# Patient Record
Sex: Male | Born: 1945
Health system: Southern US, Community
[De-identification: ages and names within clinical notes are randomized; demographics above are authoritative.]

## PROBLEM LIST (undated history)

## (undated) DIAGNOSIS — I10 Essential (primary) hypertension: Secondary | ICD-10-CM

## (undated) DIAGNOSIS — E785 Hyperlipidemia, unspecified: Secondary | ICD-10-CM

## (undated) DIAGNOSIS — C801 Malignant (primary) neoplasm, unspecified: Secondary | ICD-10-CM

## (undated) DIAGNOSIS — E119 Type 2 diabetes mellitus without complications: Secondary | ICD-10-CM

## (undated) HISTORY — PX: EYE SURGERY: SHX253

---

## 2015-09-05 DIAGNOSIS — E785 Hyperlipidemia, unspecified: Secondary | ICD-10-CM | POA: Diagnosis not present

## 2015-09-05 DIAGNOSIS — E1142 Type 2 diabetes mellitus with diabetic polyneuropathy: Secondary | ICD-10-CM | POA: Diagnosis not present

## 2015-09-05 DIAGNOSIS — N529 Male erectile dysfunction, unspecified: Secondary | ICD-10-CM | POA: Diagnosis not present

## 2015-09-05 DIAGNOSIS — I10 Essential (primary) hypertension: Secondary | ICD-10-CM | POA: Diagnosis not present

## 2016-03-15 DIAGNOSIS — K219 Gastro-esophageal reflux disease without esophagitis: Secondary | ICD-10-CM | POA: Diagnosis not present

## 2016-03-15 DIAGNOSIS — E119 Type 2 diabetes mellitus without complications: Secondary | ICD-10-CM | POA: Diagnosis not present

## 2016-03-15 DIAGNOSIS — E785 Hyperlipidemia, unspecified: Secondary | ICD-10-CM | POA: Diagnosis not present

## 2016-03-15 DIAGNOSIS — I10 Essential (primary) hypertension: Secondary | ICD-10-CM | POA: Diagnosis not present

## 2016-05-10 ENCOUNTER — Ambulatory Visit (HOSPITAL_COMMUNITY)
Admission: EM | Admit: 2016-05-10 | Discharge: 2016-05-10 | Disposition: A | Discharge status: Home / Self Care | Payer: Managed Care, Other (non HMO) | Attending: Internal Medicine | Admitting: Internal Medicine

## 2016-05-10 ENCOUNTER — Encounter (HOSPITAL_COMMUNITY): Payer: Self-pay | Admitting: *Deleted

## 2016-05-10 DIAGNOSIS — Z794 Long term (current) use of insulin: Secondary | ICD-10-CM

## 2016-05-10 DIAGNOSIS — E119 Type 2 diabetes mellitus without complications: Secondary | ICD-10-CM

## 2016-05-10 DIAGNOSIS — Z760 Encounter for issue of repeat prescription: Secondary | ICD-10-CM | POA: Diagnosis not present

## 2016-05-10 HISTORY — DX: Type 2 diabetes mellitus without complications: E11.9

## 2016-05-10 HISTORY — DX: Essential (primary) hypertension: I10

## 2016-05-10 MED ORDER — INSULIN ASPART 100 UNIT/ML ~~LOC~~ SOLN: 20.0000 [IU] | Freq: Three times a day (TID) | SUBCUTANEOUS | 3 refills | Status: DC

## 2016-05-10 MED ORDER — ENALAPRIL MALEATE 10 MG PO TABS: 10.0000 mg | ORAL_TABLET | Freq: Every day | ORAL | 3 refills | Status: DC

## 2016-05-10 MED ORDER — INSULIN DETEMIR 100 UNIT/ML ~~LOC~~ SOLN: 25.0000 [IU] | Freq: Every day | SUBCUTANEOUS | 3 refills | Status: DC

## 2016-05-10 NOTE — ED Triage Notes (Signed)
Pt   Reports      Symptoms  Of  Needing a  Medication  Refill      He recently  Relocated from  [place]    He  Needs   A     Refill  On  His  Insulin

## 2016-05-10 NOTE — ED Provider Notes (Signed)
[facility]    CSN: [id number] Arrival date & time: 05/10/16  1002     History   Chief Complaint Chief Complaint  Patient presents with  . Medication Refill    HPI Stephen Lynch is a 71 y.o. male.   Pt presents w/o complaints.  He is new to the area and has not established care with PMD yet.  Needs refills on BP meds and insulin.  Blood sugars always under 120 in the mornings (usually around 100).  Admits that he needs to cut out eating dessert late in the evening.  Reports recent A1c good.      Past Medical History:  Diagnosis Date  . Diabetes mellitus without complication ([place])   . Hypertension     There are no active problems to display for this patient.   History reviewed. No pertinent surgical history.     Home Medications    Prior to Admission medications   Medication Sig Start Date End Date Taking? Authorizing Provider  rosuvastatin (CRESTOR) 10 MG tablet Take 10 mg by mouth daily.   Yes Historical Provider, MD  enalapril (VASOTEC) 10 MG tablet Take 1 tablet (10 mg total) by mouth daily. 05/10/16   Harrie Foreman, MD  insulin aspart (NOVOLOG) 100 UNIT/ML injection Inject 20 Units into the skin 3 (three) times daily with meals. 05/10/16   Harrie Foreman, MD  insulin detemir (LEVEMIR) 100 UNIT/ML injection Inject 0.25 mLs (25 Units total) into the skin at bedtime. 05/10/16   Harrie Foreman, MD    Family History History reviewed. No pertinent family history.  Social History Social History  Substance Use Topics  . Smoking status: Never Smoker  . Smokeless tobacco: Not on file  . Alcohol use No     Allergies   Patient has no known allergies.   Review of Systems Review of Systems  Constitutional: Negative for chills and fever.  HENT: Negative for sore throat and tinnitus.   Eyes: Negative for redness.  Respiratory: Negative for cough and shortness of breath.   Cardiovascular: Negative for chest pain and palpitations.    Gastrointestinal: Negative for abdominal pain, diarrhea, nausea and vomiting.  Genitourinary: Negative for dysuria, frequency and urgency.  Musculoskeletal: Negative for myalgias.  Skin: Negative for rash.       No lesions  Neurological: Negative for weakness.  Hematological: Does not bruise/bleed easily.  Psychiatric/Behavioral: Negative for suicidal ideas.     Physical Exam Triage Vital Signs ED Triage Vitals  Enc Vitals Group     BP 05/10/16 1027 140/76     Pulse Rate 05/10/16 1027 78     Resp 05/10/16 1027 18     Temp 05/10/16 1027 98.6 F (37 C)     Temp Source 05/10/16 1027 Oral     SpO2 05/10/16 1027 100 %     Weight --      Height --      Head Circumference --      Peak Flow --      Pain Score 05/10/16 1137 0     Pain Loc --      Pain Edu? --      Excl. in [place]? --    No data found.   Updated Vital Signs BP 140/76 (BP Location: Right Arm)   Pulse 78   Temp 98.6 F (37 C) (Oral)   Resp 18   SpO2 100%   Visual Acuity Right Eye Distance:   Left Eye Distance:  Bilateral Distance:    Right Eye Near:   Left Eye Near:    Bilateral Near:     Physical Exam  Constitutional: He is oriented to person, place, and time. He appears well-developed and well-nourished. No distress.  HENT:  Head: Normocephalic and atraumatic.  Mouth/Throat: Oropharynx is clear and moist.  Eyes: Conjunctivae and EOM are normal. Pupils are equal, round, and reactive to light. No scleral icterus.  Neck: Normal range of motion. Neck supple. No JVD present. No tracheal deviation present. No thyromegaly present.  Cardiovascular: Normal rate, regular rhythm and normal heart sounds.  Exam reveals no gallop and no friction rub.   No murmur heard. Pulmonary/Chest: Effort normal and breath sounds normal. No respiratory distress.  Abdominal: Soft. Bowel sounds are normal. He exhibits no distension. There is no tenderness.  Musculoskeletal: Normal range of motion. He exhibits no edema.   Lymphadenopathy:    He has no cervical adenopathy.  Neurological: He is alert and oriented to person, place, and time. No cranial nerve deficit.  Skin: Skin is warm and dry. No rash noted. No erythema.  Psychiatric: He has a normal mood and affect. His behavior is normal. Judgment and thought content normal.     UC Treatments / Results  Labs (all labs ordered are listed, but only abnormal results are displayed) Labs Reviewed - No data to display  EKG  EKG Interpretation None       Radiology No results found.  Procedures Procedures (including critical care time)  Medications Ordered in UC Medications - No data to display   Initial Impression / Assessment and Plan / UC Course  I have reviewed the triage vital signs and the nursing notes.  Pertinent labs & imaging results that were available during my care of the patient were reviewed by me and considered in my medical decision making (see chart for details).     Refilled medications.  Urged pt to establish care soon.  Final Clinical Impressions(s) / UC Diagnoses   Final diagnoses:  Medication refill  Controlled type 2 diabetes mellitus without complication, with long-term current use of insulin ([place])    New Prescriptions Discharge Medication List as of 05/10/2016 11:36 AM    START taking these medications   Details  insulin detemir (LEVEMIR) 100 UNIT/ML injection Inject 0.25 mLs (25 Units total) into the skin at bedtime., Starting Fri 05/10/2016, Normal         Harrie Foreman, MD 05/24/16 1715

## 2016-06-05 ENCOUNTER — Encounter (HOSPITAL_COMMUNITY): Payer: Self-pay | Admitting: *Deleted

## 2016-06-05 ENCOUNTER — Ambulatory Visit (HOSPITAL_COMMUNITY)
Admission: EM | Admit: 2016-06-05 | Discharge: 2016-06-05 | Disposition: A | Discharge status: Home / Self Care | Payer: Managed Care, Other (non HMO) | Attending: Family Medicine | Admitting: Family Medicine

## 2016-06-05 DIAGNOSIS — Z760 Encounter for issue of repeat prescription: Secondary | ICD-10-CM

## 2016-06-05 MED ORDER — INSULIN ASPART 100 UNIT/ML ~~LOC~~ SOLN: 20.0000 [IU] | Freq: Three times a day (TID) | SUBCUTANEOUS | 3 refills | Status: DC

## 2016-06-05 MED ORDER — ROSUVASTATIN CALCIUM 10 MG PO TABS: 10.0000 mg | ORAL_TABLET | Freq: Every day | ORAL | 3 refills | Status: DC

## 2016-06-05 MED ORDER — INSULIN DETEMIR 100 UNIT/ML ~~LOC~~ SOLN: 25.0000 [IU] | Freq: Every day | SUBCUTANEOUS | 3 refills | Status: DC

## 2016-06-05 MED ORDER — ENALAPRIL MALEATE 10 MG PO TABS: 10.0000 mg | ORAL_TABLET | Freq: Every day | ORAL | 3 refills | Status: DC

## 2016-06-05 NOTE — ED Provider Notes (Signed)
CSN: [id number]     Arrival date & time 06/05/16  1151 History   First MD Initiated Contact with Patient 06/05/16 1250     Chief Complaint  Patient presents with  . Medication Refill   (Consider location/radiation/quality/duration/timing/severity/associated sxs/prior Treatment) 71 year old male presents to clinic for a chief complaint of needing medicine refilled. He hasn't no acute complaints or any physical signs or symptoms of illness. He was last seen on March 30, and at that visit his insulin and blood pressure medicines were refilled. Today's presenting for request of having his cholesterol medicine refilled. He is relatively new to the area moving unit from [place], and has had some difficulty finding a primary care provider. He is long established on these medicines, with no known allergies, reactions, or other complaints. States his A1c was normal, and he has had no issues with hypoglycemia, low blood pressure, or myalgias.   The history is provided by the patient.  Medication Refill  Medications/supplies requested:  Crestor Reason for request:  Clinic/provider not available Medications taken before: yes - see home medications   Patient has complete original prescription information: no   Source of information:  Patient reported - no other verification available (medicine bottles)   Past Medical History:  Diagnosis Date  . Diabetes mellitus without complication ([place])   . Hypertension    History reviewed. No pertinent surgical history. History reviewed. No pertinent family history. Social History  Substance Use Topics  . Smoking status: Never Smoker  . Smokeless tobacco: Never Used  . Alcohol use No    Review of Systems  Constitutional: Negative for chills and fever.  HENT: Negative for congestion, sinus pain, sinus pressure and sore throat.   Eyes: Negative for discharge, itching and visual disturbance.  Respiratory: Negative for shortness of breath and wheezing.    Cardiovascular: Negative for chest pain and palpitations.  Gastrointestinal: Negative for diarrhea, nausea and vomiting.  Genitourinary: Negative for dysuria and frequency.  Musculoskeletal: Negative for myalgias, neck pain and neck stiffness.  Skin: Negative for rash and wound.  Neurological: Negative for dizziness, light-headedness and headaches.    Allergies  Patient has no known allergies.  Home Medications   Prior to Admission medications   Medication Sig Start Date End Date Taking? Authorizing Provider  enalapril (VASOTEC) 10 MG tablet Take 1 tablet (10 mg total) by mouth daily. 06/05/16   Barnet Glasgow, NP  insulin aspart (NOVOLOG) 100 UNIT/ML injection Inject 20 Units into the skin 3 (three) times daily with meals. 06/05/16   Barnet Glasgow, NP  insulin detemir (LEVEMIR) 100 UNIT/ML injection Inject 0.25 mLs (25 Units total) into the skin at bedtime. 06/05/16   Barnet Glasgow, NP  rosuvastatin (CRESTOR) 10 MG tablet Take 1 tablet (10 mg total) by mouth daily. 06/05/16   Barnet Glasgow, NP   Meds Ordered and Administered this Visit  Medications - No data to display  BP (!) 143/74 (BP Location: Left Arm)   Pulse 60   Temp 97.7 F (36.5 C) (Oral)   Resp 17   SpO2 100%  No data found.   Physical Exam  Constitutional: He is oriented to person, place, and time. He appears well-developed and well-nourished. No distress.  HENT:  Head: Normocephalic and atraumatic.  Right Ear: External ear normal.  Left Ear: External ear normal.  Eyes: Conjunctivae are normal. Right eye exhibits no discharge. Left eye exhibits no discharge.  Neck: Normal range of motion.  Cardiovascular: Normal rate and regular rhythm.   Pulmonary/Chest:  Effort normal and breath sounds normal.  Neurological: He is alert and oriented to person, place, and time.  Skin: Skin is warm and dry. Capillary refill takes less than 2 seconds. He is not diaphoretic.  Psychiatric: He has a normal mood and affect.  His behavior is normal.  Nursing note and vitals reviewed.   Urgent Care Course     Procedures (including critical care time)  Labs Review Labs Reviewed - No data to display  Imaging Review No results found.     MDM   1. Medication refill     Refills provided on his medicines, contact information was given to a clinic currently accepting new patients, and he was shown the number to call to establish with primary care. Strongly encouraged following up with primary care as soon as possible for the management of his condition.    Barnet Glasgow, NP 06/05/16 1410

## 2016-06-05 NOTE — ED Triage Notes (Addendum)
Patient just relocated to [facility] from [place], needs refill on his prescriptions for insulin and cholesterol medication. Would also like help on how to get another PCP. Patient has not complaints and states he is feeling good. Is almost out of insulin medication but has not been without. Patient has prescription refill for him ready for his BP medication.

## 2016-08-14 ENCOUNTER — Emergency Department (HOSPITAL_COMMUNITY)
Admission: EM | Admit: 2016-08-14 | Discharge: 2016-08-14 | Disposition: A | Discharge status: Home / Self Care | Payer: Medicare Other | Attending: Emergency Medicine | Admitting: Emergency Medicine

## 2016-08-14 ENCOUNTER — Encounter (HOSPITAL_COMMUNITY): Payer: Self-pay

## 2016-08-14 DIAGNOSIS — E119 Type 2 diabetes mellitus without complications: Secondary | ICD-10-CM | POA: Insufficient documentation

## 2016-08-14 DIAGNOSIS — I10 Essential (primary) hypertension: Secondary | ICD-10-CM | POA: Insufficient documentation

## 2016-08-14 DIAGNOSIS — Z79899 Other long term (current) drug therapy: Secondary | ICD-10-CM | POA: Insufficient documentation

## 2016-08-14 DIAGNOSIS — Z794 Long term (current) use of insulin: Secondary | ICD-10-CM | POA: Diagnosis not present

## 2016-08-14 DIAGNOSIS — R600 Localized edema: Secondary | ICD-10-CM | POA: Diagnosis present

## 2016-08-14 DIAGNOSIS — K047 Periapical abscess without sinus: Secondary | ICD-10-CM

## 2016-08-14 MED ORDER — HYDROCODONE-ACETAMINOPHEN 5-325 MG PO TABS: 1.0000 | ORAL_TABLET | ORAL | 0 refills | Status: DC | PRN

## 2016-08-14 MED ORDER — PENICILLIN V POTASSIUM 500 MG PO TABS: 500.0000 mg | ORAL_TABLET | Freq: Two times a day (BID) | ORAL | 0 refills | Status: AC

## 2016-08-14 NOTE — ED Triage Notes (Signed)
Patient complains of upper left dental pain with facial swelling x 3 days. Has ben taking ibuprofen with minimal relief

## 2016-08-14 NOTE — ED Provider Notes (Signed)
[facility] DEPT Provider Note   CSN: [id number] Arrival date & time: 08/14/16  1410     History   Chief Complaint Chief Complaint  Patient presents with  . dental abscess    HPI Stephen Lynch is a 71 y.o. male.  HPI   Patient is a 71 year old male with history of diabetes who presents the ED with complaint of dental abscess. Patient reports having gradually worsening pain and swelling to his left upper face. Denies fever, drainage, dysphagia, trismus, drooling, SOB, vomiting. Denies taking any medications at home for pain.   Past Medical History:  Diagnosis Date  . Diabetes mellitus without complication ([place])   . Hypertension     There are no active problems to display for this patient.   History reviewed. No pertinent surgical history.     Home Medications    Prior to Admission medications   Medication Sig Start Date End Date Taking? Authorizing Provider  enalapril (VASOTEC) 10 MG tablet Take 1 tablet (10 mg total) by mouth daily. 06/05/16   Barnet Glasgow, NP  HYDROcodone-acetaminophen (NORCO/VICODIN) 5-325 MG tablet Take 1 tablet by mouth every 4 (four) hours as needed. 08/14/16   Nona Dell, PA-C  insulin aspart (NOVOLOG) 100 UNIT/ML injection Inject 20 Units into the skin 3 (three) times daily with meals. 06/05/16   Barnet Glasgow, NP  insulin detemir (LEVEMIR) 100 UNIT/ML injection Inject 0.25 mLs (25 Units total) into the skin at bedtime. 06/05/16   Barnet Glasgow, NP  penicillin v potassium (VEETID) 500 MG tablet Take 1 tablet (500 mg total) by mouth 2 (two) times daily. 08/14/16 08/21/16  Nona Dell, PA-C  rosuvastatin (CRESTOR) 10 MG tablet Take 1 tablet (10 mg total) by mouth daily. 06/05/16   Barnet Glasgow, NP    Family History No family history on file.  Social History Social History  Substance Use Topics  . Smoking status: Never Smoker  . Smokeless tobacco: Never Used  . Alcohol use No     Allergies   Patient  has no known allergies.   Review of Systems Review of Systems  Constitutional: Negative for fever.  HENT: Positive for dental problem and facial swelling. Negative for trouble swallowing.   Respiratory: Negative for shortness of breath.   Gastrointestinal: Negative for vomiting.  Neurological: Negative for headaches.     Physical Exam Updated Vital Signs BP (!) 146/80   Pulse 71   Temp 98.2 F (36.8 C) (Oral)   Resp 16   Ht 6\' 1"  (1.854 m)   Wt 104.3 kg (230 lb)   SpO2 95%   BMI 30.34 kg/m   Physical Exam  Constitutional: He is oriented to person, place, and time. He appears well-developed and well-nourished.  HENT:  Head: Normocephalic and atraumatic.  Mouth/Throat: Uvula is midline, oropharynx is clear and moist and mucous membranes are normal. No trismus in the jaw. Abnormal dentition. Dental abscesses and dental caries present. No uvula swelling. No oropharyngeal exudate, posterior oropharyngeal edema, posterior oropharyngeal erythema or tonsillar abscesses. No tonsillar exudate.    Poor dentition throughout with mild swelling to left maxillary facial region. No trismus or drooling.   Eyes: Conjunctivae and EOM are normal. Right eye exhibits no discharge. Left eye exhibits no discharge. No scleral icterus.  Neck: Normal range of motion. Neck supple.  Cardiovascular: Normal rate and intact distal pulses.   Pulmonary/Chest: Effort normal.  Musculoskeletal: Normal range of motion.  Neurological: He is alert and oriented to person, place, and time.  Skin:  Skin is warm and dry.  Nursing note and vitals reviewed.    ED Treatments / Results  Labs (all labs ordered are listed, but only abnormal results are displayed) Labs Reviewed - No data to display  EKG  EKG Interpretation None       Radiology No results found.  Procedures Procedures (including critical care time)  Medications Ordered in ED Medications - No data to display   Initial Impression /  Assessment and Plan / ED Course  I have reviewed the triage vital signs and the nursing notes.  Pertinent labs & imaging results that were available during my care of the patient were reviewed by me and considered in my medical decision making (see chart for details).     Patient presented with dental abscess open and actively draining on exam. Denies fever, trismus, drooling, vomiting. VSS. No concern for Ludwig's angina or spread of infection. Plan to d/c pt home with penicillin, pain meds and NSAIDs along with outpatient dental referral. Discussed strict return precautions.   Final Clinical Impressions(s) / ED Diagnoses   Final diagnoses:  Dental abscess    New Prescriptions New Prescriptions   HYDROCODONE-ACETAMINOPHEN (NORCO/VICODIN) 5-325 MG TABLET    Take 1 tablet by mouth every 4 (four) hours as needed.   PENICILLIN V POTASSIUM (VEETID) 500 MG TABLET    Take 1 tablet (500 mg total) by mouth 2 (two) times daily.     Nona Dell, PA-C 08/14/16 [place], DO 08/14/16 1556

## 2016-08-14 NOTE — Discharge Instructions (Signed)
Take medications as prescribed. You may also take 600 mg ibuprofen 4 times daily as needed for pain relief. I recommend eating prior to taking ibuprofen to prevent gastrointestinal side effects. You may apply ice and/or heat to affected area for 15-20 minutes 3-4 times daily to help with pain. Follow-up with one of the dental clinics listed below for further management of your dental pain. Return to the emergency department if symptoms worsen or new onset of fever, headache, neck stiffness, facial/neck swelling, unable to open jaw, unable to swallow resulting in drooling, difficulty breathing, drainage, unable to tolerate fluids.   [facility] [address] [place], [place] [address] Phone [phone]  [facility] [place], [place], exemplifies the [employer]?s vision to improve the health and quality of life of all [place] by Regulatory affairs officer with a passion to care for the underserved and by leading the nation in community-based, service learning oral health education.  We are committed to offering comprehensive general dental services for adults, children and special needs patients in a safe, caring and professional setting.   Appointments: Our clinic is open Monday through Friday 8:00 a.m. until 5:00 p.m. The amount of time scheduled for an appointment depends on the patient?s specific needs. We ask that you keep your appointed time for care or provide 24-hour notice of all appointment changes. Parents or legal guardians must accompany minor children.   Payment for Services: Medicaid and other insurance plans are welcome. Payment for services is due when services are rendered and may be made by cash or credit card. If you have dental insurance, we will assist you with your claim submission.      Emergencies:  Emergency services will be provided Monday through Friday on a walk-in basis.  Please arrive early for emergency services. After hours emergency services will be provided for patients of record as required.   Services:  Comprehensive General Dentistry Children?s Dentistry Oral Surgery - Extractions Root Canals Sealants and Tooth Colored Fillings Crowns and Camera operator 3-D/[facility]

## 2016-08-26 ENCOUNTER — Encounter (HOSPITAL_COMMUNITY): Payer: Self-pay | Admitting: *Deleted

## 2016-08-26 ENCOUNTER — Ambulatory Visit (HOSPITAL_COMMUNITY)
Admission: EM | Admit: 2016-08-26 | Discharge: 2016-08-26 | Disposition: A | Discharge status: Home / Self Care | Payer: Managed Care, Other (non HMO) | Attending: Internal Medicine | Admitting: Internal Medicine

## 2016-08-26 DIAGNOSIS — Z760 Encounter for issue of repeat prescription: Secondary | ICD-10-CM | POA: Diagnosis not present

## 2016-08-26 DIAGNOSIS — E119 Type 2 diabetes mellitus without complications: Secondary | ICD-10-CM | POA: Diagnosis not present

## 2016-08-26 MED ORDER — INSULIN ASPART 100 UNIT/ML ~~LOC~~ SOLN: 20.0000 [IU] | Freq: Three times a day (TID) | SUBCUTANEOUS | 0 refills | Status: DC

## 2016-08-26 MED ORDER — INSULIN DETEMIR 100 UNIT/ML ~~LOC~~ SOLN: 25.0000 [IU] | Freq: Every day | SUBCUTANEOUS | 0 refills | Status: DC

## 2016-08-26 NOTE — ED Triage Notes (Signed)
Pt  Is  Requesting   A   Prescription  Refill  On  His  humulog  20   Units   3   X    A  Day  He  Has  A  Small  Amt  Left

## 2016-08-26 NOTE — Discharge Instructions (Signed)
You will need to establish PCP for further medication refills.

## 2016-08-26 NOTE — ED Provider Notes (Signed)
CSN: [id number]     Arrival date & time 08/26/16  1529 History   None    Chief Complaint  Patient presents with  . Medication Refill   (Consider location/radiation/quality/duration/timing/severity/associated sxs/prior Treatment) 71 year old male with PMH of diabetes, hypertension, hyperlipidemia comes in for medication refill. Patient states he moved down from [place], and has been having trouble establishing care at a PCP and endocrinologist. He states his endocrinologist back in [place] had switched locations, causing the difficulty of transferring files. He denies any symptoms such as chest pain, palpitations, shortness of breath, abdominal pain, N/V/D. States his blood sugars have been running 120-150s.       Past Medical History:  Diagnosis Date  . Diabetes mellitus without complication ([place])   . Hypertension    History reviewed. No pertinent surgical history. History reviewed. No pertinent family history. Social History  Substance Use Topics  . Smoking status: Never Smoker  . Smokeless tobacco: Never Used  . Alcohol use No    Review of Systems  Constitutional: Negative for chills, diaphoresis and fever.  Respiratory: Negative for cough, shortness of breath and wheezing.   Cardiovascular: Negative for chest pain, palpitations and leg swelling.  Gastrointestinal: Negative for abdominal pain, constipation, diarrhea, nausea and vomiting.  Endocrine: Negative for polydipsia, polyphagia and polyuria.    Allergies  Patient has no known allergies.  Home Medications   Prior to Admission medications   Medication Sig Start Date End Date Taking? Authorizing Provider  enalapril (VASOTEC) 10 MG tablet Take 1 tablet (10 mg total) by mouth daily. 06/05/16   Barnet Glasgow, NP  HYDROcodone-acetaminophen (NORCO/VICODIN) 5-325 MG tablet Take 1 tablet by mouth every 4 (four) hours as needed. 08/14/16   Nona Dell, PA-C  insulin aspart (NOVOLOG) 100 UNIT/ML injection Inject 20 Units  into the skin 3 (three) times daily with meals. 08/26/16 09/02/16  Tasia Catchings, Jeferson Boozer V, PA-C  insulin detemir (LEVEMIR) 100 UNIT/ML injection Inject 0.25 mLs (25 Units total) into the skin at bedtime. 08/26/16 09/02/16  Tasia Catchings, Mirta Mally V, PA-C  rosuvastatin (CRESTOR) 10 MG tablet Take 1 tablet (10 mg total) by mouth daily. 06/05/16   Barnet Glasgow, NP   Meds Ordered and Administered this Visit  Medications - No data to display  BP 136/66 (BP Location: Right Arm)   Pulse 63   Temp 98.6 F (37 C) (Oral)   Resp 18  No data found.   Physical Exam  Constitutional: He is oriented to person, place, and time. He appears well-developed and well-nourished. No distress.  HENT:  Head: Normocephalic and atraumatic.  Eyes: Pupils are equal, round, and reactive to light. Conjunctivae are normal.  Cardiovascular: Normal rate, regular rhythm and normal heart sounds.  Exam reveals no gallop and no friction rub.   No murmur heard. Pulmonary/Chest: Effort normal and breath sounds normal. He has no wheezes. He has no rales.  Neurological: He is alert and oriented to person, place, and time.  Skin: Skin is warm and dry.  Psychiatric: He has a normal mood and affect. His behavior is normal. Judgment normal.    Urgent Care Course     Procedures (including critical care time)  Labs Review Labs Reviewed - No data to display  Imaging Review No results found.        MDM   1. Medication refill    Upon review of records, patient has been seen twice for medication refill starting back in March. Discussed with patient that though I understand the difficulty  of finding an endocrinologist/PCP, medications he is taking need to be monitored, as well as lab work that needs to be done. As he has not had full DM workup for at least 4 months, will refill medication for 7 days. Additional resources for PCP given to patient. Patient expresses understanding and agrees to plan.    Ok Edwards, PA-C 08/26/16 1921

## 2016-09-25 ENCOUNTER — Encounter (HOSPITAL_COMMUNITY): Payer: Self-pay | Admitting: *Deleted

## 2016-09-25 ENCOUNTER — Encounter (HOSPITAL_COMMUNITY): Payer: Self-pay | Admitting: Family Medicine

## 2016-09-25 ENCOUNTER — Ambulatory Visit (HOSPITAL_COMMUNITY)
Admission: EM | Admit: 2016-09-25 | Discharge: 2016-09-25 | Disposition: A | Discharge status: Home / Self Care | Payer: Medicare Other | Attending: Family Medicine | Admitting: Family Medicine

## 2016-09-25 DIAGNOSIS — Z794 Long term (current) use of insulin: Secondary | ICD-10-CM

## 2016-09-25 DIAGNOSIS — E784 Other hyperlipidemia: Secondary | ICD-10-CM

## 2016-09-25 DIAGNOSIS — E119 Type 2 diabetes mellitus without complications: Secondary | ICD-10-CM

## 2016-09-25 DIAGNOSIS — I10 Essential (primary) hypertension: Secondary | ICD-10-CM

## 2016-09-25 DIAGNOSIS — E7849 Other hyperlipidemia: Secondary | ICD-10-CM

## 2016-09-25 DIAGNOSIS — Z760 Encounter for issue of repeat prescription: Secondary | ICD-10-CM | POA: Diagnosis not present

## 2016-09-25 MED ORDER — ENALAPRIL MALEATE 10 MG PO TABS: 10.0000 mg | ORAL_TABLET | Freq: Every day | ORAL | 3 refills | Status: DC

## 2016-09-25 MED ORDER — INSULIN DETEMIR 100 UNIT/ML FLEXPEN: 25.0000 [IU] | PEN_INJECTOR | Freq: Every day | SUBCUTANEOUS | 11 refills | Status: DC

## 2016-09-25 MED ORDER — ROSUVASTATIN CALCIUM 10 MG PO TABS: 10.0000 mg | ORAL_TABLET | Freq: Every day | ORAL | 3 refills | Status: AC

## 2016-09-25 MED ORDER — HUMALOG JUNIOR KWIKPEN 100 UNIT/ML ~~LOC~~ SOPN: 20.0000 [IU] | PEN_INJECTOR | Freq: Three times a day (TID) | SUBCUTANEOUS | 6 refills | Status: DC

## 2016-09-25 NOTE — ED Provider Notes (Signed)
[place]    CSN: [id number] Arrival date & time: 09/25/16  1059     History   Chief Complaint No chief complaint on file.   HPI Stephen Lynch is a 71 y.o. male.   This is a 71 year old gentleman who comes in because his right out of his medicine for diabetes, hypertension, and hyperlipidemia. He was seen last month and given one week of medication. He was instructed to find a personal care provider at the time of the last visit.  He does have an appointment on August 24 for new doctor. His blood sugars been running under 150. He prefers the pens for his Letemir and Humalog.      Past Medical History:  Diagnosis Date  . Diabetes mellitus without complication ([place])   . Hypertension     There are no active problems to display for this patient.   History reviewed. No pertinent surgical history.     Home Medications    Prior to Admission medications   Medication Sig Start Date End Date Taking? Authorizing Provider  enalapril (VASOTEC) 10 MG tablet Take 1 tablet (10 mg total) by mouth daily. 09/25/16   [name], MD  Insulin Detemir (LEVEMIR) 100 UNIT/ML Pen Inject 25 Units into the skin daily at 10 pm. 09/25/16   [name], MD  insulin lispro (HUMALOG) 100 UNIT/ML KwikPen Junior Inject 0.2 mLs (20 Units total) into the skin 3 (three) times daily after meals. 09/25/16   [name], MD  rosuvastatin (CRESTOR) 10 MG tablet Take 1 tablet (10 mg total) by mouth daily. 09/25/16   [name], MD    Family History No family history on file.  Social History Social History  Substance Use Topics  . Smoking status: Never Smoker  . Smokeless tobacco: Never Used  . Alcohol use No     Allergies   Patient has no known allergies.   Review of Systems Review of Systems  All other systems reviewed and are negative.    Physical Exam Triage Vital Signs ED Triage Vitals  Enc Vitals Group     BP      Pulse      Resp      Temp    Temp src      SpO2      Weight      Height      Head Circumference      Peak Flow      Pain Score      Pain Loc      Pain Edu?      Excl. in [place]?    No data found.   Updated Vital Signs There were no vitals taken for this visit.   Physical Exam  Constitutional: He is oriented to person, place, and time. He appears well-developed and well-nourished.  HENT:  Right Ear: External ear normal.  Left Ear: External ear normal.  Eyes: Pupils are equal, round, and reactive to light. Conjunctivae are normal.  Neck: Normal range of motion. Neck supple.  Pulmonary/Chest: Effort normal.  Musculoskeletal: Normal range of motion.  Neurological: He is alert and oriented to person, place, and time.  Skin: Skin is warm and dry.  Nursing note and vitals reviewed.    UC Treatments / Results  Labs (all labs ordered are listed, but only abnormal results are displayed) Labs Reviewed - No data to display  EKG  EKG Interpretation None       Radiology No results found.  Procedures Procedures (  including critical care time)  Medications Ordered in UC Medications - No data to display   Initial Impression / Assessment and Plan / UC Course  I have reviewed the triage vital signs and the nursing notes.  Pertinent labs & imaging results that were available during my care of the patient were reviewed by me and considered in my medical decision making (see chart for details).     Final Clinical Impressions(s) / UC Diagnoses   Final diagnoses:  Essential hypertension  Insulin dependent type 2 diabetes mellitus, controlled (HCC)  Other hyperlipidemia    New Prescriptions New Prescriptions   INSULIN DETEMIR (LEVEMIR) 100 UNIT/ML PEN    Inject 25 Units into the skin daily at 10 pm.   INSULIN LISPRO (HUMALOG) 100 UNIT/ML KWIKPEN JUNIOR    Inject 0.2 mLs (20 Units total) into the skin 3 (three) times daily after meals.     Controlled Substance Prescriptions [place] Controlled Substance  Registry consulted? Not Applicable   [name], MD 09/25/16 1136

## 2016-09-25 NOTE — ED Triage Notes (Signed)
Pt needed   A  Refill  Of  His  Prescription      meds    He  Is  Out

## 2016-10-04 DIAGNOSIS — Z6835 Body mass index (BMI) 35.0-35.9, adult: Secondary | ICD-10-CM | POA: Diagnosis not present

## 2016-10-04 DIAGNOSIS — I10 Essential (primary) hypertension: Secondary | ICD-10-CM | POA: Diagnosis not present

## 2016-10-11 DIAGNOSIS — M130 Polyarthritis, unspecified: Secondary | ICD-10-CM | POA: Diagnosis not present

## 2016-10-11 DIAGNOSIS — E119 Type 2 diabetes mellitus without complications: Secondary | ICD-10-CM | POA: Diagnosis not present

## 2016-10-11 DIAGNOSIS — I10 Essential (primary) hypertension: Secondary | ICD-10-CM | POA: Diagnosis not present

## 2016-10-11 DIAGNOSIS — E785 Hyperlipidemia, unspecified: Secondary | ICD-10-CM | POA: Diagnosis not present

## 2016-12-12 DIAGNOSIS — Z961 Presence of intraocular lens: Secondary | ICD-10-CM | POA: Diagnosis not present

## 2016-12-12 DIAGNOSIS — E119 Type 2 diabetes mellitus without complications: Secondary | ICD-10-CM | POA: Diagnosis not present

## 2016-12-17 DIAGNOSIS — E119 Type 2 diabetes mellitus without complications: Secondary | ICD-10-CM | POA: Diagnosis not present

## 2016-12-17 DIAGNOSIS — M130 Polyarthritis, unspecified: Secondary | ICD-10-CM | POA: Diagnosis not present

## 2016-12-17 DIAGNOSIS — E785 Hyperlipidemia, unspecified: Secondary | ICD-10-CM | POA: Diagnosis not present

## 2016-12-17 DIAGNOSIS — I10 Essential (primary) hypertension: Secondary | ICD-10-CM | POA: Diagnosis not present

## 2016-12-31 DIAGNOSIS — L601 Onycholysis: Secondary | ICD-10-CM | POA: Diagnosis not present

## 2016-12-31 DIAGNOSIS — E119 Type 2 diabetes mellitus without complications: Secondary | ICD-10-CM | POA: Diagnosis not present

## 2016-12-31 DIAGNOSIS — M130 Polyarthritis, unspecified: Secondary | ICD-10-CM | POA: Diagnosis not present

## 2016-12-31 DIAGNOSIS — I10 Essential (primary) hypertension: Secondary | ICD-10-CM | POA: Diagnosis not present

## 2017-03-31 DIAGNOSIS — E785 Hyperlipidemia, unspecified: Secondary | ICD-10-CM | POA: Diagnosis not present

## 2017-03-31 DIAGNOSIS — E119 Type 2 diabetes mellitus without complications: Secondary | ICD-10-CM | POA: Diagnosis not present

## 2017-03-31 DIAGNOSIS — I10 Essential (primary) hypertension: Secondary | ICD-10-CM | POA: Diagnosis not present

## 2017-03-31 DIAGNOSIS — L601 Onycholysis: Secondary | ICD-10-CM | POA: Diagnosis not present

## 2017-04-17 DIAGNOSIS — E118 Type 2 diabetes mellitus with unspecified complications: Secondary | ICD-10-CM | POA: Diagnosis not present

## 2017-04-17 DIAGNOSIS — I10 Essential (primary) hypertension: Secondary | ICD-10-CM | POA: Diagnosis not present

## 2017-04-24 DIAGNOSIS — E1165 Type 2 diabetes mellitus with hyperglycemia: Secondary | ICD-10-CM | POA: Diagnosis not present

## 2017-05-20 DIAGNOSIS — E1165 Type 2 diabetes mellitus with hyperglycemia: Secondary | ICD-10-CM | POA: Diagnosis not present

## 2017-05-20 DIAGNOSIS — I10 Essential (primary) hypertension: Secondary | ICD-10-CM | POA: Diagnosis not present

## 2017-06-10 DIAGNOSIS — E1165 Type 2 diabetes mellitus with hyperglycemia: Secondary | ICD-10-CM | POA: Diagnosis not present

## 2017-06-10 DIAGNOSIS — I10 Essential (primary) hypertension: Secondary | ICD-10-CM | POA: Diagnosis not present

## 2017-06-10 DIAGNOSIS — E785 Hyperlipidemia, unspecified: Secondary | ICD-10-CM | POA: Diagnosis not present

## 2017-07-10 DIAGNOSIS — M130 Polyarthritis, unspecified: Secondary | ICD-10-CM | POA: Diagnosis not present

## 2017-07-10 DIAGNOSIS — G250 Essential tremor: Secondary | ICD-10-CM | POA: Diagnosis not present

## 2017-07-10 DIAGNOSIS — E785 Hyperlipidemia, unspecified: Secondary | ICD-10-CM | POA: Diagnosis not present

## 2017-07-10 DIAGNOSIS — L601 Onycholysis: Secondary | ICD-10-CM | POA: Diagnosis not present

## 2017-07-10 DIAGNOSIS — R37 Sexual dysfunction, unspecified: Secondary | ICD-10-CM | POA: Diagnosis not present

## 2017-07-10 DIAGNOSIS — E118 Type 2 diabetes mellitus with unspecified complications: Secondary | ICD-10-CM | POA: Diagnosis not present

## 2017-08-01 DIAGNOSIS — E1165 Type 2 diabetes mellitus with hyperglycemia: Secondary | ICD-10-CM | POA: Diagnosis not present

## 2017-09-29 ENCOUNTER — Ambulatory Visit: Payer: Medicare Other | Admitting: Neurology

## 2017-12-24 DIAGNOSIS — I10 Essential (primary) hypertension: Secondary | ICD-10-CM | POA: Diagnosis not present

## 2017-12-24 DIAGNOSIS — Z6834 Body mass index (BMI) 34.0-34.9, adult: Secondary | ICD-10-CM | POA: Diagnosis not present

## 2017-12-24 DIAGNOSIS — E1165 Type 2 diabetes mellitus with hyperglycemia: Secondary | ICD-10-CM | POA: Diagnosis not present

## 2017-12-24 DIAGNOSIS — M130 Polyarthritis, unspecified: Secondary | ICD-10-CM | POA: Diagnosis not present

## 2018-01-27 DIAGNOSIS — I10 Essential (primary) hypertension: Secondary | ICD-10-CM | POA: Diagnosis not present

## 2018-01-27 DIAGNOSIS — M130 Polyarthritis, unspecified: Secondary | ICD-10-CM | POA: Diagnosis not present

## 2018-01-27 DIAGNOSIS — Z6835 Body mass index (BMI) 35.0-35.9, adult: Secondary | ICD-10-CM | POA: Diagnosis not present

## 2018-01-27 DIAGNOSIS — E785 Hyperlipidemia, unspecified: Secondary | ICD-10-CM | POA: Diagnosis not present

## 2018-01-27 DIAGNOSIS — L601 Onycholysis: Secondary | ICD-10-CM | POA: Diagnosis not present

## 2018-01-27 DIAGNOSIS — R37 Sexual dysfunction, unspecified: Secondary | ICD-10-CM | POA: Diagnosis not present

## 2018-01-27 DIAGNOSIS — E118 Type 2 diabetes mellitus with unspecified complications: Secondary | ICD-10-CM | POA: Diagnosis not present

## 2018-02-26 DIAGNOSIS — E119 Type 2 diabetes mellitus without complications: Secondary | ICD-10-CM | POA: Diagnosis not present

## 2018-02-26 DIAGNOSIS — Z961 Presence of intraocular lens: Secondary | ICD-10-CM | POA: Diagnosis not present

## 2018-03-30 DIAGNOSIS — E1100 Type 2 diabetes mellitus with hyperosmolarity without nonketotic hyperglycemic-hyperosmolar coma (NKHHC): Secondary | ICD-10-CM | POA: Diagnosis not present

## 2018-03-30 DIAGNOSIS — I10 Essential (primary) hypertension: Secondary | ICD-10-CM | POA: Diagnosis not present

## 2018-04-28 DIAGNOSIS — E785 Hyperlipidemia, unspecified: Secondary | ICD-10-CM | POA: Diagnosis not present

## 2018-04-28 DIAGNOSIS — E1165 Type 2 diabetes mellitus with hyperglycemia: Secondary | ICD-10-CM | POA: Diagnosis not present

## 2018-04-28 DIAGNOSIS — I10 Essential (primary) hypertension: Secondary | ICD-10-CM | POA: Diagnosis not present

## 2018-06-29 DIAGNOSIS — I10 Essential (primary) hypertension: Secondary | ICD-10-CM | POA: Diagnosis not present

## 2018-06-29 DIAGNOSIS — L601 Onycholysis: Secondary | ICD-10-CM | POA: Diagnosis not present

## 2018-06-29 DIAGNOSIS — E1169 Type 2 diabetes mellitus with other specified complication: Secondary | ICD-10-CM | POA: Diagnosis not present

## 2018-06-29 DIAGNOSIS — E785 Hyperlipidemia, unspecified: Secondary | ICD-10-CM | POA: Diagnosis not present

## 2018-06-29 DIAGNOSIS — Z6830 Body mass index (BMI) 30.0-30.9, adult: Secondary | ICD-10-CM | POA: Diagnosis not present

## 2018-07-07 DIAGNOSIS — Z6829 Body mass index (BMI) 29.0-29.9, adult: Secondary | ICD-10-CM | POA: Diagnosis not present

## 2018-07-07 DIAGNOSIS — E1169 Type 2 diabetes mellitus with other specified complication: Secondary | ICD-10-CM | POA: Diagnosis not present

## 2018-07-07 DIAGNOSIS — I10 Essential (primary) hypertension: Secondary | ICD-10-CM | POA: Diagnosis not present

## 2018-07-07 DIAGNOSIS — D6489 Other specified anemias: Secondary | ICD-10-CM | POA: Diagnosis not present

## 2018-07-07 DIAGNOSIS — D649 Anemia, unspecified: Secondary | ICD-10-CM | POA: Diagnosis not present

## 2018-07-07 DIAGNOSIS — E785 Hyperlipidemia, unspecified: Secondary | ICD-10-CM | POA: Diagnosis not present

## 2018-07-07 DIAGNOSIS — D529 Folate deficiency anemia, unspecified: Secondary | ICD-10-CM | POA: Diagnosis not present

## 2018-07-07 DIAGNOSIS — R9431 Abnormal electrocardiogram [ECG] [EKG]: Secondary | ICD-10-CM | POA: Diagnosis not present

## 2018-07-08 DIAGNOSIS — Z1211 Encounter for screening for malignant neoplasm of colon: Secondary | ICD-10-CM | POA: Diagnosis not present

## 2018-07-08 DIAGNOSIS — Z1212 Encounter for screening for malignant neoplasm of rectum: Secondary | ICD-10-CM | POA: Diagnosis not present

## 2018-07-13 ENCOUNTER — Other Ambulatory Visit: Payer: Self-pay | Admitting: Family Medicine

## 2018-07-13 DIAGNOSIS — C189 Malignant neoplasm of colon, unspecified: Secondary | ICD-10-CM

## 2018-07-22 DIAGNOSIS — I10 Essential (primary) hypertension: Secondary | ICD-10-CM | POA: Diagnosis not present

## 2018-07-22 DIAGNOSIS — E785 Hyperlipidemia, unspecified: Secondary | ICD-10-CM | POA: Diagnosis not present

## 2018-07-22 DIAGNOSIS — D6489 Other specified anemias: Secondary | ICD-10-CM | POA: Diagnosis not present

## 2018-07-27 DIAGNOSIS — E1165 Type 2 diabetes mellitus with hyperglycemia: Secondary | ICD-10-CM | POA: Diagnosis not present

## 2018-07-27 DIAGNOSIS — E782 Mixed hyperlipidemia: Secondary | ICD-10-CM | POA: Diagnosis not present

## 2018-07-27 DIAGNOSIS — R195 Other fecal abnormalities: Secondary | ICD-10-CM | POA: Diagnosis not present

## 2018-07-27 DIAGNOSIS — D509 Iron deficiency anemia, unspecified: Secondary | ICD-10-CM | POA: Diagnosis not present

## 2018-08-06 DIAGNOSIS — E782 Mixed hyperlipidemia: Secondary | ICD-10-CM | POA: Diagnosis not present

## 2018-08-06 DIAGNOSIS — J301 Allergic rhinitis due to pollen: Secondary | ICD-10-CM | POA: Diagnosis not present

## 2018-08-06 DIAGNOSIS — K219 Gastro-esophageal reflux disease without esophagitis: Secondary | ICD-10-CM | POA: Diagnosis not present

## 2018-08-12 DIAGNOSIS — Z1211 Encounter for screening for malignant neoplasm of colon: Secondary | ICD-10-CM | POA: Diagnosis not present

## 2018-08-12 DIAGNOSIS — R634 Abnormal weight loss: Secondary | ICD-10-CM | POA: Diagnosis not present

## 2018-08-12 DIAGNOSIS — R195 Other fecal abnormalities: Secondary | ICD-10-CM | POA: Diagnosis not present

## 2018-08-12 DIAGNOSIS — D122 Benign neoplasm of ascending colon: Secondary | ICD-10-CM | POA: Diagnosis not present

## 2018-08-12 DIAGNOSIS — K635 Polyp of colon: Secondary | ICD-10-CM | POA: Diagnosis not present

## 2018-08-12 DIAGNOSIS — D123 Benign neoplasm of transverse colon: Secondary | ICD-10-CM | POA: Diagnosis not present

## 2018-08-12 DIAGNOSIS — D124 Benign neoplasm of descending colon: Secondary | ICD-10-CM | POA: Diagnosis not present

## 2018-08-12 DIAGNOSIS — K921 Melena: Secondary | ICD-10-CM | POA: Diagnosis not present

## 2018-08-12 DIAGNOSIS — K319 Disease of stomach and duodenum, unspecified: Secondary | ICD-10-CM | POA: Diagnosis not present

## 2018-08-12 DIAGNOSIS — D125 Benign neoplasm of sigmoid colon: Secondary | ICD-10-CM | POA: Diagnosis not present

## 2018-08-12 DIAGNOSIS — K259 Gastric ulcer, unspecified as acute or chronic, without hemorrhage or perforation: Secondary | ICD-10-CM | POA: Diagnosis not present

## 2018-08-12 DIAGNOSIS — D509 Iron deficiency anemia, unspecified: Secondary | ICD-10-CM | POA: Diagnosis not present

## 2018-08-20 DIAGNOSIS — E785 Hyperlipidemia, unspecified: Secondary | ICD-10-CM | POA: Diagnosis not present

## 2018-08-20 DIAGNOSIS — D529 Folate deficiency anemia, unspecified: Secondary | ICD-10-CM | POA: Diagnosis not present

## 2018-08-20 DIAGNOSIS — I10 Essential (primary) hypertension: Secondary | ICD-10-CM | POA: Diagnosis not present

## 2018-08-20 DIAGNOSIS — Z6828 Body mass index (BMI) 28.0-28.9, adult: Secondary | ICD-10-CM | POA: Diagnosis not present

## 2018-08-20 DIAGNOSIS — R634 Abnormal weight loss: Secondary | ICD-10-CM | POA: Diagnosis not present

## 2018-08-20 DIAGNOSIS — E1169 Type 2 diabetes mellitus with other specified complication: Secondary | ICD-10-CM | POA: Diagnosis not present

## 2018-08-20 DIAGNOSIS — R636 Underweight: Secondary | ICD-10-CM | POA: Diagnosis not present

## 2018-08-20 DIAGNOSIS — D649 Anemia, unspecified: Secondary | ICD-10-CM | POA: Diagnosis not present

## 2018-08-26 DIAGNOSIS — D509 Iron deficiency anemia, unspecified: Secondary | ICD-10-CM | POA: Diagnosis not present

## 2018-08-26 DIAGNOSIS — R634 Abnormal weight loss: Secondary | ICD-10-CM | POA: Diagnosis not present

## 2018-09-03 DIAGNOSIS — Z794 Long term (current) use of insulin: Secondary | ICD-10-CM | POA: Diagnosis not present

## 2018-09-03 DIAGNOSIS — E1169 Type 2 diabetes mellitus with other specified complication: Secondary | ICD-10-CM | POA: Diagnosis not present

## 2018-09-03 DIAGNOSIS — R634 Abnormal weight loss: Secondary | ICD-10-CM | POA: Diagnosis not present

## 2018-09-17 DIAGNOSIS — M130 Polyarthritis, unspecified: Secondary | ICD-10-CM | POA: Diagnosis not present

## 2018-09-17 DIAGNOSIS — E785 Hyperlipidemia, unspecified: Secondary | ICD-10-CM | POA: Diagnosis not present

## 2018-09-17 DIAGNOSIS — E1165 Type 2 diabetes mellitus with hyperglycemia: Secondary | ICD-10-CM | POA: Diagnosis not present

## 2018-09-17 DIAGNOSIS — I10 Essential (primary) hypertension: Secondary | ICD-10-CM | POA: Diagnosis not present

## 2018-10-16 DIAGNOSIS — R1032 Left lower quadrant pain: Secondary | ICD-10-CM | POA: Diagnosis not present

## 2018-11-18 DIAGNOSIS — Z6825 Body mass index (BMI) 25.0-25.9, adult: Secondary | ICD-10-CM | POA: Diagnosis not present

## 2018-11-18 DIAGNOSIS — E1169 Type 2 diabetes mellitus with other specified complication: Secondary | ICD-10-CM | POA: Diagnosis not present

## 2018-11-18 DIAGNOSIS — I10 Essential (primary) hypertension: Secondary | ICD-10-CM | POA: Diagnosis not present

## 2018-11-27 DIAGNOSIS — Z23 Encounter for immunization: Secondary | ICD-10-CM | POA: Diagnosis not present

## 2018-11-27 DIAGNOSIS — E1165 Type 2 diabetes mellitus with hyperglycemia: Secondary | ICD-10-CM | POA: Diagnosis not present

## 2018-11-30 DIAGNOSIS — R739 Hyperglycemia, unspecified: Secondary | ICD-10-CM | POA: Diagnosis not present

## 2018-11-30 DIAGNOSIS — E1169 Type 2 diabetes mellitus with other specified complication: Secondary | ICD-10-CM | POA: Diagnosis not present

## 2018-12-03 DIAGNOSIS — Z0000 Encounter for general adult medical examination without abnormal findings: Secondary | ICD-10-CM | POA: Diagnosis not present

## 2018-12-08 DIAGNOSIS — E1169 Type 2 diabetes mellitus with other specified complication: Secondary | ICD-10-CM | POA: Diagnosis not present

## 2018-12-11 DIAGNOSIS — E1165 Type 2 diabetes mellitus with hyperglycemia: Secondary | ICD-10-CM | POA: Diagnosis not present

## 2019-01-14 DIAGNOSIS — R634 Abnormal weight loss: Secondary | ICD-10-CM | POA: Diagnosis not present

## 2019-01-14 DIAGNOSIS — I10 Essential (primary) hypertension: Secondary | ICD-10-CM | POA: Diagnosis not present

## 2019-01-14 DIAGNOSIS — E785 Hyperlipidemia, unspecified: Secondary | ICD-10-CM | POA: Diagnosis not present

## 2019-01-14 DIAGNOSIS — M130 Polyarthritis, unspecified: Secondary | ICD-10-CM | POA: Diagnosis not present

## 2019-02-26 DIAGNOSIS — M5136 Other intervertebral disc degeneration, lumbar region: Secondary | ICD-10-CM | POA: Diagnosis not present

## 2019-02-26 DIAGNOSIS — M5030 Other cervical disc degeneration, unspecified cervical region: Secondary | ICD-10-CM | POA: Diagnosis not present

## 2019-03-11 DIAGNOSIS — I10 Essential (primary) hypertension: Secondary | ICD-10-CM | POA: Diagnosis not present

## 2019-03-11 DIAGNOSIS — E1169 Type 2 diabetes mellitus with other specified complication: Secondary | ICD-10-CM | POA: Diagnosis not present

## 2019-03-11 DIAGNOSIS — E785 Hyperlipidemia, unspecified: Secondary | ICD-10-CM | POA: Diagnosis not present

## 2019-03-23 ENCOUNTER — Other Ambulatory Visit: Payer: Self-pay

## 2019-03-23 ENCOUNTER — Encounter (HOSPITAL_COMMUNITY): Payer: Self-pay | Admitting: Emergency Medicine

## 2019-03-23 ENCOUNTER — Inpatient Hospital Stay (HOSPITAL_COMMUNITY)
Admission: EM | Admit: 2019-03-23 | Discharge: 2019-03-25 | DRG: 542 | Disposition: A | Discharge status: Home Health | Payer: Medicare Other | Attending: Internal Medicine | Admitting: Internal Medicine

## 2019-03-23 ENCOUNTER — Emergency Department (HOSPITAL_COMMUNITY): Payer: Medicare Other

## 2019-03-23 ENCOUNTER — Inpatient Hospital Stay (HOSPITAL_COMMUNITY): Payer: Medicare Other

## 2019-03-23 DIAGNOSIS — D630 Anemia in neoplastic disease: Secondary | ICD-10-CM | POA: Diagnosis present

## 2019-03-23 DIAGNOSIS — M899 Disorder of bone, unspecified: Secondary | ICD-10-CM

## 2019-03-23 DIAGNOSIS — M898X8 Other specified disorders of bone, other site: Secondary | ICD-10-CM | POA: Diagnosis not present

## 2019-03-23 DIAGNOSIS — D649 Anemia, unspecified: Secondary | ICD-10-CM | POA: Diagnosis not present

## 2019-03-23 DIAGNOSIS — C61 Malignant neoplasm of prostate: Secondary | ICD-10-CM | POA: Diagnosis present

## 2019-03-23 DIAGNOSIS — M8458XA Pathological fracture in neoplastic disease, other specified site, initial encounter for fracture: Secondary | ICD-10-CM | POA: Diagnosis present

## 2019-03-23 DIAGNOSIS — I10 Essential (primary) hypertension: Secondary | ICD-10-CM | POA: Diagnosis not present

## 2019-03-23 DIAGNOSIS — M549 Dorsalgia, unspecified: Secondary | ICD-10-CM | POA: Diagnosis present

## 2019-03-23 DIAGNOSIS — M542 Cervicalgia: Secondary | ICD-10-CM | POA: Diagnosis not present

## 2019-03-23 DIAGNOSIS — Z87891 Personal history of nicotine dependence: Secondary | ICD-10-CM | POA: Diagnosis not present

## 2019-03-23 DIAGNOSIS — K839 Disease of biliary tract, unspecified: Secondary | ICD-10-CM | POA: Diagnosis present

## 2019-03-23 DIAGNOSIS — C7972 Secondary malignant neoplasm of left adrenal gland: Secondary | ICD-10-CM | POA: Diagnosis present

## 2019-03-23 DIAGNOSIS — M545 Low back pain: Secondary | ICD-10-CM

## 2019-03-23 DIAGNOSIS — G8929 Other chronic pain: Secondary | ICD-10-CM | POA: Diagnosis not present

## 2019-03-23 DIAGNOSIS — R52 Pain, unspecified: Secondary | ICD-10-CM | POA: Diagnosis not present

## 2019-03-23 DIAGNOSIS — R531 Weakness: Secondary | ICD-10-CM | POA: Diagnosis not present

## 2019-03-23 DIAGNOSIS — K838 Other specified diseases of biliary tract: Secondary | ICD-10-CM | POA: Diagnosis not present

## 2019-03-23 DIAGNOSIS — R64 Cachexia: Secondary | ICD-10-CM | POA: Diagnosis present

## 2019-03-23 DIAGNOSIS — E785 Hyperlipidemia, unspecified: Secondary | ICD-10-CM | POA: Diagnosis present

## 2019-03-23 DIAGNOSIS — J449 Chronic obstructive pulmonary disease, unspecified: Secondary | ICD-10-CM | POA: Diagnosis present

## 2019-03-23 DIAGNOSIS — U071 COVID-19: Secondary | ICD-10-CM

## 2019-03-23 DIAGNOSIS — Z7982 Long term (current) use of aspirin: Secondary | ICD-10-CM | POA: Diagnosis not present

## 2019-03-23 DIAGNOSIS — R410 Disorientation, unspecified: Secondary | ICD-10-CM | POA: Diagnosis not present

## 2019-03-23 DIAGNOSIS — M8448XA Pathological fracture, other site, initial encounter for fracture: Secondary | ICD-10-CM | POA: Diagnosis not present

## 2019-03-23 DIAGNOSIS — M546 Pain in thoracic spine: Secondary | ICD-10-CM | POA: Diagnosis not present

## 2019-03-23 DIAGNOSIS — Z6823 Body mass index (BMI) 23.0-23.9, adult: Secondary | ICD-10-CM | POA: Diagnosis not present

## 2019-03-23 DIAGNOSIS — E119 Type 2 diabetes mellitus without complications: Secondary | ICD-10-CM

## 2019-03-23 DIAGNOSIS — C7951 Secondary malignant neoplasm of bone: Principal | ICD-10-CM | POA: Diagnosis present

## 2019-03-23 DIAGNOSIS — Z79899 Other long term (current) drug therapy: Secondary | ICD-10-CM | POA: Diagnosis not present

## 2019-03-23 DIAGNOSIS — Z794 Long term (current) use of insulin: Secondary | ICD-10-CM | POA: Diagnosis not present

## 2019-03-23 HISTORY — DX: Hyperlipidemia, unspecified: E78.5

## 2019-03-23 LAB — POC OCCULT BLOOD, ED: Fecal Occult Bld: NEGATIVE

## 2019-03-23 LAB — COMPREHENSIVE METABOLIC PANEL
ALT: 22 U/L (ref 0–44)
AST: 13 U/L — ABNORMAL LOW (ref 15–41)
Albumin: 2.2 g/dL — ABNORMAL LOW (ref 3.5–5.0)
Alkaline Phosphatase: 375 U/L — ABNORMAL HIGH (ref 38–126)
Anion gap: 14 (ref 5–15)
BUN: 16 mg/dL (ref 8–23)
CO2: 25 mmol/L (ref 22–32)
Calcium: 8.5 mg/dL — ABNORMAL LOW (ref 8.9–10.3)
Chloride: 93 mmol/L — ABNORMAL LOW (ref 98–111)
Creatinine, Ser: 0.88 mg/dL (ref 0.61–1.24)
GFR calc Af Amer: >60 mL/min (ref >60)
GFR calc non Af Amer: >60 mL/min (ref >60)
Glucose, Bld: 358 mg/dL — ABNORMAL HIGH (ref 70–99)
Potassium: 4.1 mmol/L (ref 3.5–5.1)
Sodium: 132 mmol/L — ABNORMAL LOW (ref 135–145)
Total Bilirubin: 0.7 mg/dL (ref 0.3–1.2)
Total Protein: 6.8 g/dL (ref 6.5–8.1)

## 2019-03-23 LAB — URINALYSIS, ROUTINE W REFLEX MICROSCOPIC
Bilirubin Urine: NEGATIVE
Glucose, UA: >=500 mg/dL — AB
Hgb urine dipstick: NEGATIVE
Ketones, ur: 5 mg/dL — AB
Leukocytes,Ua: NEGATIVE
Nitrite: NEGATIVE
Protein, ur: NEGATIVE mg/dL
Specific Gravity, Urine: 1.02 (ref 1.005–1.030)
pH: 5 (ref 5.0–8.0)

## 2019-03-23 LAB — CBC WITH DIFFERENTIAL/PLATELET
Abs Immature Granulocytes: 0.16 10*3/uL — ABNORMAL HIGH (ref 0.00–0.07)
Basophils Absolute: 0 10*3/uL (ref 0.0–0.1)
Basophils Relative: 0 %
Eosinophils Absolute: 0 10*3/uL (ref 0.0–0.5)
Eosinophils Relative: 0 %
HCT: 20.8 % — ABNORMAL LOW (ref 39.0–52.0)
Hemoglobin: 6.1 g/dL — CL (ref 13.0–17.0)
Immature Granulocytes: 2 %
Lymphocytes Relative: 13 %
Lymphs Abs: 1.1 10*3/uL (ref 0.7–4.0)
MCH: 25.7 pg — ABNORMAL LOW (ref 26.0–34.0)
MCHC: 29.3 g/dL — ABNORMAL LOW (ref 30.0–36.0)
MCV: 87.8 fL (ref 80.0–100.0)
Monocytes Absolute: 0.9 10*3/uL (ref 0.1–1.0)
Monocytes Relative: 11 %
Neutro Abs: 6.3 10*3/uL (ref 1.7–7.7)
Neutrophils Relative %: 74 %
Platelets: 353 10*3/uL (ref 150–400)
RBC: 2.37 MIL/uL — ABNORMAL LOW (ref 4.22–5.81)
RDW: 22.6 % — ABNORMAL HIGH (ref 11.5–15.5)
WBC: 8.5 10*3/uL (ref 4.0–10.5)
nRBC: 0.8 % — ABNORMAL HIGH (ref 0.0–0.2)

## 2019-03-23 LAB — CBG MONITORING, ED: Glucose-Capillary: 219 mg/dL — ABNORMAL HIGH (ref 70–99)

## 2019-03-23 LAB — SEDIMENTATION RATE: Sed Rate: >140 mm/hr — ABNORMAL HIGH (ref 0–16)

## 2019-03-23 LAB — SARS CORONAVIRUS 2 (TAT 6-24 HRS): SARS Coronavirus 2: POSITIVE — AB

## 2019-03-23 LAB — PREPARE RBC (CROSSMATCH)

## 2019-03-23 MED ORDER — INSULIN ASPART 100 UNIT/ML ~~LOC~~ SOLN
10.0000 [IU] | Freq: Once | SUBCUTANEOUS | Status: AC
  Administered 2019-03-23: 12:00:00 10 [IU] via SUBCUTANEOUS

## 2019-03-23 MED ORDER — DOCUSATE SODIUM 100 MG PO CAPS
100.0000 mg | ORAL_CAPSULE | Freq: Two times a day (BID) | ORAL | Status: DC
  Administered 2019-03-24 – 2019-03-25 (×3): 100 mg via ORAL
  Filled 2019-03-23 (×3): qty 1

## 2019-03-23 MED ORDER — IOHEXOL 300 MG/ML  SOLN
100.0000 mL | Freq: Once | INTRAMUSCULAR | Status: AC | PRN
  Administered 2019-03-23: 100 mL via INTRAVENOUS

## 2019-03-23 MED ORDER — ONDANSETRON HCL 4 MG PO TABS: 4.0000 mg | ORAL_TABLET | Freq: Four times a day (QID) | ORAL | Status: DC | PRN

## 2019-03-23 MED ORDER — ACETAMINOPHEN 325 MG PO TABS
650.0000 mg | ORAL_TABLET | Freq: Four times a day (QID) | ORAL | Status: DC | PRN
  Administered 2019-03-25: 17:00:00 650 mg via ORAL
  Filled 2019-03-23: qty 2

## 2019-03-23 MED ORDER — MORPHINE SULFATE (PF) 2 MG/ML IV SOLN
2.0000 mg | INTRAVENOUS | Status: DC | PRN
  Administered 2019-03-23 – 2019-03-24 (×2): 2 mg via INTRAVENOUS
  Filled 2019-03-23 (×2): qty 1

## 2019-03-23 MED ORDER — LIDOCAINE 5 % EX PTCH
3.0000 | MEDICATED_PATCH | CUTANEOUS | Status: DC
  Administered 2019-03-23 – 2019-03-24 (×2): 3 via TRANSDERMAL
  Filled 2019-03-23 (×4): qty 3

## 2019-03-23 MED ORDER — ROSUVASTATIN CALCIUM 10 MG PO TABS
10.0000 mg | ORAL_TABLET | Freq: Every day | ORAL | Status: DC
  Administered 2019-03-24 – 2019-03-25 (×2): 10 mg via ORAL
  Filled 2019-03-23 (×2): qty 1

## 2019-03-23 MED ORDER — ONDANSETRON HCL 4 MG/2ML IJ SOLN: 4.0000 mg | Freq: Four times a day (QID) | INTRAMUSCULAR | Status: DC | PRN

## 2019-03-23 MED ORDER — SODIUM CHLORIDE 0.9% IV SOLUTION: Freq: Once | INTRAVENOUS | Status: AC

## 2019-03-23 MED ORDER — TRAMADOL HCL 50 MG PO TABS
50.0000 mg | ORAL_TABLET | Freq: Four times a day (QID) | ORAL | Status: DC | PRN
  Administered 2019-03-24 – 2019-03-25 (×2): 50 mg via ORAL
  Filled 2019-03-23 (×2): qty 1

## 2019-03-23 MED ORDER — INSULIN GLARGINE 100 UNIT/ML ~~LOC~~ SOLN
10.0000 [IU] | Freq: Every day | SUBCUTANEOUS | Status: DC
  Administered 2019-03-23 – 2019-03-24 (×2): 10 [IU] via SUBCUTANEOUS
  Filled 2019-03-23 (×3): qty 0.1

## 2019-03-23 MED ORDER — HYDROCODONE-ACETAMINOPHEN 5-325 MG PO TABS
1.0000 | ORAL_TABLET | Freq: Once | ORAL | Status: AC
  Administered 2019-03-23: 12:00:00 1 via ORAL
  Filled 2019-03-23: qty 1

## 2019-03-23 MED ORDER — ENOXAPARIN SODIUM 40 MG/0.4ML ~~LOC~~ SOLN
40.0000 mg | SUBCUTANEOUS | Status: DC
  Administered 2019-03-24: 40 mg via SUBCUTANEOUS
  Filled 2019-03-23: qty 0.4

## 2019-03-23 MED ORDER — INSULIN ASPART 100 UNIT/ML ~~LOC~~ SOLN
0.0000 [IU] | Freq: Three times a day (TID) | SUBCUTANEOUS | Status: DC
  Administered 2019-03-23: 18:00:00 3 [IU] via SUBCUTANEOUS
  Administered 2019-03-24: 5 [IU] via SUBCUTANEOUS
  Administered 2019-03-24: 3 [IU] via SUBCUTANEOUS
  Administered 2019-03-24 – 2019-03-25 (×2): 5 [IU] via SUBCUTANEOUS
  Administered 2019-03-25: 10:00:00 3 [IU] via SUBCUTANEOUS

## 2019-03-23 MED ORDER — ACETAMINOPHEN 650 MG RE SUPP: 650.0000 mg | Freq: Four times a day (QID) | RECTAL | Status: DC | PRN

## 2019-03-23 NOTE — ED Provider Notes (Signed)
[facility] EMERGENCY DEPARTMENT Provider Note   CSN: [id number] Arrival date & time: 03/23/19  0941     History Chief Complaint  Patient presents with  . Back Pain    Stephen Lynch is a 74 y.o. male with history of insulin dependent DM, HTN, HLD who presents with back pain. Pt states that over the past 10 months he's had gradually worsening pain in his neck, shoulders, upper and lower back. Seems to be more right sided. He has been taking Tylenol and Ibuprofen for pain but the pain has been worsening and so he decided to come to the ED today for pain medication. He also notes he has lost a significant amount of weight - he states ~100 pounds over the past year. Pt was last evaluated here in 2018 at [facility] and weight was 104.3 kg. He reports poor oral intake. He is unsure of why but thinks it might be due to the medicine he's taking. He states his PCP has evaluated him for this problem and he's seen orthopedics and had xrays done last week which showed arthritis. He has had a colonoscopy as well in July by Dr. Collene Mares which showed a polyp. His fiancee [name] states he's been having difficulty getting out of bed without assistance and his balance is off and he is having a new tremor. He has also been more confused than normal. Been having urinary frequency as well. Filled Tramadol on 1/15 for 5 day supply - otherwise no new recent scripts. He reports about 20 year history of 1 pack a day smoking - quit like 10 years ago.  PCP: Dr. Criss Rosales  HPI     Past Medical History:  Diagnosis Date  . Diabetes mellitus without complication ([place])   . Hypertension     There are no problems to display for this patient.   History reviewed. No pertinent surgical history.     No family history on file.  Social History   Tobacco Use  . Smoking status: Never Smoker  . Smokeless tobacco: Never Used  Substance Use Topics  . Alcohol use: No  . Drug use: Not on file    Home  Medications Prior to Admission medications   Medication Sig Start Date End Date Taking? Authorizing Provider  ASPIRIN 81 PO Take by mouth.    [provider]  enalapril (VASOTEC) 10 MG tablet Take 1 tablet (10 mg total) by mouth daily. 09/25/16   [name], MD  Enalapril Maleate (VASOTEC PO) Take by mouth.    [provider]  insulin detemir (LEVEMIR) 100 UNIT/ML injection Inject into the skin at bedtime.    [provider]  Insulin Detemir (LEVEMIR) 100 UNIT/ML Pen Inject 25 Units into the skin daily at 10 pm. 09/25/16   [name], MD  insulin lispro (HUMALOG) 100 UNIT/ML KiwkPen Inject into the skin.    [provider]  insulin lispro (HUMALOG) 100 UNIT/ML KwikPen Junior Inject 0.2 mLs (20 Units total) into the skin 3 (three) times daily after meals. 09/25/16   [name], MD  rosuvastatin (CRESTOR) 10 MG tablet Take 1 tablet (10 mg total) by mouth daily. 09/25/16   [name], MD  Rosuvastatin Calcium (CRESTOR PO) Take by mouth.    [provider]    Allergies    Patient has no known allergies.  Review of Systems   Review of Systems  Constitutional: Positive for appetite change and unexpected weight change.  Respiratory: Negative for shortness of breath.  Cardiovascular: Negative for chest pain.  Gastrointestinal: Negative for abdominal pain.  Genitourinary: Negative for dysuria.  Musculoskeletal: Positive for arthralgias, back pain, myalgias and neck pain.  Skin: Negative for wound.  Neurological: Positive for tremors. Negative for weakness.  All other systems reviewed and are negative.   Physical Exam Updated Vital Signs BP (!) 124/58 (BP Location: Right Arm)   Pulse 90   Temp 99 F (37.2 C) (Oral)   Resp 17   SpO2 97%   Physical Exam Vitals and nursing note reviewed.  Constitutional:      General: He is not in acute distress.    Appearance: Normal appearance. He is well-developed. He is not  ill-appearing.     Comments: Elderly male in NAD  HENT:     Head: Normocephalic and atraumatic.  Eyes:     General: No scleral icterus.       Right eye: No discharge.        Left eye: No discharge.     Conjunctiva/sclera: Conjunctivae normal.     Pupils: Pupils are equal, round, and reactive to light.  Cardiovascular:     Rate and Rhythm: Normal rate and regular rhythm.  Pulmonary:     Effort: Pulmonary effort is normal. No respiratory distress.     Breath sounds: Normal breath sounds.  Abdominal:     General: There is no distension.     Palpations: Abdomen is soft.     Tenderness: There is no abdominal tenderness.  Genitourinary:    Comments: Rectal: No gross blood, hemorrhoids, fissures, redness, area of fluctuance, lesions, or tenderness. Chaperone present during exam.  Musculoskeletal:     Cervical back: Normal range of motion.     Comments: Diffuse tenderness over the neck, mid, low back. Pain is mostly over the muscles and not midline spinal tenderness  Skin:    General: Skin is warm and dry.  Neurological:     Mental Status: He is alert and oriented to person, place, and time.  Psychiatric:        Behavior: Behavior normal.     ED Results / Procedures / Treatments   Labs (all labs ordered are listed, but only abnormal results are displayed) Labs Reviewed  COMPREHENSIVE METABOLIC PANEL - Abnormal; Notable for the following components:      Result Value   Sodium 132 (*)    Chloride 93 (*)    Glucose, Bld 358 (*)    Calcium 8.5 (*)    Albumin 2.2 (*)    AST 13 (*)    Alkaline Phosphatase 375 (*)    All other components within normal limits  CBC WITH DIFFERENTIAL/PLATELET - Abnormal; Notable for the following components:   RBC 2.37 (*)    Hemoglobin 6.1 (*)    HCT 20.8 (*)    MCH 25.7 (*)    MCHC 29.3 (*)    RDW 22.6 (*)    nRBC 0.8 (*)    Abs Immature Granulocytes 0.16 (*)    All other components within normal limits  URINALYSIS, ROUTINE W REFLEX  MICROSCOPIC - Abnormal; Notable for the following components:   APPearance HAZY (*)    Glucose, UA >=500 (*)    Ketones, ur 5 (*)    Bacteria, UA FEW (*)    All other components within normal limits  SARS CORONAVIRUS 2 (TAT 6-24 HRS)  SEDIMENTATION RATE  C-REACTIVE PROTEIN  SEDIMENTATION RATE  POC OCCULT BLOOD, ED  TYPE AND SCREEN  ABO/RH  PREPARE RBC (CROSSMATCH)  EKG None  Radiology DG Cervical Spine Complete  Result Date: 03/23/2019 CLINICAL DATA:  Neck pain, pain in upper back and bilateral flank pain for 10 months EXAM: CERVICAL SPINE - COMPLETE 4+ VIEW COMPARISON:  Thoracic spine evaluation of the same date. FINDINGS: Prevertebral soft tissues are normal. Spinal degenerative changes are present, worse in the lower cervical spine, greatest at C7-T1. Alignment is preserved showing mild lordotic curvature, normal appearance. Neural foraminal narrowing due to facet hypertrophy and uncovertebral degenerative changes greatest at C5-6 and C6-7 on the right, also present on the left. IMPRESSION: Spinal degenerative changes without signs of acute abnormality. Electronically Signed   By: Zetta Bills M.D.   On: 03/23/2019 11:13   DG Thoracic Spine 2 View  Result Date: 03/23/2019 CLINICAL DATA:  Back pain for 10 months. EXAM: THORACIC SPINE 2 VIEWS COMPARISON:  None. FINDINGS: There is patchy sclerosis throughout the thoracic spine with slight sclerosis of the lateral aspect of the right second rib. The findings are concerning for metastatic prostate cancer. No pathologic fracture or disc space narrowing. Aortic atherosclerosis. IMPRESSION: 1. Findings concerning for metastatic disease to the thoracic spine. The possibility of prostate cancer should be considered. 2.  Aortic Atherosclerosis (ICD10-I70.0). 3. No fractures. Electronically Signed   By: Lorriane Shire M.D.   On: 03/23/2019 11:15   DG Lumbar Spine Complete  Result Date: 03/23/2019 CLINICAL DATA:  Back pain for 10 months. EXAM:  LUMBAR SPINE - COMPLETE 4+ VIEW COMPARISON:  None. FINDINGS: There is patchy sclerosis throughout the visualized portion of the spine with patchy areas of sclerosis in the pelvic bones. There is a slight anterior wedge deformity of the superior endplate of L1 which could represent a pathologic fracture. There is disc space narrowing at L5-S1. The other disc spaces are preserved. Lateral alignment is normal. Slight degenerative facet arthritis in the lower lumbar spine. IMPRESSION: 1. Possible pathologic fracture of the superior endplate of L1. 2. Diffuse sclerotic metastatic disease in the visualized portion of the spine and pelvis. 3. Degenerative disc  disease at L5-S1. Electronically Signed   By: Lorriane Shire M.D.   On: 03/23/2019 11:17    Procedures Procedures (including critical care time)  CRITICAL CARE Performed by: Recardo Evangelist   Total critical care time: 40 minutes  Critical care time was exclusive of separately billable procedures and treating other patients.  Critical care was necessary to treat or prevent imminent or life-threatening deterioration.  Critical care was time spent personally by me on the following activities: development of treatment plan with patient and/or surrogate as well as nursing, discussions with consultants, evaluation of patient's response to treatment, examination of patient, obtaining history from patient or surrogate, ordering and performing treatments and interventions, ordering and review of laboratory studies, ordering and review of radiographic studies, pulse oximetry and re-evaluation of patient's condition.\  Medications Ordered in ED Medications  0.9 %  sodium chloride infusion (Manually program via Guardrails IV Fluids) (has no administration in time range)  HYDROcodone-acetaminophen (NORCO/VICODIN) 5-325 MG per tablet 1 tablet (1 tablet Oral Given 03/23/19 1145)  insulin aspart (novoLOG) injection 10 Units (10 Units Subcutaneous Given 03/23/19  1148)    ED Course  I have reviewed the triage vital signs and the nursing notes.  Pertinent labs & imaging results that were available during my care of the patient were reviewed by me and considered in my medical decision making (see chart for details).  74 year old male presents with worsening diffuse back pain with  associated unintentional weight loss over the past 10 months. Vitals are reassuring here. He does not have any focal areas of tenderness of this back. He is neurologically intact and ambulatory. Will obtain labs, UA, Xray of the C-spine, T and L spine.   Xray of C-spine is negative. Xray of T and L spine shows patchy sclerotic lesions throughout the T and L spine and pelvis with possible pathologic fracture of L1.   Labs show hgb of 6.1 with no recent value to compare. On chart review it looks like he is being treated for iron deficiency anemia. He denies any recent bleeding problems. Hemoccult was obtained and is negative. 1 unit of RBC ordered. CMP is remarkable for hyperglycemia (358) and significantly elevated AP (375) which is likely related to the metastatic bony lesions.   Shared visit with Dr. Regenia Skeeter. Consult placed to oncology - I spoke with Dr. Pollyann Savoy and she recommends CT chest/abd/pelvis and medicine admission. She is requesting transfer to [facility].  Discussed with Dr. Lorin Mercy with [facility] who will admit. COVID ordered.  MDM Rules/Calculators/A&P                      Lung prostate kidney Final Clinical Impression(s) / ED Diagnoses Final diagnoses:  Symptomatic anemia  Malignant neoplasm metastatic to bone (HCC)  Lesion of bone of thoracic spine  Pathological fracture of vertebra due to neoplastic disease, initial encounter    Rx / DC Orders ED Discharge Orders    None       Recardo Evangelist, PA-C 03/23/19 1700    Sherwood Gambler, MD 03/24/19 [phone]

## 2019-03-23 NOTE — ED Notes (Signed)
Spouses number [id number]

## 2019-03-23 NOTE — ED Triage Notes (Signed)
Pt reports back pain that has been ongoing for 10 months. Pt also states he has been losing weight for 10 months. Pt has decreased appetite, worsened balance over the last 10 months as well.

## 2019-03-23 NOTE — ED Notes (Signed)
[facility] called @ 1757-per Deidre Ala, RN called by Levada Dy

## 2019-03-23 NOTE — H&P (Signed)
History and Physical    Stephen Lynch WYO:[id number] DOB: 13-Dec-1945 DOA: 03/23/2019  PCP:   [facility] Consultants:  B[facility] [place] - GI Patient coming from:  Home - lives with [place]; NOK: Nelta Numbers, [phone]  Chief Complaint: back pain  HPI: Stephen Lynch is a 74 y.o. male with medical history significant of DM and HTN presenting with back pain, weight loss, decreased appetite, and poor balance x 10 months.  He has excruciating pain all along his back and the sides of his back.  It is painful with walking and has been getting worse.  Hard to get out of bed independently.  Having trouble with balance.  Has been confused intermittently, attributes this to medication.  Not eating well.  He has lost about 100 pounds over the last 10 months.  No night sweats.  Slight headaches and no visual changes.   ED Course:  Gradually worsening neck and back pain x 10 months, 100 pound weight loss.  He has been seeing his PCP, "normal" x-rays last week.  X-rays with diffuse spinal lesions and in pelvis, ?prostate.  Dr. Alvy Bimler recommends CT C/A/P.  Transfer to [facility].  Hgb is 6.  Review of Systems: As per HPI; otherwise review of systems reviewed and negative.   Ambulatory Status:  Ambulates without assistance, started using a cane last night  Past Medical History:  Diagnosis Date  . Diabetes mellitus without complication ([place])   . Hypertension     History reviewed. No pertinent surgical history.  Social History   Socioeconomic History  . Marital status: Legally Separated    Spouse name: Not on file  . Number of children: Not on file  . Years of education: Not on file  . Highest education level: Not on file  Occupational History  . Not on file  Tobacco Use  . Smoking status: Never Smoker  . Smokeless tobacco: Never Used  Substance and Sexual Activity  . Alcohol use: No  . Drug use: Not on file  . Sexual activity: Not on file  Other Topics Concern  . Not on file  Social History Narrative    . Not on file   Social Determinants of Health   Financial Resource Strain:   . Difficulty of Paying Living Expenses: Not on file  Food Insecurity:   . Worried About Charity fundraiser in the Last Year: Not on file  . Ran Out of Food in the Last Year: Not on file  Transportation Needs:   . Lack of Transportation (Medical): Not on file  . Lack of Transportation (Non-Medical): Not on file  Physical Activity:   . Days of Exercise per Week: Not on file  . Minutes of Exercise per Session: Not on file  Stress:   . Feeling of Stress : Not on file  Social Connections:   . Frequency of Communication with Friends and Family: Not on file  . Frequency of Social Gatherings with Friends and Family: Not on file  . Attends Religious Services: Not on file  . Active Member of Clubs or Organizations: Not on file  . Attends Archivist Meetings: Not on file  . Marital Status: Not on file  Intimate Partner Violence:   . Fear of Current or Ex-Partner: Not on file  . Emotionally Abused: Not on file  . Physically Abused: Not on file  . Sexually Abused: Not on file    No Known Allergies  No family history on file.  Prior to Admission medications  Medication Sig Start Date End Date Taking? Authorizing Provider  acetaminophen (TYLENOL) 500 MG tablet Take 500 mg by mouth every 6 (six) hours as needed for mild pain.   Yes [provider]  ASPIRIN 81 PO Take 81 mg by mouth daily.    Yes [provider]  Calcium-Magnesium-Zinc [phone] MG TABS Take 1 tablet by mouth daily.   Yes [provider]  ibuprofen (ADVIL) 200 MG tablet Take 200 mg by mouth every 6 (six) hours as needed for moderate pain.   Yes [provider]  insulin lispro (HUMALOG) 100 UNIT/ML KwikPen Junior Inject 0.2 mLs (20 Units total) into the skin 3 (three) times daily after meals. Patient taking differently: Inject 7-25 Units into the skin 3 (three) times daily after meals. Per sliding  scale 09/25/16  Yes [name], MD  Menthol-Camphor (TIGER BALM ARTHRITIS RUB EX) Apply 1 application topically 2 (two) times daily as needed (back pain).   Yes [provider]  rosuvastatin (CRESTOR) 10 MG tablet Take 1 tablet (10 mg total) by mouth daily. 09/25/16  Yes [name], MD  TRESIBA FLEXTOUCH 100 UNIT/ML SOPN FlexTouch Pen Inject 10 Units into the skin at bedtime.  02/18/19  Yes [provider]  ULTRAM 50 MG tablet Take 50 mg by mouth every 6 (six) hours as needed for pain. 02/26/19  Yes [provider]  enalapril (VASOTEC) 10 MG tablet Take 1 tablet (10 mg total) by mouth daily. Patient not taking: Reported on 03/23/2019 09/25/16   [name], MD  Insulin Detemir (LEVEMIR) 100 UNIT/ML Pen Inject 25 Units into the skin daily at 10 pm. Patient not taking: Reported on 03/23/2019 09/25/16   [name], MD  insulin lispro (HUMALOG) 100 UNIT/ML KiwkPen Inject into the skin.    [provider]    Physical Exam: Vitals:   03/23/19 0947 03/23/19 1139 03/23/19 1207  BP: (!) 124/58  (!) 130/59  Pulse: 90    Resp: 17  20  Temp: 99 F (37.2 C)    TempSrc: Oral    SpO2: 97%    Weight:  81.5 kg      . General:  Appears frail, cachectic, chronically ill . Eyes:  PERRL, EOMI, normal lids, iris . ENT:  grossly normal hearing, lips & tongue, mildly dry mm . Neck:  no LAD, masses or thyromegaly . Cardiovascular:  RRR, no m/r/g. No LE edema.  Marland Kitchen Respiratory:   CTA bilaterally with no wheezes/rales/rhonchi.  Normal respiratory effort. . Abdomen:  soft, NT, ND, NABS . Back:   normal alignment, diffuse spinal TTP with slight bulge along lower L-spine and paraspinous muscle TTP . Skin:  no rash or induration seen on limited exam . Musculoskeletal:  grossly normal tone BUE/BLE, good ROM, no bony abnormality . Psychiatric:  grossly normal mood and affect, speech fluent and appropriate, AOx3 . Neurologic:  CN 2-12 grossly intact, moves all  extremities in coordinated fashion    Radiological Exams on Admission: DG Cervical Spine Complete  Result Date: 03/23/2019 CLINICAL DATA:  Neck pain, pain in upper back and bilateral flank pain for 10 months EXAM: CERVICAL SPINE - COMPLETE 4+ VIEW COMPARISON:  Thoracic spine evaluation of the same date. FINDINGS: Prevertebral soft tissues are normal. Spinal degenerative changes are present, worse in the lower cervical spine, greatest at C7-T1. Alignment is preserved showing mild lordotic curvature, normal appearance. Neural foraminal narrowing due to facet hypertrophy and uncovertebral degenerative changes greatest at C5-6 and C6-7 on the right, also present  on the left. IMPRESSION: Spinal degenerative changes without signs of acute abnormality. Electronically Signed   By: Zetta Bills M.D.   On: 03/23/2019 11:13   DG Thoracic Spine 2 View  Result Date: 03/23/2019 CLINICAL DATA:  Back pain for 10 months. EXAM: THORACIC SPINE 2 VIEWS COMPARISON:  None. FINDINGS: There is patchy sclerosis throughout the thoracic spine with slight sclerosis of the lateral aspect of the right second rib. The findings are concerning for metastatic prostate cancer. No pathologic fracture or disc space narrowing. Aortic atherosclerosis. IMPRESSION: 1. Findings concerning for metastatic disease to the thoracic spine. The possibility of prostate cancer should be considered. 2.  Aortic Atherosclerosis (ICD10-I70.0). 3. No fractures. Electronically Signed   By: Lorriane Shire M.D.   On: 03/23/2019 11:15   DG Lumbar Spine Complete  Result Date: 03/23/2019 CLINICAL DATA:  Back pain for 10 months. EXAM: LUMBAR SPINE - COMPLETE 4+ VIEW COMPARISON:  None. FINDINGS: There is patchy sclerosis throughout the visualized portion of the spine with patchy areas of sclerosis in the pelvic bones. There is a slight anterior wedge deformity of the superior endplate of L1 which could represent a pathologic fracture. There is disc space narrowing  at L5-S1. The other disc spaces are preserved. Lateral alignment is normal. Slight degenerative facet arthritis in the lower lumbar spine. IMPRESSION: 1. Possible pathologic fracture of the superior endplate of L1. 2. Diffuse sclerotic metastatic disease in the visualized portion of the spine and pelvis. 3. Degenerative disc  disease at L5-S1. Electronically Signed   By: Lorriane Shire M.D.   On: 03/23/2019 11:17    EKG: Independently reviewed.  NSR with rate 86; LVH with no evidence of acute ischemia   Labs on Admission: I have personally reviewed the available labs and imaging studies at the time of the admission.  Pertinent labs:   Na++ 132 Glucose 358 AP 375 Albumin 2.2 WBC 8.5 Hgb 6.1 ESR >140   Assessment/Plan Active Problems:   * No active hospital problems. *    Back pain from spinal metastatic disease -Patient presenting with intractable back pain, progressive over the last 10 months with weight loss (100 pounds!), balance issues, fatigue, immobility -Xrays were concerning for spinal metastatic disease -CT C/A/P was performed and showed diffuse mixed lytic and sclerotic metastatic bone disease suspicious for metastatic prostate cancer; possible also with left adrenal met -Oncology has been consulted and will see the patient at [facility]; Dr. Alvy Bimler has ordered additional labs for evaluation  Symptomatic anemia -Patient with fatigue, likely related to cancer -Hgb 6.1, normocytic -Heme testing was negative in the ER -Will admit [facility] -Transfuse 1 unit PRBC to start and recheck Hgb afterwards.   -Patient counseled about short- and long-term risks associated with transfusion and consents to receive blood products.  COVID-19 infection -Incidental finding -No apparent hypoxia -His primary issue appears to be associated with metastatic cancer - although COVID is sometimes a mimic for other issues -Will order COVID labs for tomorrow AM - but his inflammatory  markers will be elevated due to his metastatic disease so this may not be particularly helpful  Biliary dilatation -Seen on CT -Consider ERCP/MRCP -Also with possible pancreatic duct stricture -Would defer this evaluation pending primary metastatic evaluation unless this issue becomes more pressing sooner  COPD -Appreciated on CT -No apparent metastatic disease -Needs repeat CT in 3 months  DM -Continue Tresiba -Will cover with sensitive-scale SSI  HTN -He does not appear to be taking medication  for this issue at this time  HLD -Continue Crestor for now    DVT prophylaxis:  Lovenox  Code Status:  Full - confirmed with patient/family Family Communication: None present; I spoke with the patient's fiancee by telephone at the time of admission. Disposition Plan: To be determined Consults called: Oncology Admission status: Admit - It is my clinical opinion that admission to INPATIENT is reasonable and necessary because of the expectation that this patient will require hospital care that crosses at least 2 midnights to treat this condition based on the medical complexity of the problems presented.  Given the aforementioned information, the predictability of an adverse outcome is felt to be significant.    Karmen Bongo MD [facility]   How to contact the [facility] Attending or Consulting provider [place] or covering provider during after hours [place], for this patient?  1. Check the care team in [facility] and look for a) attending/consulting [facility] provider listed and b) the [facility] team listed 2. Log into [email or web address] and use [place]'s universal password to access. If you do not have the password, please contact the hospital operator. 3. Locate the [facility] provider you are looking for under [facility] and page to a number that you can be directly reached. 4. If you still have difficulty reaching the provider, please page the [facility] (Director on Call) for the Hospitalists listed on  amion for assistance.   03/23/2019, 12:23 PM

## 2019-03-24 DIAGNOSIS — J449 Chronic obstructive pulmonary disease, unspecified: Secondary | ICD-10-CM

## 2019-03-24 LAB — PSA: Prostatic Specific Antigen: 193.02 ng/mL — ABNORMAL HIGH (ref 0.00–4.00)

## 2019-03-24 LAB — CBC
HCT: 24.4 % — ABNORMAL LOW (ref 39.0–52.0)
Hemoglobin: 7.1 g/dL — ABNORMAL LOW (ref 13.0–17.0)
MCH: 26.1 pg (ref 26.0–34.0)
MCHC: 29.1 g/dL — ABNORMAL LOW (ref 30.0–36.0)
MCV: 89.7 fL (ref 80.0–100.0)
Platelets: 335 10*3/uL (ref 150–400)
RBC: 2.72 MIL/uL — ABNORMAL LOW (ref 4.22–5.81)
RDW: 20.7 % — ABNORMAL HIGH (ref 11.5–15.5)
WBC: 6.1 10*3/uL (ref 4.0–10.5)
nRBC: 1 % — ABNORMAL HIGH (ref 0.0–0.2)

## 2019-03-24 LAB — TYPE AND SCREEN
ABO/RH(D): AB POS
Antibody Screen: NEGATIVE
Unit division: 0

## 2019-03-24 LAB — BPAM RBC
Blood Product Expiration Date: 202103112359
ISSUE DATE / TIME: 202102091426
Unit Type and Rh: 8400

## 2019-03-24 LAB — GLUCOSE, CAPILLARY
Glucose-Capillary: 283 mg/dL — ABNORMAL HIGH (ref 70–99)
Glucose-Capillary: 210 mg/dL — ABNORMAL HIGH (ref 70–99)
Glucose-Capillary: 280 mg/dL — ABNORMAL HIGH (ref 70–99)

## 2019-03-24 LAB — ABO/RH: ABO/RH(D): AB POS

## 2019-03-24 LAB — PREPARE RBC (CROSSMATCH)

## 2019-03-24 MED ORDER — OXYCODONE-ACETAMINOPHEN 5-325 MG PO TABS
1.0000 | ORAL_TABLET | Freq: Four times a day (QID) | ORAL | Status: DC | PRN
  Administered 2019-03-24 – 2019-03-25 (×3): 2 via ORAL
  Filled 2019-03-24 (×3): qty 2

## 2019-03-24 MED ORDER — ENSURE ENLIVE PO LIQD
237.0000 mL | Freq: Two times a day (BID) | ORAL | Status: DC
  Administered 2019-03-24: 237 mL via ORAL

## 2019-03-24 MED ORDER — SODIUM CHLORIDE 0.9% IV SOLUTION: Freq: Once | INTRAVENOUS | Status: AC

## 2019-03-24 MED ORDER — PRO-STAT SUGAR FREE PO LIQD
30.0000 mL | Freq: Three times a day (TID) | ORAL | Status: DC
  Administered 2019-03-24 – 2019-03-25 (×4): 30 mL via ORAL
  Filled 2019-03-24 (×4): qty 30

## 2019-03-24 MED ORDER — ADULT MULTIVITAMIN W/MINERALS CH
1.0000 | ORAL_TABLET | Freq: Every day | ORAL | Status: DC
  Administered 2019-03-24 – 2019-03-25 (×2): 1 via ORAL
  Filled 2019-03-24 (×2): qty 1

## 2019-03-24 NOTE — Progress Notes (Addendum)
PROGRESS NOTE  Stephen Lynch BOF:[id number] DOB: 1945-10-06 DOA: 03/23/2019 PCP: Patient, No Pcp Per   LOS: 1 day   Brief narrative: As per HPI,  Stephen Lynch is a 74 y.o. male with medical history significant of DM and HTN presented to the hospital with back pain, weight loss, decreased appetite, and poor balance x 10 months.  He complained of excruciating pain all along his back and the sides of his back.  It was painful with walking and has been getting worse.  Hard to get out of bed independently.  Having trouble with balance.  Has been confused intermittently, attributes this to medication.  Not eating well.  He has lost about 100 pounds over the last 10 months.  ED Course:  Gradually worsening neck and back pain x 10 months, 100 pound weight loss.  He has been seeing his PCP, "normal" x-rays last week.  X-rays with diffuse spinal lesions and in pelvis.  Dr. Alvy Bimler recommended admission with CT C/A/P.  Hemoglobin was 6.0 on presentation.  Assessment/Plan:  Principal Problem:   Back pain Active Problems:   Hypertension   Dyslipidemia   Diabetes mellitus without complication (HCC)   Metastasis to spinal column (HCC)   Symptomatic anemia   COVID-19 virus infection   Dilation of biliary tract   COPD (chronic obstructive pulmonary disease) (HCC)  Back pain likely from spinal metastatic disease Patient with intractable back pain and significant weight loss, fatigue and difficulty ambulation.  CT showed  diffuse mixed lytic and sclerotic metastatic bone disease suspicious for metastatic prostate cancer; possible also with left adrenal met.  Oncology has been consulted and communicated with Dr. Alvy Bimler.  Pending SPEP, CA 19-9.  Pending kappa/lambda light chains.  Will aim for hemoglobin of 8 and better pain control.  PSA elevated at 193.  Will need to follow-up with oncology as outpatient on discharge.  We will add percocet today with tramadol.  Will discontinue morphine.  Will ambulate the  patient.  Get PT evaluation.  Symptomatic anemia Likely secondary to metastatic cancer.  Hemoglobin of 6.1 on presentation which was normocytic. Stool Hemoccult was negative.  Received packed RBC.  CBC pending today.  Check CBC in a.m.  COVID-19 infection Incidental finding.  No hypoxia but a CT scan showed patchy ill-defined nodular densities in both lungs.  Awaiting for inflammatory markers.  COVID-19 Labs  No results for input(s): DDIMER, FERRITIN, LDH, CRP in the last 72 hours.  Lab Results  Component Value Date   SARSCOV2NAA POSITIVE (A) 03/23/2019    Biliary dilatation Likely secondary to  metastatic disease seen on the CT scan.   COPD.  On Respiratory imaging.  No acute issues at this time  DM Type II On Tresiba at home.  Continue with sliding scale insulin and long-acting insulin.  Accu-Cheks diabetic diet  Essential HTN Not on any medications.  Hyperlipidemia Continue statins.  VTE Prophylaxis: Lovenox subq  Code Status: Full code  Family Communication: I spoke with the patient's finance and updated her about the clinical condition of the patient.   Disposition Plan:  . Patient is from home.  Lives with his fiance. . Likely disposition to home with home health in 1 to 2 days . Barriers to discharge: Blood transfusion to keep hemoglobin more than 8., adequate pain control, check CBC today.  Consultants:  Oncology  Procedures:  PRBC transfusion  Antibiotics: . None  Anti-infectives (From admission, onward)   None     Subjective: Today, patient was seen and  examined at bedside.  Today, denies shortness of breath, cough, nausea, vomiting.  Complains of abdominal pain and backpain.  Had bowel movement.  Objective: Vitals:   03/24/19 0300 03/24/19 0602  BP: 135/62 126/65  Pulse: 85 84  Resp: 18   Temp: 98.4 F (36.9 C) 98.5 F (36.9 C)  SpO2: 100% 99%    Intake/Output Summary (Last 24 hours) at 03/24/2019 0752 Last data filed at  03/23/2019 1725 Gross per 24 hour  Intake 945 ml  Output --  Net 945 ml   Filed Weights   03/23/19 1139 03/24/19 0300  Weight: 81.5 kg 81.5 kg   Body mass index is 23.71 kg/m.   Physical Exam:  GENERAL: Patient is alert awake and oriented. Not in obvious distress.  Thinly built, appears weak HENT: No scleral pallor or icterus. Pupils equally reactive to light. Oral mucosa is mildly dry NECK: is supple, no gross swelling noted. CHEST: Clear to auscultation. No crackles or wheezes.  Diminished breath sounds bilaterally.  Diffuse tenderness over the spine on palpation. CVS: S1 and S2 heard, no murmur. Regular rate and rhythm.  ABDOMEN: Soft, mild right upper quadrant tenderness on palpation., bowel sounds are present. EXTREMITIES: No edema. CNS: Cranial nerves are intact. No focal motor deficits. SKIN: warm and dry without rashes.  Data Review: I have personally reviewed the following laboratory data and studies,  CBC: Recent Labs  Lab 03/23/19 1021  WBC 8.5  NEUTROABS 6.3  HGB 6.1*  HCT 20.8*  MCV 87.8  PLT 161   Basic Metabolic Panel: Recent Labs  Lab 03/23/19 1021  NA 132*  K 4.1  CL 93*  CO2 25  GLUCOSE 358*  BUN 16  CREATININE 0.88  CALCIUM 8.5*   Liver Function Tests: Recent Labs  Lab 03/23/19 1021  AST 13*  ALT 22  ALKPHOS 375*  BILITOT 0.7  PROT 6.8  ALBUMIN 2.2*   No results for input(s): LIPASE, AMYLASE in the last 168 hours. No results for input(s): AMMONIA in the last 168 hours. Cardiac Enzymes: No results for input(s): CKTOTAL, CKMB, CKMBINDEX, TROPONINI in the last 168 hours. BNP (last 3 results) No results for input(s): BNP in the last 8760 hours.  ProBNP (last 3 results) No results for input(s): PROBNP in the last 8760 hours.  CBG: Recent Labs  Lab 03/23/19 1727  GLUCAP 219*   Recent Results (from the past 240 hour(s))  SARS CORONAVIRUS 2 (TAT 6-24 HRS) Nasopharyngeal Nasopharyngeal Swab     Status: Abnormal   Collection Time:  03/23/19 12:24 PM   Specimen: Nasopharyngeal Swab  Result Value Ref Range Status   SARS Coronavirus 2 POSITIVE (A) NEGATIVE Final    Comment: RESULT CALLED TO, READ BACK BY AND VERIFIED WITH: E ADKINS,RN 1710 03/23/2019 D BRADLEY (NOTE) SARS-CoV-2 target nucleic acids are DETECTED. The SARS-CoV-2 RNA is generally detectable in upper and lower respiratory specimens during the acute phase of infection. Positive results are indicative of the presence of SARS-CoV-2 RNA. Clinical correlation with patient history and other diagnostic information is  necessary to determine patient infection status. Positive results do not rule out bacterial infection or co-infection with other viruses.  The expected result is Negative. Fact Sheet for Patients: SugarRoll.be Fact Sheet for Healthcare Providers: https://www.woods-mathews.com/ This test is not yet approved or cleared by the [place] FDA and  has been authorized for detection and/or diagnosis of SARS-CoV-2 by FDA under an Emergency Use Authorization (EUA). This EUA will remain  in effect (meaning this test  can be used) for the  duration of the COVID-19 declaration under Section 564(b)(1) of the Act, 21 U.S.C. section 360bbb-3(b)(1), unless the authorization is terminated or revoked sooner. Performed at [facility], [place] [address], [place], [place] [address]      Studies: DG Cervical Spine Complete  Result Date: 03/23/2019 CLINICAL DATA:  Neck pain, pain in upper back and bilateral flank pain for 10 months EXAM: CERVICAL SPINE - COMPLETE 4+ VIEW COMPARISON:  Thoracic spine evaluation of the same date. FINDINGS: Prevertebral soft tissues are normal. Spinal degenerative changes are present, worse in the lower cervical spine, greatest at C7-T1. Alignment is preserved showing mild lordotic curvature, normal appearance. Neural foraminal narrowing due to facet hypertrophy and uncovertebral degenerative  changes greatest at C5-6 and C6-7 on the right, also present on the left. IMPRESSION: Spinal degenerative changes without signs of acute abnormality. Electronically Signed   By: Zetta Bills M.D.   On: 03/23/2019 11:13   DG Thoracic Spine 2 View  Result Date: 03/23/2019 CLINICAL DATA:  Back pain for 10 months. EXAM: THORACIC SPINE 2 VIEWS COMPARISON:  None. FINDINGS: There is patchy sclerosis throughout the thoracic spine with slight sclerosis of the lateral aspect of the right second rib. The findings are concerning for metastatic prostate cancer. No pathologic fracture or disc space narrowing. Aortic atherosclerosis. IMPRESSION: 1. Findings concerning for metastatic disease to the thoracic spine. The possibility of prostate cancer should be considered. 2.  Aortic Atherosclerosis (ICD10-I70.0). 3. No fractures. Electronically Signed   By: Lorriane Shire M.D.   On: 03/23/2019 11:15   DG Lumbar Spine Complete  Result Date: 03/23/2019 CLINICAL DATA:  Back pain for 10 months. EXAM: LUMBAR SPINE - COMPLETE 4+ VIEW COMPARISON:  None. FINDINGS: There is patchy sclerosis throughout the visualized portion of the spine with patchy areas of sclerosis in the pelvic bones. There is a slight anterior wedge deformity of the superior endplate of L1 which could represent a pathologic fracture. There is disc space narrowing at L5-S1. The other disc spaces are preserved. Lateral alignment is normal. Slight degenerative facet arthritis in the lower lumbar spine. IMPRESSION: 1. Possible pathologic fracture of the superior endplate of L1. 2. Diffuse sclerotic metastatic disease in the visualized portion of the spine and pelvis. 3. Degenerative disc  disease at L5-S1. Electronically Signed   By: Lorriane Shire M.D.   On: 03/23/2019 11:17   CT Chest W Contrast  Result Date: 03/23/2019 CLINICAL DATA:  Sclerotic bone disease and pathologic fractures seen on radiographs. EXAM: CT CHEST, ABDOMEN, AND PELVIS WITH CONTRAST TECHNIQUE:  Multidetector CT imaging of the chest, abdomen and pelvis was performed following the standard protocol during bolus administration of intravenous contrast. CONTRAST:  167m OMNIPAQUE IOHEXOL 300 MG/ML  SOLN COMPARISON:  Radiographs, same date. FINDINGS: CT CHEST FINDINGS Cardiovascular: The heart is normal in size. No pericardial effusion. The aorta is normal in caliber. No dissection. The branch vessels are patent. Scattered atherosclerotic calcifications. Scattered coronary artery calcifications. Mediastinum/Nodes: No mediastinal or hilar mass or adenopathy. The esophagus is grossly normal. The thyroid gland is enlarged and there are multiple thyroid nodules. The largest nodule measures 13.5 mm. Substernal thyroid tissue noted on the right side adjacent to the esophagus. Lungs/Pleura: Emphysematous changes are noted. Patchy ill-defined nodular densities and both lungs possibly areas of inflammation or infection. Metastatic disease seems unlikely. Short-term follow-up chest CT in 3 months is suggested. No pleural effusion or pleural nodules. Musculoskeletal: Musculoskeletal diffuse mixed lytic and sclerotic metastatic bone  disease involving all of the visualized osseous structures. I do not see any obvious pathologic fractures or spinal canal compromise. CT ABDOMEN PELVIS FINDINGS Hepatobiliary: No focal hepatic lesions are identified. There is mild intra hepatic biliary dilatation and moderate extrahepatic biliary dilatation. The common bile duct in the porta hepatis measures up to 18 mm and also measures a maximum of 12.5 mm in the head of the pancreas. It does appear to taper normally to the ampulla. No obvious obstructing common bile duct stone. The gallbladder is unremarkable. Pancreas: The pancreatic head appears normal. Normal caliber main pancreatic duct in the pancreatic head. However, the pancreatic body and tail are significantly atrophied and there is fairly marked distention of the main pancreatic  duct. Do not see an obvious obstructing mass at the head body junction region. Moderate-sized duodenal diverticulum noted near the pancreatic head. Spleen: Normal size. No focal lesions. Adrenals/Urinary Tract: A left adrenal gland lesion measures 2.5 x 1.5 cm and could be a metastatic focus. No worrisome renal lesions or hydronephrosis. The bladder appears normal. Stomach/Bowel: The stomach, duodenum, small bowel and colon are grossly normal. No acute inflammatory changes, mass lesions or obstructive findings. The terminal ileum and appendix are normal. Vascular/Lymphatic: The aorta and branch vessels are patent. The major venous structures are patent. Small scattered paraduodenal lymph nodes measuring a maximum of 9.5 mm. Small scattered left-sided retroperitoneal lymph nodes. The largest measures 10 mm on image 70/2. No pelvic adenopathy. Small scattered pelvic lymph nodes are noted. No inguinal adenopathy. Reproductive: The prostate gland and seminal vesicles are grossly normal. Other: No pelvic mass or adenopathy. No free pelvic fluid collections. No inguinal mass or adenopathy. No abdominal wall hernia or subcutaneous lesions. Musculoskeletal: Extensive mixed lytic and sclerotic metastatic bone disease without evidence of pathologic fracture. IMPRESSION: 1. Diffuse mixed lytic and sclerotic metastatic bone disease without evidence of pathologic fracture or spinal canal compromise. Findings suspicious for metastatic prostate cancer. Recommend correlation with PSA level. 2. 2.5 x 1.5 cm left adrenal gland lesion suspicious for metastatic disease. 3. Intra and extrahepatic biliary dilatation without obvious cause. Recommend correlation with liver function studies. ERCP or MRCP may be helpful for further evaluation. 4. Atrophy of the pancreatic body and tail and distended main pancreatic duct. No obvious mass at the head neck junction region. Possible stricture. MRI may be helpful for further evaluation. 5.  Emphysematous changes and patchy ill-defined nodular densities in both lungs, unlikely metastatic disease. Recommend short-term follow-up noncontrast chest CT in 3 months. Aortic Atherosclerosis (ICD10-I70.0) and Emphysema (ICD10-J43.9). Electronically Signed   By: Marijo Sanes M.D.   On: 03/23/2019 14:43   CT ABDOMEN PELVIS W CONTRAST  Result Date: 03/23/2019 CLINICAL DATA:  Sclerotic bone disease and pathologic fractures seen on radiographs. EXAM: CT CHEST, ABDOMEN, AND PELVIS WITH CONTRAST TECHNIQUE: Multidetector CT imaging of the chest, abdomen and pelvis was performed following the standard protocol during bolus administration of intravenous contrast. CONTRAST:  154m OMNIPAQUE IOHEXOL 300 MG/ML  SOLN COMPARISON:  Radiographs, same date. FINDINGS: CT CHEST FINDINGS Cardiovascular: The heart is normal in size. No pericardial effusion. The aorta is normal in caliber. No dissection. The branch vessels are patent. Scattered atherosclerotic calcifications. Scattered coronary artery calcifications. Mediastinum/Nodes: No mediastinal or hilar mass or adenopathy. The esophagus is grossly normal. The thyroid gland is enlarged and there are multiple thyroid nodules. The largest nodule measures 13.5 mm. Substernal thyroid tissue noted on the right side adjacent to the esophagus. Lungs/Pleura: Emphysematous changes are noted. Patchy ill-defined nodular  densities and both lungs possibly areas of inflammation or infection. Metastatic disease seems unlikely. Short-term follow-up chest CT in 3 months is suggested. No pleural effusion or pleural nodules. Musculoskeletal: Musculoskeletal diffuse mixed lytic and sclerotic metastatic bone disease involving all of the visualized osseous structures. I do not see any obvious pathologic fractures or spinal canal compromise. CT ABDOMEN PELVIS FINDINGS Hepatobiliary: No focal hepatic lesions are identified. There is mild intra hepatic biliary dilatation and moderate extrahepatic  biliary dilatation. The common bile duct in the porta hepatis measures up to 18 mm and also measures a maximum of 12.5 mm in the head of the pancreas. It does appear to taper normally to the ampulla. No obvious obstructing common bile duct stone. The gallbladder is unremarkable. Pancreas: The pancreatic head appears normal. Normal caliber main pancreatic duct in the pancreatic head. However, the pancreatic body and tail are significantly atrophied and there is fairly marked distention of the main pancreatic duct. Do not see an obvious obstructing mass at the head body junction region. Moderate-sized duodenal diverticulum noted near the pancreatic head. Spleen: Normal size. No focal lesions. Adrenals/Urinary Tract: A left adrenal gland lesion measures 2.5 x 1.5 cm and could be a metastatic focus. No worrisome renal lesions or hydronephrosis. The bladder appears normal. Stomach/Bowel: The stomach, duodenum, small bowel and colon are grossly normal. No acute inflammatory changes, mass lesions or obstructive findings. The terminal ileum and appendix are normal. Vascular/Lymphatic: The aorta and branch vessels are patent. The major venous structures are patent. Small scattered paraduodenal lymph nodes measuring a maximum of 9.5 mm. Small scattered left-sided retroperitoneal lymph nodes. The largest measures 10 mm on image 70/2. No pelvic adenopathy. Small scattered pelvic lymph nodes are noted. No inguinal adenopathy. Reproductive: The prostate gland and seminal vesicles are grossly normal. Other: No pelvic mass or adenopathy. No free pelvic fluid collections. No inguinal mass or adenopathy. No abdominal wall hernia or subcutaneous lesions. Musculoskeletal: Extensive mixed lytic and sclerotic metastatic bone disease without evidence of pathologic fracture. IMPRESSION: 1. Diffuse mixed lytic and sclerotic metastatic bone disease without evidence of pathologic fracture or spinal canal compromise. Findings suspicious for  metastatic prostate cancer. Recommend correlation with PSA level. 2. 2.5 x 1.5 cm left adrenal gland lesion suspicious for metastatic disease. 3. Intra and extrahepatic biliary dilatation without obvious cause. Recommend correlation with liver function studies. ERCP or MRCP may be helpful for further evaluation. 4. Atrophy of the pancreatic body and tail and distended main pancreatic duct. No obvious mass at the head neck junction region. Possible stricture. MRI may be helpful for further evaluation. 5. Emphysematous changes and patchy ill-defined nodular densities in both lungs, unlikely metastatic disease. Recommend short-term follow-up noncontrast chest CT in 3 months. Aortic Atherosclerosis (ICD10-I70.0) and Emphysema (ICD10-J43.9). Electronically Signed   By: Marijo Sanes M.D.   On: 03/23/2019 14:43    Flora Lipps, MD  [facility] 03/24/2019

## 2019-03-24 NOTE — TOC Initial Note (Signed)
Transition of Care [facility]) - Initial/Assessment Note    Patient Details  Name: Stephen Lynch MRN: [id number] Date of Birth: [birth year]  [facility]) CM/SW Contact:    Purcell Mouton, RN Phone Number: 03/24/2019, 11:42 AM  Clinical Narrative:                 Pt is confused at present time home with wife. COVID positive on 03/23/2019.  Expected Discharge Plan: [place] Barriers to Discharge: No Barriers Identified   Patient Goals and CMS Choice Patient states their goals for this hospitalization and ongoing recovery are:: Pt is confused      Expected Discharge Plan and Services Expected Discharge Plan: [place]   Discharge Planning Services: CM Consult   Living arrangements for the past 2 months: Single Family Home                                      Prior Living Arrangements/Services Living arrangements for the past 2 months: Single Family Home Lives with:: Spouse Patient language and need for interpreter reviewed:: Yes Do you feel safe going back to the place where you live?: Yes               Activities of Daily Living Home Assistive Devices/Equipment: None ADL Screening (condition at time of admission) Patient's cognitive ability adequate to safely complete daily activities?: Yes Is the patient deaf or have difficulty hearing?: No Does the patient have difficulty seeing, even when wearing glasses/contacts?: No Does the patient have difficulty concentrating, remembering, or making decisions?: No Patient able to express need for assistance with ADLs?: Yes Does the patient have difficulty dressing or bathing?: No Independently performs ADLs?: Yes (appropriate for developmental age) Does the patient have difficulty walking or climbing stairs?: Yes Weakness of Legs: Both Weakness of Arms/Hands: Both  Permission Sought/Granted Permission sought to share information with : Case Manager                 Emotional Assessment Appearance:: Appears stated age     Orientation: : Oriented to Self      Admission diagnosis:  Back pain [M54.9] Malignant neoplasm metastatic to bone ([place]) [C79.51] Symptomatic anemia [D64.9] Pathological fracture of vertebra due to neoplastic disease, initial encounter [M84.58XA] Lesion of bone of thoracic spine [M89.9] Patient Active Problem List   Diagnosis Date Noted  . Back pain 03/23/2019  . Metastasis to spinal column ([place]) 03/23/2019  . Symptomatic anemia 03/23/2019  . COVID-19 virus infection 03/23/2019  . Dilation of biliary tract 03/23/2019  . COPD (chronic obstructive pulmonary disease) ([place]) 03/23/2019  . Hypertension   . Dyslipidemia   . Diabetes mellitus without complication [facility])    PCP:  Patient, No Pcp Per Pharmacy:   [id number] - [place], [place] [address] [place] [address] Phone: [phone] Fax: [phone]     Social Determinants of Health (SDOH) Interventions    Readmission Risk Interventions No flowsheet data found.

## 2019-03-24 NOTE — Progress Notes (Signed)
Initial Nutrition Assessment  RD working remotely.   DOCUMENTATION CODES:   Not applicable  INTERVENTION:  - continue Ensure Enlive BID, each supplement provides 350 kcal and 20 grams of protein. - will order 30 mL Prostat TID, each supplement provides 100 kcal and 15 grams of protein. - will order daily multivitamin with minerals.    NUTRITION DIAGNOSIS:   Increased nutrient needs related to catabolic illness, acute ZOXWRUE(AVWUJ-81 infection) as evidenced by estimated needs.  GOAL:   Patient will meet greater than or equal to 90% of their needs  MONITOR:   PO intake, Supplement acceptance, Labs, Weight trends  REASON FOR ASSESSMENT:   Malnutrition Screening Tool, Consult Assessment of nutrition requirement/status  ASSESSMENT:   74 y.o. male with medical history significant of DM and HTN. He presented to the ED due to back pain, weight loss, decreased appetite, and poor balance x10 months. He reported severe pain along both flanks and his back; walking is painful. He has difficulty standing independently. He reported 100 lb weight loss over the past 10 months.  No intakes documented since admission. Patient had reported 100 lb weight loss in the past 10 months. Per chart review, weight today is 180 lb and PTA the most recently documented weight was on 08/14/16 when he weighed 229 lb. This indicates 49 lb weight loss (21% body weight); not significant for time frame.  Per notes: - back pain 2/2 spinal metastatic disease - Oncology consulted and now following--concern for malabsorption, concern for multiple myeloma vs prostate cancer; earliest able to be seen at the [facility]: 2/22 - symptomatic anemia - COVID+ - biliary dilatation--possible ERCP/MRCP; possible pancreatic duct stricture   Labs reviewed; CBG: 283 mg/dl, Na: 132 mmol/l, Cl: 93 mmol/l, Ca: 8.5 mg/dl, Alk Phos elevated. Medications reviewed; 100 mg colace BID, sliding scale novolog, 10 units lantus/day.     NUTRITION - FOCUSED PHYSICAL EXAM:  unable to complete for COVID+ patient.   Diet Order:   Diet Order            Diet Carb Modified Fluid consistency: Thin; Room service appropriate? Yes  Diet effective now              EDUCATION NEEDS:   Not appropriate for education at this time  Skin:  Skin Assessment: Reviewed RN Assessment  Last BM:  PTA/unknown  Height:   Ht Readings from Last 1 Encounters:  03/24/19 6' 0.99" (1.854 m)    Weight:   Wt Readings from Last 1 Encounters:  03/24/19 81.5 kg    Ideal Body Weight:  83.6 kg  BMI:  Body mass index is 23.71 kg/m.  Estimated Nutritional Needs:   Kcal:  2400-2600 kcal  Protein:  120-135 grams  Fluid:  >/= 2.5 L/day     Jarome Matin, MS, RD, LDN, CNSC Inpatient Clinical Dietitian RD pager # available in [facility]  After hours/weekend pager # available in [facility]

## 2019-03-24 NOTE — Progress Notes (Signed)
I have reviewed the case briefly with the hospitalist, the patient is not seen His imaging studies are highly suggestive of metastatic cancer in the bones, primary unknown I recommend myeloma panel along with PSA to determine whether this is multiple myeloma versus prostate cancer The abnormal pancreatic appearance on CT imaging is consistent with atrophy which is not uncommon amongst patient with diabetes although I wonder if his weight loss could be contributed by possibility of malabsorption I recommend transfusion support to keep hemoglobin greater than 8 and pain control If we can achieve these goals within the next 24 hours, he can be discharged with outpatient follow-up The patient tested Covid 19+ although CT imaging did not show significant lung infiltrates Based on the latest guidelines, if the patient does not need treatment for COVID-19 or oxygen therapy, he can be seen 10 days away from the date he tested positive, meaning, he can be seen the earliest at the cancer center on February 22 I will watch out for test results to determine outpatient follow-up and whether further testing needs to be done

## 2019-03-25 LAB — COMPREHENSIVE METABOLIC PANEL
ALT: 25 U/L (ref 0–44)
AST: 25 U/L (ref 15–41)
Albumin: 2.3 g/dL — ABNORMAL LOW (ref 3.5–5.0)
Alkaline Phosphatase: 273 U/L — ABNORMAL HIGH (ref 38–126)
Anion gap: 9 (ref 5–15)
BUN: 24 mg/dL — ABNORMAL HIGH (ref 8–23)
CO2: 25 mmol/L (ref 22–32)
Calcium: 8.2 mg/dL — ABNORMAL LOW (ref 8.9–10.3)
Chloride: 100 mmol/L (ref 98–111)
Creatinine, Ser: 0.73 mg/dL (ref 0.61–1.24)
GFR calc Af Amer: >60 mL/min (ref >60)
GFR calc non Af Amer: >60 mL/min (ref >60)
Glucose, Bld: 256 mg/dL — ABNORMAL HIGH (ref 70–99)
Potassium: 4.2 mmol/L (ref 3.5–5.1)
Sodium: 134 mmol/L — ABNORMAL LOW (ref 135–145)
Total Bilirubin: 0.7 mg/dL (ref 0.3–1.2)
Total Protein: 6.7 g/dL (ref 6.5–8.1)

## 2019-03-25 LAB — CBC WITH DIFFERENTIAL/PLATELET
Abs Immature Granulocytes: 0.05 10*3/uL (ref 0.00–0.07)
Abs Immature Granulocytes: 0.06 10*3/uL (ref 0.00–0.07)
Basophils Absolute: 0 10*3/uL (ref 0.0–0.1)
Basophils Absolute: 0 10*3/uL (ref 0.0–0.1)
Basophils Relative: 0 %
Basophils Relative: 0 %
Eosinophils Absolute: 0 10*3/uL (ref 0.0–0.5)
Eosinophils Absolute: 0 10*3/uL (ref 0.0–0.5)
Eosinophils Relative: 0 %
Eosinophils Relative: 1 %
HCT: 25.9 % — ABNORMAL LOW (ref 39.0–52.0)
HCT: 30.3 % — ABNORMAL LOW (ref 39.0–52.0)
Hemoglobin: 7.5 g/dL — ABNORMAL LOW (ref 13.0–17.0)
Hemoglobin: 9.1 g/dL — ABNORMAL LOW (ref 13.0–17.0)
Immature Granulocytes: 1 %
Immature Granulocytes: 1 %
Lymphocytes Relative: 23 %
Lymphocytes Relative: 23 %
Lymphs Abs: 1.1 10*3/uL (ref 0.7–4.0)
Lymphs Abs: 1.1 10*3/uL (ref 0.7–4.0)
MCH: 26.9 pg (ref 26.0–34.0)
MCH: 27.3 pg (ref 26.0–34.0)
MCHC: 29 g/dL — ABNORMAL LOW (ref 30.0–36.0)
MCHC: 30 g/dL (ref 30.0–36.0)
MCV: 92.8 fL (ref 80.0–100.0)
MCV: 91 fL (ref 80.0–100.0)
Monocytes Absolute: 0.4 10*3/uL (ref 0.1–1.0)
Monocytes Absolute: 0.3 10*3/uL (ref 0.1–1.0)
Monocytes Relative: 7 %
Monocytes Relative: 7 %
Neutro Abs: 3.3 10*3/uL (ref 1.7–7.7)
Neutro Abs: 3.2 10*3/uL (ref 1.7–7.7)
Neutrophils Relative %: 69 %
Neutrophils Relative %: 68 %
Platelets: 325 10*3/uL (ref 150–400)
Platelets: 333 10*3/uL (ref 150–400)
RBC: 2.79 MIL/uL — ABNORMAL LOW (ref 4.22–5.81)
RBC: 3.33 MIL/uL — ABNORMAL LOW (ref 4.22–5.81)
RDW: 19.3 % — ABNORMAL HIGH (ref 11.5–15.5)
RDW: 18.5 % — ABNORMAL HIGH (ref 11.5–15.5)
WBC: 4.8 10*3/uL (ref 4.0–10.5)
WBC: 4.7 10*3/uL (ref 4.0–10.5)
nRBC: 1.2 % — ABNORMAL HIGH (ref 0.0–0.2)
nRBC: 1.3 % — ABNORMAL HIGH (ref 0.0–0.2)

## 2019-03-25 LAB — D-DIMER, QUANTITATIVE: D-Dimer, Quant: 5.51 ug/mL-FEU — ABNORMAL HIGH (ref 0.00–0.50)

## 2019-03-25 LAB — FERRITIN: Ferritin: 1564 ng/mL — ABNORMAL HIGH (ref 24–336)

## 2019-03-25 LAB — KAPPA/LAMBDA LIGHT CHAINS
Kappa free light chain: 62.9 mg/L — ABNORMAL HIGH (ref 3.3–19.4)
Kappa, lambda light chain ratio: 1.05 (ref 0.26–1.65)
Lambda free light chains: 59.9 mg/L — ABNORMAL HIGH (ref 5.7–26.3)

## 2019-03-25 LAB — GLUCOSE, CAPILLARY
Glucose-Capillary: 254 mg/dL — ABNORMAL HIGH (ref 70–99)
Glucose-Capillary: 234 mg/dL — ABNORMAL HIGH (ref 70–99)
Glucose-Capillary: 283 mg/dL — ABNORMAL HIGH (ref 70–99)
Glucose-Capillary: 193 mg/dL — ABNORMAL HIGH (ref 70–99)

## 2019-03-25 LAB — PREPARE RBC (CROSSMATCH)

## 2019-03-25 LAB — CANCER ANTIGEN 19-9: CA 19-9: <2 U/mL (ref 0–35)

## 2019-03-25 LAB — ABO/RH: ABO/RH(D): AB POS

## 2019-03-25 LAB — C-REACTIVE PROTEIN: CRP: 25 mg/dL — ABNORMAL HIGH (ref <1.0)

## 2019-03-25 MED ORDER — ONDANSETRON HCL 4 MG PO TABS: 4.0000 mg | ORAL_TABLET | Freq: Four times a day (QID) | ORAL | 0 refills | Status: DC | PRN

## 2019-03-25 MED ORDER — OXYCODONE-ACETAMINOPHEN 7.5-325 MG PO TABS: 1.0000 | ORAL_TABLET | Freq: Four times a day (QID) | ORAL | 0 refills | Status: AC | PRN

## 2019-03-25 MED ORDER — SODIUM CHLORIDE 0.9% IV SOLUTION: Freq: Once | INTRAVENOUS | Status: AC

## 2019-03-25 MED ORDER — LIDOCAINE 5 % EX PTCH: 2.0000 | MEDICATED_PATCH | CUTANEOUS | 0 refills | Status: AC

## 2019-03-25 NOTE — Discharge Summary (Signed)
Physician Discharge Summary  Stephen Lynch BUL:[id number] DOB: February 15, 1945 DOA: 03/23/2019  PCP: Patient, No Pcp Per  Admit date: 03/23/2019 Discharge date: 03/25/2019  Admitted From: Home  Discharge disposition: Home   Recommendations for Outpatient Follow-Up:   . Follow up with your primary care provider in one week.  . Check CBC, BMP in one week. . Follow-up with Dr Alen Blew oncology in 2 to 3 weeks to discuss about further treatment of possible prostate cancer   Discharge Diagnosis:   Principal Problem:   Back pain Active Problems:   Hypertension   Dyslipidemia   Diabetes mellitus without complication (HCC)   Metastasis to spinal column (HCC)   Symptomatic anemia   COVID-19 virus infection   Dilation of biliary tract   COPD (chronic obstructive pulmonary disease) ([place])   Discharge Condition: Improved.  Diet recommendation: Carbohydrate-modified.    Wound care: None.  Code status: Full.   History of Present Illness:   Stephen Lynch a 74 y.o.malewith medical history significant ofDM and HTN presented to the hospital with back pain, weight loss, decreased appetite, and poor balance x 10 months.He complained of excruciating pain all along his back and the sides of his back. It was painful with walking and has been getting worse. Hard to get out of bed independently. Having trouble with balance. Has been confused intermittently, attributes this to medication. Not eating well. He has lost about 100 pounds over the last 10 months. ED Course:Gradually worsening neck and back pain x 10 months, 100 pound weight loss. He has been seeing his PCP, "normal" x-rays last week. X-rays with diffuse spinal lesions and in pelvis. Dr. Alvy Bimler recommended admission with CT C/A/P.  Hemoglobin was 6.0 on presentation.   Hospital Course:   Following conditions were addressed during hospitalization as listed below,  Back pain likely from spinal metastatic disease.   Improved Patient presented with intractable back pain and significant weight loss, fatigue and difficulty ambulation.  CT showed  diffuse mixed lytic and sclerotic metastatic bone disease suspicious for metastatic prostate cancer; possible also with left adrenal metastasis.  Oncology was consulted and  Dr. Alvy Bimler evaluated the patient.    Multiple myeloma panel pending time of discharge.  Patient had negative CA 19-9.  Pending kappa/lambda light chains.   PSA elevated at 193.  Patient's pain improved with Percocet and tramadol.  Will give prescription for Percocet on discharge.   Patient was able to ambulate today.  Symptomatic anemia Likely secondary to metastatic cancer.  Hemoglobin of 6.1 on presentation which was normocytic. Stool Hemoccult was negative.  Received packed RBC 3 units during hospitalization.  Hemoglobin was 9.1 prior to discharge today.  COVID-19 infection: Incidental finding.  No hypoxia but a CT scan showed patchy ill-defined nodular densities in both lungs.    No antivirals were given.  Biliary dilatation. Likely secondary to  metastatic disease seen on the CT scan.   COPD. On Respiratory imaging.  No acute issues at this time  DM Type II. On Tresiba at home will resume on discharge.  Diabetic diet.  Essential HTN. Not on any medications.  Blood pressure prior to discharge was 140/ 73.  To discuss with primary care physician after discharge  Hyperlipidemia: Continue statins.  Disposition.  At this time, patient is stable for disposition home.  Patient will need to follow-up with primary care physician and oncology as outpatient.  I could not reach the patient's significant other today but have spoken with her yesterday regarding disposition.  Medical  Consultants:    Oncology  Procedures:    Transfusion of packed RBC  Subjective:   Today, patient feels much better with his back pain.  Denies shortness of breath, cough, fever or chills.  Discharge Exam:    Vitals:   03/25/19 1222 03/25/19 1340  BP: 129/66 140/73  Pulse: 76 78  Resp: 16 16  Temp: 98.3 F (36.8 C) (!) 97.4 F (36.3 C)  SpO2: 99% 100%   Vitals:   03/25/19 1000 03/25/19 1140 03/25/19 1222 03/25/19 1340  BP: 120/60 111/60 129/66 140/73  Pulse: 73 72 76 78  Resp: 16  16 16   Temp: 98 F (36.7 C) 98.4 F (36.9 C) 98.3 F (36.8 C) (!) 97.4 F (36.3 C)  TempSrc: Oral Oral Oral Oral  SpO2: 99% 99% 99% 100%  Weight:      Height:       General: Alert awake, not in obvious distress HENT: pupils equally reacting to light,  mild pallor noted.  Oral mucosa is moist.  Chest:  Clear breath sounds.  Diminished breath sounds bilaterally. No crackles or wheezes.  CVS: S1 &S2 heard. No murmur.  Regular rate and rhythm. Abdomen: Soft, mild right upper quadrant tenderness nondistended.  Bowel sounds are heard.  Mild back tenderness on palpation. Extremities: No cyanosis, clubbing or edema.  Peripheral pulses are palpable. Psych: Alert, awake and oriented, normal mood CNS:  No cranial nerve deficits.  Power equal in all extremities.   Skin: Warm and dry.  No rashes noted.  The results of significant diagnostics from this hospitalization (including imaging, microbiology, ancillary and laboratory) are listed below for reference.     Diagnostic Studies:   DG Cervical Spine Complete  Result Date: 03/23/2019 CLINICAL DATA:  Neck pain, pain in upper back and bilateral flank pain for 10 months EXAM: CERVICAL SPINE - COMPLETE 4+ VIEW COMPARISON:  Thoracic spine evaluation of the same date. FINDINGS: Prevertebral soft tissues are normal. Spinal degenerative changes are present, worse in the lower cervical spine, greatest at C7-T1. Alignment is preserved showing mild lordotic curvature, normal appearance. Neural foraminal narrowing due to facet hypertrophy and uncovertebral degenerative changes greatest at C5-6 and C6-7 on the right, also present on the left. IMPRESSION: Spinal degenerative  changes without signs of acute abnormality. Electronically Signed   By: Zetta Bills M.D.   On: 03/23/2019 11:13   DG Thoracic Spine 2 View  Result Date: 03/23/2019 CLINICAL DATA:  Back pain for 10 months. EXAM: THORACIC SPINE 2 VIEWS COMPARISON:  None. FINDINGS: There is patchy sclerosis throughout the thoracic spine with slight sclerosis of the lateral aspect of the right second rib. The findings are concerning for metastatic prostate cancer. No pathologic fracture or disc space narrowing. Aortic atherosclerosis. IMPRESSION: 1. Findings concerning for metastatic disease to the thoracic spine. The possibility of prostate cancer should be considered. 2.  Aortic Atherosclerosis (ICD10-I70.0). 3. No fractures. Electronically Signed   By: Lorriane Shire M.D.   On: 03/23/2019 11:15   DG Lumbar Spine Complete  Result Date: 03/23/2019 CLINICAL DATA:  Back pain for 10 months. EXAM: LUMBAR SPINE - COMPLETE 4+ VIEW COMPARISON:  None. FINDINGS: There is patchy sclerosis throughout the visualized portion of the spine with patchy areas of sclerosis in the pelvic bones. There is a slight anterior wedge deformity of the superior endplate of L1 which could represent a pathologic fracture. There is disc space narrowing at L5-S1. The other disc spaces are preserved. Lateral alignment is normal. Slight degenerative facet arthritis  in the lower lumbar spine. IMPRESSION: 1. Possible pathologic fracture of the superior endplate of L1. 2. Diffuse sclerotic metastatic disease in the visualized portion of the spine and pelvis. 3. Degenerative disc  disease at L5-S1. Electronically Signed   By: Lorriane Shire M.D.   On: 03/23/2019 11:17   CT Chest W Contrast  Result Date: 03/23/2019 CLINICAL DATA:  Sclerotic bone disease and pathologic fractures seen on radiographs. EXAM: CT CHEST, ABDOMEN, AND PELVIS WITH CONTRAST TECHNIQUE: Multidetector CT imaging of the chest, abdomen and pelvis was performed following the standard protocol  during bolus administration of intravenous contrast. CONTRAST:  112m OMNIPAQUE IOHEXOL 300 MG/ML  SOLN COMPARISON:  Radiographs, same date. FINDINGS: CT CHEST FINDINGS Cardiovascular: The heart is normal in size. No pericardial effusion. The aorta is normal in caliber. No dissection. The branch vessels are patent. Scattered atherosclerotic calcifications. Scattered coronary artery calcifications. Mediastinum/Nodes: No mediastinal or hilar mass or adenopathy. The esophagus is grossly normal. The thyroid gland is enlarged and there are multiple thyroid nodules. The largest nodule measures 13.5 mm. Substernal thyroid tissue noted on the right side adjacent to the esophagus. Lungs/Pleura: Emphysematous changes are noted. Patchy ill-defined nodular densities and both lungs possibly areas of inflammation or infection. Metastatic disease seems unlikely. Short-term follow-up chest CT in 3 months is suggested. No pleural effusion or pleural nodules. Musculoskeletal: Musculoskeletal diffuse mixed lytic and sclerotic metastatic bone disease involving all of the visualized osseous structures. I do not see any obvious pathologic fractures or spinal canal compromise. CT ABDOMEN PELVIS FINDINGS Hepatobiliary: No focal hepatic lesions are identified. There is mild intra hepatic biliary dilatation and moderate extrahepatic biliary dilatation. The common bile duct in the porta hepatis measures up to 18 mm and also measures a maximum of 12.5 mm in the head of the pancreas. It does appear to taper normally to the ampulla. No obvious obstructing common bile duct stone. The gallbladder is unremarkable. Pancreas: The pancreatic head appears normal. Normal caliber main pancreatic duct in the pancreatic head. However, the pancreatic body and tail are significantly atrophied and there is fairly marked distention of the main pancreatic duct. Do not see an obvious obstructing mass at the head body junction region. Moderate-sized duodenal  diverticulum noted near the pancreatic head. Spleen: Normal size. No focal lesions. Adrenals/Urinary Tract: A left adrenal gland lesion measures 2.5 x 1.5 cm and could be a metastatic focus. No worrisome renal lesions or hydronephrosis. The bladder appears normal. Stomach/Bowel: The stomach, duodenum, small bowel and colon are grossly normal. No acute inflammatory changes, mass lesions or obstructive findings. The terminal ileum and appendix are normal. Vascular/Lymphatic: The aorta and branch vessels are patent. The major venous structures are patent. Small scattered paraduodenal lymph nodes measuring a maximum of 9.5 mm. Small scattered left-sided retroperitoneal lymph nodes. The largest measures 10 mm on image 70/2. No pelvic adenopathy. Small scattered pelvic lymph nodes are noted. No inguinal adenopathy. Reproductive: The prostate gland and seminal vesicles are grossly normal. Other: No pelvic mass or adenopathy. No free pelvic fluid collections. No inguinal mass or adenopathy. No abdominal wall hernia or subcutaneous lesions. Musculoskeletal: Extensive mixed lytic and sclerotic metastatic bone disease without evidence of pathologic fracture. IMPRESSION: 1. Diffuse mixed lytic and sclerotic metastatic bone disease without evidence of pathologic fracture or spinal canal compromise. Findings suspicious for metastatic prostate cancer. Recommend correlation with PSA level. 2. 2.5 x 1.5 cm left adrenal gland lesion suspicious for metastatic disease. 3. Intra and extrahepatic biliary dilatation without obvious cause. Recommend  correlation with liver function studies. ERCP or MRCP may be helpful for further evaluation. 4. Atrophy of the pancreatic body and tail and distended main pancreatic duct. No obvious mass at the head neck junction region. Possible stricture. MRI may be helpful for further evaluation. 5. Emphysematous changes and patchy ill-defined nodular densities in both lungs, unlikely metastatic disease.  Recommend short-term follow-up noncontrast chest CT in 3 months. Aortic Atherosclerosis (ICD10-I70.0) and Emphysema (ICD10-J43.9). Electronically Signed   By: Marijo Sanes M.D.   On: 03/23/2019 14:43   CT ABDOMEN PELVIS W CONTRAST  Result Date: 03/23/2019 CLINICAL DATA:  Sclerotic bone disease and pathologic fractures seen on radiographs. EXAM: CT CHEST, ABDOMEN, AND PELVIS WITH CONTRAST TECHNIQUE: Multidetector CT imaging of the chest, abdomen and pelvis was performed following the standard protocol during bolus administration of intravenous contrast. CONTRAST:  123m OMNIPAQUE IOHEXOL 300 MG/ML  SOLN COMPARISON:  Radiographs, same date. FINDINGS: CT CHEST FINDINGS Cardiovascular: The heart is normal in size. No pericardial effusion. The aorta is normal in caliber. No dissection. The branch vessels are patent. Scattered atherosclerotic calcifications. Scattered coronary artery calcifications. Mediastinum/Nodes: No mediastinal or hilar mass or adenopathy. The esophagus is grossly normal. The thyroid gland is enlarged and there are multiple thyroid nodules. The largest nodule measures 13.5 mm. Substernal thyroid tissue noted on the right side adjacent to the esophagus. Lungs/Pleura: Emphysematous changes are noted. Patchy ill-defined nodular densities and both lungs possibly areas of inflammation or infection. Metastatic disease seems unlikely. Short-term follow-up chest CT in 3 months is suggested. No pleural effusion or pleural nodules. Musculoskeletal: Musculoskeletal diffuse mixed lytic and sclerotic metastatic bone disease involving all of the visualized osseous structures. I do not see any obvious pathologic fractures or spinal canal compromise. CT ABDOMEN PELVIS FINDINGS Hepatobiliary: No focal hepatic lesions are identified. There is mild intra hepatic biliary dilatation and moderate extrahepatic biliary dilatation. The common bile duct in the porta hepatis measures up to 18 mm and also measures a maximum  of 12.5 mm in the head of the pancreas. It does appear to taper normally to the ampulla. No obvious obstructing common bile duct stone. The gallbladder is unremarkable. Pancreas: The pancreatic head appears normal. Normal caliber main pancreatic duct in the pancreatic head. However, the pancreatic body and tail are significantly atrophied and there is fairly marked distention of the main pancreatic duct. Do not see an obvious obstructing mass at the head body junction region. Moderate-sized duodenal diverticulum noted near the pancreatic head. Spleen: Normal size. No focal lesions. Adrenals/Urinary Tract: A left adrenal gland lesion measures 2.5 x 1.5 cm and could be a metastatic focus. No worrisome renal lesions or hydronephrosis. The bladder appears normal. Stomach/Bowel: The stomach, duodenum, small bowel and colon are grossly normal. No acute inflammatory changes, mass lesions or obstructive findings. The terminal ileum and appendix are normal. Vascular/Lymphatic: The aorta and branch vessels are patent. The major venous structures are patent. Small scattered paraduodenal lymph nodes measuring a maximum of 9.5 mm. Small scattered left-sided retroperitoneal lymph nodes. The largest measures 10 mm on image 70/2. No pelvic adenopathy. Small scattered pelvic lymph nodes are noted. No inguinal adenopathy. Reproductive: The prostate gland and seminal vesicles are grossly normal. Other: No pelvic mass or adenopathy. No free pelvic fluid collections. No inguinal mass or adenopathy. No abdominal wall hernia or subcutaneous lesions. Musculoskeletal: Extensive mixed lytic and sclerotic metastatic bone disease without evidence of pathologic fracture. IMPRESSION: 1. Diffuse mixed lytic and sclerotic metastatic bone disease without evidence of pathologic fracture  or spinal canal compromise. Findings suspicious for metastatic prostate cancer. Recommend correlation with PSA level. 2. 2.5 x 1.5 cm left adrenal gland lesion  suspicious for metastatic disease. 3. Intra and extrahepatic biliary dilatation without obvious cause. Recommend correlation with liver function studies. ERCP or MRCP may be helpful for further evaluation. 4. Atrophy of the pancreatic body and tail and distended main pancreatic duct. No obvious mass at the head neck junction region. Possible stricture. MRI may be helpful for further evaluation. 5. Emphysematous changes and patchy ill-defined nodular densities in both lungs, unlikely metastatic disease. Recommend short-term follow-up noncontrast chest CT in 3 months. Aortic Atherosclerosis (ICD10-I70.0) and Emphysema (ICD10-J43.9). Electronically Signed   By: Marijo Sanes M.D.   On: 03/23/2019 14:43     Labs:   Basic Metabolic Panel: Recent Labs  Lab 03/23/19 1021 03/25/19 0233  NA 132* 134*  K 4.1 4.2  CL 93* 100  CO2 25 25  GLUCOSE 358* 256*  BUN 16 24*  CREATININE 0.88 0.73  CALCIUM 8.5* 8.2*   GFR Estimated Creatinine Clearance: 94.3 mL/min (by C-G formula based on SCr of 0.73 mg/dL). Liver Function Tests: Recent Labs  Lab 03/23/19 1021 03/25/19 0233  AST 13* 25  ALT 22 25  ALKPHOS 375* 273*  BILITOT 0.7 0.7  PROT 6.8 6.7  ALBUMIN 2.2* 2.3*   No results for input(s): LIPASE, AMYLASE in the last 168 hours. No results for input(s): AMMONIA in the last 168 hours. Coagulation profile No results for input(s): INR, PROTIME in the last 168 hours.  CBC: Recent Labs  Lab 03/23/19 1021 03/24/19 1753 03/25/19 0233 03/25/19 1422  WBC 8.5 6.1 4.8 4.7  NEUTROABS 6.3  --  3.3 3.2  HGB 6.1* 7.1* 7.5* 9.1*  HCT 20.8* 24.4* 25.9* 30.3*  MCV 87.8 89.7 92.8 91.0  PLT 353 335 325 333   Cardiac Enzymes: No results for input(s): CKTOTAL, CKMB, CKMBINDEX, TROPONINI in the last 168 hours. BNP: Invalid input(s): POCBNP CBG: Recent Labs  Lab 03/24/19 1241 03/24/19 1732 03/24/19 2131 03/25/19 0808 03/25/19 1146  GLUCAP 254* 210* 280* 234* 283*   D-Dimer Recent Labs     03/25/19 0233  DDIMER 5.51*   Hgb A1c No results for input(s): HGBA1C in the last 72 hours. Lipid Profile No results for input(s): CHOL, HDL, LDLCALC, TRIG, CHOLHDL, LDLDIRECT in the last 72 hours. Thyroid function studies No results for input(s): TSH, T4TOTAL, T3FREE, THYROIDAB in the last 72 hours.  Invalid input(s): FREET3 Anemia work up [employer]    03/25/19 [place] 1,564*   Microbiology Recent Results (from the past 240 hour(s))  SARS CORONAVIRUS 2 (TAT 6-24 HRS) Nasopharyngeal Nasopharyngeal Swab     Status: Abnormal   Collection Time: 03/23/19 12:24 PM   Specimen: Nasopharyngeal Swab  Result Value Ref Range Status   SARS Coronavirus 2 POSITIVE (A) NEGATIVE Final    Comment: RESULT CALLED TO, READ BACK BY AND VERIFIED WITH: E ADKINS,RN 1710 03/23/2019 D BRADLEY (NOTE) SARS-CoV-2 target nucleic acids are DETECTED. The SARS-CoV-2 RNA is generally detectable in upper and lower respiratory specimens during the acute phase of infection. Positive results are indicative of the presence of SARS-CoV-2 RNA. Clinical correlation with patient history and other diagnostic information is  necessary to determine patient infection status. Positive results do not rule out bacterial infection or co-infection with other viruses.  The expected result is Negative. Fact Sheet for Patients: SugarRoll.be Fact Sheet for Healthcare Providers: https://www.woods-mathews.com/ This test is not yet approved or cleared  by the [place] and  has been authorized for detection and/or diagnosis of SARS-CoV-2 by FDA under an Emergency Use Authorization (EUA). This EUA will remain  in effect (meaning this test can be used) for the  duration of the COVID-19 declaration under Section 564(b)(1) of the Act, 21 U.S.C. section 360bbb-3(b)(1), unless the authorization is terminated or revoked sooner. Performed at [facility], [place] [address]  [place], [place] [address]      Discharge Instructions:   Discharge Instructions    Diet general   Complete by: As directed    Discharge instructions   Complete by: As directed    Follow up with Dr Alen Blew, oncology after discharge. Office to contact you.  If you do not hear back in few days, please call the office.  Follow-up with your primary care physician in 1 week.  Continue medications as prescribed.   Increase activity slowly   Complete by: As directed    MyChart COVID-19 home monitoring program   Complete by: Mar 25, 2019    Is the patient willing to use the [place] for home monitoring?: No     Allergies as of 03/25/2019   No Known Allergies     Medication List    TAKE these medications   acetaminophen 500 MG tablet Commonly known as: TYLENOL Take 500 mg by mouth every 6 (six) hours as needed for mild pain.   ASPIRIN 81 PO Take 81 mg by mouth daily.   Calcium-Magnesium-Zinc 333-133-5 MG Tabs Take 1 tablet by mouth daily.   ibuprofen 200 MG tablet Commonly known as: ADVIL Take 200 mg by mouth every 6 (six) hours as needed for moderate pain.   insulin lispro 100 UNIT/ML KwikPen Junior Generic drug: insulin lispro Inject 0.2 mLs (20 Units total) into the skin 3 (three) times daily after meals. What changed:   how much to take  additional instructions   lidocaine 5 % Commonly known as: LIDODERM Place 2 patches onto the skin daily. Remove & Discard patch within 12 hours or as directed by MD Start taking on: March 26, 2019   ondansetron 4 MG tablet Commonly known as: ZOFRAN Take 1 tablet (4 mg total) by mouth every 6 (six) hours as needed for nausea.   oxyCODONE-acetaminophen 7.5-325 MG tablet Commonly known as: Percocet Take 1 tablet by mouth every 6 (six) hours as needed for up to 5 days for severe pain.   rosuvastatin 10 MG tablet Commonly known as: CRESTOR Take 1 tablet (10 mg total) by mouth daily.   TIGER BALM ARTHRITIS RUB EX Apply 1  application topically 2 (two) times daily as needed (back pain).   [name] FlexTouch 100 UNIT/ML Sopn FlexTouch Pen Generic drug: insulin degludec Inject 10 Units into the skin at bedtime.   Ultram 50 MG tablet Generic drug: traMADol Take 50 mg by mouth every 6 (six) hours as needed for pain.      Follow-up Information    [name], MD. Go in 2 week(s).   Specialty: Oncology Why: office to contact for followup Contact information: [place] [address] [phone]        Your primary care provider. Schedule an appointment as soon as possible for a visit in 1 week(s).   Why: for regular followup         Time coordinating discharge: 39 minutes  Signed:  Deedra Pro  [facility] 03/25/2019, 3:25 PM

## 2019-03-25 NOTE — Evaluation (Signed)
Physical Therapy Evaluation Patient Details Name: Stephen Lynch MRN: [id number] DOB: 02/13/1945 Today's Date: 03/25/2019   History of Present Illness  74 y.o. male with medical history significant of DM and HTN presenting with back pain, weight loss, decreased appetite, and poor balance x 10 months.  He has excruciating pain all along his back and the sides of his back. 100 pound weight loss in 10 months. Dx of spinal mets, likely prostate cancer.    Clinical Impression  Stephen Lynch is 74 y.o. male admitted with above HPI and diagnosis. Patient is currently limited by functional impairments below (see PT problem list). Patient lives with his fiance and is independent at baseline. Patient does not require skilled acute PT services and is mobilizing at mod I and supervision level. He is safe to mobilize with Rn staff at this time. Patient will benefit from follow PT interventions in OPPT setting to address impairments including back pain and mild balance impairments. Acute PT will sign off, please re-consult if there is a change in functional status.    Follow Up Recommendations Outpatient PT(for balance and strengthening, and for back pain)    Equipment Recommendations  None recommended by PT    Recommendations for Other Services       Precautions / Restrictions Precautions Precautions: Fall Restrictions Weight Bearing Restrictions: No      Mobility  Bed Mobility Overal bed mobility: Modified Independent Bed Mobility: Supine to Sit     Supine to sit: Modified independent (Device/Increase time)        Transfers Overall transfer level: Modified independent Equipment used: None             General transfer comment: Mod independent for sit<>stand  Ambulation/Gait Ambulation/Gait assistance: Supervision Gait Distance (Feet): 40 Feet Assistive device: None Gait Pattern/deviations: Step-through pattern;WFL(Within Functional Limits)   Gait velocity interpretation: >2.62  ft/sec, indicative of community ambulatory General Gait Details: no overt LOB noted, pt stide/gait pattern WNL's  Stairs            Wheelchair Mobility    Modified Rankin (Stroke Patients Only)       Balance Overall balance assessment: Mild deficits observed, not formally tested   Sitting balance-Leahy Scale: Normal     Standing balance support: During functional activity Standing balance-Leahy Scale: Good   Single Leg Stance - Right Leg: 4 Single Leg Stance - Left Leg: 6                Pertinent Vitals/Pain Pain Assessment: 0-10 Pain Score: 4  Pain Location: mid to low back bilaterally Pain Descriptors / Indicators: Aching Pain Intervention(s): Monitored during session    Home Living Family/patient expects to be discharged to:: Private residence Living Arrangements: Spouse/significant other Available Help at Discharge: Available 24 hours/day Type of Home: House Home Access: Stairs to enter Entrance Stairs-Rails: None Entrance Stairs-Number of Steps: 2 Home Layout: Two level;Bed/bath upstairs Home Equipment: Cane - single point      Prior Function Level of Independence: Independent         Comments: pt reports he jsut retired from the [place], he servied in [employer].     Hand Dominance   Dominant Hand: Right    Extremity/Trunk Assessment   Upper Extremity Assessment Upper Extremity Assessment: Overall WFL for tasks assessed    Lower Extremity Assessment Lower Extremity Assessment: Overall WFL for tasks assessed    Cervical / Trunk Assessment Cervical / Trunk Assessment: Normal  Communication   Communication: No difficulties  Cognition  Arousal/Alertness: Awake/alert Behavior During Therapy: WFL for tasks assessed/performed Overall Cognitive Status: No family/caregiver present to determine baseline cognitive functioning      General Comments: pt oriented to self, and place but not date/time. pt seems aware of situation.      General  Comments General comments (skin integrity, edema, etc.): 5x Sit<>Stand testing: 31.57 seconds with use of bil UE's on bed for power up.    Exercises     Assessment/Plan    PT Assessment Patent does not need any further PT services  PT Problem List Decreased mobility;Decreased balance;Decreased activity tolerance       PT Treatment Interventions Patient/family education;Balance training;Therapeutic activities;Gait training;Functional mobility training    PT Goals (Current goals can be found in the Care Plan section)  Acute Rehab PT Goals PT Goal Formulation: All assessment and education complete, DC therapy    Frequency (1x eval/treat)    AM-PAC PT "6 Clicks" Mobility  Outcome Measure Help needed turning from your back to your side while in a flat bed without using bedrails?: None Help needed moving from lying on your back to sitting on the side of a flat bed without using bedrails?: None Help needed moving to and from a bed to a chair (including a wheelchair)?: None Help needed standing up from a chair using your arms (e.g., wheelchair or bedside chair)?: None Help needed to walk in hospital room?: A Little Help needed climbing 3-5 steps with a railing? : A Little 6 Click Score: 22    End of Session   Activity Tolerance: Patient tolerated treatment well Patient left: in bed;with call bell/phone within reach;with bed alarm set Nurse Communication: Mobility status PT Visit Diagnosis: Pain;Difficulty in walking, not elsewhere classified (R26.2) Pain - part of body: (back)    Time: 1710-1729 PT Time Calculation (min) (ACUTE ONLY): 19 min   Charges:   PT Evaluation $PT Eval Low Complexity: 1 Low          Verner Mould, DPT Physical Therapist with [facility] [phone]  03/25/2019 5:51 PM

## 2019-03-25 NOTE — Progress Notes (Signed)
PT Cancellation Note  Patient Details Name: Jakaiden Jabs MRN: [id number] DOB: Mar 03, 1945   Cancelled Treatment:    Reason Eval/Treat Not Completed: Medical issues which prohibited therapy(will see after blood transfusion.)  Philomena Doheny PT 03/25/2019  Acute Rehabilitation Services Pager [phone] Office [phone]

## 2019-03-25 NOTE — Progress Notes (Signed)
Inpatient Diabetes Program Recommendations  AACE/ADA: New Consensus Statement on Inpatient Glycemic Control (2015)  Target Ranges:  Prepandial:   less than 140 mg/dL      Peak postprandial:   less than 180 mg/dL (1-2 hours)      Critically ill patients:  140 - 180 mg/dL   Lab Results  Component Value Date   GLUCAP 283 (H) 03/25/2019    Review of Glycemic Control  Diabetes history: DM2 Outpatient Diabetes medications: Tresiba 10 units QHS, Humalog 7-25 units tidwc Current orders for Inpatient glycemic control: Lantus 10 units QHS, Novolog 0-9 units tidwc  Blood sugars above goal of 140-180 mg/dL.  Inpatient Diabetes Program Recommendations:     Increase Lantus to 12 units QHS Add Novolog 3-4 units tidwc for meal coverage insulin.  Will follow glucose trends.  Thank you. Lorenda Peck, RD, LDN, CDE Inpatient Diabetes Coordinator [phone]

## 2019-03-25 NOTE — Progress Notes (Signed)
I reviewed with Dr. Alen Blew, looks like probable metastatic prostate cancer Dr. Alen Blew will schedule outpatient follow-up at the cancer center I advise another unit of blood transfusion to keep hemoglobin >8 before discharge

## 2019-03-26 LAB — TYPE AND SCREEN
ABO/RH(D): AB POS
Antibody Screen: NEGATIVE
Unit division: 0
Unit division: 0

## 2019-03-26 LAB — BPAM RBC
Blood Product Expiration Date: 202103092359
Blood Product Expiration Date: 202103092359
ISSUE DATE / TIME: 202102102159
ISSUE DATE / TIME: 202102111152
Unit Type and Rh: 6200
Unit Type and Rh: 6200

## 2019-03-26 LAB — MULTIPLE MYELOMA PANEL, SERUM
Albumin SerPl Elph-Mcnc: 2.2 g/dL — ABNORMAL LOW (ref 2.9–4.4)
Albumin/Glob SerPl: 0.6 — ABNORMAL LOW (ref 0.7–1.7)
Alpha 1: 0.6 g/dL — ABNORMAL HIGH (ref 0.0–0.4)
Alpha2 Glob SerPl Elph-Mcnc: 1.1 g/dL — ABNORMAL HIGH (ref 0.4–1.0)
B-Globulin SerPl Elph-Mcnc: 1 g/dL (ref 0.7–1.3)
Gamma Glob SerPl Elph-Mcnc: 1.3 g/dL (ref 0.4–1.8)
Globulin, Total: 4 g/dL — ABNORMAL HIGH (ref 2.2–3.9)
IgA: 249 mg/dL (ref 61–437)
IgG (Immunoglobin G), Serum: 1386 mg/dL (ref 603–1613)
IgM (Immunoglobulin M), Srm: 71 mg/dL (ref 15–143)
Total Protein ELP: 6.2 g/dL (ref 6.0–8.5)

## 2019-03-29 ENCOUNTER — Other Ambulatory Visit: Payer: Self-pay | Admitting: *Deleted

## 2019-03-29 NOTE — Patient Outreach (Signed)
[facility]) Care Management  03/29/2019  Stephen Lynch [birth year] [id number]   RED ON EMMI ALERT - General Discharge Day # 1 Date: 2/14 Red Alert Reason: Not read discharge papers, Unsure who to call with changes/questions, no follow up scheduled   Outreach attempt #1, unsuccessful.  Unable to leave a voice message as mailbox is full.   Plan: RN CM will send unsuccessful outreach letter and follow up within the next 3-4 business days.  [name], [place], MSN [facility] [phone]

## 2019-03-30 ENCOUNTER — Telehealth: Payer: Self-pay | Admitting: Oncology

## 2019-03-30 NOTE — Telephone Encounter (Signed)
Received a new pt referral from the hospital for metastatic prostate cancer. [name] was originally scheduled for 2/18 but pt states that he is in quarantine due to covid dx. Pt appt has been moved to 3/2 at 2pm. I cld and lft his fiance the new appt date and time on her voicemail.

## 2019-04-01 ENCOUNTER — Other Ambulatory Visit: Payer: Self-pay | Admitting: *Deleted

## 2019-04-01 ENCOUNTER — Ambulatory Visit: Payer: Medicare Other | Admitting: Oncology

## 2019-04-01 NOTE — Patient Outreach (Signed)
[facility]) Care Management  04/01/2019  Kincaid Pebley 30-Mar-1945 [id number]    RED ON EMMI ALERT - General Discharge Day # 1 Date: 2/14 Red Alert Reason: Not read discharge papers, Unsure who to call with changes/questions, no follow up scheduled  Second EMMI alert - General Dischage Day #4 Date: 217 Red Alert Reason - No follow up appointment scheduled  Outreach attempt #2, initially unsuccessful however member immediately called back and identity verified.  This care manager introduced self and stated purpose of call, [facility] care management services explained.  He was hospitalized for symptomatic anemia, recently diagnosed with metastatic prostate cancer.  During hospitalization, he was also noted to be positive for Covid 19 infection however presented no symptoms.  Per chart, he also has history of hypertension, COPD, diabetes, and dyslipidemia.    Member report he is doing "alright," state he is still in quarantine.  There is confusion as to if he should be in isolation for 14 days versus 10 days.  Significant other report she was told that he only had to stay in for 10 days because he was asymptomatic.  He continue to deny any symptoms.  He does seem to be having a difficult time expressing his words when describing his condition and management of diabetes.  Significant other report this may be a symptom of the cancer. He was advised to schedule appointments with oncology and PCP but this has not been done due to him being on isolation. Advised that he could go ahead and schedule appointment for after his quarantine was over.  Noted that he already have appointment for oncology scheduled for 3/2, significant other informed.  She will call PCP office (report he is seeing Dr. Clayburn Pert) to schedule visit.   Attempted to review medications with member however he seemed confused as to what he was taking, stated it would be better to ask [name].  She report she has not had much to  do with his medications because he would manage them on his own but she will start helping him with management.  She will have PCP review list during visit, unable to do so at this time because he is still in isolation and medications are in the room with him.  His quarantine is over with this weekend and she will be able to help him more.    Member aware that his A1C is elevated, state he had lost his meter (left it in [place]).  State he now has a new one and is checking several times a day, report this morning's reading at 128.  Significant other concerned that his A1C may be elevated due to the stress of his condition as he has not been having a big appetite and has lost weight.  She is hoping to have more questions answered once they are able to see the oncologist.    They both deny any urgent concerns, agree to continued follow up. Will follow up within the next 2 weeks.  Fall Risk  04/01/2019  Falls in the past year? 0  Number falls in past yr: 0  Injury with Fall? 0  Risk for fall due to : No Fall Risks  Follow up Falls prevention discussed   Depression screen [facility] 2/9 04/01/2019  Decreased Interest 0  Down, Depressed, Hopeless 0  PHQ - 2 Score 0     THN CM Care Plan Problem One     Most Recent Value  Care Plan Problem One  Risk  for readmisison related to cancer diagnosis as evidenced by recent hospitalization  Role Documenting the Problem One  Care Management [place] for Problem One  Active  [facility] Long Term Goal   Member will not be readmitted to hospital within the next 31 days.  THN Long Term Goal Start Date  04/01/19  Interventions for Problem One Long Term Goal  Reviewed discharge instructions with member and significant other.  Educated on importance of following plan of care (diet, appointments, medications) in effort to decrease risk of readmission  THN CM Short Term Goal #1   Member will keep and attend follow up appointment with oncology within the next 2 weeks   THN CM Short Term Goal #1 Start Date  04/01/19  Interventions for Short Term Goal #1  Significant other informed of oncology appointment details, assessed the need for transportation assistance, none needed  THN CM Short Term Goal #2   Member will report taking medications as instructed over the next 4 weeks  THN CM Short Term Goal #2 Start Date  04/01/19  Interventions for Short Term Goal #2  Attempted to review medications with both member and significant other, advised to contact PCP office for appointment and review of medications     [name], RN, MSN [place] [phone]   Plan: RN CM will

## 2019-04-07 ENCOUNTER — Other Ambulatory Visit: Payer: Self-pay | Admitting: Internal Medicine

## 2019-04-09 DIAGNOSIS — C61 Malignant neoplasm of prostate: Secondary | ICD-10-CM | POA: Diagnosis not present

## 2019-04-13 ENCOUNTER — Other Ambulatory Visit: Payer: Self-pay

## 2019-04-13 ENCOUNTER — Encounter: Payer: Self-pay | Admitting: Medical Oncology

## 2019-04-13 ENCOUNTER — Inpatient Hospital Stay: Payer: Medicare Other | Attending: Oncology | Admitting: Oncology

## 2019-04-13 VITALS — BP 150/56 | HR 95 | Temp 98.7°F | Resp 18 | Ht 72.0 in | Wt 180.7 lb

## 2019-04-13 DIAGNOSIS — C7951 Secondary malignant neoplasm of bone: Secondary | ICD-10-CM | POA: Diagnosis not present

## 2019-04-13 DIAGNOSIS — Z5111 Encounter for antineoplastic chemotherapy: Secondary | ICD-10-CM | POA: Insufficient documentation

## 2019-04-13 DIAGNOSIS — C61 Malignant neoplasm of prostate: Secondary | ICD-10-CM | POA: Diagnosis not present

## 2019-04-13 NOTE — Progress Notes (Signed)
Reason for the request:    Metastatic cancer to the bone.  HPI: I was asked by Dr. Louanne Belton  to evaluate Stephen Lynch for the evaluation of advanced malignancy.  He is a 74 year old man hospitalized on February 9 after presenting with back pain.  His evaluation at that time included a CT scan chest abdomen and pelvis which showed diffuse lytic and sclerotic bone lesions without any evidence of fractures.  He also had an adrenal gland involvement that is suspicious for metastasis.  His hemoglobin was 6.1 and received packed red cell transfusion with his hemoglobin improving to 9.1 on February 11.  His PSA was 193.  He was discharged to follow-up today for further evaluation regarding these findings.  He was given Ultram which she has been taking for his pain.  Since his discharge, he does report overall being quite debilitated with very limited mobility at this time.  He denies any neurological deficits and moving extremities without any issues.  He does report close to 80 pound weight loss in the last year with overall decline in his performance status around that time.  His pain is manageable at this time.   He does not report any headaches, blurry vision, syncope or seizures. Does not report any fevers, chills or sweats.  Does not report any cough, wheezing or hemoptysis.  Does not report any chest pain, palpitation, orthopnea or leg edema.  Does not report any nausea, vomiting or abdominal pain.  Does not report any constipation or diarrhea.  Does not report any skeletal complaints.    Does not report frequency, urgency or hematuria.  Does not report any skin rashes or lesions. Does not report any heat or cold intolerance.  Does not report any lymphadenopathy or petechiae.  Does not report any anxiety or depression.  Remaining review of systems is negative.    Past Medical History:  Diagnosis Date  . Diabetes mellitus without complication ([place])   . Dyslipidemia   . Hypertension   :  Past Surgical  History:  Procedure Laterality Date  . EYE SURGERY    :   Current Outpatient Medications:  .  acetaminophen (TYLENOL) 500 MG tablet, Take 500 mg by mouth every 6 (six) hours as needed for mild pain., Disp: , Rfl:  .  ASPIRIN 81 PO, Take 81 mg by mouth daily. , Disp: , Rfl:  .  Calcium-Magnesium-Zinc 333-133-5 MG TABS, Take 1 tablet by mouth daily., Disp: , Rfl:  .  ibuprofen (ADVIL) 200 MG tablet, Take 200 mg by mouth every 6 (six) hours as needed for moderate pain., Disp: , Rfl:  .  insulin lispro (HUMALOG) 100 UNIT/ML KwikPen Junior, Inject 0.2 mLs (20 Units total) into the skin 3 (three) times daily after meals. (Patient taking differently: Inject 7-25 Units into the skin 3 (three) times daily after meals. Per sliding scale), Disp: 3 mL, Rfl: 6 .  lidocaine (LIDODERM) 5 %, Place 2 patches onto the skin daily. Remove & Discard patch within 12 hours or as directed by MD, Disp: 30 patch, Rfl: 0 .  Menthol-Camphor (TIGER BALM ARTHRITIS RUB EX), Apply 1 application topically 2 (two) times daily as needed (back pain)., Disp: , Rfl:  .  ondansetron (ZOFRAN) 4 MG tablet, Take 1 tablet (4 mg total) by mouth every 6 (six) hours as needed for nausea., Disp: 20 tablet, Rfl: 0 .  rosuvastatin (CRESTOR) 10 MG tablet, Take 1 tablet (10 mg total) by mouth daily., Disp: 90 tablet, Rfl: 3 .  TRESIBA FLEXTOUCH 100 UNIT/ML SOPN FlexTouch Pen, Inject 10 Units into the skin at bedtime. , Disp: , Rfl:  .  ULTRAM 50 MG tablet, Take 50 mg by mouth every 6 (six) hours as needed for pain., Disp: , Rfl: :  No Known Allergies:  Family History  Problem Relation Age of Onset  . CAD Mother 45  . Other Father 26       age  . Other Brother 15       unknown cause  :  Social History   Socioeconomic History  . Marital status: Legally Separated    Spouse name: Not on file  . Number of children: Not on file  . Years of education: Not on file  . Highest education level: Not on file  Occupational History  .  Occupation: retired Air cabin crew at the [employer]  . Smoking status: Former Smoker    Packs/day: 0.50    Years: 15.00    Pack years: 7.50  . Smokeless tobacco: Never Used  Substance and Sexual Activity  . Alcohol use: Not Currently    Comment: h/o heavy use, quit about 2010  . Drug use: Not Currently    Comment: h/o use with cocaine, marijuana, quit about 2010  . Sexual activity: Not on file  Other Topics Concern  . Not on file  Social History Narrative  . Not on file   Social Determinants of Health   Financial Resource Strain:   . Difficulty of Paying Living Expenses: Not on file  Food Insecurity: No Food Insecurity  . Worried About Charity fundraiser in the Last Year: Never true  . Ran Out of Food in the Last Year: Never true  Transportation Needs: No Transportation Needs  . Lack of Transportation (Medical): No  . Lack of Transportation (Non-Medical): No  Physical Activity:   . Days of Exercise per Week: Not on file  . Minutes of Exercise per Session: Not on file  Stress:   . Feeling of Stress : Not on file  Social Connections:   . Frequency of Communication with Friends and Family: Not on file  . Frequency of Social Gatherings with Friends and Family: Not on file  . Attends Religious Services: Not on file  . Active Member of Clubs or Organizations: Not on file  . Attends Archivist Meetings: Not on file  . Marital Status: Not on file  Intimate Partner Violence:   . Fear of Current or Ex-Partner: Not on file  . Emotionally Abused: Not on file  . Physically Abused: Not on file  . Sexually Abused: Not on file  :  Pertinent items are noted in HPI.  Exam: Blood pressure (!) 150/56, pulse 95, temperature 98.7 F (37.1 C), temperature source Temporal, resp. rate 18, height 6' (1.829 m), weight 180 lb 11.2 oz (82 kg), SpO2 100 %.   ECOG 1   General appearance: alert and cooperative appeared without distress. Head: atraumatic without any  abnormalities. Eyes: conjunctivae/corneas clear. PERRL.  Sclera anicteric. Throat: lips, mucosa, and tongue normal; without oral thrush or ulcers. Resp: clear to auscultation bilaterally without rhonchi, wheezes or dullness to percussion. Cardio: regular rate and rhythm, S1, S2 normal, no murmur, click, rub or gallop GI: soft, non-tender; bowel sounds normal; no masses,  no organomegaly Skin: Skin color, texture, turgor normal. No rashes or lesions Lymph nodes: Cervical, supraclavicular, and axillary nodes normal. Neurologic: Grossly normal without any motor, sensory or deep tendon reflexes. Musculoskeletal: No  joint deformity or effusion.      CT Chest W Contrast  Result Date: 03/23/2019 CLINICAL DATA:  Sclerotic bone disease and pathologic fractures seen on radiographs. EXAM: CT CHEST, ABDOMEN, AND PELVIS WITH CONTRAST TECHNIQUE: Multidetector CT imaging of the chest, abdomen and pelvis was performed following the standard protocol during bolus administration of intravenous contrast. CONTRAST:  136mL OMNIPAQUE IOHEXOL 300 MG/ML  SOLN COMPARISON:  Radiographs, same date. FINDINGS: CT CHEST FINDINGS Cardiovascular: The heart is normal in size. No pericardial effusion. The aorta is normal in caliber. No dissection. The branch vessels are patent. Scattered atherosclerotic calcifications. Scattered coronary artery calcifications. Mediastinum/Nodes: No mediastinal or hilar mass or adenopathy. The esophagus is grossly normal. The thyroid gland is enlarged and there are multiple thyroid nodules. The largest nodule measures 13.5 mm. Substernal thyroid tissue noted on the right side adjacent to the esophagus. Lungs/Pleura: Emphysematous changes are noted. Patchy ill-defined nodular densities and both lungs possibly areas of inflammation or infection. Metastatic disease seems unlikely. Short-term follow-up chest CT in 3 months is suggested. No pleural effusion or pleural nodules. Musculoskeletal:  Musculoskeletal diffuse mixed lytic and sclerotic metastatic bone disease involving all of the visualized osseous structures. I do not see any obvious pathologic fractures or spinal canal compromise. CT ABDOMEN PELVIS FINDINGS Hepatobiliary: No focal hepatic lesions are identified. There is mild intra hepatic biliary dilatation and moderate extrahepatic biliary dilatation. The common bile duct in the porta hepatis measures up to 18 mm and also measures a maximum of 12.5 mm in the head of the pancreas. It does appear to taper normally to the ampulla. No obvious obstructing common bile duct stone. The gallbladder is unremarkable. Pancreas: The pancreatic head appears normal. Normal caliber main pancreatic duct in the pancreatic head. However, the pancreatic body and tail are significantly atrophied and there is fairly marked distention of the main pancreatic duct. Do not see an obvious obstructing mass at the head body junction region. Moderate-sized duodenal diverticulum noted near the pancreatic head. Spleen: Normal size. No focal lesions. Adrenals/Urinary Tract: A left adrenal gland lesion measures 2.5 x 1.5 cm and could be a metastatic focus. No worrisome renal lesions or hydronephrosis. The bladder appears normal. Stomach/Bowel: The stomach, duodenum, small bowel and colon are grossly normal. No acute inflammatory changes, mass lesions or obstructive findings. The terminal ileum and appendix are normal. Vascular/Lymphatic: The aorta and branch vessels are patent. The major venous structures are patent. Small scattered paraduodenal lymph nodes measuring a maximum of 9.5 mm. Small scattered left-sided retroperitoneal lymph nodes. The largest measures 10 mm on image 70/2. No pelvic adenopathy. Small scattered pelvic lymph nodes are noted. No inguinal adenopathy. Reproductive: The prostate gland and seminal vesicles are grossly normal. Other: No pelvic mass or adenopathy. No free pelvic fluid collections. No inguinal  mass or adenopathy. No abdominal wall hernia or subcutaneous lesions. Musculoskeletal: Extensive mixed lytic and sclerotic metastatic bone disease without evidence of pathologic fracture. IMPRESSION: 1. Diffuse mixed lytic and sclerotic metastatic bone disease without evidence of pathologic fracture or spinal canal compromise. Findings suspicious for metastatic prostate cancer. Recommend correlation with PSA level. 2. 2.5 x 1.5 cm left adrenal gland lesion suspicious for metastatic disease. 3. Intra and extrahepatic biliary dilatation without obvious cause. Recommend correlation with liver function studies. ERCP or MRCP may be helpful for further evaluation. 4. Atrophy of the pancreatic body and tail and distended main pancreatic duct. No obvious mass at the head neck junction region. Possible stricture. MRI may be helpful for further  evaluation. 5. Emphysematous changes and patchy ill-defined nodular densities in both lungs, unlikely metastatic disease. Recommend short-term follow-up noncontrast chest CT in 3 months. Aortic Atherosclerosis (ICD10-I70.0) and Emphysema (ICD10-J43.9). Electronically Signed   By: Marijo Sanes M.D.   On: 03/23/2019 14:43     Assessment and Plan:    74 year old man with:  1.  Advanced malignancy with bone involvement documented on a CT scan on March 23, 2019.  This was in the setting of an elevated PSA of 193 that is suspicious for metastatic prostate cancer.  The differential diagnosis as well as management options were reviewed today at this point prostate cancer remains the most likely diagnosis with tissue biopsy would be helpful.  This could be in the form of obtaining a prostate biopsy or CT-guided biopsy if any of his bone lesions.  I favor obtaining tissue biopsy for analysis and potentially next generation sequencing in the future to guide treatment.  The natural course of this disease and treatment options were reviewed.  Androgen deprivation therapy remains the  gold standard to treat of this disease at this time.  Complication associated with these hormone treatments including weight gain, hot flashes and sexual dysfunction.  Therapy escalation with androgen synthesis inhibitor androgen receptor blockade were also reviewed at this time and will be added at a later date.  After discussion today, he is agreeable to proceed with a CT-guided biopsy of any of his bone lesions or adrenal gland to confirm the diagnosis.  We will proceed with [name] in the immediate future and switch to Eligard at a later date.  2.  Androgen deprivation: I will set him up to start Firmagon in the next week and switch to Eligard at a later date.  3.  Bone directed therapy: He would be a candidate for Xgeva after obtaining dental clearance.  Recommended calcium and vitamin D supplements.  4.  Prognosis and goals of care: His disease is incurable and any therapy is palliative at this time.  Aggressive measures are warranted given his reasonable performance status.  5.  Anemia: Related to his malignancy and metastatic disease to the bone.  Anticipate improvement with treating his cancer.  6.  Follow-up: Will be in the next week to start [place] and in 4 weeks for repeat evaluation.   60  minutes were spent on this visit.  The time was dedicated to reviewing his laboratory data, imaging studies, differential diagnosis and management options.     A copy of this consult has been forwarded to the requesting physician.

## 2019-04-13 NOTE — Progress Notes (Signed)
Introduced myself to the patient and his significant other, Stephen Lynch as the prostate nurse navigator and discussed my role. He was recently hospitalized with pain and diagnosed with metastatic prostate cancer. We discussed androgen deprivation, how it works and side effects. Pt was given " Androgen Deprivation Therapy" patient guide. I gave them my contact information and how to contact Dr. Hazeline Junker nurse. I will continue to follow and asked them to call me with questions or concerns. They voiced understanding of the above.

## 2019-04-14 ENCOUNTER — Encounter (HOSPITAL_COMMUNITY): Payer: Self-pay | Admitting: Radiology

## 2019-04-14 ENCOUNTER — Telehealth: Payer: Self-pay | Admitting: Oncology

## 2019-04-14 NOTE — Telephone Encounter (Signed)
Scheduled appt per 3/2 los.  Spoke with [name] and she is aware of the appt date and time.

## 2019-04-14 NOTE — Progress Notes (Signed)
Stephen Lynch Male, 74 y.o., [birth year] MRN:  [id number] Phone:  [phone] (H) ... PCP:  Patient, No Pcp Per Primary Cvg:  Medicare/Medicare Part A And B Next Appt With [name], RN 04/15/2019 at 10:30 AM  RE: CT Biopsy Received: Today Message Contents  Jacqulynn Cadet, MD  Garth Bigness D  Approved for CT guided biopsy of iliac bone. Prostate ca with diffuse mets. Approach like a bone marrow, but this is a metastatic work-up.   HKM          Previous Messages   ----- Message -----  From: Garth Bigness D  Sent: 04/13/2019  3:27 PM EST  To: Ir Procedure Requests  Subject: CT Biopsy                     Procedure: CT Biopsy   Reason: Metastasis to spinal column, Prostate cancer, Cancer in the bone   History:  CT in computer   Provider: [name]   Provider Contact:  [phone]

## 2019-04-15 ENCOUNTER — Other Ambulatory Visit: Payer: Self-pay | Admitting: *Deleted

## 2019-04-15 NOTE — Patient Outreach (Signed)
[facility]) Care Management  04/15/2019  Gia Hemp 04-14-1945 [id number]   Call placed to member to follow up on ongoing recover from hospital admission and to complete initial assessment, no answer.  Unable to leave message as mailbox was full.  Call then placed to significant other [name], no answer, HIPAA compliant voice message left.  Will follow up within the next 3-4 business days.  [name], [place], MSN [place] [phone]

## 2019-04-20 ENCOUNTER — Other Ambulatory Visit: Payer: Self-pay | Admitting: *Deleted

## 2019-04-20 ENCOUNTER — Other Ambulatory Visit: Payer: Self-pay

## 2019-04-20 ENCOUNTER — Inpatient Hospital Stay: Payer: Medicare Other

## 2019-04-20 ENCOUNTER — Other Ambulatory Visit: Payer: Self-pay | Admitting: Radiology

## 2019-04-20 VITALS — BP 141/75 | HR 87 | Temp 98.2°F | Resp 20

## 2019-04-20 DIAGNOSIS — Z5111 Encounter for antineoplastic chemotherapy: Secondary | ICD-10-CM | POA: Diagnosis not present

## 2019-04-20 DIAGNOSIS — C61 Malignant neoplasm of prostate: Secondary | ICD-10-CM

## 2019-04-20 DIAGNOSIS — C7951 Secondary malignant neoplasm of bone: Secondary | ICD-10-CM | POA: Diagnosis not present

## 2019-04-20 MED ORDER — DEGARELIX ACETATE(240 MG DOSE) 120 MG/VIAL ~~LOC~~ SOLR
240.0000 mg | Freq: Once | SUBCUTANEOUS | Status: AC
  Administered 2019-04-20: 240 mg via SUBCUTANEOUS
  Filled 2019-04-20: qty 6

## 2019-04-20 NOTE — Patient Instructions (Signed)
Degarelix injection What is this medicine? DEGARELIX (deg a REL ix) is used to treat men with advanced prostate cancer. This medicine may be used for other purposes; ask your health care provider or pharmacist if you have questions. COMMON BRAND NAME(S): Degarelix, Firmagon What should I tell my health care provider before I take this medicine? They need to know if you have any of these conditions:  diabetes  heart disease  kidney disease  liver disease  low levels of potassium or magnesium in the blood  osteoporosis  an unusual or allergic reaction to degarelix, mannitol, other medicines, foods, dyes, or preservatives  pregnant or trying to get pregnant  breast-feeding How should I use this medicine? This medicine is for injection under the skin. It is usually given by a health care professional in a hospital or clinic setting. If you get this medicine at home, you will be taught how to prepare and give this medicine. Use exactly as directed. Take your medicine at regular intervals. Do not take it more often than directed. It is important that you put your used needles and syringes in a special sharps container. Do not put them in a trash can. If you do not have a sharps container, call your pharmacist or healthcare provider to get one. Talk to your pediatrician regarding the use of this medicine in children. Special care may be needed. Overdosage: If you think you have taken too much of this medicine contact a poison control center or emergency room at once. NOTE: This medicine is only for you. Do not share this medicine with others. What if I miss a dose? Try not to miss a dose. If you do miss a dose, call your doctor or health care professional for advice. What may interact with this medicine? Do not take this medicine with any of the following medications:  amiodarone  bretylium  disopyramide  droperidol  ibutilide  procainamide  quinidine  sotalol This  medicine may also interact with the following medications:  dofetilide This list may not describe all possible interactions. Give your health care provider a list of all the medicines, herbs, non-prescription drugs, or dietary supplements you use. Also tell them if you smoke, drink alcohol, or use illegal drugs. Some items may interact with your medicine. What should I watch for while using this medicine? Visit your doctor or health care professional for regular checks on your progress and discuss any issues before you start taking this medicine. Do not rub or scratch injection site. There may be a lump at the injection site, or it may be red or sore for a few days after your dose. Your doctor or health care professional will need to monitor your hormone levels in your blood to check your response to treatment. Try to keep any appointments for testing. What side effects may I notice from receiving this medicine? Side effects that you should report to your doctor or health care professional as soon as possible:  allergic reactions like skin rash, itching or hives, swelling of the face, lips, or tongue  fever or chills  irregular heartbeat  nausea and vomiting along with severe abdominal pain  pain or difficulty passing urine  pelvic pain or bloating  signs and symptoms of high blood sugar such as being more thirsty or hungry or having to urinate more than normal. You may also feel very tired or have blurry vision Side effects that usually do not require medical attention (report to your doctor or health   care professional if they continue or are bothersome):  change in sex drive or performance  constipation  headache  high blood pressure  hot flashes (flushing of skin, increased sweating)  itching, redness or mild pain at site where injected  joint pain  trouble sleeping  unusually weak or tired  weight gain This list may not describe all possible side effects. Call your  doctor for medical advice about side effects. You may report side effects to FDA at 1-800-FDA-1088. Where should I keep my medicine? Keep out of the reach of children. This drug is usually given in a hospital or clinic and will not be stored at home. In rare cases, this medicine may be given at home. If you are using this medicine at home, you will be instructed on how to store this medicine. Throw away any unused medicine after the expiration date on the label. NOTE: This sheet is a summary. It may not cover all possible information. If you have questions about this medicine, talk to your doctor, pharmacist, or health care provider.  2020 Elsevier/Gold Standard (2018-04-13 12:20:52)  

## 2019-04-20 NOTE — Patient Outreach (Signed)
[facility]) Care Management  04/20/2019  Zale Boeke 16-Jan-1946 [id number]   Outreach attempt #2, successful however member report this is not a good time to talk as he is preparing for a MD visit.  Request call back, will follow up within the next 3-4 business days.  [name], [place], MSN [place] [phone]

## 2019-04-21 ENCOUNTER — Other Ambulatory Visit: Payer: Self-pay | Admitting: *Deleted

## 2019-04-21 NOTE — Patient Outreach (Signed)
Stephen Lynch [facility]) Care Management  04/21/2019  Stephen Lynch 1945-11-29 [id number]   Call placed to member to follow up on plan of care, no answer.  Unable to leave voice message as mailbox is full.  Call then placed to his significant other Stephen Lynch.  She report she is on her way out, unable to talk.  Request call back when she is available, if no call back will follow up within the next week.  Stephen Lynch, [place], MSN [place] [phone]

## 2019-04-22 ENCOUNTER — Encounter (HOSPITAL_COMMUNITY): Payer: Self-pay

## 2019-04-22 ENCOUNTER — Other Ambulatory Visit: Payer: Self-pay

## 2019-04-22 ENCOUNTER — Ambulatory Visit (HOSPITAL_COMMUNITY)
Admission: RE | Admit: 2019-04-22 | Discharge: 2019-04-22 | Disposition: A | Discharge status: Home / Self Care | Payer: Medicare Other | Source: Ambulatory Visit | Attending: Oncology | Admitting: Oncology

## 2019-04-22 DIAGNOSIS — Z8249 Family history of ischemic heart disease and other diseases of the circulatory system: Secondary | ICD-10-CM | POA: Diagnosis not present

## 2019-04-22 DIAGNOSIS — C61 Malignant neoplasm of prostate: Secondary | ICD-10-CM

## 2019-04-22 DIAGNOSIS — Z794 Long term (current) use of insulin: Secondary | ICD-10-CM | POA: Diagnosis not present

## 2019-04-22 DIAGNOSIS — Z8546 Personal history of malignant neoplasm of prostate: Secondary | ICD-10-CM | POA: Insufficient documentation

## 2019-04-22 DIAGNOSIS — Z87891 Personal history of nicotine dependence: Secondary | ICD-10-CM | POA: Diagnosis not present

## 2019-04-22 DIAGNOSIS — E785 Hyperlipidemia, unspecified: Secondary | ICD-10-CM | POA: Diagnosis not present

## 2019-04-22 DIAGNOSIS — E119 Type 2 diabetes mellitus without complications: Secondary | ICD-10-CM | POA: Insufficient documentation

## 2019-04-22 DIAGNOSIS — M898X8 Other specified disorders of bone, other site: Secondary | ICD-10-CM | POA: Diagnosis not present

## 2019-04-22 DIAGNOSIS — M899 Disorder of bone, unspecified: Secondary | ICD-10-CM | POA: Diagnosis present

## 2019-04-22 DIAGNOSIS — I10 Essential (primary) hypertension: Secondary | ICD-10-CM | POA: Insufficient documentation

## 2019-04-22 DIAGNOSIS — Z79899 Other long term (current) drug therapy: Secondary | ICD-10-CM | POA: Insufficient documentation

## 2019-04-22 DIAGNOSIS — C7951 Secondary malignant neoplasm of bone: Secondary | ICD-10-CM | POA: Insufficient documentation

## 2019-04-22 DIAGNOSIS — Z7982 Long term (current) use of aspirin: Secondary | ICD-10-CM | POA: Diagnosis not present

## 2019-04-22 DIAGNOSIS — C801 Malignant (primary) neoplasm, unspecified: Secondary | ICD-10-CM | POA: Diagnosis not present

## 2019-04-22 DIAGNOSIS — D492 Neoplasm of unspecified behavior of bone, soft tissue, and skin: Secondary | ICD-10-CM | POA: Diagnosis not present

## 2019-04-22 LAB — PROTIME-INR
INR: 1.2 (ref 0.8–1.2)
Prothrombin Time: 15 seconds (ref 11.4–15.2)

## 2019-04-22 LAB — CBC WITH DIFFERENTIAL/PLATELET
Abs Immature Granulocytes: 0.25 10*3/uL — ABNORMAL HIGH (ref 0.00–0.07)
Basophils Absolute: 0 10*3/uL (ref 0.0–0.1)
Basophils Relative: 0 %
Eosinophils Absolute: 0 10*3/uL (ref 0.0–0.5)
Eosinophils Relative: 0 %
HCT: 30.6 % — ABNORMAL LOW (ref 39.0–52.0)
Hemoglobin: 9 g/dL — ABNORMAL LOW (ref 13.0–17.0)
Immature Granulocytes: 4 %
Lymphocytes Relative: 27 %
Lymphs Abs: 1.7 10*3/uL (ref 0.7–4.0)
MCH: 28 pg (ref 26.0–34.0)
MCHC: 29.4 g/dL — ABNORMAL LOW (ref 30.0–36.0)
MCV: 95 fL (ref 80.0–100.0)
Monocytes Absolute: 0.6 10*3/uL (ref 0.1–1.0)
Monocytes Relative: 10 %
Neutro Abs: 3.7 10*3/uL (ref 1.7–7.7)
Neutrophils Relative %: 59 %
Platelets: 465 10*3/uL — ABNORMAL HIGH (ref 150–400)
RBC: 3.22 MIL/uL — ABNORMAL LOW (ref 4.22–5.81)
RDW: 18.6 % — ABNORMAL HIGH (ref 11.5–15.5)
WBC: 6.2 10*3/uL (ref 4.0–10.5)
nRBC: 2.2 % — ABNORMAL HIGH (ref 0.0–0.2)

## 2019-04-22 LAB — GLUCOSE, CAPILLARY: Glucose-Capillary: 156 mg/dL — ABNORMAL HIGH (ref 70–99)

## 2019-04-22 MED ORDER — FENTANYL CITRATE (PF) 100 MCG/2ML IJ SOLN
INTRAMUSCULAR | Status: AC | PRN
  Administered 2019-04-22 (×2): 50 ug via INTRAVENOUS

## 2019-04-22 MED ORDER — MIDAZOLAM HCL 2 MG/2ML IJ SOLN
INTRAMUSCULAR | Status: AC
  Filled 2019-04-22: qty 4

## 2019-04-22 MED ORDER — LIDOCAINE HCL (PF) 1 % IJ SOLN
INTRAMUSCULAR | Status: AC | PRN
  Administered 2019-04-22: 10 mL via INTRADERMAL

## 2019-04-22 MED ORDER — NALOXONE HCL 0.4 MG/ML IJ SOLN
INTRAMUSCULAR | Status: AC
  Filled 2019-04-22: qty 1

## 2019-04-22 MED ORDER — SODIUM CHLORIDE 0.9 % IV SOLN: INTRAVENOUS | Status: DC

## 2019-04-22 MED ORDER — FENTANYL CITRATE (PF) 100 MCG/2ML IJ SOLN
INTRAMUSCULAR | Status: AC
  Filled 2019-04-22: qty 2

## 2019-04-22 MED ORDER — FLUMAZENIL 0.5 MG/5ML IV SOLN
INTRAVENOUS | Status: AC
  Filled 2019-04-22: qty 5

## 2019-04-22 MED ORDER — MIDAZOLAM HCL 2 MG/2ML IJ SOLN
INTRAMUSCULAR | Status: AC | PRN
  Administered 2019-04-22 (×3): 1 mg via INTRAVENOUS

## 2019-04-22 NOTE — H&P (Signed)
Chief Complaint: Patient was seen in consultation today for iliac bone lytic lesion/biopsy.  Referring Physician(s): [name]  Supervising Physician: Aletta Edouard  Patient Status: [facility] - Out-pt  History of Present Illness: Stephen Lynch is a 74 y.o. male with a past medical history of hypertension, dyslipidemia, and diabetes mellitus. He was recently admitted to [facility] 03/23/2019 to 03/25/2019 for management of back pain. During work-up, CT abdomen/pelvis revealed diffuse lytic/sclerotic metastatic bone disease suspicious for metastatic prostate cancer. He was discharged home with outpatient oncology follow-up. He met with Dr. Alen Blew 04/13/2019 who recommended IR biopsy for tissue diagnosis.  CT abdomen/pelvis 03/23/2019: 1. Diffuse mixed lytic and sclerotic metastatic bone disease without evidence of pathologic fracture or spinal canal compromise. Findings suspicious for metastatic prostate cancer. Recommend correlation with PSA level. 2. 2.5 x 1.5 cm left adrenal gland lesion suspicious for metastatic disease. 3. Intra and extrahepatic biliary dilatation without obvious cause. Recommend correlation with liver function studies. ERCP or MRCP may be helpful for further evaluation. 4. Atrophy of the pancreatic body and tail and distended main pancreatic duct. No obvious mass at the head neck junction region. Possible stricture. MRI may be helpful for further evaluation. 5. Emphysematous changes and patchy ill-defined nodular densities in both lungs, unlikely metastatic disease. Recommend short-term follow-up noncontrast chest CT in 3 months.  IR requested by Dr. Alen Blew for possible image-guided iliac bone biopsy. Patient awake and alert laying in bed with no complaints at this time. Denies fever, chills, chest pain, dyspnea, or abdominal pain.   Past Medical History:  Diagnosis Date  . Diabetes mellitus without complication ([place])   . Dyslipidemia   . Hypertension     Past Surgical  History:  Procedure Laterality Date  . EYE SURGERY      Allergies: Patient has no known allergies.  Medications: Prior to Admission medications   Medication Sig Start Date End Date Taking? Authorizing Provider  acetaminophen (TYLENOL) 500 MG tablet Take 500 mg by mouth every 6 (six) hours as needed for mild pain.    [provider]  ASPIRIN 81 PO Take 81 mg by mouth daily.     [provider]  Calcium-Magnesium-Zinc [phone] MG TABS Take 1 tablet by mouth daily.    [provider]  doxycycline (VIBRA-TABS) 100 MG tablet  04/12/19   [provider]  DULoxetine (CYMBALTA) 60 MG capsule Take 60 mg by mouth daily. 03/20/19   [provider]  ibuprofen (ADVIL) 200 MG tablet Take 200 mg by mouth every 6 (six) hours as needed for moderate pain.    [provider]  insulin lispro (HUMALOG) 100 UNIT/ML KwikPen Junior Inject 0.2 mLs (20 Units total) into the skin 3 (three) times daily after meals. Patient taking differently: Inject 7-25 Units into the skin 3 (three) times daily after meals. Per sliding scale 09/25/16   [name], MD  lidocaine (LIDODERM) 5 % Place 2 patches onto the skin daily. Remove & Discard patch within 12 hours or as directed by MD 03/26/19   Pokhrel, Corrie Mckusick, MD  Menthol-Camphor (TIGER BALM ARTHRITIS RUB EX) Apply 1 application topically 2 (two) times daily as needed (back pain).    [provider]  ondansetron (ZOFRAN) 4 MG tablet Take 1 tablet (4 mg total) by mouth every 6 (six) hours as needed for nausea. 03/25/19   Pokhrel, Corrie Mckusick, MD  oxyCODONE-acetaminophen (PERCOCET) 7.5-325 MG tablet Take 1 tablet by mouth 4 (four) times daily as needed. 04/09/19   [provider]  rosuvastatin (  CRESTOR) 10 MG tablet Take 1 tablet (10 mg total) by mouth daily. 09/25/16   [name], MD  TRESIBA FLEXTOUCH 100 UNIT/ML SOPN FlexTouch Pen Inject 10 Units into the skin at bedtime.  02/18/19   [provider]    ULTRAM 50 MG tablet Take 50 mg by mouth every 6 (six) hours as needed for pain. 02/26/19   [provider]     Family History  Problem Relation Age of Onset  . CAD Mother 46  . Other Father 56       age  . Other Brother 49       unknown cause    Social History   Socioeconomic History  . Marital status: Legally Separated    Spouse name: Not on file  . Number of children: Not on file  . Years of education: Not on file  . Highest education level: Not on file  Occupational History  . Occupation: retired Air cabin crew at the [employer]  . Smoking status: Former Smoker    Packs/day: 0.50    Years: 15.00    Pack years: 7.50  . Smokeless tobacco: Never Used  Substance and Sexual Activity  . Alcohol use: Not Currently    Comment: h/o heavy use, quit about 2010  . Drug use: Not Currently    Comment: h/o use with cocaine, marijuana, quit about 2010  . Sexual activity: Not on file  Other Topics Concern  . Not on file  Social History Narrative  . Not on file   Social Determinants of Health   Financial Resource Strain:   . Difficulty of Paying Living Expenses:   Food Insecurity: No Food Insecurity  . Worried About Charity fundraiser in the Last Year: Never true  . Ran Out of Food in the Last Year: Never true  Transportation Needs: No Transportation Needs  . Lack of Transportation (Medical): No  . Lack of Transportation (Non-Medical): No  Physical Activity:   . Days of Exercise per Week:   . Minutes of Exercise per Session:   Stress:   . Feeling of Stress :   Social Connections:   . Frequency of Communication with Friends and Family:   . Frequency of Social Gatherings with Friends and Family:   . Attends Religious Services:   . Active Member of Clubs or Organizations:   . Attends Archivist Meetings:   Marland Kitchen Marital Status:      Review of Systems: A 12 point ROS discussed and pertinent positives are indicated in the HPI above.  All other  systems are negative.  Review of Systems  Constitutional: Negative for chills and fever.  Respiratory: Negative for shortness of breath and wheezing.   Cardiovascular: Negative for chest pain and palpitations.  Gastrointestinal: Negative for abdominal pain.  Neurological: Negative for headaches.  Psychiatric/Behavioral: Negative for behavioral problems and confusion.    Vital Signs: There were no vitals taken for this visit.  Physical Exam Vitals and nursing note reviewed.  Constitutional:      General: He is not in acute distress.    Appearance: Normal appearance.  Cardiovascular:     Rate and Rhythm: Normal rate and regular rhythm.     Heart sounds: Normal heart sounds. No murmur.  Pulmonary:     Effort: Pulmonary effort is normal. No respiratory distress.     Breath sounds: Normal breath sounds. No wheezing.  Skin:    General: Skin is warm and dry.  Neurological:  Mental Status: He is alert and oriented to person, place, and time.  Psychiatric:        Mood and Affect: Mood normal.        Behavior: Behavior normal.      MD Evaluation Airway: WNL Heart: WNL Abdomen: WNL Chest/ Lungs: WNL ASA  Classification: 3 Mallampati/Airway Score: One   Imaging: DG Cervical Spine Complete  Result Date: 03/23/2019 CLINICAL DATA:  Neck pain, pain in upper back and bilateral flank pain for 10 months EXAM: CERVICAL SPINE - COMPLETE 4+ VIEW COMPARISON:  Thoracic spine evaluation of the same date. FINDINGS: Prevertebral soft tissues are normal. Spinal degenerative changes are present, worse in the lower cervical spine, greatest at C7-T1. Alignment is preserved showing mild lordotic curvature, normal appearance. Neural foraminal narrowing due to facet hypertrophy and uncovertebral degenerative changes greatest at C5-6 and C6-7 on the right, also present on the left. IMPRESSION: Spinal degenerative changes without signs of acute abnormality. Electronically Signed   By: Zetta Bills  M.D.   On: 03/23/2019 11:13   DG Thoracic Spine 2 View  Result Date: 03/23/2019 CLINICAL DATA:  Back pain for 10 months. EXAM: THORACIC SPINE 2 VIEWS COMPARISON:  None. FINDINGS: There is patchy sclerosis throughout the thoracic spine with slight sclerosis of the lateral aspect of the right second rib. The findings are concerning for metastatic prostate cancer. No pathologic fracture or disc space narrowing. Aortic atherosclerosis. IMPRESSION: 1. Findings concerning for metastatic disease to the thoracic spine. The possibility of prostate cancer should be considered. 2.  Aortic Atherosclerosis (ICD10-I70.0). 3. No fractures. Electronically Signed   By: Lorriane Shire M.D.   On: 03/23/2019 11:15   DG Lumbar Spine Complete  Result Date: 03/23/2019 CLINICAL DATA:  Back pain for 10 months. EXAM: LUMBAR SPINE - COMPLETE 4+ VIEW COMPARISON:  None. FINDINGS: There is patchy sclerosis throughout the visualized portion of the spine with patchy areas of sclerosis in the pelvic bones. There is a slight anterior wedge deformity of the superior endplate of L1 which could represent a pathologic fracture. There is disc space narrowing at L5-S1. The other disc spaces are preserved. Lateral alignment is normal. Slight degenerative facet arthritis in the lower lumbar spine. IMPRESSION: 1. Possible pathologic fracture of the superior endplate of L1. 2. Diffuse sclerotic metastatic disease in the visualized portion of the spine and pelvis. 3. Degenerative disc  disease at L5-S1. Electronically Signed   By: Lorriane Shire M.D.   On: 03/23/2019 11:17   CT Chest W Contrast  Result Date: 03/23/2019 CLINICAL DATA:  Sclerotic bone disease and pathologic fractures seen on radiographs. EXAM: CT CHEST, ABDOMEN, AND PELVIS WITH CONTRAST TECHNIQUE: Multidetector CT imaging of the chest, abdomen and pelvis was performed following the standard protocol during bolus administration of intravenous contrast. CONTRAST:  131m OMNIPAQUE IOHEXOL  300 MG/ML  SOLN COMPARISON:  Radiographs, same date. FINDINGS: CT CHEST FINDINGS Cardiovascular: The heart is normal in size. No pericardial effusion. The aorta is normal in caliber. No dissection. The branch vessels are patent. Scattered atherosclerotic calcifications. Scattered coronary artery calcifications. Mediastinum/Nodes: No mediastinal or hilar mass or adenopathy. The esophagus is grossly normal. The thyroid gland is enlarged and there are multiple thyroid nodules. The largest nodule measures 13.5 mm. Substernal thyroid tissue noted on the right side adjacent to the esophagus. Lungs/Pleura: Emphysematous changes are noted. Patchy ill-defined nodular densities and both lungs possibly areas of inflammation or infection. Metastatic disease seems unlikely. Short-term follow-up chest CT in 3 months is suggested. No  pleural effusion or pleural nodules. Musculoskeletal: Musculoskeletal diffuse mixed lytic and sclerotic metastatic bone disease involving all of the visualized osseous structures. I do not see any obvious pathologic fractures or spinal canal compromise. CT ABDOMEN PELVIS FINDINGS Hepatobiliary: No focal hepatic lesions are identified. There is mild intra hepatic biliary dilatation and moderate extrahepatic biliary dilatation. The common bile duct in the porta hepatis measures up to 18 mm and also measures a maximum of 12.5 mm in the head of the pancreas. It does appear to taper normally to the ampulla. No obvious obstructing common bile duct stone. The gallbladder is unremarkable. Pancreas: The pancreatic head appears normal. Normal caliber main pancreatic duct in the pancreatic head. However, the pancreatic body and tail are significantly atrophied and there is fairly marked distention of the main pancreatic duct. Do not see an obvious obstructing mass at the head body junction region. Moderate-sized duodenal diverticulum noted near the pancreatic head. Spleen: Normal size. No focal lesions.  Adrenals/Urinary Tract: A left adrenal gland lesion measures 2.5 x 1.5 cm and could be a metastatic focus. No worrisome renal lesions or hydronephrosis. The bladder appears normal. Stomach/Bowel: The stomach, duodenum, small bowel and colon are grossly normal. No acute inflammatory changes, mass lesions or obstructive findings. The terminal ileum and appendix are normal. Vascular/Lymphatic: The aorta and branch vessels are patent. The major venous structures are patent. Small scattered paraduodenal lymph nodes measuring a maximum of 9.5 mm. Small scattered left-sided retroperitoneal lymph nodes. The largest measures 10 mm on image 70/2. No pelvic adenopathy. Small scattered pelvic lymph nodes are noted. No inguinal adenopathy. Reproductive: The prostate gland and seminal vesicles are grossly normal. Other: No pelvic mass or adenopathy. No free pelvic fluid collections. No inguinal mass or adenopathy. No abdominal wall hernia or subcutaneous lesions. Musculoskeletal: Extensive mixed lytic and sclerotic metastatic bone disease without evidence of pathologic fracture. IMPRESSION: 1. Diffuse mixed lytic and sclerotic metastatic bone disease without evidence of pathologic fracture or spinal canal compromise. Findings suspicious for metastatic prostate cancer. Recommend correlation with PSA level. 2. 2.5 x 1.5 cm left adrenal gland lesion suspicious for metastatic disease. 3. Intra and extrahepatic biliary dilatation without obvious cause. Recommend correlation with liver function studies. ERCP or MRCP may be helpful for further evaluation. 4. Atrophy of the pancreatic body and tail and distended main pancreatic duct. No obvious mass at the head neck junction region. Possible stricture. MRI may be helpful for further evaluation. 5. Emphysematous changes and patchy ill-defined nodular densities in both lungs, unlikely metastatic disease. Recommend short-term follow-up noncontrast chest CT in 3 months. Aortic  Atherosclerosis (ICD10-I70.0) and Emphysema (ICD10-J43.9). Electronically Signed   By: Marijo Sanes M.D.   On: 03/23/2019 14:43   CT ABDOMEN PELVIS W CONTRAST  Result Date: 03/23/2019 CLINICAL DATA:  Sclerotic bone disease and pathologic fractures seen on radiographs. EXAM: CT CHEST, ABDOMEN, AND PELVIS WITH CONTRAST TECHNIQUE: Multidetector CT imaging of the chest, abdomen and pelvis was performed following the standard protocol during bolus administration of intravenous contrast. CONTRAST:  180m OMNIPAQUE IOHEXOL 300 MG/ML  SOLN COMPARISON:  Radiographs, same date. FINDINGS: CT CHEST FINDINGS Cardiovascular: The heart is normal in size. No pericardial effusion. The aorta is normal in caliber. No dissection. The branch vessels are patent. Scattered atherosclerotic calcifications. Scattered coronary artery calcifications. Mediastinum/Nodes: No mediastinal or hilar mass or adenopathy. The esophagus is grossly normal. The thyroid gland is enlarged and there are multiple thyroid nodules. The largest nodule measures 13.5 mm. Substernal thyroid tissue noted on the  right side adjacent to the esophagus. Lungs/Pleura: Emphysematous changes are noted. Patchy ill-defined nodular densities and both lungs possibly areas of inflammation or infection. Metastatic disease seems unlikely. Short-term follow-up chest CT in 3 months is suggested. No pleural effusion or pleural nodules. Musculoskeletal: Musculoskeletal diffuse mixed lytic and sclerotic metastatic bone disease involving all of the visualized osseous structures. I do not see any obvious pathologic fractures or spinal canal compromise. CT ABDOMEN PELVIS FINDINGS Hepatobiliary: No focal hepatic lesions are identified. There is mild intra hepatic biliary dilatation and moderate extrahepatic biliary dilatation. The common bile duct in the porta hepatis measures up to 18 mm and also measures a maximum of 12.5 mm in the head of the pancreas. It does appear to taper  normally to the ampulla. No obvious obstructing common bile duct stone. The gallbladder is unremarkable. Pancreas: The pancreatic head appears normal. Normal caliber main pancreatic duct in the pancreatic head. However, the pancreatic body and tail are significantly atrophied and there is fairly marked distention of the main pancreatic duct. Do not see an obvious obstructing mass at the head body junction region. Moderate-sized duodenal diverticulum noted near the pancreatic head. Spleen: Normal size. No focal lesions. Adrenals/Urinary Tract: A left adrenal gland lesion measures 2.5 x 1.5 cm and could be a metastatic focus. No worrisome renal lesions or hydronephrosis. The bladder appears normal. Stomach/Bowel: The stomach, duodenum, small bowel and colon are grossly normal. No acute inflammatory changes, mass lesions or obstructive findings. The terminal ileum and appendix are normal. Vascular/Lymphatic: The aorta and branch vessels are patent. The major venous structures are patent. Small scattered paraduodenal lymph nodes measuring a maximum of 9.5 mm. Small scattered left-sided retroperitoneal lymph nodes. The largest measures 10 mm on image 70/2. No pelvic adenopathy. Small scattered pelvic lymph nodes are noted. No inguinal adenopathy. Reproductive: The prostate gland and seminal vesicles are grossly normal. Other: No pelvic mass or adenopathy. No free pelvic fluid collections. No inguinal mass or adenopathy. No abdominal wall hernia or subcutaneous lesions. Musculoskeletal: Extensive mixed lytic and sclerotic metastatic bone disease without evidence of pathologic fracture. IMPRESSION: 1. Diffuse mixed lytic and sclerotic metastatic bone disease without evidence of pathologic fracture or spinal canal compromise. Findings suspicious for metastatic prostate cancer. Recommend correlation with PSA level. 2. 2.5 x 1.5 cm left adrenal gland lesion suspicious for metastatic disease. 3. Intra and extrahepatic biliary  dilatation without obvious cause. Recommend correlation with liver function studies. ERCP or MRCP may be helpful for further evaluation. 4. Atrophy of the pancreatic body and tail and distended main pancreatic duct. No obvious mass at the head neck junction region. Possible stricture. MRI may be helpful for further evaluation. 5. Emphysematous changes and patchy ill-defined nodular densities in both lungs, unlikely metastatic disease. Recommend short-term follow-up noncontrast chest CT in 3 months. Aortic Atherosclerosis (ICD10-I70.0) and Emphysema (ICD10-J43.9). Electronically Signed   By: Marijo Sanes M.D.   On: 03/23/2019 14:43    Labs:  CBC: Recent Labs    03/23/19 1021 03/24/19 1753 03/25/19 0233 03/25/19 1422  WBC 8.5 6.1 4.8 4.7  HGB 6.1* 7.1* 7.5* 9.1*  HCT 20.8* 24.4* 25.9* 30.3*  PLT 353 335 325 333    COAGS: No results for input(s): INR, APTT in the last 8760 hours.  BMP: Recent Labs    03/23/19 1021 03/25/19 0233  NA 132* 134*  K 4.1 4.2  CL 93* 100  CO2 25 25  GLUCOSE 358* 256*  BUN 16 24*  CALCIUM 8.5* 8.2*  CREATININE  0.88 0.73  GFRNONAA >60 >60  GFRAA >60 >60    LIVER FUNCTION TESTS: Recent Labs    03/23/19 1021 03/25/19 0233  BILITOT 0.7 0.7  AST 13* 25  ALT 22 25  ALKPHOS 375* 273*  PROT 6.8 6.7  ALBUMIN 2.2* 2.3*     Assessment and Plan:  Iliac bone lytic lesions concerning for metastatic prostate cancer. Plan for image-guided iliac bone biopsy today in IR. Patient is NPO. Afebrile. He does not take blood thinners.  Risks and benefits discussed with the patient including, but not limited to bleeding, infection, damage to adjacent structures or low yield requiring additional tests. All of the patient's questions were answered, patient is agreeable to proceed. Consent signed and in chart.   Thank you for this interesting consult.  I greatly enjoyed meeting Lanard Arguijo and look forward to participating in their care.  A copy of this  report was sent to the requesting provider on this date.  Electronically Signed: Earley Abide, PA-C 04/22/2019, 9:43 AM   I spent a total of 30 Minutes in face to face in clinical consultation, greater than 50% of which was counseling/coordinating care for iliac bone lytic lesion/biopsy.

## 2019-04-22 NOTE — Discharge Instructions (Signed)
Needle Biopsy, Care After These instructions tell you how to care for yourself after your procedure. Your doctor may also give you more specific instructions. Call your doctor if you have any problems or questions. What can I expect after the procedure? After the procedure, it is common to have:  Soreness.  Bruising.  Mild pain. Follow these instructions at home:   Return to your normal activities as told by your doctor. Ask your doctor what activities are safe for you.  Take over-the-counter and prescription medicines only as told by your doctor.  Wash your hands with soap and water before you change your bandage (dressing). If you cannot use soap and water, use hand sanitizer.  Follow instructions from your doctor about: ? How to take care of your puncture site. ? When and how to change your bandage. ? When to remove your bandage.  Check your puncture site every day for signs of infection. Watch for: ? Redness, swelling, or pain. ? Fluid or blood. ? Pus or a bad smell. ? Warmth.  Do not take baths, swim, or use a hot tub until your doctor approves. You may remove dressing 1 PM tomorrow and shower.  Keep all follow-up visits as told by your doctor. This is important. Contact a doctor if you have:  A fever.  Redness, swelling, or pain at the puncture site, and it lasts longer than a few days.  Fluid, blood, or pus coming from the puncture site.  Warmth coming from the puncture site. Get help right away if:  You have a lot of bleeding from the puncture site. Summary  After the procedure, it is common to have soreness, bruising, or mild pain at the puncture site.  Check your puncture site every day for signs of infection, such as redness, swelling, or pain.  Get help right away if you have severe bleeding from your puncture site. This information is not intended to replace advice given to you by your health care provider. Make sure you discuss any questions you have  with your health care provider. Document Revised: 02/10/2017 Document Reviewed: 02/10/2017 Elsevier Patient Education  [place].      Moderate Conscious Sedation, Adult, Care After These instructions provide you with information about caring for yourself after your procedure. Your health care provider may also give you more specific instructions. Your treatment has been planned according to current medical practices, but problems sometimes occur. Call your health care provider if you have any problems or questions after your procedure. What can I expect after the procedure? After your procedure, it is common:  To feel sleepy for several hours.  To feel clumsy and have poor balance for several hours.  To have poor judgment for several hours.  To vomit if you eat too soon. Follow these instructions at home: For at least 24 hours after the procedure:   Do not: ? Participate in activities where you could fall or become injured. ? Drive. ? Use heavy machinery. ? Drink alcohol. ? Take sleeping pills or medicines that cause drowsiness. ? Make important decisions or sign legal documents. ? Take care of children on your own.  Rest. Eating and drinking  Follow the diet recommended by your health care provider.  If you vomit: ? Drink water, juice, or soup when you can drink without vomiting. ? Make sure you have little or no nausea before eating solid foods. General instructions  Have a responsible adult stay with you until you are awake and  alert.  Take over-the-counter and prescription medicines only as told by your health care provider.  If you smoke, do not smoke without supervision.  Keep all follow-up visits as told by your health care provider. This is important. Contact a health care provider if:  You keep feeling nauseous or you keep vomiting.  You feel light-headed.  You develop a rash.  You have a fever. Get help right away if:  You have trouble  breathing. This information is not intended to replace advice given to you by your health care provider. Make sure you discuss any questions you have with your health care provider. Document Revised: 01/10/2017 Document Reviewed: 05/20/2015 Elsevier Patient Education  2020 [employer].

## 2019-04-22 NOTE — Procedures (Signed)
Interventional Radiology Procedure Note  Procedure: CT Guided Biopsy of right iliac bone lesions  Complications: None  Estimated Blood Loss: < 10 mL  Findings: 11 G core biopsy of right iliac bone performed under CT guidance.  Single core sample obtained and sent to Pathology.  Venetia Night. Kathlene Cote, M.D Pager:  [phone]

## 2019-04-26 ENCOUNTER — Other Ambulatory Visit: Payer: Self-pay | Admitting: *Deleted

## 2019-04-26 LAB — SURGICAL PATHOLOGY

## 2019-04-26 NOTE — Patient Outreach (Signed)
[place] [facility]) Care Management  04/26/2019  Json Gersh 15-Apr-1945 [id number]   Outreach attempt #3, unsuccessful.    Call placed to member to follow up on status and complete initial assessment, no answer.  HIPAA compliant voice message left, unsuccessful outreach letter sent.  Will follow up within the next 3-4 business days.  [name], [place], MSN [place] [phone]

## 2019-04-29 ENCOUNTER — Other Ambulatory Visit: Payer: Self-pay | Admitting: *Deleted

## 2019-04-29 NOTE — Patient Outreach (Signed)
[facility]) Care Management  04/29/2019  Stephen Lynch October 25, 1945 [id number]   Outreach attempt #4, successful.    Call placed to member to follow up on current status, state he has been doing "wonderful" with his recovery from Covid, the hospital admission, and biopsy.  He has a follow up appointment for labs and for office visit on 4/2, girlfriend will provide transportation.  He has still been having intermittent pain, relieved with alternating Ultram and Advil.  Report his appetite has increased, girlfriend still providing meals correlating to recommended diet.  State his blood sugars range 140's-150's, still working on trying to decrease the numbers.  This care manager inquired about yearly diabetic eye exam, state he it is time for one now.  Declined offer to have this care manager make call, state he will do so himself.  Report he has appointment with PCP on 4/9.  Denies any urgent concerns at this time, will follow up within the next month.  THN CM Care Plan Problem One     Most Recent Value  Care Plan Problem One  Risk for readmisison related to cancer diagnosis as evidenced by recent hospitalization  Role Documenting the Problem One  Care Management [place] for Problem One  Active  [facility] Long Term Goal   Member will not be readmitted to hospital within the next 31 days.  THN Long Term Goal Start Date  04/01/19  [facility] for Problem One Long Term Goal  Upcoming follow up with oncology and PCP reviewed with member. Provided appointment details and assessed need for transportation  [facility] CM Short Term Goal #1   Member will keep and attend follow up appointment with oncology within the next 2 weeks  THN CM Short Term Goal #1 Start Date  04/01/19  [facility] CM Short Term Goal #1 Met Date  04/29/19  THN CM Short Term Goal #2   Member will report taking medications as instructed over the next 4 weeks  THN CM Short Term Goal #2 Start Date  04/01/19  [facility] CM Short  Term Goal #2 Met Date  04/29/19  THN CM Short Term Goal #3  Member will report scheduling yearly eye exam within the next 4 weeks  THN CM Short Term Goal #3 Start Date  04/29/19  Interventions for Short [place] Goal #3  Member educated on importance of yearly diabetic eye exams.  Offered to schedule visit, advised to call to schedule as soon as possible.     [name], [place], MSN [place] [phone]

## 2019-05-12 ENCOUNTER — Other Ambulatory Visit: Payer: Self-pay | Admitting: Oncology

## 2019-05-12 MED ORDER — OXYCODONE-ACETAMINOPHEN 7.5-325 MG PO TABS: 1.0000 | ORAL_TABLET | Freq: Four times a day (QID) | ORAL | 0 refills | Status: DC | PRN

## 2019-05-14 ENCOUNTER — Inpatient Hospital Stay: Payer: Medicare Other | Attending: Oncology | Admitting: Oncology

## 2019-05-14 ENCOUNTER — Telehealth: Payer: Self-pay

## 2019-05-14 ENCOUNTER — Other Ambulatory Visit: Payer: Self-pay

## 2019-05-14 ENCOUNTER — Inpatient Hospital Stay: Payer: Medicare Other

## 2019-05-14 VITALS — BP 155/65 | HR 81 | Temp 98.2°F | Resp 18 | Ht 72.0 in | Wt 190.0 lb

## 2019-05-14 DIAGNOSIS — Z5111 Encounter for antineoplastic chemotherapy: Secondary | ICD-10-CM | POA: Diagnosis not present

## 2019-05-14 DIAGNOSIS — Z79818 Long term (current) use of other agents affecting estrogen receptors and estrogen levels: Secondary | ICD-10-CM | POA: Diagnosis not present

## 2019-05-14 DIAGNOSIS — C7951 Secondary malignant neoplasm of bone: Secondary | ICD-10-CM

## 2019-05-14 DIAGNOSIS — Z7982 Long term (current) use of aspirin: Secondary | ICD-10-CM | POA: Diagnosis not present

## 2019-05-14 DIAGNOSIS — Z79899 Other long term (current) drug therapy: Secondary | ICD-10-CM | POA: Insufficient documentation

## 2019-05-14 DIAGNOSIS — C61 Malignant neoplasm of prostate: Secondary | ICD-10-CM

## 2019-05-14 DIAGNOSIS — Z794 Long term (current) use of insulin: Secondary | ICD-10-CM | POA: Diagnosis not present

## 2019-05-14 LAB — CMP (CANCER CENTER ONLY)
ALT: 8 U/L (ref 0–44)
AST: 11 U/L — ABNORMAL LOW (ref 15–41)
Albumin: 3.1 g/dL — ABNORMAL LOW (ref 3.5–5.0)
Alkaline Phosphatase: 648 U/L — ABNORMAL HIGH (ref 38–126)
Anion gap: 10 (ref 5–15)
BUN: 19 mg/dL (ref 8–23)
CO2: 25 mmol/L (ref 22–32)
Calcium: 8.9 mg/dL (ref 8.9–10.3)
Chloride: 102 mmol/L (ref 98–111)
Creatinine: 0.87 mg/dL (ref 0.61–1.24)
GFR, Est AFR Am: >60 mL/min (ref >60)
GFR, Estimated: >60 mL/min (ref >60)
Glucose, Bld: 337 mg/dL — ABNORMAL HIGH (ref 70–99)
Potassium: 4.3 mmol/L (ref 3.5–5.1)
Sodium: 137 mmol/L (ref 135–145)
Total Bilirubin: 0.3 mg/dL (ref 0.3–1.2)
Total Protein: 7.5 g/dL (ref 6.5–8.1)

## 2019-05-14 LAB — CBC WITH DIFFERENTIAL (CANCER CENTER ONLY)
Abs Immature Granulocytes: 0.05 10*3/uL (ref 0.00–0.07)
Basophils Absolute: 0 10*3/uL (ref 0.0–0.1)
Basophils Relative: 0 %
Eosinophils Absolute: 0 10*3/uL (ref 0.0–0.5)
Eosinophils Relative: 1 %
HCT: 26.5 % — ABNORMAL LOW (ref 39.0–52.0)
Hemoglobin: 8.3 g/dL — ABNORMAL LOW (ref 13.0–17.0)
Immature Granulocytes: 1 %
Lymphocytes Relative: 27 %
Lymphs Abs: 1.4 10*3/uL (ref 0.7–4.0)
MCH: 29.5 pg (ref 26.0–34.0)
MCHC: 31.3 g/dL (ref 30.0–36.0)
MCV: 94.3 fL (ref 80.0–100.0)
Monocytes Absolute: 0.4 10*3/uL (ref 0.1–1.0)
Monocytes Relative: 9 %
Neutro Abs: 3.1 10*3/uL (ref 1.7–7.7)
Neutrophils Relative %: 62 %
Platelet Count: 156 10*3/uL (ref 150–400)
RBC: 2.81 MIL/uL — ABNORMAL LOW (ref 4.22–5.81)
RDW: 20.3 % — ABNORMAL HIGH (ref 11.5–15.5)
WBC Count: 5 10*3/uL (ref 4.0–10.5)
nRBC: 0.6 % — ABNORMAL HIGH (ref 0.0–0.2)

## 2019-05-14 MED ORDER — XTANDI 40 MG PO CAPS: 160.0000 mg | ORAL_CAPSULE | Freq: Every day | ORAL | 0 refills | Status: DC

## 2019-05-14 NOTE — Telephone Encounter (Signed)
Oral Oncology Patient Advocate Encounter  Prior Authorization for Stephen Lynch has been approved.    PA# [id number] Effective dates: 04/14/19 through 05/13/20  Patients co-pay is $3590.69  Oral Oncology Clinic will continue to follow.   [place] Patient [place] Phone [phone] Fax [phone] 05/14/2019 3:22 PM

## 2019-05-14 NOTE — Progress Notes (Signed)
Hematology and Oncology Follow Up Visit  Stephen Lynch [id number] 1945-02-20 74 y.o. 05/14/2019 1:06 PM Stephen Lynch, MDShadad, Stephen Dad, MD   Principle Diagnosis: 64 year old man with advanced prostate cancer diagnosed in February 2010.  He was found to have PSA 193, diffuse bone disease indicating castration-sensitive disease.   Prior Therapy:  He status post CT-guided biopsy of the right iliac bone.  Current therapy:   Stephen Lynch started on April 20, 2019.  Therapy will be switched to Stephen Lynch in April 2021.  Interim History: Stephen Lynch returns today for a follow-up visit.  Since the last visit, he received the first injection of Stephen Lynch without any complications.  Since that time he has felt better overall with improvement in his performance status, activity level and weight gain.  He has gained close to 9 pounds.  He denies any nausea, vomiting or hot flashes.  Denies any excessive fatigue or tiredness.  Continues to have periodic left-sided hip pain for which she takes Stephen Lynch and has improved his it at this time.  Quality of life is improving.    Medications: I have reviewed the patient's current medications.  Current Outpatient Medications  Medication Sig Dispense Refill  . acetaminophen (TYLENOL) 500 MG tablet Take 500 mg by mouth every 6 (six) hours as needed for mild pain.    . ASPIRIN 81 PO Take 81 mg by mouth daily.     . Calcium-Magnesium-Zinc 333-133-5 MG TABS Take 1 tablet by mouth daily.    Marland Kitchen doxycycline (VIBRA-TABS) 100 MG tablet     . DULoxetine (CYMBALTA) 60 MG capsule Take 60 mg by mouth daily.    Marland Kitchen ibuprofen (ADVIL) 200 MG tablet Take 200 mg by mouth every 6 (six) hours as needed for moderate pain.    Marland Kitchen insulin lispro (HUMALOG) 100 UNIT/ML KwikPen Junior Inject 0.2 mLs (20 Units total) into the skin 3 (three) times daily after meals. (Patient taking differently: Inject 7-25 Units into the skin 3 (three) times daily after meals. Per sliding scale) 3 mL 6  . lidocaine  (LIDODERM) 5 % Place 2 patches onto the skin daily. Remove & Discard patch within 12 hours or as directed by MD 30 patch 0  . Menthol-Camphor (TIGER BALM ARTHRITIS RUB EX) Apply 1 application topically 2 (two) times daily as needed (back pain).    . ondansetron (ZOFRAN) 4 MG tablet Take 1 tablet (4 mg total) by mouth every 6 (six) hours as needed for nausea. 20 tablet 0  . oxyCODONE-acetaminophen (Stephen Lynch) 7.5-325 MG tablet Take 1 tablet by mouth 4 (four) times daily as needed. 60 tablet 0  . rosuvastatin (CRESTOR) 10 MG tablet Take 1 tablet (10 mg total) by mouth daily. 90 tablet 3  . TRESIBA FLEXTOUCH 100 UNIT/ML SOPN FlexTouch Pen Inject 10 Units into the skin at bedtime.     Marland Kitchen ULTRAM 50 MG tablet Take 50 mg by mouth every 6 (six) hours as needed for pain.     No current facility-administered medications for this visit.     Allergies: No Known Allergies    Physical Exam: Blood pressure (!) 155/65, pulse 81, temperature 98.2 F (36.8 C), temperature source Temporal, resp. rate 18, height 6' (1.829 m), weight 190 lb (86.2 kg), SpO2 100 %.   ECOG: 1    General appearance: Comfortable appearing without any discomfort Head: Normocephalic without any trauma Oropharynx: Mucous membranes are moist and pink without any thrush or ulcers. Eyes: Pupils are equal and round reactive to light. Lymph nodes: No cervical,  supraclavicular, inguinal or axillary lymphadenopathy.   Heart:regular rate and rhythm.  S1 and S2 without leg edema. Lung: Clear without any rhonchi or wheezes.  No dullness to percussion. Abdomin: Soft, nontender, nondistended with good bowel sounds.  No hepatosplenomegaly. Musculoskeletal: No joint deformity or effusion.  Full range of motion noted. Neurological: No deficits noted on motor, sensory and deep tendon reflex exam. Skin: No petechial rash or dryness.  Appeared moist.       Lab Results: Lab Results  Component Value Date   WBC 6.2 04/22/2019   HGB 9.0 (L)  04/22/2019   HCT 30.6 (L) 04/22/2019   MCV 95.0 04/22/2019   PLT 465 (H) 04/22/2019     Chemistry      Component Value Date/Time   NA 134 (L) 03/25/2019 0233   K 4.2 03/25/2019 0233   CL 100 03/25/2019 0233   CO2 25 03/25/2019 0233   BUN 24 (H) 03/25/2019 0233   CREATININE 0.73 03/25/2019 0233      Component Value Date/Time   CALCIUM 8.2 (L) 03/25/2019 0233   ALKPHOS 273 (H) 03/25/2019 0233   AST 25 03/25/2019 0233   ALT 25 03/25/2019 0233   BILITOT 0.7 03/25/2019 0233         Impression and Plan:   74 year old man with:  1.    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  This was confirmed by a biopsy completed in March 2021.  The natural course of this disease was discussed at this time after he had disease confirmation as well as start androgen deprivation.  The risk of progression into castration hyper resistant disease would be quite given his advanced nature of disease.  Therapy escalation with Stephen Lynch or systemic chemotherapy were reiterated today.  Risks and benefits of all these approaches were reviewed.  After discussion today we decided to proceed with Stephen Lynch.  Complications including hypertension, fatigue, hematuria and rarely seizures were reiterated.  Plan to start with 160 mg daily.  2.  Androgen deprivation: He received Stephen Lynch and will receive Stephen Lynch starting next week.  Risks and benefits of long-term androgen deprivation include weight gain, hot flashes as well as osteoporosis.  3.  Bone directed therapy: I recommended calcium and vitamin D supplements and will consider Stephen Lynch after obtaining dental clearance.  4.  Prognosis and goals of care: Therapy remains palliative at this time although aggressive measures are warranted given his reasonable performance status.  5.  Anemia: Hemoglobin is relatively stable.  Anemia is related to malignancy and disease to bone.  6.  Hip pain: Related to  metastatic prostate cancer to the bone.  He is on Stephen Lynch with pain is manageable.  7.  Follow-up: He will return in the next week to receive Stephen Lynch and in 6 weeks for MD follow-up.   30  minutes were dedicated to this encounter.  The time was spent on reviewing the natural course of his disease, reviewing pathology results, treatment options and complications related to therapy.  Zola Button, MD 4/2/20211:06 PM

## 2019-05-14 NOTE — Telephone Encounter (Signed)
Oral Oncology Patient Advocate Encounter  Received notification from [place] that prior authorization for Gillermina Phy is required.  PA submitted on CoverMyMeds Key [id number] Status is pending  Oral Oncology Clinic will continue to follow.   [place] Patient [place] Phone [phone] Fax [phone] 05/14/2019 2:40 PM

## 2019-05-15 LAB — PROSTATE-SPECIFIC AG, SERUM (LABCORP): Prostate Specific Ag, Serum: 71.3 ng/mL — ABNORMAL HIGH (ref 0.0–4.0)

## 2019-05-17 ENCOUNTER — Telehealth: Payer: Self-pay

## 2019-05-17 NOTE — Telephone Encounter (Signed)
Called and spoke to Milas Gain, patient's fiance who is also on patient's designated party release. Informed [name] of patient's PSA level. [name] verbalized understanding and agreed to relay information to patient.

## 2019-05-17 NOTE — Telephone Encounter (Signed)
-----   Message from [name], MD sent at 05/17/2019  8:26 AM EDT ----- Please let him know his PSA is down.

## 2019-05-18 ENCOUNTER — Telehealth: Payer: Self-pay | Admitting: Pharmacist

## 2019-05-18 ENCOUNTER — Telehealth: Payer: Self-pay | Admitting: Oncology

## 2019-05-18 NOTE — Telephone Encounter (Signed)
Scheduled per 4/2 los. Called and spoke with [name] (sgo), confirmed 4/9 and 5/21 appts

## 2019-05-18 NOTE — Telephone Encounter (Signed)
Oral Oncology Pharmacist Encounter  Received new prescription for Xtandi (enzalutamide) for the treatment of castrate sensitive prostate cancer in conjunction with Eligard, planned duration until disease progression or unacceptable drug toxicity.  BP from 05/14/19 assessed, BP elevated continue to monitor once enzalutamide is started. Prescription dose and frequency assessed.   Current medication list in Epic reviewed, several DDIs with enzalutamide identified: - Xtandi may decrease the serum concentration of oxycodone, ondansetron, and tramadol. Monitor patient for decreased effectiveness of the listed medications. No baseline dose adjustment needed.   Prescription has been e-scribed to the [facility] for benefits analysis and approval.  Oral Oncology Clinic will continue to follow for insurance authorization, copayment issues, initial counseling and start date.  Darl Pikes, PharmD, BCPS, BCOP, CPP Hematology/Oncology Clinical Pharmacist [facility]/AP Oral [place] [facility] [phone]  05/18/2019 1:06 PM

## 2019-05-21 ENCOUNTER — Other Ambulatory Visit: Payer: Self-pay

## 2019-05-21 ENCOUNTER — Inpatient Hospital Stay: Payer: Medicare Other

## 2019-05-21 VITALS — BP 137/68 | HR 70 | Temp 98.0°F | Resp 20

## 2019-05-21 DIAGNOSIS — Z79818 Long term (current) use of other agents affecting estrogen receptors and estrogen levels: Secondary | ICD-10-CM | POA: Diagnosis not present

## 2019-05-21 DIAGNOSIS — C61 Malignant neoplasm of prostate: Secondary | ICD-10-CM | POA: Diagnosis not present

## 2019-05-21 DIAGNOSIS — Z79899 Other long term (current) drug therapy: Secondary | ICD-10-CM | POA: Diagnosis not present

## 2019-05-21 DIAGNOSIS — C7951 Secondary malignant neoplasm of bone: Secondary | ICD-10-CM | POA: Diagnosis not present

## 2019-05-21 DIAGNOSIS — Z5111 Encounter for antineoplastic chemotherapy: Secondary | ICD-10-CM | POA: Diagnosis not present

## 2019-05-21 DIAGNOSIS — Z7982 Long term (current) use of aspirin: Secondary | ICD-10-CM | POA: Diagnosis not present

## 2019-05-21 MED ORDER — LEUPROLIDE ACETATE (4 MONTH) 30 MG ~~LOC~~ KIT
30.0000 mg | PACK | Freq: Once | SUBCUTANEOUS | Status: AC
  Administered 2019-05-21: 30 mg via SUBCUTANEOUS
  Filled 2019-05-21: qty 30

## 2019-05-21 MED FILL — XTANDI 40 MG CAPSULE: 40 | 30 days supply | Qty: 120 | Fill #0

## 2019-05-21 NOTE — Patient Instructions (Signed)

## 2019-05-21 NOTE — Telephone Encounter (Signed)
Oral Chemotherapy Pharmacist Encounter  Stephen Lynch will deliver Xtandi on 05/24/19. He was instructed to get started when he receives his medication.  Patient Education I spoke with patient's significant other Stephen Lynch for overview of new oral chemotherapy medication: Xtandi (enzalutamide) for the treatment of castrate sensitive prostate cancer in conjunction with Eligard, planned duration until disease progression or unacceptable drug toxicity.   Counseled Stephen Lynch on administration, dosing, side effects, monitoring, drug-food interactions, safe handling, storage, and disposal. Patient will take 4 capsules (160 mg total) by mouth daily.  Side effects include but not limited to: fatigue, HTN, hot flashes.    Reviewed with Stephen Lynch importance of keeping a medication schedule and plan for any missed doses.  Stephen Lynch voiced understanding and appreciation. All questions answered. Medication handout placed in the mail.  Provided Stephen Lynch with Oral Chemotherapy Navigation Clinic phone number. They knows to call the office with questions or concerns. Oral Chemotherapy Navigation Clinic will continue to follow.  Darl Pikes, PharmD, BCPS, BCOP, CPP Hematology/Oncology Clinical Pharmacist [facility]/AP Oral [place] [facility] [phone]  05/21/2019 2:18 PM

## 2019-05-24 DIAGNOSIS — E1169 Type 2 diabetes mellitus with other specified complication: Secondary | ICD-10-CM | POA: Diagnosis not present

## 2019-05-24 DIAGNOSIS — C61 Malignant neoplasm of prostate: Secondary | ICD-10-CM | POA: Diagnosis not present

## 2019-05-24 DIAGNOSIS — I10 Essential (primary) hypertension: Secondary | ICD-10-CM | POA: Diagnosis not present

## 2019-05-24 DIAGNOSIS — M130 Polyarthritis, unspecified: Secondary | ICD-10-CM | POA: Diagnosis not present

## 2019-05-24 DIAGNOSIS — E785 Hyperlipidemia, unspecified: Secondary | ICD-10-CM | POA: Diagnosis not present

## 2019-05-27 ENCOUNTER — Other Ambulatory Visit: Payer: Self-pay | Admitting: *Deleted

## 2019-05-27 NOTE — Patient Outreach (Addendum)
[facility]) Care Management  05/27/2019  Stephen Lynch September 22, 1945 [id number]   Call placed to member to follow up on management of prostate cancer.  No answer, unable to leave message as mailbox is full.  Will follow up within the next 3-4 business days.  [name], [place], MSN [place] [phone]

## 2019-06-01 ENCOUNTER — Other Ambulatory Visit: Payer: Self-pay | Admitting: *Deleted

## 2019-06-01 NOTE — Patient Outreach (Signed)
[facility]) Care Management  06/01/2019  Stephen Lynch May 21, 1945 [id number]   Call placed to member to follow up on management of prostate cancer.  He report he is doing well, was seen a couple weeks ago and received an infusion.  State he is also taking oral chemo agents to treat cancer.  Has follow up appointment with oncology  On 5/21.  Denies having any side effects from the chemo treatment, encouraged to notify oncologist if any problems.  State he is still keeping an eye on his blood sugar, was 165 today.  Last office visit with PCP was last week.  Report he is considering a new endocrinologist, if he does decide he will discuss with PCP for referral.  Denies any urgent concerns, will follow up within the next month.  THN CM Care Plan Problem One     Most Recent Value  Care Plan Problem One  Risk for readmisison related to cancer diagnosis as evidenced by recent hospitalization  Role Documenting the Problem One  Care Management [place] for Problem One  Active  [facility] Long Term Goal   Member will not be readmitted to hospital within the next 31 days.  THN Long Term Goal Start Date  04/01/19  THN Long Term Goal Met Date  06/01/19  [facility] CM Short Term Goal #3  Member will report scheduling yearly eye exam within the next 4 weeks  THN CM Short Term Goal #3 Start Date  04/29/19  [facility] CM Short Term Goal #3 Met Date  06/01/19     [name], RN, MSN [place] [phone]

## 2019-06-14 ENCOUNTER — Other Ambulatory Visit: Payer: Self-pay | Admitting: Oncology

## 2019-06-17 ENCOUNTER — Telehealth: Payer: Self-pay | Admitting: Pharmacist

## 2019-06-17 DIAGNOSIS — C61 Malignant neoplasm of prostate: Secondary | ICD-10-CM

## 2019-06-17 MED ORDER — XTANDI 40 MG PO CAPS: 160.0000 mg | ORAL_CAPSULE | Freq: Every day | ORAL | 0 refills | Status: DC

## 2019-06-17 NOTE — Telephone Encounter (Signed)
Oral Chemotherapy Pharmacist Encounter  Due to insurance restriction the Xtandi could no longer be filled at [place]. Prescription has been redirected to [facility].  Supportive information was faxed to [place]. We will continue to follow medication access.   Called and notified patient, provided with new pharmacy number.  Darl Pikes, PharmD, BCPS, [facility] Hematology/Oncology Clinical Pharmacist [facility]/AP Oral [place] [facility] [phone]  06/17/2019 8:52 AM

## 2019-07-02 ENCOUNTER — Other Ambulatory Visit: Payer: Self-pay

## 2019-07-02 ENCOUNTER — Inpatient Hospital Stay: Payer: Medicare Other | Attending: Oncology | Admitting: Oncology

## 2019-07-02 ENCOUNTER — Inpatient Hospital Stay: Payer: Medicare Other

## 2019-07-02 VITALS — BP 171/76 | HR 67 | Temp 97.7°F | Resp 18 | Ht 72.0 in | Wt 203.1 lb

## 2019-07-02 DIAGNOSIS — Z5111 Encounter for antineoplastic chemotherapy: Secondary | ICD-10-CM | POA: Diagnosis not present

## 2019-07-02 DIAGNOSIS — Z7982 Long term (current) use of aspirin: Secondary | ICD-10-CM | POA: Insufficient documentation

## 2019-07-02 DIAGNOSIS — R232 Flushing: Secondary | ICD-10-CM | POA: Diagnosis not present

## 2019-07-02 DIAGNOSIS — D630 Anemia in neoplastic disease: Secondary | ICD-10-CM | POA: Diagnosis not present

## 2019-07-02 DIAGNOSIS — G893 Neoplasm related pain (acute) (chronic): Secondary | ICD-10-CM | POA: Diagnosis not present

## 2019-07-02 DIAGNOSIS — Z79899 Other long term (current) drug therapy: Secondary | ICD-10-CM | POA: Insufficient documentation

## 2019-07-02 DIAGNOSIS — C7951 Secondary malignant neoplasm of bone: Secondary | ICD-10-CM | POA: Insufficient documentation

## 2019-07-02 DIAGNOSIS — C61 Malignant neoplasm of prostate: Secondary | ICD-10-CM

## 2019-07-02 LAB — CBC WITH DIFFERENTIAL (CANCER CENTER ONLY)
Abs Immature Granulocytes: 0.01 10*3/uL (ref 0.00–0.07)
Basophils Absolute: 0 10*3/uL (ref 0.0–0.1)
Basophils Relative: 0 %
Eosinophils Absolute: 0.1 10*3/uL (ref 0.0–0.5)
Eosinophils Relative: 2 %
HCT: 31.3 % — ABNORMAL LOW (ref 39.0–52.0)
Hemoglobin: 10.2 g/dL — ABNORMAL LOW (ref 13.0–17.0)
Immature Granulocytes: 0 %
Lymphocytes Relative: 36 %
Lymphs Abs: 1.5 10*3/uL (ref 0.7–4.0)
MCH: 30.5 pg (ref 26.0–34.0)
MCHC: 32.6 g/dL (ref 30.0–36.0)
MCV: 93.7 fL (ref 80.0–100.0)
Monocytes Absolute: 0.3 10*3/uL (ref 0.1–1.0)
Monocytes Relative: 8 %
Neutro Abs: 2.4 10*3/uL (ref 1.7–7.7)
Neutrophils Relative %: 54 %
Platelet Count: 205 10*3/uL (ref 150–400)
RBC: 3.34 MIL/uL — ABNORMAL LOW (ref 4.22–5.81)
RDW: 18 % — ABNORMAL HIGH (ref 11.5–15.5)
WBC Count: 4.3 10*3/uL (ref 4.0–10.5)
nRBC: 0 % (ref 0.0–0.2)

## 2019-07-02 LAB — CMP (CANCER CENTER ONLY)
ALT: 6 U/L (ref 0–44)
AST: 10 U/L — ABNORMAL LOW (ref 15–41)
Albumin: 3.6 g/dL (ref 3.5–5.0)
Alkaline Phosphatase: 485 U/L — ABNORMAL HIGH (ref 38–126)
Anion gap: 10 (ref 5–15)
BUN: 18 mg/dL (ref 8–23)
CO2: 26 mmol/L (ref 22–32)
Calcium: 9.1 mg/dL (ref 8.9–10.3)
Chloride: 104 mmol/L (ref 98–111)
Creatinine: 1.01 mg/dL (ref 0.61–1.24)
GFR, Est AFR Am: >60 mL/min (ref >60)
GFR, Estimated: >60 mL/min (ref >60)
Glucose, Bld: 247 mg/dL — ABNORMAL HIGH (ref 70–99)
Potassium: 3.9 mmol/L (ref 3.5–5.1)
Sodium: 140 mmol/L (ref 135–145)
Total Bilirubin: 0.2 mg/dL — ABNORMAL LOW (ref 0.3–1.2)
Total Protein: 7 g/dL (ref 6.5–8.1)

## 2019-07-02 MED ORDER — OXYCODONE-ACETAMINOPHEN 7.5-325 MG PO TABS: 1.0000 | ORAL_TABLET | Freq: Four times a day (QID) | ORAL | 0 refills | Status: DC | PRN

## 2019-07-02 MED ORDER — ONDANSETRON HCL 4 MG PO TABS: 4.0000 mg | ORAL_TABLET | Freq: Four times a day (QID) | ORAL | 0 refills | Status: DC | PRN

## 2019-07-02 NOTE — Progress Notes (Signed)
Hematology and Oncology Follow Up Visit  Stephen Lynch [id number] 1946/02/07 74 y.o. 07/02/2019 3:01 PM [name], [name], [name], MD   Principle Diagnosis: 74 year old man with castration-sensitive prostate cancer with disease to the bone presented with PSA 193 in February 2021.   Prior Therapy:  He status post CT-guided biopsy of the right iliac bone.  Current therapy:   [name] started on April 20, 2019.  Therapy switched to Eligard in April 2021.  Xtandi 160 mg daily started in April 2021.   Interim History: Mr. Mu is here for return evaluation.  Since the last visit, he started [facility] and is not reporting any complications.  He does report some mild when he takes the stomach.  He denies any abdominal pain or discomfort.  He denies any hospitalization or illnesses.  Formal status continues to improve and his mobility has improved as well.  His bone pain continues to diminish and currently taking pain medication during the nighttime.  He is eating better and has gained more weight since last visit.    Medications: Updated on review. Current Outpatient Medications  Medication Sig Dispense Refill  . acetaminophen (TYLENOL) 500 MG tablet Take 500 mg by mouth every 6 (six) hours as needed for mild pain.    . ASPIRIN 81 PO Take 81 mg by mouth daily.     . Calcium-Magnesium-Zinc 333-133-5 MG TABS Take 1 tablet by mouth daily.    Marland Kitchen doxycycline (VIBRA-TABS) 100 MG tablet     . DULoxetine (CYMBALTA) 60 MG capsule Take 60 mg by mouth daily.    . enzalutamide (XTANDI) 40 MG capsule Take 4 capsules (160 mg total) by mouth daily. 120 capsule 0  . ibuprofen (ADVIL) 200 MG tablet Take 200 mg by mouth every 6 (six) hours as needed for moderate pain.    Marland Kitchen insulin lispro (HUMALOG) 100 UNIT/ML KwikPen Junior Inject 0.2 mLs (20 Units total) into the skin 3 (three) times daily after meals. (Patient taking differently: Inject 7-25 Units into the skin 3 (three) times daily after meals. Per sliding  scale) 3 mL 6  . lidocaine (LIDODERM) 5 % Place 2 patches onto the skin daily. Remove & Discard patch within 12 hours or as directed by MD 30 patch 0  . Menthol-Camphor (TIGER BALM ARTHRITIS RUB EX) Apply 1 application topically 2 (two) times daily as needed (back pain).    . ondansetron (ZOFRAN) 4 MG tablet Take 1 tablet (4 mg total) by mouth every 6 (six) hours as needed for nausea. 20 tablet 0  . oxyCODONE-acetaminophen (PERCOCET) 7.5-325 MG tablet Take 1 tablet by mouth 4 (four) times daily as needed. 60 tablet 0  . rosuvastatin (CRESTOR) 10 MG tablet Take 1 tablet (10 mg total) by mouth daily. 90 tablet 3  . TRESIBA FLEXTOUCH 100 UNIT/ML SOPN FlexTouch Pen Inject 10 Units into the skin at bedtime.     Marland Kitchen ULTRAM 50 MG tablet Take 50 mg by mouth every 6 (six) hours as needed for pain.     No current facility-administered medications for this visit.     Allergies: No Known Allergies    Physical Exam: Blood pressure (!) 171/76, pulse 67, temperature 97.7 F (36.5 C), temperature source Temporal, resp. rate 18, height 6' (1.829 m), weight 203 lb 1.6 oz (92.1 kg), SpO2 100 %.   ECOG: 1     General appearance: Alert, awake without any distress. Head: Atraumatic without abnormalities Oropharynx: Without any thrush or ulcers. Eyes: No scleral icterus. Lymph nodes: No lymphadenopathy  noted in the cervical, supraclavicular, or axillary nodes Heart:regular rate and rhythm, without any murmurs or gallops.   Lung: Clear to auscultation without any rhonchi, wheezes or dullness to percussion. Abdomin: Soft, nontender without any shifting dullness or ascites. Musculoskeletal: No clubbing or cyanosis. Neurological: No motor or sensory deficits. Skin: No rashes or lesions.       Lab Results: Lab Results  Component Value Date   WBC 4.3 07/02/2019   HGB 10.2 (L) 07/02/2019   HCT 31.3 (L) 07/02/2019   MCV 93.7 07/02/2019   PLT 205 07/02/2019     Chemistry      Component Value  Date/Time   NA 137 05/14/2019 1244   K 4.3 05/14/2019 1244   CL 102 05/14/2019 1244   CO2 25 05/14/2019 1244   BUN 19 05/14/2019 1244   CREATININE 0.87 05/14/2019 1244      Component Value Date/Time   CALCIUM 8.9 05/14/2019 1244   ALKPHOS 648 (H) 05/14/2019 1244   AST 11 (L) 05/14/2019 1244   ALT 8 05/14/2019 1244   BILITOT 0.3 05/14/2019 1244      Results for SHANDY, PORRES (MRN [id number]) as of 07/02/2019 15:04  Ref. Range 03/24/2019 08:22 05/14/2019 12:44  Prostate Specific Ag, Serum Latest Ref Range: 0.0 - 4.0 ng/mL  71.3 (H)  Prostatic Specific Antigen Latest Ref Range: 0.00 - 4.00 ng/mL 193.02 (H)      Impression and Plan:   74 year old man with:  1.    Advanced prostate cancer with disease to the bone diagnosed in February 2021.  He presented with castration-sensitive advanced disease.  He had continues to tolerate Xtandi reasonably well without any major complications.  The natural course of this disease was reviewed today and future treatment options were discussed.  He understands he is to take This medication continuously unless he develops progression of disease.  Switching to a different agent may be required in the future.  He is already experiencing excellent clinical benefit with a PSA decline.  2.  Androgen deprivation: He is currently on Eligard which was received in April and will be repeated in July 2021.  Long-term complication occluding weight gain and hot flashes were reiterated.  3.  Bone directed therapy: He is currently on calcium and vitamin D supplements which I recommended continuing.  Delton See will be initiated after obtaining dental clearance.  4.  Prognosis and goals of care: His disease is incurable although it is treatable and he is experiencing excellent clinical benefit and aggressive measures are warranted.  5.  Anemia: Related to malignancy and currently improving with his disease response.  6.  Bone pain: Related to metastatic prostate  cancer with improvement.  He is continuing to use oxycodone which will be refilled for him.   7.  Follow-up: In 2 months for repeat evaluation and next Eligard injection.   30  minutes were spent on this visit.  The time was dedicated to updating his disease status, discussing treatment options and addressing complication related to therapy.  Zola Button, MD 5/21/20213:01 PM

## 2019-07-03 LAB — PROSTATE-SPECIFIC AG, SERUM (LABCORP): Prostate Specific Ag, Serum: 3.3 ng/mL (ref 0.0–4.0)

## 2019-07-05 ENCOUNTER — Other Ambulatory Visit: Payer: Self-pay | Admitting: *Deleted

## 2019-07-05 NOTE — Patient Outreach (Signed)
[facility]) Care Management  07/05/2019  Derrich Dezarn December 08, 1945 [id number]   Call placed to member to follow up on management of chronic medical conditions as well as treatment for cancer.  Report he has been doing well with his treatment, last office visit was 5/21 with oncology.  Noted that blood pressure was elevated at 171/76, state it has not been elevated at home.  However, he is unable to report specific numbers/trends in the home.  He is also not able to report blood sugars for the past couple days, stating he is in need of a new patch of his glucose monitor, will call pharmacy today to get new ones.  Discussed importance of managing chronic conditions as his last A1C was elevated at 12.4.  He verbalizes understanding, stating he will start monitoring and recording daily.  Report his next appointment with PCP is in July, still saying he is wanting to get a new endocrinologist.  Advised to contact PCP to request referral so that he can proceed with scheduling visit in effort to manage diabetes.  Verbalizes understanding, denies any urgent concerns.  Will follow up within the next month.  THN CM Care Plan Problem One     Most Recent Value  Care Plan Problem One  Knowledge deficit regarding management of chronic medical conditions as evidenced by elevated A1C  Role Documenting the Problem One  Care Management [place] for Problem One  Active  THN Long Term Goal   Member's A1C will be dereased to </= 10 within the next 3 months  THN Long Term Goal Start Date  07/05/19  Interventions for Problem One Long Term Goal  Complications of uncontrolled diabetes reviewed with member.    THN CM Short Term Goal #1   Member will report checking and monitoring blood sugars at least twice a day over the next 4 weeks  THN CM Short Term Goal #1 Start Date  07/05/19  Interventions for Short Term Goal #1  Member educated on importance of monitoring AND recording blood sugar,  documenting trends to appropriately adjust insulin  THN CM Short Term Goal #2   Member will report appointment with new endocrinologist within the next 4 weeks  THN CM Short Term Goal #2 Start Date  07/05/19  Interventions for Short Term Goal #2  Educated on importance of close follow up with specialist in effort to adequately manage diabetes.  Advised to contact PCP for referral with new specialist     [name], RN, MSN [place] Manager [phone]

## 2019-07-07 ENCOUNTER — Telehealth: Payer: Self-pay | Admitting: Oncology

## 2019-07-07 ENCOUNTER — Telehealth: Payer: Self-pay

## 2019-07-07 DIAGNOSIS — E119 Type 2 diabetes mellitus without complications: Secondary | ICD-10-CM | POA: Diagnosis not present

## 2019-07-07 DIAGNOSIS — Z961 Presence of intraocular lens: Secondary | ICD-10-CM | POA: Diagnosis not present

## 2019-07-07 DIAGNOSIS — H04123 Dry eye syndrome of bilateral lacrimal glands: Secondary | ICD-10-CM | POA: Diagnosis not present

## 2019-07-07 DIAGNOSIS — H26491 Other secondary cataract, right eye: Secondary | ICD-10-CM | POA: Diagnosis not present

## 2019-07-07 NOTE — Telephone Encounter (Signed)
Pt called per Dr. Alen Blew relayed PSA results (3.3) Pt. Pleased with result. Pt. Verbalized understanding. No further problems or concerns noted.

## 2019-07-07 NOTE — Telephone Encounter (Signed)
Scheduled per 05/21 los, spoke with patient's relative and he should be notified of upcoming appointments.

## 2019-07-07 NOTE — Telephone Encounter (Signed)
-----   Message from [name], MD sent at 07/06/2019  9:05 AM EDT ----- Please let him know his PSA is coming down like we discussed which means impermeant in his cancer.

## 2019-07-09 ENCOUNTER — Other Ambulatory Visit: Payer: Self-pay | Admitting: Oncology

## 2019-07-09 DIAGNOSIS — C61 Malignant neoplasm of prostate: Secondary | ICD-10-CM

## 2019-08-02 ENCOUNTER — Other Ambulatory Visit: Payer: Self-pay | Admitting: *Deleted

## 2019-08-02 NOTE — Patient Outreach (Signed)
[facility]) Care Management  08/02/2019  Stephen Lynch 1945-08-09 [id number]   Call placed to member to follow up on management of chronic medical conditions.  He report he is progressing, noted that his PSA is decreasing since having treatment for his cancer.  Follow up appointment with oncologist is 7/14.  Report his blood sugar today was 188, will have follow up appointment with PCP on 7/6.  Admits that he still has not scheduled visit with endocrinologist, state the one he was referred to (Dr. Forde Dandy) is not taking new patients.  He is going to ask PCP again on 7/6 for another referral.  He verbalizes how important this is and will contact this care manager with any problems.  He does not have any urgent concerns, will send EMMI diabetes education (Diabetes: controlling your blood sugar, Diabetes: Why get your A1C checked, and Diabetes Meal Planning) and will follow up within the next month.  THN CM Care Plan Problem One     Most Recent Value  Care Plan Problem One Knowledge deficit regarding management of chronic medical conditions as evidenced by elevated A1C  Role Documenting the Problem One Care Management [place] for Problem One Active  THN Long Term Goal  Member's A1C will be dereased to </= 10 within the next 3 months  THN Long Term Goal Start Date 07/05/19  Interventions for Problem One Long Term Goal Member sent EMMI education regarding diabetes management  THN CM Short Term Goal #1  Member will report checking and monitoring blood sugars at least twice a day over the next 4 weeks  THN CM Short Term Goal #1 Start Date 07/05/19  [facility] CM Short Term Goal #1 Met Date 08/02/19  [employer] CM Short Term Goal #2  Member will report appointment with new endocrinologist within the next 4 weeks  THN CM Short Term Goal #2 Start Date 08/02/19  [Not met, date reset]  Interventions for Short Term Goal #2 Discussed importance of having specialist manage diabetes in effort  to help decrease A1C     [name], RN, MSN [place] Manager [phone]

## 2019-08-11 ENCOUNTER — Other Ambulatory Visit: Payer: Self-pay | Admitting: Oncology

## 2019-08-11 DIAGNOSIS — C61 Malignant neoplasm of prostate: Secondary | ICD-10-CM

## 2019-08-23 DIAGNOSIS — E1169 Type 2 diabetes mellitus with other specified complication: Secondary | ICD-10-CM | POA: Diagnosis not present

## 2019-08-23 DIAGNOSIS — L601 Onycholysis: Secondary | ICD-10-CM | POA: Diagnosis not present

## 2019-08-23 DIAGNOSIS — Z6827 Body mass index (BMI) 27.0-27.9, adult: Secondary | ICD-10-CM | POA: Diagnosis not present

## 2019-08-23 DIAGNOSIS — C61 Malignant neoplasm of prostate: Secondary | ICD-10-CM | POA: Diagnosis not present

## 2019-08-24 ENCOUNTER — Telehealth: Payer: Self-pay

## 2019-08-24 NOTE — Telephone Encounter (Signed)
-----   Message from [name], MD sent at 08/24/2019 11:30 AM EDT ----- Regarding: RE: Eligard ~2 weeks early Will keep his MD visit as is. Will push injection for 2 weeks. Thanks ----- Message ----- From: Delane Ginger, [facility] Sent: 08/24/2019  11:20 AM EDT To: Tami Lin, RN, [name], MD Subject: Eligard ~2 weeks early                         Hi Dr. Alen Blew,   Patient is scheduled for eligard tomorrow, 7/14 (sees you beforehand). Last Eligard 30mg  injection was 05/16/19. He's about 2 weeks early for injection as scheduled now. Are you okay with this or do you want his appointments moved out a bit?   Thanks!Maggie

## 2019-08-25 ENCOUNTER — Inpatient Hospital Stay: Payer: Medicare Other

## 2019-08-25 ENCOUNTER — Inpatient Hospital Stay: Payer: Medicare Other | Attending: Oncology

## 2019-08-25 ENCOUNTER — Other Ambulatory Visit: Payer: Self-pay

## 2019-08-25 ENCOUNTER — Inpatient Hospital Stay (HOSPITAL_BASED_OUTPATIENT_CLINIC_OR_DEPARTMENT_OTHER): Payer: Medicare Other | Admitting: Oncology

## 2019-08-25 VITALS — BP 165/68 | HR 66 | Temp 97.2°F | Resp 16 | Ht 72.0 in | Wt 211.6 lb

## 2019-08-25 DIAGNOSIS — Z7982 Long term (current) use of aspirin: Secondary | ICD-10-CM | POA: Diagnosis not present

## 2019-08-25 DIAGNOSIS — C7951 Secondary malignant neoplasm of bone: Secondary | ICD-10-CM | POA: Insufficient documentation

## 2019-08-25 DIAGNOSIS — D649 Anemia, unspecified: Secondary | ICD-10-CM | POA: Diagnosis not present

## 2019-08-25 DIAGNOSIS — G8929 Other chronic pain: Secondary | ICD-10-CM | POA: Insufficient documentation

## 2019-08-25 DIAGNOSIS — Z79899 Other long term (current) drug therapy: Secondary | ICD-10-CM | POA: Diagnosis not present

## 2019-08-25 DIAGNOSIS — I10 Essential (primary) hypertension: Secondary | ICD-10-CM | POA: Insufficient documentation

## 2019-08-25 DIAGNOSIS — C61 Malignant neoplasm of prostate: Secondary | ICD-10-CM | POA: Insufficient documentation

## 2019-08-25 DIAGNOSIS — Z5111 Encounter for antineoplastic chemotherapy: Secondary | ICD-10-CM | POA: Diagnosis not present

## 2019-08-25 LAB — CBC WITH DIFFERENTIAL (CANCER CENTER ONLY)
Abs Immature Granulocytes: 0.01 10*3/uL (ref 0.00–0.07)
Basophils Absolute: 0 10*3/uL (ref 0.0–0.1)
Basophils Relative: 1 %
Eosinophils Absolute: 0.1 10*3/uL (ref 0.0–0.5)
Eosinophils Relative: 4 %
HCT: 35.6 % — ABNORMAL LOW (ref 39.0–52.0)
Hemoglobin: 11.5 g/dL — ABNORMAL LOW (ref 13.0–17.0)
Immature Granulocytes: 0 %
Lymphocytes Relative: 33 %
Lymphs Abs: 1 10*3/uL (ref 0.7–4.0)
MCH: 30.1 pg (ref 26.0–34.0)
MCHC: 32.3 g/dL (ref 30.0–36.0)
MCV: 93.2 fL (ref 80.0–100.0)
Monocytes Absolute: 0.2 10*3/uL (ref 0.1–1.0)
Monocytes Relative: 8 %
Neutro Abs: 1.7 10*3/uL (ref 1.7–7.7)
Neutrophils Relative %: 54 %
Platelet Count: 175 10*3/uL (ref 150–400)
RBC: 3.82 MIL/uL — ABNORMAL LOW (ref 4.22–5.81)
RDW: 14.4 % (ref 11.5–15.5)
WBC Count: 3.1 10*3/uL — ABNORMAL LOW (ref 4.0–10.5)
nRBC: 0 % (ref 0.0–0.2)

## 2019-08-25 LAB — CMP (CANCER CENTER ONLY)
ALT: 8 U/L (ref 0–44)
AST: 12 U/L — ABNORMAL LOW (ref 15–41)
Albumin: 3.7 g/dL (ref 3.5–5.0)
Alkaline Phosphatase: 208 U/L — ABNORMAL HIGH (ref 38–126)
Anion gap: 10 (ref 5–15)
BUN: 20 mg/dL (ref 8–23)
CO2: 26 mmol/L (ref 22–32)
Calcium: 9.4 mg/dL (ref 8.9–10.3)
Chloride: 106 mmol/L (ref 98–111)
Creatinine: 1.19 mg/dL (ref 0.61–1.24)
GFR, Est AFR Am: >60 mL/min (ref >60)
GFR, Estimated: >60 mL/min (ref >60)
Glucose, Bld: 384 mg/dL — ABNORMAL HIGH (ref 70–99)
Potassium: 4.4 mmol/L (ref 3.5–5.1)
Sodium: 142 mmol/L (ref 135–145)
Total Bilirubin: 0.2 mg/dL — ABNORMAL LOW (ref 0.3–1.2)
Total Protein: 7.2 g/dL (ref 6.5–8.1)

## 2019-08-25 NOTE — Progress Notes (Signed)
Hematology and Oncology Follow Up Visit  Stephen Lynch [id number] March 12, 1945 74 y.o. 08/25/2019 1:17 PM Stephen Lynch, [name], Stephen Rude, MD   Principle Diagnosis: 45 year old man with advanced prostate cancer with bone disease presented with PSA 193 in February 2021.  He has castration-sensitive disease.   Prior Therapy:  He status post CT-guided biopsy of the right iliac bone.  Stephen Lynch started on April 20, 2019.  Current therapy:    Eligard 30 mg started in April 2021.  Next Eligard will be given in July 2021.  Xtandi 160 mg daily started in April 2021.   Interim History: Stephen Lynch returns today for a follow-up visit.  Since the last visit, he continues to report improvement in his overall health.  He has tolerated Xtandi without any new complaints.  He denies any nausea, vomiting or abdominal pain.  He denies any excessive fatigue or tiredness.  He does report chronic pain although has improved dramatically but does use oxycodone periodically.  Pain is predominantly in his shoulder.  Denies any recent hospitalization or illnesses.   Medications: Reviewed without changes. Current Outpatient Medications  Medication Sig Dispense Refill  . acetaminophen (TYLENOL) 500 MG tablet Take 500 mg by mouth every 6 (six) hours as needed for mild pain.    . ASPIRIN 81 PO Take 81 mg by mouth daily.     . Calcium-Magnesium-Zinc 333-133-5 MG TABS Take 1 tablet by mouth daily.    Marland Kitchen doxycycline (VIBRA-TABS) 100 MG tablet     . DULoxetine (CYMBALTA) 60 MG capsule Take 60 mg by mouth daily.    Marland Kitchen ibuprofen (ADVIL) 200 MG tablet Take 200 mg by mouth every 6 (six) hours as needed for moderate pain.    Marland Kitchen insulin lispro (HUMALOG) 100 UNIT/ML KwikPen Junior Inject 0.2 mLs (20 Units total) into the skin 3 (three) times daily after meals. (Patient taking differently: Inject 7-25 Units into the skin 3 (three) times daily after meals. Per sliding scale) 3 mL 6  . lidocaine (LIDODERM) 5 % Place 2 patches onto the  skin daily. Remove & Discard patch within 12 hours or as directed by MD 30 patch 0  . Menthol-Camphor (TIGER BALM ARTHRITIS RUB EX) Apply 1 application topically 2 (two) times daily as needed (back pain).    . ondansetron (ZOFRAN) 4 MG tablet Take 1 tablet (4 mg total) by mouth every 6 (six) hours as needed for nausea. 20 tablet 0  . oxyCODONE-acetaminophen (PERCOCET) 7.5-325 MG tablet Take 1 tablet by mouth 4 (four) times daily as needed. 60 tablet 0  . rosuvastatin (CRESTOR) 10 MG tablet Take 1 tablet (10 mg total) by mouth daily. 90 tablet 3  . TRESIBA FLEXTOUCH 100 UNIT/ML SOPN FlexTouch Pen Inject 10 Units into the skin at bedtime.     Marland Kitchen ULTRAM 50 MG tablet Take 50 mg by mouth every 6 (six) hours as needed for pain.    Stephen Lynch 40 MG capsule TAKE 4 CAPSULES (160 MG TOTAL) BY MOUTH DAILY. 120 capsule 0   No current facility-administered medications for this visit.     Allergies: No Known Allergies    Physical Exam: Blood pressure (!) 165/68, pulse 66, temperature (!) 97.2 F (36.2 C), temperature source Temporal, resp. rate 16, height 6' (1.829 m), weight 211 lb 9.6 oz (96 kg), SpO2 100 %.   ECOG: 1     General appearance: Comfortable appearing without any discomfort Head: Normocephalic without any trauma Oropharynx: Mucous membranes are moist and pink without any thrush or ulcers.  Eyes: Pupils are equal and round reactive to light. Lymph nodes: No cervical, supraclavicular, inguinal or axillary lymphadenopathy.   Heart:regular rate and rhythm.  S1 and S2 without leg edema. Lung: Clear without any rhonchi or wheezes.  No dullness to percussion. Abdomin: Soft, nontender, nondistended with good bowel sounds.  No hepatosplenomegaly. Musculoskeletal: No joint deformity or effusion.  Full range of motion noted. Neurological: No deficits noted on motor, sensory and deep tendon reflex exam. Skin: No petechial rash or dryness.  Appeared moist.         Lab Results: Lab Results   Component Value Date   WBC 3.1 (L) 08/25/2019   HGB 11.5 (L) 08/25/2019   HCT 35.6 (L) 08/25/2019   MCV 93.2 08/25/2019   PLT 175 08/25/2019     Chemistry      Component Value Date/Time   NA 140 07/02/2019 1422   K 3.9 07/02/2019 1422   CL 104 07/02/2019 1422   CO2 26 07/02/2019 1422   BUN 18 07/02/2019 1422   CREATININE 1.01 07/02/2019 1422      Component Value Date/Time   CALCIUM 9.1 07/02/2019 1422   ALKPHOS 485 (H) 07/02/2019 1422   AST 10 (L) 07/02/2019 1422   ALT 6 07/02/2019 1422   BILITOT 0.2 (L) 07/02/2019 1422       Results for Stephen Lynch (MRN [id number]) as of 08/25/2019 13:20  Ref. Range 03/24/2019 08:22 05/14/2019 12:44 07/02/2019 14:22  Prostate Specific Ag, Serum Latest Ref Range: 0.0 - 4.0 ng/mL  71.3 (H) 3.3  Prostatic Specific Antigen Latest Ref Range: 0.00 - 4.00 ng/mL 193.02 (H)       Impression and Plan:   74 year old man with:  1.    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    The natural course of this disease was reviewed today patient in detail.  Continues to be on Xtandi with excellent PSA response that is currently down to 3.3.  Risks and benefits of continuing this treatment long-term were reviewed.  Complication clinic hypertension fatigue among others were reiterated.  Alternative options such as chemotherapy will be deferred to a later date.  He is agreeable to continue at this time.  2.  Androgen deprivation: He will receive Eligard in the end of July repeated in 4 months.  Complications occluding weight gain, hot flashes among others were discussed.  3.  Bone directed therapy: I recommended calcium and vitamin D supplements and proceeding with Xgeva obtaining dental clearance.  4.  Prognosis and goals of care: Therapy remains palliative at this time although aggressive measures are warranted given his excellent performance status.  5.  Anemia: Continues to improve with the treatment  of his malignancy.  His anemia is related to prostate cancer.  6.  Bone pain: Manageable with the current pain regimen at this time..   7.  Follow-up: He will return in 2 months and repeat Eligard injection in 4 months.   30  minutes were dedicated to this encounter.  The time was spent on reviewing his disease status, discussing treatment options and future plan of care reviewed.  Zola Button, MD 7/14/20211:17 PM

## 2019-08-26 ENCOUNTER — Telehealth: Payer: Self-pay

## 2019-08-26 LAB — PROSTATE-SPECIFIC AG, SERUM (LABCORP): Prostate Specific Ag, Serum: 1 ng/mL (ref 0.0–4.0)

## 2019-08-26 NOTE — Telephone Encounter (Signed)
-----   Message from [name], MD sent at 08/26/2019  8:35 AM EDT ----- Please let him know his PSA is down

## 2019-08-26 NOTE — Telephone Encounter (Signed)
Called patient and made him aware of PSA result. He verbalized understanding.  °

## 2019-08-27 ENCOUNTER — Telehealth: Payer: Self-pay | Admitting: Oncology

## 2019-08-27 DIAGNOSIS — E1169 Type 2 diabetes mellitus with other specified complication: Secondary | ICD-10-CM | POA: Diagnosis not present

## 2019-08-27 DIAGNOSIS — I10 Essential (primary) hypertension: Secondary | ICD-10-CM | POA: Diagnosis not present

## 2019-08-27 NOTE — Telephone Encounter (Signed)
Scheduled per 07/14 los, patient has been called and notified. 

## 2019-08-30 ENCOUNTER — Other Ambulatory Visit: Payer: Self-pay | Admitting: *Deleted

## 2019-08-30 NOTE — Patient Outreach (Signed)
[place] [facility]) Care Management  08/30/2019  Lenford Beddow [birth year] [id number]   Monthly call placed to member to follow up on management of chronic conditions, no answer.  HIPAA compliant voice message left, will follow up within the next 3-4 business days.  [name], [place], MSN [place] [phone]

## 2019-09-03 ENCOUNTER — Other Ambulatory Visit: Payer: Self-pay | Admitting: *Deleted

## 2019-09-03 NOTE — Patient Outreach (Signed)
[facility]) Care Management  09/03/2019  Stephen Lynch Sep 27, 1945 [id number]   Outreach attempt #2, successful.  Call placed to member to follow up on management of chronic medical conditions.  State he is doing well, still having some pain, report he has called his oncologist to refill pian meds.  Was seen by his PCP on 7/6, state medication regime for diabetes management was changed, now taking [place] every morning.  State his blood sugars has been "up and down."  Was in the 280's today, yesterday was 119.  State he advised PCP that the referred endocrinologist was no longer taking new patients, his fiance is looking for another one to schedule visit.  If she need assistance, she will contact this care manager.  He will have follow up with PCP on 8/27 to evaluate response to new medication regime.  He denies any urgent concerns at this time, will contact this care manager with questions.  Will follow up within the next month.  Goals Addressed              This Visit's Progress   .  I want to keep getting better with my diabetes (pt-stated)        CARE PLAN ENTRY (see longtitudinal plan of care for additional care plan information)  Objective:  . No results found for: HGBA1C Lab Results  Component Value Date   CREATININE 1.19 08/25/2019   CREATININE 1.01 07/02/2019   CREATININE 0.87 05/14/2019 .   Marland Kitchen No results found for: EGFR  Current Barriers:  Marland Kitchen Knowledge Deficits related to basic Diabetes pathophysiology and self care/management . Knowledge Deficits related to medications used for management of diabetes  Case Manager Clinical Goal(s):  Over the next 31 days, patient will demonstrate improved adherence to prescribed treatment plan for diabetes self care/management as evidenced by: decreased A1C to </= 8 . Verbalize daily monitoring and recording of CBG within 28 days . Verbalize adherence to prescribed medication regimen within the next 28 days    Interventions:  . Discussed plans with patient for ongoing care management follow up and provided patient with direct contact information for care management team . Reviewed scheduled/upcoming provider appointments including: PCP 8/27, will make endocrinologist appointment . Advised patient, providing education and rationale, to check cbg twice daily and record, calling PCP for findings outside established parameters.    Patient Self Care Activities:  . Self administers insulin as prescribed . Attends all scheduled provider appointments . Checks blood sugars as prescribed and utilize hyper and hypoglycemia protocol as needed  Initial goal documentation       Stephen David, RN, MSN [place] Manager [phone]

## 2019-09-08 ENCOUNTER — Inpatient Hospital Stay: Payer: Medicare Other

## 2019-09-08 ENCOUNTER — Other Ambulatory Visit: Payer: Self-pay | Admitting: Oncology

## 2019-09-08 ENCOUNTER — Ambulatory Visit: Payer: Medicare Other

## 2019-09-08 ENCOUNTER — Other Ambulatory Visit: Payer: Self-pay

## 2019-09-08 VITALS — BP 157/78 | HR 60 | Temp 97.7°F | Resp 18

## 2019-09-08 DIAGNOSIS — G8929 Other chronic pain: Secondary | ICD-10-CM | POA: Diagnosis not present

## 2019-09-08 DIAGNOSIS — C61 Malignant neoplasm of prostate: Secondary | ICD-10-CM

## 2019-09-08 DIAGNOSIS — Z5111 Encounter for antineoplastic chemotherapy: Secondary | ICD-10-CM | POA: Diagnosis not present

## 2019-09-08 DIAGNOSIS — D649 Anemia, unspecified: Secondary | ICD-10-CM | POA: Diagnosis not present

## 2019-09-08 DIAGNOSIS — I10 Essential (primary) hypertension: Secondary | ICD-10-CM | POA: Diagnosis not present

## 2019-09-08 DIAGNOSIS — C7951 Secondary malignant neoplasm of bone: Secondary | ICD-10-CM | POA: Diagnosis not present

## 2019-09-08 MED ORDER — LEUPROLIDE ACETATE (4 MONTH) 30 MG ~~LOC~~ KIT
30.0000 mg | PACK | Freq: Once | SUBCUTANEOUS | Status: AC
  Administered 2019-09-08: 30 mg via SUBCUTANEOUS

## 2019-09-08 MED ORDER — LEUPROLIDE ACETATE (4 MONTH) 30 MG ~~LOC~~ KIT
PACK | SUBCUTANEOUS | Status: AC
  Filled 2019-09-08: qty 30

## 2019-09-08 MED ORDER — OXYCODONE-ACETAMINOPHEN 7.5-325 MG PO TABS: 1.0000 | ORAL_TABLET | Freq: Four times a day (QID) | ORAL | 0 refills | Status: DC | PRN

## 2019-09-08 NOTE — Patient Instructions (Signed)

## 2019-09-09 ENCOUNTER — Other Ambulatory Visit: Payer: Self-pay | Admitting: Oncology

## 2019-09-09 DIAGNOSIS — C61 Malignant neoplasm of prostate: Secondary | ICD-10-CM

## 2019-10-01 ENCOUNTER — Other Ambulatory Visit: Payer: Self-pay | Admitting: *Deleted

## 2019-10-01 NOTE — Patient Outreach (Signed)
Stephen Lynch) Care Management  10/01/2019  Stephen Lynch [birth year] [id number]   Monthly call placed to member to follow up on management of chronic medical conditions.  State he has been doing "much better."  Report he feel his blood sugars are better, today was 202.  His A1C has decreased from 12.4 in December to 10.1 in July.  He is sill working on improving his diet and exercise routines.  He is no longer receiving infusions for prostate cancer, state he is only on oral medications.  Has follow up with oncologist and PCP next month.  Denies any urgent concern, encouraged to contact this care manager with questions.  Will send diabetes management education and follow up within the next month.  Goals Addressed              This Visit's Progress   .  I want to keep getting better with my diabetes (pt-stated)   On track     [place] (see longtitudinal plan of care for additional care plan information)  Objective:  . No results found for: HGBA1C Lab Results  Component Value Date   CREATININE 1.19 08/25/2019   CREATININE 1.01 07/02/2019   CREATININE 0.87 05/14/2019 .   Marland Kitchen No results found for: EGFR  Current Barriers:  Marland Kitchen Knowledge Deficits related to basic Diabetes pathophysiology and self care/management . Knowledge Deficits related to medications used for management of diabetes  Case Manager Clinical Goal(s):  Over the next 31 days, patient will demonstrate improved adherence to prescribed treatment plan for diabetes self care/management as evidenced by: decreased A1C to </= 8 . Verbalize daily monitoring and recording of CBG within 28 days . Verbalize adherence to prescribed medication regimen within the next 28 days   Interventions:  . Discussed plans with patient for ongoing care management follow up and provided patient with direct contact information for care management team . Reviewed scheduled/upcoming provider appointments including: PCP 8/27,  will make endocrinologist appointment . Advised patient, providing education and rationale, to check cbg twice daily and record, calling PCP for findings outside established parameters.    Patient Self Care Activities:  . Self administers insulin as prescribed . Attends all scheduled provider appointments . Checks blood sugars as prescribed and utilize hyper and hypoglycemia protocol as needed  Initial goal documentation       Stephen David, RN, MSN [place] Manager [phone]

## 2019-10-03 ENCOUNTER — Other Ambulatory Visit: Payer: Self-pay | Admitting: Oncology

## 2019-10-03 DIAGNOSIS — C61 Malignant neoplasm of prostate: Secondary | ICD-10-CM

## 2019-10-08 DIAGNOSIS — I10 Essential (primary) hypertension: Secondary | ICD-10-CM | POA: Diagnosis not present

## 2019-10-08 DIAGNOSIS — E1165 Type 2 diabetes mellitus with hyperglycemia: Secondary | ICD-10-CM | POA: Diagnosis not present

## 2019-10-08 DIAGNOSIS — C61 Malignant neoplasm of prostate: Secondary | ICD-10-CM | POA: Diagnosis not present

## 2019-10-26 ENCOUNTER — Inpatient Hospital Stay: Payer: Medicare Other | Attending: Oncology

## 2019-10-26 ENCOUNTER — Other Ambulatory Visit: Payer: Self-pay

## 2019-10-26 ENCOUNTER — Inpatient Hospital Stay (HOSPITAL_BASED_OUTPATIENT_CLINIC_OR_DEPARTMENT_OTHER): Payer: Medicare Other | Admitting: Oncology

## 2019-10-26 VITALS — BP 147/74 | HR 92 | Temp 96.5°F | Resp 18 | Ht 72.0 in | Wt 216.4 lb

## 2019-10-26 DIAGNOSIS — Z191 Hormone sensitive malignancy status: Secondary | ICD-10-CM | POA: Insufficient documentation

## 2019-10-26 DIAGNOSIS — Z79899 Other long term (current) drug therapy: Secondary | ICD-10-CM | POA: Insufficient documentation

## 2019-10-26 DIAGNOSIS — Z7982 Long term (current) use of aspirin: Secondary | ICD-10-CM | POA: Insufficient documentation

## 2019-10-26 DIAGNOSIS — C7951 Secondary malignant neoplasm of bone: Secondary | ICD-10-CM | POA: Insufficient documentation

## 2019-10-26 DIAGNOSIS — D630 Anemia in neoplastic disease: Secondary | ICD-10-CM | POA: Diagnosis not present

## 2019-10-26 DIAGNOSIS — C61 Malignant neoplasm of prostate: Secondary | ICD-10-CM

## 2019-10-26 DIAGNOSIS — M898X9 Other specified disorders of bone, unspecified site: Secondary | ICD-10-CM | POA: Diagnosis not present

## 2019-10-26 LAB — CMP (CANCER CENTER ONLY)
ALT: 7 U/L (ref 0–44)
AST: 12 U/L — ABNORMAL LOW (ref 15–41)
Albumin: 3.7 g/dL (ref 3.5–5.0)
Alkaline Phosphatase: 119 U/L (ref 38–126)
Anion gap: 8 (ref 5–15)
BUN: 24 mg/dL — ABNORMAL HIGH (ref 8–23)
CO2: 30 mmol/L (ref 22–32)
Calcium: 9.8 mg/dL (ref 8.9–10.3)
Chloride: 104 mmol/L (ref 98–111)
Creatinine: 1.22 mg/dL (ref 0.61–1.24)
GFR, Est AFR Am: >60 mL/min (ref >60)
GFR, Estimated: 59 mL/min — ABNORMAL LOW (ref >60)
Glucose, Bld: 91 mg/dL (ref 70–99)
Potassium: 3.9 mmol/L (ref 3.5–5.1)
Sodium: 142 mmol/L (ref 135–145)
Total Bilirubin: 0.3 mg/dL (ref 0.3–1.2)
Total Protein: 7.6 g/dL (ref 6.5–8.1)

## 2019-10-26 LAB — CBC WITH DIFFERENTIAL (CANCER CENTER ONLY)
Abs Immature Granulocytes: 0.02 10*3/uL (ref 0.00–0.07)
Basophils Absolute: 0 10*3/uL (ref 0.0–0.1)
Basophils Relative: 1 %
Eosinophils Absolute: 0.1 10*3/uL (ref 0.0–0.5)
Eosinophils Relative: 3 %
HCT: 37.3 % — ABNORMAL LOW (ref 39.0–52.0)
Hemoglobin: 12.1 g/dL — ABNORMAL LOW (ref 13.0–17.0)
Immature Granulocytes: 0 %
Lymphocytes Relative: 33 %
Lymphs Abs: 1.6 10*3/uL (ref 0.7–4.0)
MCH: 29.4 pg (ref 26.0–34.0)
MCHC: 32.4 g/dL (ref 30.0–36.0)
MCV: 90.5 fL (ref 80.0–100.0)
Monocytes Absolute: 0.5 10*3/uL (ref 0.1–1.0)
Monocytes Relative: 10 %
Neutro Abs: 2.7 10*3/uL (ref 1.7–7.7)
Neutrophils Relative %: 53 %
Platelet Count: 206 10*3/uL (ref 150–400)
RBC: 4.12 MIL/uL — ABNORMAL LOW (ref 4.22–5.81)
RDW: 13.2 % (ref 11.5–15.5)
WBC Count: 5 10*3/uL (ref 4.0–10.5)
nRBC: 0 % (ref 0.0–0.2)

## 2019-10-26 MED ORDER — OXYCODONE-ACETAMINOPHEN 7.5-325 MG PO TABS: 1.0000 | ORAL_TABLET | Freq: Four times a day (QID) | ORAL | 0 refills | Status: DC | PRN

## 2019-10-26 NOTE — Progress Notes (Signed)
Hematology and Oncology Follow Up Visit  Stephen Lynch [id number] 11-Feb-1946 74 y.o. 10/26/2019 2:47 PM Stephen Lynch, [name], Stephen Rude, MD   Principle Diagnosis: 56 year old man with castration-sensitive prostate cancer diagnosed in February 2021.  He was found to have PSA 193 and bone involvement.     Prior Therapy:  He status post CT-guided biopsy of the right iliac bone.  Stephen Lynch started on April 20, 2019.  Current therapy:    Eligard 30 mg started in April 2021.  Next Eligard will be given in November 2021.  Xtandi 160 mg daily started in April 2021.   Interim History: Stephen Lynch returns today for a repeat evaluation.  Since the last visit, he reports no major changes in his health. He denied any nausea, vomiting or abdominal pain. He is eating better and has gained more weight. He had denied any worsening bone pain and does not report periodic shoulder and neck pain. He has tolerated Xtandi without any new issues. Denies excessive fatigue, tiredness or failure to thrive.  Medications: Updated on review. Current Outpatient Medications  Medication Sig Dispense Refill  . acetaminophen (TYLENOL) 500 MG tablet Take 500 mg by mouth every 6 (six) hours as needed for mild pain.    . ASPIRIN 81 PO Take 81 mg by mouth daily.     . Calcium-Magnesium-Zinc 333-133-5 MG TABS Take 1 tablet by mouth daily.    Marland Kitchen doxycycline (VIBRA-TABS) 100 MG tablet     . DULoxetine (CYMBALTA) 60 MG capsule Take 60 mg by mouth daily.    Marland Kitchen ibuprofen (ADVIL) 200 MG tablet Take 200 mg by mouth every 6 (six) hours as needed for moderate pain.    Marland Kitchen insulin lispro (HUMALOG) 100 UNIT/ML KwikPen Junior Inject 0.2 mLs (20 Units total) into the skin 3 (three) times daily after meals. (Patient taking differently: Inject 7-25 Units into the skin 3 (three) times daily after meals. Per sliding scale) 3 mL 6  . lidocaine (LIDODERM) 5 % Place 2 patches onto the skin daily. Remove & Discard patch within 12 hours or as directed by  MD 30 patch 0  . Menthol-Camphor (TIGER BALM ARTHRITIS RUB EX) Apply 1 application topically 2 (two) times daily as needed (back pain).    . ondansetron (ZOFRAN) 4 MG tablet Take 1 tablet (4 mg total) by mouth every 6 (six) hours as needed for nausea. 20 tablet 0  . oxyCODONE-acetaminophen (PERCOCET) 7.5-325 MG tablet Take 1 tablet by mouth 4 (four) times daily as needed. 60 tablet 0  . rosuvastatin (CRESTOR) 10 MG tablet Take 1 tablet (10 mg total) by mouth daily. 90 tablet 3  . TRESIBA FLEXTOUCH 100 UNIT/ML SOPN FlexTouch Pen Inject 10 Units into the skin at bedtime.     Marland Kitchen ULTRAM 50 MG tablet Take 50 mg by mouth every 6 (six) hours as needed for pain.    Stephen Lynch 40 MG capsule TAKE 4 CAPSULES (160 MG TOTAL) BY MOUTH DAILY. 120 capsule 0   No current facility-administered medications for this visit.     Allergies: No Known Allergies    Physical Exam: Blood pressure (!) 147/74, pulse 92, temperature (!) 96.5 F (35.8 C), temperature source Tympanic, resp. rate 18, height 6' (1.829 m), weight 216 lb 6.4 oz (98.2 kg), SpO2 99 %.    ECOG: 1    General appearance: Alert, awake without any distress. Head: Atraumatic without abnormalities Oropharynx: Without any thrush or ulcers. Eyes: No scleral icterus. Lymph nodes: No lymphadenopathy noted in the cervical, supraclavicular,  or axillary nodes Heart:regular rate and rhythm, without any murmurs or gallops.   Lung: Clear to auscultation without any rhonchi, wheezes or dullness to percussion. Abdomin: Soft, nontender without any shifting dullness or ascites. Musculoskeletal: No clubbing or cyanosis. Neurological: No motor or sensory deficits. Skin: No rashes or lesions.        Lab Results: Lab Results  Component Value Date   WBC 3.1 (L) 08/25/2019   HGB 11.5 (L) 08/25/2019   HCT 35.6 (L) 08/25/2019   MCV 93.2 08/25/2019   PLT 175 08/25/2019     Chemistry      Component Value Date/Time   NA 142 08/25/2019 1242   K 4.4  08/25/2019 1242   CL 106 08/25/2019 1242   CO2 26 08/25/2019 1242   BUN 20 08/25/2019 1242   CREATININE 1.19 08/25/2019 1242      Component Value Date/Time   CALCIUM 9.4 08/25/2019 1242   ALKPHOS 208 (H) 08/25/2019 1242   AST 12 (L) 08/25/2019 1242   ALT 8 08/25/2019 1242   BILITOT 0.2 (L) 08/25/2019 1242       Results for Stephen Lynch, Stephen Lynch (MRN [id number]) as of 10/26/2019 14:48  Ref. Range 05/14/2019 12:44 07/02/2019 14:22 08/25/2019 12:42  Prostate Specific Ag, Serum Latest Ref Range: 0.0 - 4.0 ng/mL 71.3 (H) 3.3 1.0      Impression and Plan:   74 year old man with:  1.    Advanced prostate cancer with disease to the bone diagnosed in February 2021.  He was found to have castration-sensitive disease.   The natural course of his disease was updated and treatment options were reiterated.  His PSA continues to decline currently at 1.0 from the time of diagnosis.  Risks and benefits of continuing Xtandi were discussed today.  Potential complications include nausea, fatigue and hypertension. He is agreeable to continue. Alternative options such as systemic chemotherapy or Zytiga among others will be deferred if he developed castration-resistant disease.  2.  Androgen deprivation: he will receive Eligard in 2 months.  Complications relating flashes and weight gain were reiterated.  3.  Bone directed therapy: Delton See has been deferred for the time being till dental clearance has been completed.  I recommended calcium and vitamin D supplements  4.  Prognosis and goals of care: His disease is incurable although aggressive measures are warranted given his reasonable performance status.  5.  Anemia: Related to malignancy and treatment for his cancer.  Hemoglobin continues to improve.  6.  Bone pain: He does take Percocet intermittently which manages his pain.   7.  Follow-up: He will return in 2 months for repeat evaluation.   30  minutes were spent on this encounter.  The time was  dedicated to reviewing laboratory data, updating his disease status and addressing complications with therapy.  Stephen Button, MD 9/14/20212:47 PM

## 2019-10-27 ENCOUNTER — Telehealth: Payer: Self-pay

## 2019-10-27 LAB — PROSTATE-SPECIFIC AG, SERUM (LABCORP): Prostate Specific Ag, Serum: 0.8 ng/mL (ref 0.0–4.0)

## 2019-10-27 NOTE — Telephone Encounter (Signed)
Gave PSA results per Dr Alen Blew to Ms. Glennon Mac. She verbalized understanding.

## 2019-10-27 NOTE — Telephone Encounter (Signed)
-----   Message from Benjiman Core, MD sent at 10/27/2019  8:45 AM EDT ----- Please let him know his PSA is low

## 2019-10-28 ENCOUNTER — Other Ambulatory Visit: Payer: Self-pay | Admitting: Oncology

## 2019-10-28 DIAGNOSIS — C61 Malignant neoplasm of prostate: Secondary | ICD-10-CM

## 2019-11-01 ENCOUNTER — Other Ambulatory Visit: Payer: Self-pay | Admitting: *Deleted

## 2019-11-01 NOTE — Patient Outreach (Signed)
[facility]) Care Management  11/01/2019  Stephen Lynch 1945-03-29 [id number]   Call placed to member to follow up on management of DM as well as recent visit with PCP and oncology.  He confirms that he attended both appointments, happy to report that his PSA is now 0.8 (was 71.3 in April).  He is still working improving his diabetes management as his last A1C in June was 10.  He has not checked his CBG today, yesterday it was 168.  Denies any hypogycemic/hyperglycemic events.  State he is still working on improving his diet and exercise.  Denies any urgent concerns at this time, encouraged to contact this care manager with questions.  Will follow up within the next month.  Goals      I want to keep getting better with my diabetes (pt-stated)      CARE PLAN ENTRY (see longtitudinal plan of care for additional care plan information)  Objective:   No results found for: HGBA1C Lab Results  Component Value Date   CREATININE 1.19 08/25/2019   CREATININE 1.01 07/02/2019   CREATININE 0.87 05/14/2019     No results found for: EGFR  Current Barriers:   Knowledge Deficits related to basic Diabetes pathophysiology and self care/management  Knowledge Deficits related to medications used for management of diabetes  Case Manager Clinical Goal(s):  Over the next 31 days, patient will demonstrate improved adherence to prescribed treatment plan for diabetes self care/management as evidenced by: decreased A1C to </= 8  UPDATE 11/01/2019 -  current A1C = 10, will continue to work over the next 3 months  Verbalize daily monitoring and recording of CBG within 28 days  Verbalize adherence to prescribed medication regimen within the next 28 days   Interventions:   Discussed plans with patient for ongoing care management follow up and provided patient with direct contact information for care management team  Reviewed scheduled/upcoming provider appointments including: PCP 8/27,  will make endocrinologist appointment  Advised patient, providing education and rationale, to check cbg twice daily and record, calling PCP for findings outside established parameters.    Patient Self Care Activities:   Self administers insulin as prescribed  Attends all scheduled provider appointments  Checks blood sugars as prescribed and utilize hyper and hypoglycemia protocol as needed  UPDATE 11/01/2019 - Patient will continue to work on DM management, diet, and exercise       [name], Therapist, sports, MSN [place] Manager [phone]

## 2019-11-11 DIAGNOSIS — I10 Essential (primary) hypertension: Secondary | ICD-10-CM | POA: Diagnosis not present

## 2019-11-11 DIAGNOSIS — E119 Type 2 diabetes mellitus without complications: Secondary | ICD-10-CM | POA: Diagnosis not present

## 2019-11-11 DIAGNOSIS — E7849 Other hyperlipidemia: Secondary | ICD-10-CM | POA: Diagnosis not present

## 2019-11-11 DIAGNOSIS — M130 Polyarthritis, unspecified: Secondary | ICD-10-CM | POA: Diagnosis not present

## 2019-11-23 ENCOUNTER — Other Ambulatory Visit: Payer: Self-pay | Admitting: Oncology

## 2019-11-23 DIAGNOSIS — C61 Malignant neoplasm of prostate: Secondary | ICD-10-CM

## 2019-11-29 ENCOUNTER — Other Ambulatory Visit: Payer: Self-pay | Admitting: *Deleted

## 2019-11-29 DIAGNOSIS — D500 Iron deficiency anemia secondary to blood loss (chronic): Secondary | ICD-10-CM | POA: Diagnosis not present

## 2019-11-29 DIAGNOSIS — C61 Malignant neoplasm of prostate: Secondary | ICD-10-CM | POA: Diagnosis not present

## 2019-11-29 DIAGNOSIS — R634 Abnormal weight loss: Secondary | ICD-10-CM | POA: Diagnosis not present

## 2019-11-29 DIAGNOSIS — Z86010 Personal history of colonic polyps: Secondary | ICD-10-CM | POA: Diagnosis not present

## 2019-11-29 NOTE — Patient Outreach (Signed)
[facility]) Care Management  11/29/2019  Stephen Lynch 17-Jul-1945 [id number]   Call placed to member to follow up on diabetes management, no answer. HIPAA compliant voice message left, will follow up within the next week.  [name], [place], MSN [place] [phone]

## 2019-12-08 ENCOUNTER — Other Ambulatory Visit: Payer: Self-pay | Admitting: *Deleted

## 2019-12-08 NOTE — Patient Outreach (Addendum)
[facility]) Care Management  12/08/2019  Ezzard Ditmer 05/24/1945 [id number]   Outreach attempt #2, unsuccessful, HIPAA compliant voice message left. Will send outreach letter and follow up within the next 3-4 business days.    [name], [place], MSN [place] [phone]

## 2019-12-09 DIAGNOSIS — C61 Malignant neoplasm of prostate: Secondary | ICD-10-CM | POA: Diagnosis not present

## 2019-12-09 DIAGNOSIS — E785 Hyperlipidemia, unspecified: Secondary | ICD-10-CM | POA: Diagnosis not present

## 2019-12-09 DIAGNOSIS — R635 Abnormal weight gain: Secondary | ICD-10-CM | POA: Diagnosis not present

## 2019-12-09 DIAGNOSIS — I10 Essential (primary) hypertension: Secondary | ICD-10-CM | POA: Diagnosis not present

## 2019-12-09 DIAGNOSIS — E1169 Type 2 diabetes mellitus with other specified complication: Secondary | ICD-10-CM | POA: Diagnosis not present

## 2019-12-09 LAB — LIPID PANEL: LDL Cholesterol: 84

## 2019-12-13 ENCOUNTER — Other Ambulatory Visit: Payer: Self-pay | Admitting: *Deleted

## 2019-12-13 NOTE — Patient Outreach (Signed)
[place] [facility]) Care Management  12/13/2019  Stephen Lynch November 25, 1945 [id number]   Outreach attempt #3, unsuccessful, HIPAA compliant voice message left.   Will make 4th attempt within the next 4 weeks, if remains unsuccessful, will close case due to inability to maintain contact.  Stephen Lynch, [place], MSN [place] [phone]

## 2019-12-14 ENCOUNTER — Other Ambulatory Visit: Payer: Self-pay | Admitting: *Deleted

## 2019-12-14 NOTE — Patient Outreach (Signed)
[facility]) Care Management  12/14/2019  Lester Crickenberger [birth year] [id number]   Incoming call received from member after multiple unsuccessful outreach attempts.  He report he is doing well with continued management of his prostate cancer.  Was given a medication to help wit his prostate and urination during office visit with PCP last week, will follow up again within the next 2 months.  State he did have new A1C drawn, it has now increased again to 12.2 (was down to 10.1 in July).  Denies changing his diet but has not been active either.  Taking Humalog 20 units 3 times a day and Tresiba 22m daily.  Was advised to see endocrinologist, yet to schedule stating the one he was recommended to see was not taking new patients.  Report he and his significant other are looking for a different one, advised on importance of specialist helping to manage condition and decrease A1C.  He verbalizes understanding, denies any urgent concerns.  Agrees to follow up within the next month.  Goals Addressed              This Visit's Progress   .  COMPLETED: I want to keep getting better with my diabetes (pt-stated)        CARE PLAN ENTRY (see longtitudinal plan of care for additional care plan information)  Objective:  . No results found for: HGBA1C Lab Results  Component Value Date   CREATININE 1.19 08/25/2019   CREATININE 1.01 07/02/2019   CREATININE 0.87 05/14/2019 .   .Marland KitchenNo results found for: EGFR  Current Barriers:  .Marland KitchenKnowledge Deficits related to basic Diabetes pathophysiology and self care/management . Knowledge Deficits related to medications used for management of diabetes  Case Manager Clinical Goal(s):  Over the next 31 days, patient will demonstrate improved adherence to prescribed treatment plan for diabetes self care/management as evidenced by: decreased A1C to </= 8  UPDATE 11/01/2019 -  current A1C = 10, will continue to work over the next 3 months . Verbalize daily  monitoring and recording of CBG within 28 days . Verbalize adherence to prescribed medication regimen within the next 28 days   Interventions:  . Discussed plans with patient for ongoing care management follow up and provided patient with direct contact information for care management team . Reviewed scheduled/upcoming provider appointments including: PCP 8/27, will make endocrinologist appointment . Advised patient, providing education and rationale, to check cbg twice daily and record, calling PCP for findings outside established parameters.    Patient Self Care Activities:  . Self administers insulin as prescribed . Attends all scheduled provider appointments . Checks blood sugars as prescribed and utilize hyper and hypoglycemia protocol as needed  UPDATE 11/01/2019 - Patient will continue to work on DM management, diet, and exercise  Resolving due to duplicate goal     .  [facility]- Make and Keep All Appointments        Follow Up Date 12/3   - ask family or friend for a ride - call to cancel if needed - keep a calendar with appointment dates    Why is this important?   Part of staying healthy is seeing the doctor for follow-up care.  If you forget your appointments, there are some things you can do to stay on track.    Notes: Encouraged to call endocrinologist    .  [facility]- Monitor and Manage My Blood Sugar        Follow Up Date 12/3   -  check blood sugar at prescribed times - check blood sugar before and after exercise - take the blood sugar log to all doctor visits - take the blood sugar meter to all doctor visits    Why is this important?   Checking your blood sugar at home helps to keep it from getting very high or very low.  Writing the results in a diary or log helps the doctor know how to care for you.  Your blood sugar log should have the time, date and the results.  Also, write down the amount of insulin or other medicine that you take.  Other information, like what  you ate, exercise done and how you were feeling, will also be helpful.     Notes:     .  [facility] - Set My Target A1C        Follow Up Date 02/26/2020   - set target A1C less than 8    Why is this important?   Your target A1C is decided together by you and your doctor.  It is based on several things like your age and other health issues.    Notes:       [name], RN, MSN [place] [phone]

## 2019-12-17 ENCOUNTER — Other Ambulatory Visit: Payer: Self-pay | Admitting: Oncology

## 2019-12-17 DIAGNOSIS — C61 Malignant neoplasm of prostate: Secondary | ICD-10-CM

## 2019-12-24 ENCOUNTER — Telehealth: Payer: Self-pay | Admitting: Oncology

## 2019-12-24 NOTE — Telephone Encounter (Signed)
Scheduled per 09/04 los, spoke with patient's relative. Patient will be notified of upcoming appointment.

## 2019-12-30 ENCOUNTER — Other Ambulatory Visit: Payer: Self-pay

## 2019-12-30 ENCOUNTER — Inpatient Hospital Stay: Payer: Medicare Other

## 2019-12-30 ENCOUNTER — Inpatient Hospital Stay: Payer: Medicare Other | Attending: Oncology

## 2019-12-30 ENCOUNTER — Inpatient Hospital Stay (HOSPITAL_BASED_OUTPATIENT_CLINIC_OR_DEPARTMENT_OTHER): Payer: Medicare Other | Admitting: Oncology

## 2019-12-30 VITALS — BP 148/80 | HR 55 | Temp 98.2°F | Resp 18 | Ht 72.0 in | Wt 221.8 lb

## 2019-12-30 DIAGNOSIS — Z79899 Other long term (current) drug therapy: Secondary | ICD-10-CM | POA: Insufficient documentation

## 2019-12-30 DIAGNOSIS — C61 Malignant neoplasm of prostate: Secondary | ICD-10-CM

## 2019-12-30 DIAGNOSIS — D630 Anemia in neoplastic disease: Secondary | ICD-10-CM | POA: Diagnosis not present

## 2019-12-30 DIAGNOSIS — Z79818 Long term (current) use of other agents affecting estrogen receptors and estrogen levels: Secondary | ICD-10-CM | POA: Diagnosis not present

## 2019-12-30 DIAGNOSIS — Z191 Hormone sensitive malignancy status: Secondary | ICD-10-CM | POA: Diagnosis not present

## 2019-12-30 DIAGNOSIS — C7951 Secondary malignant neoplasm of bone: Secondary | ICD-10-CM | POA: Insufficient documentation

## 2019-12-30 DIAGNOSIS — Z7982 Long term (current) use of aspirin: Secondary | ICD-10-CM | POA: Diagnosis not present

## 2019-12-30 LAB — CBC WITH DIFFERENTIAL (CANCER CENTER ONLY)
Abs Immature Granulocytes: 0.01 10*3/uL (ref 0.00–0.07)
Basophils Absolute: 0 10*3/uL (ref 0.0–0.1)
Basophils Relative: 1 %
Eosinophils Absolute: 0.1 10*3/uL (ref 0.0–0.5)
Eosinophils Relative: 5 %
HCT: 37 % — ABNORMAL LOW (ref 39.0–52.0)
Hemoglobin: 12.1 g/dL — ABNORMAL LOW (ref 13.0–17.0)
Immature Granulocytes: 0 %
Lymphocytes Relative: 36 %
Lymphs Abs: 1.1 10*3/uL (ref 0.7–4.0)
MCH: 30 pg (ref 26.0–34.0)
MCHC: 32.7 g/dL (ref 30.0–36.0)
MCV: 91.6 fL (ref 80.0–100.0)
Monocytes Absolute: 0.3 10*3/uL (ref 0.1–1.0)
Monocytes Relative: 10 %
Neutro Abs: 1.5 10*3/uL — ABNORMAL LOW (ref 1.7–7.7)
Neutrophils Relative %: 48 %
Platelet Count: 202 10*3/uL (ref 150–400)
RBC: 4.04 MIL/uL — ABNORMAL LOW (ref 4.22–5.81)
RDW: 13.6 % (ref 11.5–15.5)
WBC Count: 3.1 10*3/uL — ABNORMAL LOW (ref 4.0–10.5)
nRBC: 0 % (ref 0.0–0.2)

## 2019-12-30 LAB — CMP (CANCER CENTER ONLY)
ALT: 9 U/L (ref 0–44)
AST: 14 U/L — ABNORMAL LOW (ref 15–41)
Albumin: 3.6 g/dL (ref 3.5–5.0)
Alkaline Phosphatase: 89 U/L (ref 38–126)
Anion gap: 9 (ref 5–15)
BUN: 22 mg/dL (ref 8–23)
CO2: 25 mmol/L (ref 22–32)
Calcium: 9.4 mg/dL (ref 8.9–10.3)
Chloride: 106 mmol/L (ref 98–111)
Creatinine: 1.28 mg/dL — ABNORMAL HIGH (ref 0.61–1.24)
GFR, Estimated: 59 mL/min — ABNORMAL LOW (ref >60)
Glucose, Bld: 268 mg/dL — ABNORMAL HIGH (ref 70–99)
Potassium: 4.4 mmol/L (ref 3.5–5.1)
Sodium: 140 mmol/L (ref 135–145)
Total Bilirubin: 0.3 mg/dL (ref 0.3–1.2)
Total Protein: 7.5 g/dL (ref 6.5–8.1)

## 2019-12-30 MED ORDER — OXYCODONE-ACETAMINOPHEN 7.5-325 MG PO TABS: 1.0000 | ORAL_TABLET | Freq: Four times a day (QID) | ORAL | 0 refills | Status: DC | PRN

## 2019-12-30 MED ORDER — LEUPROLIDE ACETATE (4 MONTH) 30 MG ~~LOC~~ KIT
30.0000 mg | PACK | Freq: Once | SUBCUTANEOUS | Status: AC
  Administered 2019-12-30: 30 mg via SUBCUTANEOUS

## 2019-12-30 MED ORDER — LEUPROLIDE ACETATE (4 MONTH) 30 MG ~~LOC~~ KIT
PACK | SUBCUTANEOUS | Status: AC
  Filled 2019-12-30: qty 30

## 2019-12-30 NOTE — Progress Notes (Signed)
Hematology and Oncology Follow Up Visit  Stephen Lynch [id number] 09-26-1945 74 y.o. 12/30/2019 8:54 AM [name], [name], [name], MD   Principle Diagnosis: 74 year old man with advanced prostate cancer with disease to the bone diagnosed in February 2021.  He was found to have castration-sensitive disease with a PSA 193.   Prior Therapy:  He status post CT-guided biopsy of the right iliac bone.  [name] started on April 20, 2019.  Current therapy:    Eligard 30 mg started in April 2021.  Next Eligard will be given in today and repeated in 4 months.  Xtandi 160 mg daily started in April 2021.   Interim History: Stephen Lynch is here for a follow-up visit.  Since the last visit, he reports no major changes in his health.  He continues to tolerate Xtandi without any major complaints.  He denies any nausea vomiting or abdominal pain.  He denies any worsening edema or bone pain.  He is Percocet use has decreased significantly although he intermittently uses it.  Medications: Reviewed without changes. Current Outpatient Medications  Medication Sig Dispense Refill  . acetaminophen (TYLENOL) 500 MG tablet Take 500 mg by mouth every 6 (six) hours as needed for mild pain.    . ASPIRIN 81 PO Take 81 mg by mouth daily.     . Calcium-Magnesium-Zinc 333-133-5 MG TABS Take 1 tablet by mouth daily.    Marland Kitchen doxycycline (VIBRA-TABS) 100 MG tablet     . DULoxetine (CYMBALTA) 60 MG capsule Take 60 mg by mouth daily.    Marland Kitchen ibuprofen (ADVIL) 200 MG tablet Take 200 mg by mouth every 6 (six) hours as needed for moderate pain.    Marland Kitchen insulin lispro (HUMALOG) 100 UNIT/ML KwikPen Junior Inject 0.2 mLs (20 Units total) into the skin 3 (three) times daily after meals. (Patient taking differently: Inject 7-25 Units into the skin 3 (three) times daily after meals. Per sliding scale) 3 mL 6  . lidocaine (LIDODERM) 5 % Place 2 patches onto the skin daily. Remove & Discard patch within 12 hours or as directed by MD 30  patch 0  . Menthol-Camphor (TIGER BALM ARTHRITIS RUB EX) Apply 1 application topically 2 (two) times daily as needed (back pain).    . ondansetron (ZOFRAN) 4 MG tablet Take 1 tablet (4 mg total) by mouth every 6 (six) hours as needed for nausea. 20 tablet 0  . oxyCODONE-acetaminophen (PERCOCET) 7.5-325 MG tablet Take 1 tablet by mouth 4 (four) times daily as needed. 60 tablet 0  . rosuvastatin (CRESTOR) 10 MG tablet Take 1 tablet (10 mg total) by mouth daily. 90 tablet 3  . TRESIBA FLEXTOUCH 100 UNIT/ML SOPN FlexTouch Pen Inject 10 Units into the skin at bedtime.     Marland Kitchen ULTRAM 50 MG tablet Take 50 mg by mouth every 6 (six) hours as needed for pain.    Gillermina Phy 40 MG capsule TAKE 4 CAPSULES BY MOUTH DAILY 120 capsule 0   No current facility-administered medications for this visit.     Allergies: No Known Allergies    Physical Exam:  Blood pressure (!) 148/80, pulse (!) 55, temperature 98.2 F (36.8 C), temperature source Oral, resp. rate 18, height 6' (1.829 m), weight 221 lb 12.8 oz (100.6 kg), SpO2 100 %.    ECOG: 1     General appearance: Comfortable appearing without any discomfort Head: Normocephalic without any trauma Oropharynx: Mucous membranes are moist and pink without any thrush or ulcers. Eyes: Pupils are equal and round reactive  to light. Lymph nodes: No cervical, supraclavicular, inguinal or axillary lymphadenopathy.   Heart:regular rate and rhythm.  S1 and S2 without leg edema. Lung: Clear without any rhonchi or wheezes.  No dullness to percussion. Abdomin: Soft, nontender, nondistended with good bowel sounds.  No hepatosplenomegaly. Musculoskeletal: No joint deformity or effusion.  Full range of motion noted. Neurological: No deficits noted on motor, sensory and deep tendon reflex exam. Skin: No petechial rash or dryness.  Appeared moist.          Lab Results: Lab Results  Component Value Date   WBC 5.0 10/26/2019   HGB 12.1 (L) 10/26/2019   HCT 37.3  (L) 10/26/2019   MCV 90.5 10/26/2019   PLT 206 10/26/2019     Chemistry      Component Value Date/Time   NA 142 10/26/2019 1440   K 3.9 10/26/2019 1440   CL 104 10/26/2019 1440   CO2 30 10/26/2019 1440   BUN 24 (H) 10/26/2019 1440   CREATININE 1.22 10/26/2019 1440      Component Value Date/Time   CALCIUM 9.8 10/26/2019 1440   ALKPHOS 119 10/26/2019 1440   AST 12 (L) 10/26/2019 1440   ALT 7 10/26/2019 1440   BILITOT 0.3 10/26/2019 1440       Results for Stephen Lynch, Stephen Lynch (MRN [id number]) as of 12/30/2019 08:55  Ref. Range 08/25/2019 12:42 10/26/2019 14:40  Prostate Specific Ag, Serum Latest Ref Range: 0.0 - 4.0 ng/mL 1.0 0.8       Impression and Plan:   74 year old man with:  1.    Castration-sensitive advanced prostate cancer with disease to the bone diagnosed in February 2021.     He continues to tolerate Xtandi with excellent PSA response that is currently down to 0.8.  Risks and benefits of continuing this treatment long-term were reviewed.  Potential complications that include hypertension and rarely seizures were reviewed.  Alternative options will be deferred unless he has castration-resistant disease.  He is agreeable to continue.  2.  Androgen deprivation: He is currently on Eligard which she will receive today and repeated in 4 months.  Complications including weight gain and hot flashes were reiterated.  3.  Bone directed therapy: I recommended calcium and vitamin D supplements.  Delton See will be used once he has obtained dental clearance.   4.  Prognosis and goals of care: Therapy is palliative at this time although aggressive measures are warranted.  5.  Anemia: Continues to improve with his cancer treatment which indicates anemia related to malignancy.  6.  Bone pain: Manageable with Percocet which will be refilled for him.   7.  Follow-up: In 4 months for a repeat follow-up.   30  minutes were dedicated to this visit.  The time was spent on reviewing  his disease status, discussing treatment options and future plan of care review.  Zola Button, MD 11/18/20218:54 AM

## 2019-12-31 ENCOUNTER — Telehealth: Payer: Self-pay | Admitting: *Deleted

## 2019-12-31 LAB — PROSTATE-SPECIFIC AG, SERUM (LABCORP): Prostate Specific Ag, Serum: 0.4 ng/mL (ref 0.0–4.0)

## 2019-12-31 NOTE — Telephone Encounter (Signed)
Notified of message below

## 2019-12-31 NOTE — Telephone Encounter (Signed)
-----   Message from [name], MD sent at 12/31/2019  9:18 AM EST ----- Please let him know his PSA is down

## 2020-01-10 ENCOUNTER — Ambulatory Visit: Payer: Self-pay | Admitting: *Deleted

## 2020-01-14 ENCOUNTER — Other Ambulatory Visit: Payer: Self-pay | Admitting: *Deleted

## 2020-01-14 NOTE — Patient Outreach (Signed)
[place] [facility]) Care Management  01/14/2020  Tysin Salada 1945-08-16 [id number]   Outgoing call placed to member, he state he is driving and will call this care manager back once he is back home.  Will await call back, if no call back will follow up within the next 3-4 business days.  [name], [place], MSN [place] [phone]

## 2020-01-18 ENCOUNTER — Other Ambulatory Visit: Payer: Self-pay | Admitting: *Deleted

## 2020-01-18 DIAGNOSIS — C61 Malignant neoplasm of prostate: Secondary | ICD-10-CM

## 2020-01-18 MED ORDER — XTANDI 40 MG PO CAPS: 160.0000 mg | ORAL_CAPSULE | Freq: Every day | ORAL | 0 refills | Status: DC

## 2020-01-20 ENCOUNTER — Other Ambulatory Visit: Payer: Self-pay | Admitting: *Deleted

## 2020-01-20 NOTE — Patient Outreach (Signed)
[facility]) Care Management  01/20/2020  Stephen Lynch Apr 21, 1945 [id number]   Outreach attempt #2, successful.  Member state he has continued to manage chronic conditions well.  Report blood sugars are "ok" sometimes elevated.  Today's reading was 150.  He still has not scheduled appointment with new endocrinologist.  State his significant other has written down 3 options, this care manager offered to contact offices for him to schedule appointment.  State he does not have access to list currently, but will have significant other call.  Also report he missed his last visit with PCP, calling today to reschedule.  Discussed ways to maintain adherence to diet during holidays, verbalized understanding.  Denies any urgent concerns, encouraged to contact this care manager with questions.  Agrees to follow up within the next month.  Goals Addressed            This Visit's Progress   . [facility] - Make and Keep All Appointments   Not on track    Follow Up Date 02/20/2020   - ask family or friend for a ride - call to cancel if needed - keep a calendar with appointment dates    Why is this important?   Part of staying healthy is seeing the doctor for follow-up care.  If you forget your appointments, there are some things you can do to stay on track.    Notes: Encouraged to call endocrinologist  12/9 - missed PCP appointment, advised of importance of calling to reschedule    . [facility] - Monitor and Manage My Blood Sugar   On track    Follow Up Date 02/20/2020   - check blood sugar at prescribed times - check blood sugar before and after exercise - take the blood sugar log to all doctor visits - take the blood sugar meter to all doctor visits    Why is this important?   Checking your blood sugar at home helps to keep it from getting very high or very low.  Writing the results in a diary or log helps the doctor know how to care for you.  Your blood sugar log should have the time,  date and the results.  Also, write down the amount of insulin or other medicine that you take.  Other information, like what you ate, exercise done and how you were feeling, will also be helpful.     Notes:   12/9 - Reminded of diabetic diet and encouraged to continue medication adherence    . [facility] - Set My Target A1C   On track    Follow Up Date 02/26/2020   - set target A1C less than 8    Why is this important?   Your target A1C is decided together by you and your doctor.  It is based on several things like your age and other health issues.    Notes:   12/9 - Encouraged again to contact new endocrinologist to schedule appointment      [name], RN, MSN [place] [phone]

## 2020-01-24 DIAGNOSIS — Z23 Encounter for immunization: Secondary | ICD-10-CM | POA: Diagnosis not present

## 2020-01-24 DIAGNOSIS — E1169 Type 2 diabetes mellitus with other specified complication: Secondary | ICD-10-CM | POA: Diagnosis not present

## 2020-01-24 DIAGNOSIS — I10 Essential (primary) hypertension: Secondary | ICD-10-CM | POA: Diagnosis not present

## 2020-02-10 ENCOUNTER — Other Ambulatory Visit: Payer: Self-pay | Admitting: Oncology

## 2020-02-10 DIAGNOSIS — C61 Malignant neoplasm of prostate: Secondary | ICD-10-CM

## 2020-02-17 ENCOUNTER — Other Ambulatory Visit: Payer: Self-pay | Admitting: *Deleted

## 2020-02-17 NOTE — Patient Outreach (Signed)
[facility]) Care Management  02/17/2020  Stephen Lynch Aug 14, 1945 [id number]   Call placed to member, no answer, HIPAA compliant voice message left.  Will follow up within the next 3-4 business days.  [name], [place], MSN [facility] Care Management  [facility] Manager [phone]

## 2020-02-23 ENCOUNTER — Other Ambulatory Visit: Payer: Self-pay | Admitting: *Deleted

## 2020-02-23 NOTE — Patient Outreach (Signed)
[place] [facility]) Care Management  [place]  02/23/2020   Stephen Lynch 07/16/1945 [id number]    Outreach attempt #2, unsuccessful, HIPAA compliant voice message left.  Will send outreach letter and follow up within the next business days.  [name], [place], MSN [place] [phone]

## 2020-02-29 ENCOUNTER — Other Ambulatory Visit: Payer: Self-pay | Admitting: *Deleted

## 2020-02-29 NOTE — Patient Outreach (Signed)
[place] [facility]) Care Management  02/29/2020  Redell Nazir Nov 27, 1945 [id number]   Outreach attempt #3, unsuccessful, unable to leave message as mailbox is full.  Will make 4th and final attempt within the next 4 weeks, if remain unsuccessful will close case due to inability to maintain contact.  [name], [place], MSN [place] [phone]

## 2020-03-28 ENCOUNTER — Other Ambulatory Visit: Payer: Self-pay | Admitting: *Deleted

## 2020-03-28 NOTE — Patient Outreach (Signed)
[facility]) Care Management  [place]  03/28/2020   Stephen Lynch 26-Mar-1945 [id number]   Outreach attempt #4, successful.  Member report he is doing well, state he was started on Tradjenta, unable to state when this was started.  State since starting, his blood sugars have been better controlled, ranging 100's - 160's.  He is still taking [place] and Humalog daily as instructed. Last A1C was done and October resulting 12.2, no new readings since.  Still has not made appointment with endocrinologist, report he lost the list of options that was provided.    Call placed to PCP office to inquire about last appointment and next appointment.  Was last seen on 12/13, next visit scheduled for 6/13.  This care manager inquired about obtaining another A1C and a new referral to endocrinologist, receptionist will have nurse call this care manager back.  Patient denies any urgent concerns, encouraged to contact this care manager with questions.    Encounter Medications:  Outpatient Encounter Medications as of 03/28/2020  Medication Sig  . acetaminophen (TYLENOL) 500 MG tablet Take 500 mg by mouth every 6 (six) hours as needed for mild pain.  . ASPIRIN 81 PO Take 81 mg by mouth daily.   . Calcium-Magnesium-Zinc 333-133-5 MG TABS Take 1 tablet by mouth daily.  Marland Kitchen doxycycline (VIBRA-TABS) 100 MG tablet   . DULoxetine (CYMBALTA) 60 MG capsule Take 60 mg by mouth daily.  Marland Kitchen ibuprofen (ADVIL) 200 MG tablet Take 200 mg by mouth every 6 (six) hours as needed for moderate pain.  Marland Kitchen insulin lispro (HUMALOG) 100 UNIT/ML KwikPen Junior Inject 0.2 mLs (20 Units total) into the skin 3 (three) times daily after meals. (Patient taking differently: Inject 7-25 Units into the skin 3 (three) times daily after meals. Per sliding scale)  . lidocaine (LIDODERM) 5 % Place 2 patches onto the skin daily. Remove & Discard patch within 12 hours or as directed by MD  . Menthol-Camphor (TIGER BALM ARTHRITIS  RUB EX) Apply 1 application topically 2 (two) times daily as needed (back pain).  . ondansetron (ZOFRAN) 4 MG tablet Take 1 tablet (4 mg total) by mouth every 6 (six) hours as needed for nausea.  Marland Kitchen oxyCODONE-acetaminophen (PERCOCET) 7.5-325 MG tablet Take 1 tablet by mouth 4 (four) times daily as needed.  . rosuvastatin (CRESTOR) 10 MG tablet Take 1 tablet (10 mg total) by mouth daily.  . TRESIBA FLEXTOUCH 100 UNIT/ML SOPN FlexTouch Pen Inject 10 Units into the skin at bedtime.   Marland Kitchen ULTRAM 50 MG tablet Take 50 mg by mouth every 6 (six) hours as needed for pain.  Gillermina Phy 40 MG capsule TAKE 4 CAPSULES BY MOUTH ONCE DAILY AT THE SAME TIME. MAY TAKE WITH OR WITHOUT FOOD. SWALLOW WHOLE.   No facility-administered encounter medications on file as of 03/28/2020.    Functional Status:  In your present state of health, do you have any difficulty performing the following activities: 04/22/2019 04/01/2019  Hearing? N N  Vision? N N  Difficulty concentrating or making decisions? N Y  Comment - Possible metastatic disease  Walking or climbing stairs? N Y  Comment - uses DME  Dressing or bathing? N N  Doing errands, shopping? - Y  Comment - depends on sig other  Preparing Food and eating ? - Y  Comment - Has sig other to help  Using the Toilet? - N  In the past six months, have you accidently leaked urine? - N  Do you have  problems with loss of bowel control? - N  Managing your Medications? - Y  Comment - sig other helps  Managing your Finances? - Y  Comment - depends on sig other  Housekeeping or managing your Housekeeping? - Y  Comment - depends on sig other  Some recent data might be hidden    Fall/Depression Screening: Fall Risk  03/28/2020 04/01/2019  Falls in the past year? 0 0  Number falls in past yr: 0 0  Injury with Fall? 0 0  Risk for fall due to : - No Fall Risks  Follow up - Falls prevention discussed   PHQ 2/9 Scores 04/01/2019  PHQ - 2 Score 0    Assessment:  Goals  Addressed            This Visit's Progress   . [facility] - Make and Keep All Appointments   On track    Follow Up Date 3/15  Timeframe:  Short-Term Goal Priority:  Medium Start Date:      2/15                       Expected End Date:   3/15                      - ask family or friend for a ride - call to cancel if needed - keep a calendar with appointment dates    Why is this important?   Part of staying healthy is seeing the doctor for follow-up care.  If you forget your appointments, there are some things you can do to stay on track.    Notes: Encouraged to call endocrinologist  12/9 - missed PCP appointment, advised of importance of calling to reschedule  2/15 - Call placed to PCP office to inquire about next office visit    . [facility] - Monitor and Manage My Blood Sugar   On track    Follow Up Date 3/15  Timeframe:  Short-Term Goal Priority:  High Start Date:  2/15                           Expected End Date:  3/15                       - check blood sugar at prescribed times - check blood sugar before and after exercise - take the blood sugar log to all doctor visits - take the blood sugar meter to all doctor visits    Why is this important?   Checking your blood sugar at home helps to keep it from getting very high or very low.  Writing the results in a diary or log helps the doctor know how to care for you.  Your blood sugar log should have the time, date and the results.  Also, write down the amount of insulin or other medicine that you take.  Other information, like what you ate, exercise done and how you were feeling, will also be helpful.     Notes:   12/9 - Reminded of diabetic diet and encouraged to continue medication adherence  2/15 - Reviewed medication changes and importance of checking blood sugars to decrease risk of hypoglycemic episodes    . [facility] - Set My Target A1C   On track    Follow Up Date 3/15  Timeframe:  Long-Range Goal Priority:   Medium Start Date:  2/15                      Expected End Date:  5/15                       - set target A1C less than 8    Why is this important?   Your target A1C is decided together by you and your doctor.  It is based on several things like your age and other health issues.    Notes:   12/9 - Encouraged again to contact new endocrinologist to schedule appointment  2/15 - Call place to PCP office to request A1C       Plan:  Follow-up:  Patient agrees to Care Plan and Follow-up.  Will follow up with PCP office within the next week if no call back. Will follow up with member within the next month.  [name], [place], MSN [place] [phone]

## 2020-04-03 ENCOUNTER — Telehealth: Payer: Self-pay | Admitting: Oncology

## 2020-04-03 ENCOUNTER — Other Ambulatory Visit: Payer: Self-pay | Admitting: *Deleted

## 2020-04-03 NOTE — Patient Outreach (Signed)
[place] [facility]) Care Management  04/03/2020  Stephen Lynch 10-20-1945 [id number]   Call placed to PCP office to inquire about new endocrinology referral since the MD with the last referral is no longer accepting new patients.  New referral will be sent.  Also inquired about need for repeat A1C as last one was in October and next office visit isn't until June.  Notified that member may come in at any time to have labs drawn. Will notify member and follow up with him within the next month regarding management of DM as planned.  Stephen Lynch, [place], MSN [place] [phone]

## 2020-04-03 NOTE — Telephone Encounter (Signed)
Called patient regarding upcoming March appointment, spoke with patient's relative. Patient will be notified of upcoming appointment.

## 2020-04-17 ENCOUNTER — Other Ambulatory Visit: Payer: Self-pay | Admitting: Oncology

## 2020-04-17 DIAGNOSIS — C61 Malignant neoplasm of prostate: Secondary | ICD-10-CM

## 2020-04-20 ENCOUNTER — Telehealth: Payer: Self-pay

## 2020-04-20 NOTE — Telephone Encounter (Signed)
Received call from patient complaining of weakness, decreased appetite and nausea. Denies fever, cough, pain, vomiting diarrhea, or constipation.  Per Dr. Alen Blew: please tell him to stop XTANDI till his appointment next week.  Patient informed and he was agreeable and verbalized understanding.

## 2020-04-22 ENCOUNTER — Other Ambulatory Visit: Payer: Self-pay | Admitting: Oncology

## 2020-04-25 ENCOUNTER — Other Ambulatory Visit: Payer: Self-pay | Admitting: *Deleted

## 2020-04-25 NOTE — Patient Outreach (Signed)
[place] [facility]) Care Management  04/25/2020  Stephen Lynch [birth year] [id number]   Outgoing call placed to member, no answer, HIPAA compliant voice message left.  Will follow up within the next 3-4 business days.  [name], [place], MSN [place] [phone]

## 2020-04-27 ENCOUNTER — Other Ambulatory Visit: Payer: Self-pay

## 2020-04-27 ENCOUNTER — Inpatient Hospital Stay (HOSPITAL_BASED_OUTPATIENT_CLINIC_OR_DEPARTMENT_OTHER): Payer: Medicare Other | Admitting: Oncology

## 2020-04-27 ENCOUNTER — Inpatient Hospital Stay: Payer: Medicare Other | Attending: Oncology

## 2020-04-27 ENCOUNTER — Inpatient Hospital Stay: Payer: Medicare Other

## 2020-04-27 VITALS — BP 127/55 | HR 98 | Temp 97.8°F | Resp 14 | Ht 72.0 in | Wt 224.3 lb

## 2020-04-27 DIAGNOSIS — Z79899 Other long term (current) drug therapy: Secondary | ICD-10-CM | POA: Insufficient documentation

## 2020-04-27 DIAGNOSIS — R5383 Other fatigue: Secondary | ICD-10-CM | POA: Diagnosis not present

## 2020-04-27 DIAGNOSIS — R232 Flushing: Secondary | ICD-10-CM | POA: Diagnosis not present

## 2020-04-27 DIAGNOSIS — Z794 Long term (current) use of insulin: Secondary | ICD-10-CM | POA: Diagnosis not present

## 2020-04-27 DIAGNOSIS — C61 Malignant neoplasm of prostate: Secondary | ICD-10-CM | POA: Diagnosis not present

## 2020-04-27 DIAGNOSIS — Z191 Hormone sensitive malignancy status: Secondary | ICD-10-CM | POA: Diagnosis not present

## 2020-04-27 DIAGNOSIS — C7951 Secondary malignant neoplasm of bone: Secondary | ICD-10-CM | POA: Diagnosis not present

## 2020-04-27 DIAGNOSIS — R110 Nausea: Secondary | ICD-10-CM | POA: Diagnosis not present

## 2020-04-27 DIAGNOSIS — Z7982 Long term (current) use of aspirin: Secondary | ICD-10-CM | POA: Diagnosis not present

## 2020-04-27 DIAGNOSIS — D630 Anemia in neoplastic disease: Secondary | ICD-10-CM | POA: Insufficient documentation

## 2020-04-27 LAB — CMP (CANCER CENTER ONLY)
ALT: 54 U/L — ABNORMAL HIGH (ref 0–44)
AST: 13 U/L — ABNORMAL LOW (ref 15–41)
Albumin: 3 g/dL — ABNORMAL LOW (ref 3.5–5.0)
Alkaline Phosphatase: 308 U/L — ABNORMAL HIGH (ref 38–126)
Anion gap: 9 (ref 5–15)
BUN: 18 mg/dL (ref 8–23)
CO2: 30 mmol/L (ref 22–32)
Calcium: 9.7 mg/dL (ref 8.9–10.3)
Chloride: 97 mmol/L — ABNORMAL LOW (ref 98–111)
Creatinine: 1.16 mg/dL (ref 0.61–1.24)
GFR, Estimated: >60 mL/min (ref >60)
Glucose, Bld: 323 mg/dL — ABNORMAL HIGH (ref 70–99)
Potassium: 4.4 mmol/L (ref 3.5–5.1)
Sodium: 136 mmol/L (ref 135–145)
Total Bilirubin: 0.7 mg/dL (ref 0.3–1.2)
Total Protein: 8.2 g/dL — ABNORMAL HIGH (ref 6.5–8.1)

## 2020-04-27 LAB — CBC WITH DIFFERENTIAL (CANCER CENTER ONLY)
Abs Immature Granulocytes: 0.1 10*3/uL — ABNORMAL HIGH (ref 0.00–0.07)
Basophils Absolute: 0 10*3/uL (ref 0.0–0.1)
Basophils Relative: 0 %
Eosinophils Absolute: 0.1 10*3/uL (ref 0.0–0.5)
Eosinophils Relative: 2 %
HCT: 35.7 % — ABNORMAL LOW (ref 39.0–52.0)
Hemoglobin: 11.6 g/dL — ABNORMAL LOW (ref 13.0–17.0)
Immature Granulocytes: 1 %
Lymphocytes Relative: 17 %
Lymphs Abs: 1.3 10*3/uL (ref 0.7–4.0)
MCH: 29.6 pg (ref 26.0–34.0)
MCHC: 32.5 g/dL (ref 30.0–36.0)
MCV: 91.1 fL (ref 80.0–100.0)
Monocytes Absolute: 0.6 10*3/uL (ref 0.1–1.0)
Monocytes Relative: 8 %
Neutro Abs: 5.3 10*3/uL (ref 1.7–7.7)
Neutrophils Relative %: 72 %
Platelet Count: 314 10*3/uL (ref 150–400)
RBC: 3.92 MIL/uL — ABNORMAL LOW (ref 4.22–5.81)
RDW: 12.4 % (ref 11.5–15.5)
WBC Count: 7.4 10*3/uL (ref 4.0–10.5)
nRBC: 0 % (ref 0.0–0.2)

## 2020-04-27 MED ORDER — LEUPROLIDE ACETATE (4 MONTH) 30 MG ~~LOC~~ KIT
PACK | SUBCUTANEOUS | Status: AC
  Filled 2020-04-27: qty 30

## 2020-04-27 MED ORDER — LEUPROLIDE ACETATE (4 MONTH) 30 MG ~~LOC~~ KIT
30.0000 mg | PACK | Freq: Once | SUBCUTANEOUS | Status: AC
  Administered 2020-04-27: 30 mg via SUBCUTANEOUS

## 2020-04-27 NOTE — Patient Instructions (Signed)

## 2020-04-27 NOTE — Progress Notes (Signed)
Hematology and Oncology Follow Up Visit  Stephen Lynch [id number] 1945/12/06 75 y.o. 04/27/2020 9:47 AM Stephen Lynch, [name], Stephen Rude, MD   Principle Diagnosis: 60 year old man with castration-sensitive advanced prostate cancer with disease to the bone and a PSA of 193 diagnosed in February 2021.     Prior Therapy:  He status post CT-guided biopsy of the right iliac bone.  [name] started on April 20, 2019.  Current therapy:    Eligard 30 mg started in April 2021.  He will receive Eligard today and repeated in 4 months.  Xtandi 160 mg daily started in April 2021.   Interim History: Stephen Lynch presents today for return evaluation.  Since the last visit, he has reports of feeling reasonably well without any major complaints.  Last week he started developing symptoms of fatigue, tiredness nausea and health Xtandi up till today.  Symptoms improved after holding the medication at this time.  He is eating better his weight is is up.  He denies any nausea vomiting or diarrhea.  He is eating well the last few days.  His bone pain is manageable predominantly by Percocet 1 to 2 tablets a day.  His performance status quality of life remains maintained.  Medications: Updated on review. Current Outpatient Medications  Medication Sig Dispense Refill  . acetaminophen (TYLENOL) 500 MG tablet Take 500 mg by mouth every 6 (six) hours as needed for mild pain.    . ASPIRIN 81 PO Take 81 mg by mouth daily.     . Calcium-Magnesium-Zinc 333-133-5 MG TABS Take 1 tablet by mouth daily.    Marland Kitchen doxycycline (VIBRA-TABS) 100 MG tablet     . DULoxetine (CYMBALTA) 60 MG capsule Take 60 mg by mouth daily.    Marland Kitchen ibuprofen (ADVIL) 200 MG tablet Take 200 mg by mouth every 6 (six) hours as needed for moderate pain.    Marland Kitchen insulin lispro (HUMALOG) 100 UNIT/ML KwikPen Junior Inject 0.2 mLs (20 Units total) into the skin 3 (three) times daily after meals. (Patient taking differently: Inject 7-25 Units into the skin 3 (three)  times daily after meals. Per sliding scale) 3 mL 6  . lidocaine (LIDODERM) 5 % Place 2 patches onto the skin daily. Remove & Discard patch within 12 hours or as directed by MD 30 patch 0  . Menthol-Camphor (TIGER BALM ARTHRITIS RUB EX) Apply 1 application topically 2 (two) times daily as needed (back pain).    . ondansetron (ZOFRAN) 4 MG tablet TAKE 1 TABLET BY MOUTH EVERY 6 HOURS AS NEEDED FOR NAUSEA 20 tablet 0  . oxyCODONE-acetaminophen (PERCOCET) 7.5-325 MG tablet Take 1 tablet by mouth 4 (four) times daily as needed. 60 tablet 0  . rosuvastatin (CRESTOR) 10 MG tablet Take 1 tablet (10 mg total) by mouth daily. 90 tablet 3  . TRESIBA FLEXTOUCH 100 UNIT/ML SOPN FlexTouch Pen Inject 10 Units into the skin at bedtime.     Marland Kitchen ULTRAM 50 MG tablet Take 50 mg by mouth every 6 (six) hours as needed for pain.    Gillermina Phy 40 MG capsule TAKE 4 CAPSULES BY MOUTH ONCE DAILY AT THE SAME TIME. MAY TAKE WITH OR WITHOUT FOOD. SWALLOW WHOLE. 120 capsule 0   No current facility-administered medications for this visit.     Allergies: No Known Allergies    Physical Exam:   Blood pressure (!) 127/55, pulse 98, temperature 97.8 F (36.6 C), temperature source Tympanic, resp. rate 14, height 6' (1.829 m), weight 224 lb 4.8 oz (101.7 kg), SpO2  100 %.   ECOG: 1     General appearance: Alert, awake without any distress. Head: Atraumatic without abnormalities Oropharynx: Without any thrush or ulcers. Eyes: No scleral icterus. Lymph nodes: No lymphadenopathy noted in the cervical, supraclavicular, or axillary nodes Heart:regular rate and rhythm, without any murmurs or gallops.   Lung: Clear to auscultation without any rhonchi, wheezes or dullness to percussion. Abdomin: Soft, nontender without any shifting dullness or ascites. Musculoskeletal: No clubbing or cyanosis. Neurological: No motor or sensory deficits. Skin: No rashes or lesions.           Lab Results: Lab Results  Component Value  Date   WBC 3.1 (L) 12/30/2019   HGB 12.1 (L) 12/30/2019   HCT 37.0 (L) 12/30/2019   MCV 91.6 12/30/2019   PLT 202 12/30/2019     Chemistry      Component Value Date/Time   NA 140 12/30/2019 0859   K 4.4 12/30/2019 0859   CL 106 12/30/2019 0859   CO2 25 12/30/2019 0859   BUN 22 12/30/2019 0859   CREATININE 1.28 (H) 12/30/2019 0859      Component Value Date/Time   CALCIUM 9.4 12/30/2019 0859   ALKPHOS 89 12/30/2019 0859   AST 14 (L) 12/30/2019 0859   ALT 9 12/30/2019 0859   BILITOT 0.3 12/30/2019 0859        Results for Stephen Lynch, Stephen Lynch (MRN [id number]) as of 04/27/2020 09:17  Ref. Range 12/30/2019 08:59  Prostate Specific Ag, Serum Latest Ref Range: 0.0 - 4.0 ng/mL 0.4       Impression and Plan:   75 year old man with:  1.    Advanced prostate cancer diagnosed in February 2021.  He was found to have castration-sensitive disease to the bone.     His disease status was updated at this time including review of his laboratory results.  His PSA continues to show excellent response and approaching undetectable level.  Risks and benefits of continuing this treatment were discussed at this time.  Alternative treatment options including a dose reduction of Xtandi, dose discontinuation altogether and use of Zytiga or systemic chemotherapy.  After discussion today, if his PSA continues to be low I will instruct him to resume Xtandi at a lower dose of 80 mg daily.  His PSA is rising, we will switch to a different therapy after updating his staging scans.  2.  Androgen deprivation: He will receive Eligard today and repeated in 4 months.  Complications occluding weight gain, hot flashes.  3.  Bone directed therapy: I recommended continuing calcium and vitamin D supplements.  Upon completing dental clearance they will consider Xgeva.   4.  Prognosis and goals of care: Therapy remains palliative at this time although aggressive measures are warranted given his reasonable performance  status.  5.  Anemia: Related to his malignancy prostate cancer treatments.  Hemoglobin remains adequate at this time and overall stable.  6.  Bone pain: Manageable at this time with the Percocet which will be refilled for him.  7.  Nausea: Zofran is available to him which will be refilled.   8.  Follow-up: In 2 months for repeat follow-up.   30  minutes were spent on this encounter.  Time was dedicated to reviewing his laboratory data, disease status update and addressing complications related to his cancer and cancer therapy.   Zola Button, MD 3/17/20229:47 AM

## 2020-04-28 ENCOUNTER — Other Ambulatory Visit: Payer: Self-pay | Admitting: Oncology

## 2020-04-28 ENCOUNTER — Telehealth: Payer: Self-pay

## 2020-04-28 ENCOUNTER — Telehealth: Payer: Self-pay | Admitting: Pharmacist

## 2020-04-28 DIAGNOSIS — C61 Malignant neoplasm of prostate: Secondary | ICD-10-CM

## 2020-04-28 LAB — PROSTATE-SPECIFIC AG, SERUM (LABCORP): Prostate Specific Ag, Serum: 24.4 ng/mL — ABNORMAL HIGH (ref 0.0–4.0)

## 2020-04-28 MED ORDER — ABIRATERONE ACETATE 250 MG PO TABS: 1000.0000 mg | ORAL_TABLET | Freq: Every day | ORAL | 0 refills | Status: DC

## 2020-04-28 MED ORDER — PREDNISONE 5 MG PO TABS: 5.0000 mg | ORAL_TABLET | Freq: Every day | ORAL | 3 refills | Status: DC

## 2020-04-28 NOTE — Telephone Encounter (Signed)
Oral Oncology Pharmacist Encounter  Received new prescription for Zytiga (abiraterone) for the treatment of metastatic castrate-resistant prostate cancer in conjunction with prednisone and ADT, planned duration until disease progression or unacceptable drug toxicity.  Prescription dose and frequency assessed for appropriateness. Appropriate for therapy initiation.   CBC w/ Diff and CMP from 04/27/20 assessed, noted glucose of 323 mg/dL (noted pt with hxo diabetes, on insulin) - recommend closely monitoring while on Zytiga due to risk of hyperglycemia with medication. ALT up from previous labs (54 U/L, previously 9 U/L) - no hepatic dose adjustments required at this time.   Current medication list in Epic reviewed, DDIs with Zytiga identified:  Category C DDI between Zytiga and Tramadol - Zytiga may decrease serum concentrations of tramadol through CYP2D6 inhibition. No change in therapy warranted at this time, monitor for decreased efficacy of tramadol.   Evaluated chart and no patient barriers to medication adherence noted.   Patient agreement for Zytiga treatment documented in MD note on 04/28/20.  Prescription has been e-scribed to the [facility] for benefits analysis and approval.  Oral Oncology Clinic will continue to follow for insurance authorization, copayment issues, initial counseling and start date.  Leron Croak, PharmD, BCPS Hematology/Oncology Clinical Pharmacist [facility] [phone] 04/28/2020 11:34 AM

## 2020-04-28 NOTE — Progress Notes (Signed)
Laboratory data were reviewed today and discussed with the patient via phone including his PSA and alkaline phosphatase.  Both showed elevation which indicate that showed disease progression.  I instructed him to stop Xtandi and we will stage him with a CT scan in the immediate future.  Treatment options at this time as a salvage including the Zytiga versus systemic chemotherapy among other options.  After discussion today, we elected to proceed with Zytiga at 1000 mg daily with prednisone.  Complication clinic hypertension, hypokalemia adrenal sufficiency among others were reviewed.  He is agreeable to proceed and we will law assess his response in the next 6 weeks.

## 2020-04-28 NOTE — Telephone Encounter (Signed)
Oral Oncology Patient Advocate Encounter  Received notification from [place] that prior authorization for Stephen Lynch is required.  PA submitted on CoverMyMeds Key [id number] Status is pending  Oral Oncology Clinic will continue to follow.  [place] Patient [name] Phone [phone] Fax [phone] 04/28/2020 11:30 AM

## 2020-05-01 ENCOUNTER — Other Ambulatory Visit: Payer: Self-pay | Admitting: *Deleted

## 2020-05-01 ENCOUNTER — Other Ambulatory Visit: Payer: Self-pay | Admitting: Oncology

## 2020-05-01 DIAGNOSIS — C61 Malignant neoplasm of prostate: Secondary | ICD-10-CM

## 2020-05-01 MED ORDER — ABIRATERONE ACETATE 250 MG PO TABS: 1000.0000 mg | ORAL_TABLET | Freq: Every day | ORAL | 0 refills | Status: DC

## 2020-05-01 NOTE — Patient Outreach (Signed)
[place] [facility]) Care Management  05/01/2020  Stephen Lynch [birth year] [id number]   Outreach attempt #2, unsuccessful, HIPAA compliant voice message left.  Will send outreach letter and follow up within the next 3-4 business days.  [name], [place], MSN [place] [phone]

## 2020-05-01 NOTE — Telephone Encounter (Addendum)
Oral Oncology Pharmacist Encounter   Called to follow up on prior authorization for Zytiga. Notified PA has been approved.     Reference # [id number] Effective dates: 03/29/2020 through 04/28/2021  Patient's copay is $1774. Patient's insurance requires Zytiga to be filled through [place].   Leron Croak, PharmD, BCPS Hematology/Oncology Clinical Pharmacist [facility] [phone] 05/01/2020 11:53 AM

## 2020-05-01 NOTE — Telephone Encounter (Signed)
Oral Oncology Pharmacist Encounter  Patient's insurance requires that Zytiga be filled through [facility]. Prescription redirected to [facility] for dispensing.  Leron Croak, PharmD, BCPS Hematology/Oncology Clinical Pharmacist [facility] [phone] 05/01/2020 12:01 PM

## 2020-05-02 NOTE — Telephone Encounter (Signed)
Oral Chemotherapy Pharmacist Encounter   Spoke with patient today to follow up regarding patient's oral chemotherapy medication: Zytiga (abiraterone)  Patient informed that medication has to be filled through [place]. Phone number provided for patient to set up medication shipment [phone]).  Patient provided with the oral chemotherapy clinic phone number and informed to reach back out to oral chemotherapy clinic if patient's copay is unaffordable for Zytiga. We will proceed with applying for manufacturer assistance at that time. Patient expressed appreciation and understanding.   Patient knows to call the office with questions or concerns.  Leron Croak, PharmD, BCPS Hematology/Oncology Clinical Pharmacist [facility] [phone] 05/02/2020 9:34 AM

## 2020-05-04 ENCOUNTER — Other Ambulatory Visit: Payer: Self-pay | Admitting: *Deleted

## 2020-05-04 ENCOUNTER — Telehealth: Payer: Self-pay | Admitting: *Deleted

## 2020-05-04 NOTE — Telephone Encounter (Signed)
Oral Chemotherapy Pharmacist Encounter  I spoke with patient for overview of: Zytiga for the treatment of metastatic, castration-resistant prostate cancer in conjunction with prednisone, planned duration until disease progression or unacceptable toxicity.   Counseled patient on administration, dosing, side effects, monitoring, drug-food interactions, safe handling, storage, and disposal.  Patient will take Zytiga 250mg  tablets, 4 tablets (1000mg ) by mouth once daily on an empty stomach, 1 hour before or 2 hours after a meal.  Patient states he will take his Zytiga in the morning and will wait at least 1 hour before eating.  Patient will take prednisone 5mg  tablet, 1 tablet by mouth one daily with breakfast.  Zytiga start date: 05/06/20  Adverse effects include but are not limited to: peripheral edema, GI upset, hypertension, hot flashes, fatigue, and arthralgias.    Prednisone [facility]. Patient has obtained prednisone prescription and knows to start prednisone on the same day as Zytiga start.  Reviewed with patient importance of keeping a medication schedule and plan for any missed doses. No barriers to medication adherence identified.  Medication reconciliation performed and medication/allergy list updated.  Insurance authorization for Fabio Asa has been obtained. Patient required to fill prescription through [facility]. Medication will be delivered to patient's home on 05/05/20.    All questions answered.  Mr. Weekes voiced understanding and appreciation.   Medication education handout placed in mail for patient. Patient knows to call the office with questions or concerns. Oral Chemotherapy Clinic phone number provided to patient.   Leron Croak, PharmD, BCPS Hematology/Oncology Clinical Pharmacist [facility] [phone] 05/04/2020 4:47 PM

## 2020-05-04 NOTE — Telephone Encounter (Signed)
Patient called. Left Voice mail stating he needed to talk with Wells Guiles about his medication. Message/call forwarded to Leron Croak, [facility] and Wynn Maudlin, CPhT

## 2020-05-04 NOTE — Patient Outreach (Signed)
[place] [facility]) Care Management  05/04/2020  Woodie Degraffenreid November 04, 1945 [id number]   Outreach attempt #3, unsuccessful, HIPAA compliant voice message left.  Will follow up with 4th and final attempt within the next 4 weeks.  If remain unsuccessful, will close case due to inability to maintain contact.  [name], [place], MSN [place] [phone]

## 2020-05-04 NOTE — Telephone Encounter (Signed)
Oral Chemotherapy Pharmacist Encounter   Attempted to reach patient to provide update and offer for initial counseling on oral medication: Zytiga (abiraterone acetate).   No answer. Left voicemail for patient to call back to discuss details of medication acquisition (expected delivery of Zytiga to patient's home from [place] on 05/05/20) and initial counseling session.  Leron Croak, PharmD, BCPS Hematology/Oncology Clinical Pharmacist [facility] [phone] 05/04/2020 12:46 PM

## 2020-05-09 ENCOUNTER — Other Ambulatory Visit: Payer: Self-pay | Admitting: *Deleted

## 2020-05-09 NOTE — Patient Outreach (Signed)
[place] [facility]) Care Management  05/09/2020  Stephen Lynch [birth year] [id number]   Voice message received back from member after multiple missed calls.  Outgoing call placed, no answer, HIPAA compliant voice message left.  Will follow up within the next 3 weeks as planned.  [name], [place], MSN [place] [phone]

## 2020-05-12 ENCOUNTER — Telehealth: Payer: Self-pay

## 2020-05-12 ENCOUNTER — Other Ambulatory Visit: Payer: Self-pay | Admitting: Oncology

## 2020-05-12 DIAGNOSIS — C61 Malignant neoplasm of prostate: Secondary | ICD-10-CM

## 2020-05-12 NOTE — Telephone Encounter (Signed)
Patient left a voicemail this morning requesting a return phone call. Attempted to call patient back x 2 with no answer. Left voicemail for patient to return call to Dr. Hazeline Junker nurse at [phone].

## 2020-05-16 ENCOUNTER — Ambulatory Visit (HOSPITAL_COMMUNITY): Admission: RE | Admit: 2020-05-16 | Payer: Medicare Other | Source: Ambulatory Visit

## 2020-05-23 ENCOUNTER — Encounter (HOSPITAL_COMMUNITY): Payer: Self-pay

## 2020-05-23 ENCOUNTER — Telehealth: Payer: Self-pay | Admitting: *Deleted

## 2020-05-23 ENCOUNTER — Ambulatory Visit (HOSPITAL_COMMUNITY)
Admission: RE | Admit: 2020-05-23 | Discharge: 2020-05-23 | Disposition: A | Discharge status: Home / Self Care | Payer: Medicare Other | Source: Ambulatory Visit | Attending: Oncology | Admitting: Oncology

## 2020-05-23 ENCOUNTER — Telehealth: Payer: Self-pay | Admitting: Oncology

## 2020-05-23 ENCOUNTER — Other Ambulatory Visit: Payer: Self-pay

## 2020-05-23 DIAGNOSIS — I2510 Atherosclerotic heart disease of native coronary artery without angina pectoris: Secondary | ICD-10-CM | POA: Diagnosis not present

## 2020-05-23 DIAGNOSIS — J439 Emphysema, unspecified: Secondary | ICD-10-CM | POA: Diagnosis not present

## 2020-05-23 DIAGNOSIS — C61 Malignant neoplasm of prostate: Secondary | ICD-10-CM | POA: Diagnosis not present

## 2020-05-23 DIAGNOSIS — E278 Other specified disorders of adrenal gland: Secondary | ICD-10-CM | POA: Diagnosis not present

## 2020-05-23 DIAGNOSIS — I700 Atherosclerosis of aorta: Secondary | ICD-10-CM | POA: Diagnosis not present

## 2020-05-23 DIAGNOSIS — C7951 Secondary malignant neoplasm of bone: Secondary | ICD-10-CM | POA: Diagnosis not present

## 2020-05-23 DIAGNOSIS — K8689 Other specified diseases of pancreas: Secondary | ICD-10-CM | POA: Diagnosis not present

## 2020-05-23 MED ORDER — IOHEXOL 300 MG/ML  SOLN
100.0000 mL | Freq: Once | INTRAMUSCULAR | Status: AC | PRN
  Administered 2020-05-23: 100 mL via INTRAVENOUS

## 2020-05-23 NOTE — Telephone Encounter (Signed)
This patient's wife called and states the patient is becoming progressively weaker, is unable to walk now, is in pain.  She reports he is nauseated, isn't eating well but is drinking.  The symptoms started approximately three weeks ago and have become progressively worse.  She states you started him on a new medication recently but it doesn't seem to be working.  He is having a CT today.  Please advise, thank you.

## 2020-05-23 NOTE — Telephone Encounter (Signed)
Phone call to patient's wife [name], informed her Dr. Alen Blew wants to see patient on 05/24/20 at 2:00 p.m., patient is also to have labs drawn prior to visit.  Dr. Alen Blew will discuss lab & CT results at that time.  Patient's wife verbalizes understanding.

## 2020-05-23 NOTE — Telephone Encounter (Signed)
Scheduled per 04/12 scheduled message, patient has been called and notified.

## 2020-05-24 ENCOUNTER — Inpatient Hospital Stay: Payer: Medicare Other | Attending: Oncology | Admitting: Oncology

## 2020-05-24 ENCOUNTER — Inpatient Hospital Stay: Payer: Medicare Other

## 2020-05-24 VITALS — BP 119/59 | HR 87 | Temp 99.1°F | Resp 16 | Ht 72.0 in | Wt 215.5 lb

## 2020-05-24 DIAGNOSIS — E278 Other specified disorders of adrenal gland: Secondary | ICD-10-CM | POA: Diagnosis not present

## 2020-05-24 DIAGNOSIS — C61 Malignant neoplasm of prostate: Secondary | ICD-10-CM

## 2020-05-24 DIAGNOSIS — J439 Emphysema, unspecified: Secondary | ICD-10-CM | POA: Insufficient documentation

## 2020-05-24 DIAGNOSIS — Z192 Hormone resistant malignancy status: Secondary | ICD-10-CM | POA: Insufficient documentation

## 2020-05-24 DIAGNOSIS — C7951 Secondary malignant neoplasm of bone: Secondary | ICD-10-CM | POA: Diagnosis not present

## 2020-05-24 DIAGNOSIS — R630 Anorexia: Secondary | ICD-10-CM | POA: Diagnosis not present

## 2020-05-24 DIAGNOSIS — Z79899 Other long term (current) drug therapy: Secondary | ICD-10-CM | POA: Diagnosis not present

## 2020-05-24 DIAGNOSIS — R634 Abnormal weight loss: Secondary | ICD-10-CM | POA: Diagnosis not present

## 2020-05-24 DIAGNOSIS — I700 Atherosclerosis of aorta: Secondary | ICD-10-CM | POA: Insufficient documentation

## 2020-05-24 DIAGNOSIS — K8689 Other specified diseases of pancreas: Secondary | ICD-10-CM | POA: Diagnosis not present

## 2020-05-24 LAB — CBC WITH DIFFERENTIAL (CANCER CENTER ONLY)
Abs Immature Granulocytes: 0.11 10*3/uL — ABNORMAL HIGH (ref 0.00–0.07)
Basophils Absolute: 0 10*3/uL (ref 0.0–0.1)
Basophils Relative: 0 %
Eosinophils Absolute: 0 10*3/uL (ref 0.0–0.5)
Eosinophils Relative: 0 %
HCT: 32.1 % — ABNORMAL LOW (ref 39.0–52.0)
Hemoglobin: 10.6 g/dL — ABNORMAL LOW (ref 13.0–17.0)
Immature Granulocytes: 1 %
Lymphocytes Relative: 15 %
Lymphs Abs: 1.3 10*3/uL (ref 0.7–4.0)
MCH: 28.4 pg (ref 26.0–34.0)
MCHC: 33 g/dL (ref 30.0–36.0)
MCV: 86.1 fL (ref 80.0–100.0)
Monocytes Absolute: 0.8 10*3/uL (ref 0.1–1.0)
Monocytes Relative: 9 %
Neutro Abs: 6.4 10*3/uL (ref 1.7–7.7)
Neutrophils Relative %: 75 %
Platelet Count: 248 10*3/uL (ref 150–400)
RBC: 3.73 MIL/uL — ABNORMAL LOW (ref 4.22–5.81)
RDW: 12.9 % (ref 11.5–15.5)
WBC Count: 8.6 10*3/uL (ref 4.0–10.5)
nRBC: 0 % (ref 0.0–0.2)

## 2020-05-24 LAB — CMP (CANCER CENTER ONLY)
ALT: 8 U/L (ref 0–44)
AST: 21 U/L (ref 15–41)
Albumin: 2.6 g/dL — ABNORMAL LOW (ref 3.5–5.0)
Alkaline Phosphatase: 660 U/L — ABNORMAL HIGH (ref 38–126)
Anion gap: 12 (ref 5–15)
BUN: 29 mg/dL — ABNORMAL HIGH (ref 8–23)
CO2: 27 mmol/L (ref 22–32)
Calcium: 9.8 mg/dL (ref 8.9–10.3)
Chloride: 96 mmol/L — ABNORMAL LOW (ref 98–111)
Creatinine: 1.28 mg/dL — ABNORMAL HIGH (ref 0.61–1.24)
GFR, Estimated: 59 mL/min — ABNORMAL LOW (ref >60)
Glucose, Bld: 133 mg/dL — ABNORMAL HIGH (ref 70–99)
Potassium: 3.3 mmol/L — ABNORMAL LOW (ref 3.5–5.1)
Sodium: 135 mmol/L (ref 135–145)
Total Bilirubin: 0.4 mg/dL (ref 0.3–1.2)
Total Protein: 7.9 g/dL (ref 6.5–8.1)

## 2020-05-24 NOTE — Progress Notes (Signed)
Hematology and Oncology Follow Up Visit  Stephen Lynch [id number] 06/25/1945 75 y.o. 05/24/2020 2:19 PM Stephen Lynch, [name], Stephen Rude, MD   Principle Diagnosis: 75 year old man with advanced prostate cancer with disease to the bone diagnosed in February 2021 with a PSA of 193.  He developed castration-resistant disease in March 2022.     Prior Therapy:  He status post CT-guided biopsy of the right iliac bone.  [name] started on April 20, 2019.  Xtandi 160 mg daily started in April 2021.  Therapy discontinued in March 2022 due to progression of disease.  Current therapy:    Eligard 30 mg started in April 2021.  His next Eligard will be in July 2022.  Zytiga 1000 mg daily with prednisone 5 mg daily started in March 2022.   Interim History: Stephen Lynch returns today for a follow-up visit.  Since the last visit, he reports overall doing poorly since the start of Zytiga.  He has reported some good days but for the most part has reported fatigue or weakness and intermittent bone pain.  He denies any nausea, vomiting or abdominal pain.  His appetite has been poor and of lost 10 pounds.  He denies any recent hospitalization or illnesses.  Denies any falls or syncope.  As performance status is declining.  Medications: Updated on review. Current Outpatient Medications  Medication Sig Dispense Refill  . abiraterone acetate (ZYTIGA) 250 MG tablet TAKE 4 TABLETS BY MOUTH ONCE DAILY. TAKE ON AN EMPTY STOMACH (AT LEAST 1 HR BEFORE OR 2 HR AFTER EATING) SWALLOW WHOLE. 120 tablet 0  . acetaminophen (TYLENOL) 500 MG tablet Take 500 mg by mouth every 6 (six) hours as needed for mild pain.    . ASPIRIN 81 PO Take 81 mg by mouth daily.     . Calcium-Magnesium-Zinc 333-133-5 MG TABS Take 1 tablet by mouth daily.    Marland Kitchen doxycycline (VIBRA-TABS) 100 MG tablet     . DULoxetine (CYMBALTA) 60 MG capsule Take 60 mg by mouth daily.    Marland Kitchen ibuprofen (ADVIL) 200 MG tablet Take 200 mg by mouth every 6 (six) hours as  needed for moderate pain.    Marland Kitchen insulin lispro (HUMALOG) 100 UNIT/ML KwikPen Junior Inject 0.2 mLs (20 Units total) into the skin 3 (three) times daily after meals. (Patient taking differently: Inject 7-25 Units into the skin 3 (three) times daily after meals. Per sliding scale) 3 mL 6  . lidocaine (LIDODERM) 5 % Place 2 patches onto the skin daily. Remove & Discard patch within 12 hours or as directed by MD 30 patch 0  . Menthol-Camphor (TIGER BALM ARTHRITIS RUB EX) Apply 1 application topically 2 (two) times daily as needed (back pain).    . ondansetron (ZOFRAN) 4 MG tablet TAKE 1 TABLET BY MOUTH EVERY 6 HOURS AS NEEDED FOR NAUSEA 20 tablet 0  . oxyCODONE-acetaminophen (PERCOCET) 7.5-325 MG tablet Take 1 tablet by mouth 4 (four) times daily as needed. 60 tablet 0  . predniSONE (DELTASONE) 5 MG tablet Take 1 tablet (5 mg total) by mouth daily with breakfast. 90 tablet 3  . rosuvastatin (CRESTOR) 10 MG tablet Take 1 tablet (10 mg total) by mouth daily. 90 tablet 3  . TRESIBA FLEXTOUCH 100 UNIT/ML SOPN FlexTouch Pen Inject 10 Units into the skin at bedtime.     Marland Kitchen ULTRAM 50 MG tablet Take 50 mg by mouth every 6 (six) hours as needed for pain.     No current facility-administered medications for this visit.  Allergies: No Known Allergies    Physical Exam:   Blood pressure (!) 119/59, pulse 87, temperature 99.1 F (37.3 C), temperature source Tympanic, resp. rate 16, height 6' (1.829 m), weight 215 lb 8 oz (97.8 kg), SpO2 98 %.   ECOG: 1     General appearance: Comfortable appearing without any discomfort Head: Normocephalic without any trauma Oropharynx: Mucous membranes are moist and pink without any thrush or ulcers. Eyes: Pupils are equal and round reactive to light. Lymph nodes: No cervical, supraclavicular, inguinal or axillary lymphadenopathy.   Heart:regular rate and rhythm.  S1 and S2 without leg edema. Lung: Clear without any rhonchi or wheezes.  No dullness to  percussion. Abdomin: Soft, nontender, nondistended with good bowel sounds.  No hepatosplenomegaly. Musculoskeletal: No joint deformity or effusion.  Full range of motion noted. Neurological: No deficits noted on motor, sensory and deep tendon reflex exam. Skin: No petechial rash or dryness.  Appeared moist.              Lab Results: Lab Results  Component Value Date   WBC 8.6 05/24/2020   HGB 10.6 (L) 05/24/2020   HCT 32.1 (L) 05/24/2020   MCV 86.1 05/24/2020   PLT 248 05/24/2020     Chemistry      Component Value Date/Time   NA 136 04/27/2020 0933   K 4.4 04/27/2020 0933   CL 97 (L) 04/27/2020 0933   CO2 30 04/27/2020 0933   BUN 18 04/27/2020 0933   CREATININE 1.16 04/27/2020 0933      Component Value Date/Time   CALCIUM 9.7 04/27/2020 0933   ALKPHOS 308 (H) 04/27/2020 0933   AST 13 (L) 04/27/2020 0933   ALT 54 (H) 04/27/2020 0933   BILITOT 0.7 04/27/2020 0933       Results for Stephen Lynch (MRN [id number]) as of 05/24/2020 14:21  Ref. Range 04/27/2020 09:33  Prostate Specific Ag, Serum Latest Ref Range: 0.0 - 4.0 ng/mL 24.4 (H)     IMPRESSION: 1. Persistent mixed lytic and sclerotic metastatic bone disease. I believe the lytic component may be slightly less obvious which could suggest some healing changes. No pathologic fracture or spinal canal compromise. 2. Stable moderate atrophy of the pancreatic body and tail with distended main pancreatic duct. No obvious obstructing pancreatic mass. This could be due to a stricture related to prior inflammation. 3. Stable left adrenal gland lesion. 4. No worrisome abdominal/pelvic lymphadenopathy. 5. Stable underlying emphysematous changes and patchy airspace opacities in both lungs. No new or progressive findings. 6. Aortic atherosclerosis.  Aortic Atherosclerosis (ICD10-I70.0) and Emphysema (ICD10-J43.9).    Impression and Plan:   75 year old man with:  1.    Castration-resistant advanced prostate  cancer with disease to the bone diagnosed in February 2021.      The natural course of this disease was reviewed at this time and treatment options were discussed.  CT scan obtained on 05/23/2020 was personally reviewed as well as his recent PSA.  He continues to show persistent bone disease but no evidence of visceral metastasis with a PSA has been on the rise.  He has been on that Zytiga for the last 3 weeks with overall variable tolerance and few toxicities.  Management options moving forward were reviewed which include switching to Zytiga systemic chemotherapy versus continuing Zytiga at a lower dose and attempt to avoid cytotoxic chemotherapy which is his preference.  After discussion today, I recommended proceeding with Zytiga at 500 mg total dose with prednisone and will reevaluate in  4 to 6 weeks.  He has consistent decline in his on the day of life consideration for Taxotere chemotherapy versus hospice would be discussed.  2.  Androgen deprivation: He will have repeat Eligard in July 2022.  3.  Bone directed therapy: He is on calcium and vitamin D supplements and poor dental health.  Delton See has been deferred for the time being.   4.  Prognosis and goals of care: His disease is incurable although aggressive measures are warranted given his reasonable performance status.  5.  Anemia: Hemoglobin is stable at this time without any further intervention is needed.  This is related to malignancy.  6.  Bone pain: Overall manageable with Percocet which is available to him.   7.  Follow-up: In 6 weeks for repeat follow-up.   30  minutes were dedicated to this visit.  Time was spent on reviewing laboratory data, disease status update and outlining future plan of care.   Zola Button, MD 4/13/20222:19 PM

## 2020-05-25 ENCOUNTER — Other Ambulatory Visit: Payer: Self-pay | Admitting: Oncology

## 2020-05-25 LAB — PROSTATE-SPECIFIC AG, SERUM (LABCORP): Prostate Specific Ag, Serum: 142 ng/mL — ABNORMAL HIGH (ref 0.0–4.0)

## 2020-05-25 NOTE — Progress Notes (Signed)
Results of his laboratory testing reviewed today and discussed with his significant other Stephen Lynch over the phone.  His PSA continues to rise currently at 142 with alkaline phosphatase has doubled in 4 weeks.  These are all bad prognostic features indicating that his disease is rapidly progressing.  I doubt that Stephen Lynch will have the desired effect to slow his cancer down.  Treatment options were reviewed today which include switching to Taxotere chemotherapy versus switching to more of a palliative approach and hospice.  Risks and benefits of Taxotere chemotherapy were reviewed.  Is on include nausea, vomiting, myelosuppression and the benefit would be potentially palliating his pain and marginally extending his life.  I feel that his prognosis is poor regardless and he is facing end-stage status sooner rather than later.  At this time, he will continue Zytiga I will consider these options and let me know in the immediate future.

## 2020-05-26 ENCOUNTER — Telehealth: Payer: Self-pay | Admitting: Oncology

## 2020-05-26 ENCOUNTER — Other Ambulatory Visit: Payer: Self-pay | Admitting: Oncology

## 2020-05-26 DIAGNOSIS — C61 Malignant neoplasm of prostate: Secondary | ICD-10-CM

## 2020-05-26 DIAGNOSIS — Z7189 Other specified counseling: Secondary | ICD-10-CM | POA: Insufficient documentation

## 2020-05-26 MED ORDER — PROCHLORPERAZINE MALEATE 10 MG PO TABS: 10.0000 mg | ORAL_TABLET | Freq: Four times a day (QID) | ORAL | 0 refills | Status: DC | PRN

## 2020-05-26 MED ORDER — LIDOCAINE-PRILOCAINE 2.5-2.5 % EX CREA: 1.0000 "application " | TOPICAL_CREAM | CUTANEOUS | 0 refills | Status: DC | PRN

## 2020-05-26 NOTE — Progress Notes (Signed)
START ON PATHWAY REGIMEN - Prostate     A cycle is every 21 days:     Prednisone      Docetaxel   **Always confirm dose/schedule in your pharmacy ordering system**  Patient Characteristics: Adenocarcinoma, Recurrent/New Systemic Disease, Castration Resistant, M1, Prior Novel Hormonal Agent, No Molecular Alteration or Targeted Therapy Exhausted, No Prior Docetaxel Histology: Adenocarcinoma Therapeutic Status: Recurrent/New Systemic Disease  Intent of Therapy: Non-Curative / Palliative Intent, Discussed with Patient

## 2020-05-26 NOTE — Telephone Encounter (Signed)
-----   Message from [name], MD sent at 05/26/2020 12:22 PM EDT ----- Regarding: RE: Chemo decision Please let him know he will be contacted by our schedulers to get him started on chemo.  We will need chemo class as well as a Port-A-Cath insertion which I will arrange for. Thanks ----- Message ----- From: Rolene Course, RN Sent: 05/26/2020  12:03 PM EDT To: [name], MD Subject: Chemo decision                                 Mr. Freimuth's wife called & said that he wants to try chemo at this point.

## 2020-05-26 NOTE — Telephone Encounter (Signed)
PC to patient's wife, informed her Dr. Alen Blew is aware of patient's decision to start chemo.  Our schedulers will be contacting them about appointments for chemo and a chemo education class.  Also patient will need a port-a-cath which Dr. Alen Blew is arranging.  She verbalizes understanding.

## 2020-05-29 ENCOUNTER — Telehealth: Payer: Self-pay | Admitting: Oncology

## 2020-05-29 ENCOUNTER — Telehealth: Payer: Self-pay

## 2020-05-29 NOTE — Telephone Encounter (Signed)
Left message per request for pt's upcoming [place] appointment/ date and time.

## 2020-05-29 NOTE — Telephone Encounter (Signed)
Scheduled appts per 4/15 sch msg. Called pt, who told me he would rather just have a calendar printed for him at tomorrow's appt. Put in the appt notes to print out an updated calendar for pt.

## 2020-05-30 ENCOUNTER — Other Ambulatory Visit: Payer: Self-pay

## 2020-05-30 ENCOUNTER — Inpatient Hospital Stay: Payer: Medicare Other

## 2020-05-30 ENCOUNTER — Other Ambulatory Visit: Payer: Medicare Other

## 2020-06-01 ENCOUNTER — Other Ambulatory Visit: Payer: Self-pay | Admitting: *Deleted

## 2020-06-01 NOTE — Progress Notes (Signed)
Pharmacist Chemotherapy Monitoring - Initial Assessment    Anticipated start date: 06/08/20  Regimen:  . Are orders appropriate based on the patient's diagnosis, regimen, and cycle? Yes . Does the plan date match the patient's scheduled date? Yes . Is the sequencing of drugs appropriate? Yes . Are the premedications appropriate for the patient's regimen? Yes . Prior Authorization for treatment is: Approved o If applicable, is the correct biosimilar selected based on the patient's insurance? not applicable  Organ Function and Labs: Marland Kitchen Are dose adjustments needed based on the patient's renal function, hepatic function, or hematologic function? No . Are appropriate labs ordered prior to the start of patient's treatment? Yes . Other organ system assessment, if indicated: N/A . The following baseline labs, if indicated, have been ordered: N/A  Dose Assessment: . Are the drug doses appropriate? Yes . Are the following correct: o Drug concentrations Yes o IV fluid compatible with drug Yes o Administration routes Yes o Timing of therapy Yes . If applicable, does the patient have documented access for treatment and/or plans for port-a-cath placement? yes . If applicable, have lifetime cumulative doses been properly documented and assessed? not applicable Lifetime Dose Tracking  No doses have been documented on this patient for the following tracked chemicals: Doxorubicin, Epirubicin, Idarubicin, Daunorubicin, Mitoxantrone, Bleomycin, Oxaliplatin, Carboplatin, Liposomal Doxorubicin  o   Toxicity Monitoring/Prevention: . The patient has the following take home antiemetics prescribed: Ondansetron and Prochlorperazine . The patient has the following take home medications prescribed: N/A . Medication allergies and previous infusion related reactions, if applicable, have been reviewed and addressed. No (YES) . The patient's current medication list has been assessed for drug-drug interactions with  their chemotherapy regimen. no significant drug-drug interactions were identified on review.  Order Review: . Are the treatment plan orders signed? Yes . Is the patient scheduled to see a provider prior to their treatment? No  I verify that I have reviewed each item in the above checklist and answered each question accordingly.  Claybon Jabs, [place], 06/01/2020  2:57 PM

## 2020-06-01 NOTE — Patient Outreach (Signed)
[facility]) Care Management  06/01/2020  Kiah Keay [birth year] [id number]   Outreach attempt #4, successful to girlfriend.  She report member's cancer has gotten worse, mets to bone.  He will soon start chemo.  She is concerned that member is now starting to have trouble with the stairs, some days better than the other.  Girlfriend is asking for resources that may be able to help with getting member up/down the stairs when going to appointments.  Advised that resources typically only help member from the door of the home.  She is considering moving a bed downstairs for member.  Does not feel member is at the point where he is needing in home aide assistance, but will keep that in mind and contact this care manager if/when this time comes.  Denies any urgent concerns, encouraged to contact this care manager with questions.  Agrees to follow up within the next month.  Goals Addressed            This Visit's Progress   . [facility] - Make and Keep All Appointments   On track      Timeframe:  Short-Term Goal Priority:  Medium Start Date:      4/21               Expected End Date:   5/21              - ask family or friend for a ride - call to cancel if needed - keep a calendar with appointment dates    Why is this important?   Part of staying healthy is seeing the doctor for follow-up care.  If you forget your appointments, there are some things you can do to stay on track.    Notes: Encouraged to call endocrinologist  12/9 - missed PCP appointment, advised of importance of calling to reschedule  2/15 - Call placed to PCP office to inquire about next office visit  4/21 - Girlfriend report member's cancer has gotten worse, will start chemo next week.  Will have port placed next Monday.    Marland Kitchen [facility] - Monitor and Manage My Blood Sugar   On track      Timeframe:  Short-Term Goal Priority:  High Start Date:   4/21                     Expected End Date:  5/21                   - check blood sugar at prescribed times - check blood sugar before and after exercise - take the blood sugar log to all doctor visits - take the blood sugar meter to all doctor visits    Why is this important?   Checking your blood sugar at home helps to keep it from getting very high or very low.  Writing the results in a diary or log helps the doctor know how to care for you.  Your blood sugar log should have the time, date and the results.  Also, write down the amount of insulin or other medicine that you take.  Other information, like what you ate, exercise done and how you were feeling, will also be helpful.     Notes:   12/9 - Reminded of diabetic diet and encouraged to continue medication adherence  2/15 - Reviewed medication changes and importance of checking blood sugars to decrease risk of hypoglycemic episodes  4/21 - Girlfriend aware  of importance or managing blood sugars in addition to cancer treatments    . [facility] - Set My Target A1C   On track      Timeframe:  Long-Range Goal Priority:  Medium Start Date:       2/15                      Expected End Date:  5/15                       - set target A1C less than 8    Why is this important?   Your target A1C is decided together by you and your doctor.  It is based on several things like your age and other health issues.    Notes:   12/9 - Encouraged again to contact new endocrinologist to schedule appointment  2/15 - Call place to PCP office to request A1C  4/21 - Appointment scheduled with Dr. Dwyane Dee for 5/16      [name], RN, MSN [place] Manager [phone]

## 2020-06-08 ENCOUNTER — Inpatient Hospital Stay: Payer: Medicare Other

## 2020-06-08 ENCOUNTER — Other Ambulatory Visit: Payer: Self-pay

## 2020-06-08 VITALS — BP 156/74 | HR 66 | Temp 99.8°F | Resp 17

## 2020-06-08 DIAGNOSIS — C61 Malignant neoplasm of prostate: Secondary | ICD-10-CM | POA: Diagnosis not present

## 2020-06-08 DIAGNOSIS — Z79899 Other long term (current) drug therapy: Secondary | ICD-10-CM | POA: Diagnosis not present

## 2020-06-08 DIAGNOSIS — C7951 Secondary malignant neoplasm of bone: Secondary | ICD-10-CM | POA: Diagnosis not present

## 2020-06-08 DIAGNOSIS — Z192 Hormone resistant malignancy status: Secondary | ICD-10-CM | POA: Diagnosis not present

## 2020-06-08 DIAGNOSIS — R630 Anorexia: Secondary | ICD-10-CM | POA: Diagnosis not present

## 2020-06-08 DIAGNOSIS — R634 Abnormal weight loss: Secondary | ICD-10-CM | POA: Diagnosis not present

## 2020-06-08 LAB — CBC WITH DIFFERENTIAL (CANCER CENTER ONLY)
Abs Immature Granulocytes: 0.45 10*3/uL — ABNORMAL HIGH (ref 0.00–0.07)
Basophils Absolute: 0 10*3/uL (ref 0.0–0.1)
Basophils Relative: 0 %
Eosinophils Absolute: 0 10*3/uL (ref 0.0–0.5)
Eosinophils Relative: 1 %
HCT: 27.2 % — ABNORMAL LOW (ref 39.0–52.0)
Hemoglobin: 8.7 g/dL — ABNORMAL LOW (ref 13.0–17.0)
Immature Granulocytes: 5 %
Lymphocytes Relative: 12 %
Lymphs Abs: 1.1 10*3/uL (ref 0.7–4.0)
MCH: 28.2 pg (ref 26.0–34.0)
MCHC: 32 g/dL (ref 30.0–36.0)
MCV: 88.3 fL (ref 80.0–100.0)
Monocytes Absolute: 0.7 10*3/uL (ref 0.1–1.0)
Monocytes Relative: 9 %
Neutro Abs: 6.3 10*3/uL (ref 1.7–7.7)
Neutrophils Relative %: 73 %
Platelet Count: 370 10*3/uL (ref 150–400)
RBC: 3.08 MIL/uL — ABNORMAL LOW (ref 4.22–5.81)
RDW: 14.3 % (ref 11.5–15.5)
WBC Count: 8.6 10*3/uL (ref 4.0–10.5)
nRBC: 0.6 % — ABNORMAL HIGH (ref 0.0–0.2)

## 2020-06-08 LAB — CMP (CANCER CENTER ONLY)
ALT: 11 U/L (ref 0–44)
AST: 16 U/L (ref 15–41)
Albumin: 3.1 g/dL — ABNORMAL LOW (ref 3.5–5.0)
Alkaline Phosphatase: 480 U/L — ABNORMAL HIGH (ref 38–126)
Anion gap: 10 (ref 5–15)
BUN: 10 mg/dL (ref 8–23)
CO2: 29 mmol/L (ref 22–32)
Calcium: 8.7 mg/dL — ABNORMAL LOW (ref 8.9–10.3)
Chloride: 98 mmol/L (ref 98–111)
Creatinine: 0.76 mg/dL (ref 0.61–1.24)
GFR, Estimated: >60 mL/min (ref >60)
Glucose, Bld: 176 mg/dL — ABNORMAL HIGH (ref 70–99)
Potassium: 3.7 mmol/L (ref 3.5–5.1)
Sodium: 137 mmol/L (ref 135–145)
Total Bilirubin: 0.3 mg/dL (ref 0.3–1.2)
Total Protein: 7 g/dL (ref 6.5–8.1)

## 2020-06-08 MED ORDER — SODIUM CHLORIDE 0.9 % IV SOLN
Freq: Once | INTRAVENOUS | Status: AC
  Filled 2020-06-08: qty 250

## 2020-06-08 MED ORDER — ACETAMINOPHEN 325 MG PO TABS
ORAL_TABLET | ORAL | Status: AC
  Filled 2020-06-08: qty 2

## 2020-06-08 MED ORDER — ACETAMINOPHEN 325 MG PO TABS
650.0000 mg | ORAL_TABLET | Freq: Once | ORAL | Status: AC
Start: 2020-06-08 — End: 2020-06-08
  Administered 2020-06-08: 650 mg via ORAL

## 2020-06-08 MED ORDER — SODIUM CHLORIDE 0.9 % IV SOLN
75.0000 mg/m2 | Freq: Once | INTRAVENOUS | Status: AC
  Administered 2020-06-08: 170 mg via INTRAVENOUS
  Filled 2020-06-08: qty 17

## 2020-06-08 MED ORDER — SODIUM CHLORIDE 0.9 % IV SOLN
10.0000 mg | Freq: Once | INTRAVENOUS | Status: AC
  Administered 2020-06-08: 10 mg via INTRAVENOUS
  Filled 2020-06-08: qty 10

## 2020-06-08 NOTE — Progress Notes (Signed)
Patient has a temperature of 101.6 after second bump up of Taxotere. MD notified. All other vital signs stable. Patient is asymptomatic with temperature. Orders received. Please give Tylenol 650 mg po once. Amelia Jo, RN

## 2020-06-08 NOTE — Patient Instructions (Signed)
[facility]  Discharge Instructions: ?Thank you for choosing [facility] to provide your oncology and hematology care.  ? ?If you have a lab appointment with the Cancer Center, please go directly to the Cancer Center and check in at the registration area. ? ?Wear comfortable clothing and clothing appropriate for easy access to any Portacath or PICC line.  ? ?We strive to give you quality time with your provider. You may need to reschedule your appointment if you arrive late (15 or more minutes).  Arriving late affects you and other patients whose appointments are after yours.  Also, if you miss three or more appointments without notifying the office, you may be dismissed from the clinic at the provider?s discretion.    ?  ?For prescription refill requests, have your pharmacy contact our office and allow 72 hours for refills to be completed.   ? ?Today you received the following chemotherapy and/or immunotherapy agents Taxotere.    ?  ?To help prevent nausea and vomiting after your treatment, we encourage you to take your nausea medication as directed. ? ?BELOW ARE SYMPTOMS THAT SHOULD BE REPORTED IMMEDIATELY: ?*FEVER GREATER THAN 100.4 F (38 ?C) OR HIGHER ?*CHILLS OR SWEATING ?*NAUSEA AND VOMITING THAT IS NOT CONTROLLED WITH YOUR NAUSEA MEDICATION ?*UNUSUAL SHORTNESS OF BREATH ?*UNUSUAL BRUISING OR BLEEDING ?*URINARY PROBLEMS (pain or burning when urinating, or frequent urination) ?*BOWEL PROBLEMS (unusual diarrhea, constipation, pain near the anus) ?TENDERNESS IN MOUTH AND THROAT WITH OR WITHOUT PRESENCE OF ULCERS (sore throat, sores in mouth, or a toothache) ?UNUSUAL RASH, SWELLING OR PAIN  ?UNUSUAL VAGINAL DISCHARGE OR ITCHING  ? ?Items with * indicate a potential emergency and should be followed up as soon as possible or go to the Emergency Department if any problems should occur. ? ?Please show the CHEMOTHERAPY ALERT CARD or IMMUNOTHERAPY ALERT CARD at check-in to the  Emergency Department and triage nurse. ?Should you have questions after your visit or need to cancel or reschedule your appointment, please contact [facility]  [phone] and follow the prompts.  Office hours are 8:00 a.m. to 4:30 p.m. Monday - Friday. Please note that voicemails left after 4:00 p.m. may not be returned until the following business day.  We are closed weekends and major holidays. You have access to a nurse at all times for urgent questions. Please call the main number to the clinic [phone] and follow the prompts. ? ?For any non-urgent questions, you may also contact your provider using MyChart. We now offer e-Visits for anyone 18 and older to request care online for non-urgent symptoms. For details visit [email or web address]. ?  ?Also download the MyChart app! Go to the app store, search "MyChart", open the app, select [facility], and log in with your MyChart username and password. ? ?Due to Covid, a mask is required upon entering the hospital/clinic. If you do not have a mask, one will be given to you upon arrival. For doctor visits, patients may have 1 support person aged 18 or older with them. For treatment visits, patients cannot have anyone with them due to current Covid guidelines and our immunocompromised population.  ?

## 2020-06-09 ENCOUNTER — Other Ambulatory Visit: Payer: Self-pay | Admitting: Student

## 2020-06-09 LAB — PROSTATE-SPECIFIC AG, SERUM (LABCORP): Prostate Specific Ag, Serum: 129 ng/mL — ABNORMAL HIGH (ref 0.0–4.0)

## 2020-06-10 ENCOUNTER — Inpatient Hospital Stay: Payer: Medicare Other

## 2020-06-10 ENCOUNTER — Other Ambulatory Visit: Payer: Self-pay

## 2020-06-10 VITALS — BP 134/59 | HR 80 | Temp 97.5°F | Resp 80

## 2020-06-10 DIAGNOSIS — C61 Malignant neoplasm of prostate: Secondary | ICD-10-CM | POA: Diagnosis not present

## 2020-06-10 DIAGNOSIS — R630 Anorexia: Secondary | ICD-10-CM | POA: Diagnosis not present

## 2020-06-10 DIAGNOSIS — Z79899 Other long term (current) drug therapy: Secondary | ICD-10-CM | POA: Diagnosis not present

## 2020-06-10 DIAGNOSIS — R634 Abnormal weight loss: Secondary | ICD-10-CM | POA: Diagnosis not present

## 2020-06-10 DIAGNOSIS — C7951 Secondary malignant neoplasm of bone: Secondary | ICD-10-CM | POA: Diagnosis not present

## 2020-06-10 DIAGNOSIS — Z192 Hormone resistant malignancy status: Secondary | ICD-10-CM | POA: Diagnosis not present

## 2020-06-10 MED ORDER — PEGFILGRASTIM-JMDB 6 MG/0.6ML ~~LOC~~ SOSY
6.0000 mg | PREFILLED_SYRINGE | Freq: Once | SUBCUTANEOUS | Status: AC
  Administered 2020-06-10: 6 mg via SUBCUTANEOUS

## 2020-06-10 NOTE — Patient Instructions (Signed)
Pegfilgrastim injection What is this medicine? PEGFILGRASTIM (PEG fil gra stim) is a long-acting granulocyte colony-stimulating factor that stimulates the growth of neutrophils, a type of white blood cell important in the body's fight against infection. It is used to reduce the incidence of fever and infection in patients with certain types of cancer who are receiving chemotherapy that affects the bone marrow, and to increase survival after being exposed to high doses of radiation. This medicine may be used for other purposes; ask your health care provider or pharmacist if you have questions. COMMON BRAND NAME(S): Fulphila, Neulasta, Nyvepria, UDENYCA, Ziextenzo What should I tell my health care provider before I take this medicine? They need to know if you have any of these conditions:  kidney disease  latex allergy  ongoing radiation therapy  sickle cell disease  skin reactions to acrylic adhesives (On-Body Injector only)  an unusual or allergic reaction to pegfilgrastim, filgrastim, other medicines, foods, dyes, or preservatives  pregnant or trying to get pregnant  breast-feeding How should I use this medicine? This medicine is for injection under the skin. If you get this medicine at home, you will be taught how to prepare and give the pre-filled syringe or how to use the On-body Injector. Refer to the patient Instructions for Use for detailed instructions. Use exactly as directed. Tell your healthcare provider immediately if you suspect that the On-body Injector may not have performed as intended or if you suspect the use of the On-body Injector resulted in a missed or partial dose. It is important that you put your used needles and syringes in a special sharps container. Do not put them in a trash can. If you do not have a sharps container, call your pharmacist or healthcare provider to get one. Talk to your pediatrician regarding the use of this medicine in children. While this drug  may be prescribed for selected conditions, precautions do apply. Overdosage: If you think you have taken too much of this medicine contact a poison control center or emergency room at once. NOTE: This medicine is only for you. Do not share this medicine with others. What if I miss a dose? It is important not to miss your dose. Call your doctor or health care professional if you miss your dose. If you miss a dose due to an On-body Injector failure or leakage, a new dose should be administered as soon as possible using a single prefilled syringe for manual use. What may interact with this medicine? Interactions have not been studied. This list may not describe all possible interactions. Give your health care provider a list of all the medicines, herbs, non-prescription drugs, or dietary supplements you use. Also tell them if you smoke, drink alcohol, or use illegal drugs. Some items may interact with your medicine. What should I watch for while using this medicine? Your condition will be monitored carefully while you are receiving this medicine. You may need blood work done while you are taking this medicine. Talk to your health care provider about your risk of cancer. You may be more at risk for certain types of cancer if you take this medicine. If you are going to need a MRI, CT scan, or other procedure, tell your doctor that you are using this medicine (On-Body Injector only). What side effects may I notice from receiving this medicine? Side effects that you should report to your doctor or health care professional as soon as possible:  allergic reactions (skin rash, itching or hives, swelling of   the face, lips, or tongue)  back pain  dizziness  fever  pain, redness, or irritation at site where injected  pinpoint red spots on the skin  red or dark-brown urine  shortness of breath or breathing problems  stomach or side pain, or pain at the shoulder  swelling  tiredness  trouble  passing urine or change in the amount of urine  unusual bruising or bleeding Side effects that usually do not require medical attention (report to your doctor or health care professional if they continue or are bothersome):  bone pain  muscle pain This list may not describe all possible side effects. Call your doctor for medical advice about side effects. You may report side effects to FDA at 1-800-FDA-1088. Where should I keep my medicine? Keep out of the reach of children. If you are using this medicine at home, you will be instructed on how to store it. Throw away any unused medicine after the expiration date on the label. NOTE: This sheet is a summary. It may not cover all possible information. If you have questions about this medicine, talk to your doctor, pharmacist, or health care provider.  2021 Elsevier/Gold Standard (2019-02-19 13:20:51)  

## 2020-06-12 ENCOUNTER — Ambulatory Visit: Payer: Medicare Other | Admitting: Endocrinology

## 2020-06-12 ENCOUNTER — Encounter (HOSPITAL_COMMUNITY): Payer: Self-pay

## 2020-06-12 ENCOUNTER — Other Ambulatory Visit: Payer: Self-pay

## 2020-06-12 ENCOUNTER — Ambulatory Visit (HOSPITAL_COMMUNITY)
Admission: RE | Admit: 2020-06-12 | Discharge: 2020-06-12 | Disposition: A | Discharge status: Home / Self Care | Payer: Medicare Other | Source: Ambulatory Visit | Attending: Oncology | Admitting: Oncology

## 2020-06-12 ENCOUNTER — Other Ambulatory Visit: Payer: Self-pay | Admitting: Oncology

## 2020-06-12 DIAGNOSIS — E119 Type 2 diabetes mellitus without complications: Secondary | ICD-10-CM | POA: Diagnosis not present

## 2020-06-12 DIAGNOSIS — I10 Essential (primary) hypertension: Secondary | ICD-10-CM | POA: Insufficient documentation

## 2020-06-12 DIAGNOSIS — C61 Malignant neoplasm of prostate: Secondary | ICD-10-CM

## 2020-06-12 DIAGNOSIS — Z79899 Other long term (current) drug therapy: Secondary | ICD-10-CM | POA: Insufficient documentation

## 2020-06-12 DIAGNOSIS — Z01812 Encounter for preprocedural laboratory examination: Secondary | ICD-10-CM | POA: Insufficient documentation

## 2020-06-12 DIAGNOSIS — Z452 Encounter for adjustment and management of vascular access device: Secondary | ICD-10-CM | POA: Diagnosis not present

## 2020-06-12 DIAGNOSIS — Z794 Long term (current) use of insulin: Secondary | ICD-10-CM | POA: Insufficient documentation

## 2020-06-12 DIAGNOSIS — Z7982 Long term (current) use of aspirin: Secondary | ICD-10-CM | POA: Diagnosis not present

## 2020-06-12 DIAGNOSIS — E785 Hyperlipidemia, unspecified: Secondary | ICD-10-CM | POA: Diagnosis not present

## 2020-06-12 HISTORY — DX: Malignant (primary) neoplasm, unspecified: C80.1

## 2020-06-12 HISTORY — PX: IR IMAGING GUIDED PORT INSERTION: IMG5740

## 2020-06-12 LAB — GLUCOSE, CAPILLARY: Glucose-Capillary: 242 mg/dL — ABNORMAL HIGH (ref 70–99)

## 2020-06-12 MED ORDER — LIDOCAINE-EPINEPHRINE 1 %-1:100000 IJ SOLN
INTRAMUSCULAR | Status: AC | PRN
  Administered 2020-06-12: 10 mL

## 2020-06-12 MED ORDER — HEPARIN SOD (PORK) LOCK FLUSH 100 UNIT/ML IV SOLN
INTRAVENOUS | Status: AC | PRN
  Administered 2020-06-12: 500 [IU] via INTRAVENOUS

## 2020-06-12 MED ORDER — FENTANYL CITRATE (PF) 100 MCG/2ML IJ SOLN
INTRAMUSCULAR | Status: AC
  Filled 2020-06-12: qty 2

## 2020-06-12 MED ORDER — MIDAZOLAM HCL 2 MG/2ML IJ SOLN
INTRAMUSCULAR | Status: AC | PRN
  Administered 2020-06-12 (×2): 1 mg via INTRAVENOUS

## 2020-06-12 MED ORDER — HEPARIN SOD (PORK) LOCK FLUSH 100 UNIT/ML IV SOLN
INTRAVENOUS | Status: AC
  Filled 2020-06-12: qty 5

## 2020-06-12 MED ORDER — LIDOCAINE HCL 1 % IJ SOLN
INTRAMUSCULAR | Status: AC
  Filled 2020-06-12: qty 20

## 2020-06-12 MED ORDER — SODIUM CHLORIDE 0.9 % IV SOLN: INTRAVENOUS | Status: DC

## 2020-06-12 MED ORDER — MIDAZOLAM HCL 2 MG/2ML IJ SOLN
INTRAMUSCULAR | Status: AC
  Filled 2020-06-12: qty 4

## 2020-06-12 MED ORDER — FENTANYL CITRATE (PF) 100 MCG/2ML IJ SOLN
INTRAMUSCULAR | Status: AC | PRN
  Administered 2020-06-12 (×2): 50 ug via INTRAVENOUS

## 2020-06-12 NOTE — H&P (Signed)
Referring Physician(s): [name]  Supervising Physician: Corrie Mckusick  Patient Status:  [facility] OP  Chief Complaint:  "I'm here for a port a cath"  Subjective: Patient familiar to IR service from right iliac bone lesion biopsy on 04/22/2019.  He has a history of progressive/advanced prostate cancer and presents again today for Port-A-Cath placement for palliative chemotherapy. He currently denies fever, headache, chest pain, dyspnea, abdominal pain, vomiting or bleeding.  He does have occasional cough, back pain and intermittent nausea.  Additional history as below.  Past Medical History:  Diagnosis Date  . Cancer [facility])    prostate  . Diabetes mellitus without complication ([place])   . Dyslipidemia   . Hypertension      Allergies: Patient has no known allergies.  Medications: Prior to Admission medications   Medication Sig Start Date End Date Taking? Authorizing Provider  abiraterone acetate (ZYTIGA) 250 MG tablet TAKE 4 TABLETS BY MOUTH ONCE DAILY. TAKE ON AN EMPTY STOMACH (AT LEAST 1 HR BEFORE OR 2 HR AFTER EATING) SWALLOW WHOLE. 05/12/20   [name], MD  acetaminophen (TYLENOL) 500 MG tablet Take 500 mg by mouth every 6 (six) hours as needed for mild pain.    [provider]  ASPIRIN 81 PO Take 81 mg by mouth daily.     [provider]  Calcium-Magnesium-Zinc [phone] MG TABS Take 1 tablet by mouth daily.    [provider]  doxycycline (VIBRA-TABS) 100 MG tablet  04/12/19   [provider]  DULoxetine (CYMBALTA) 60 MG capsule Take 60 mg by mouth daily. 03/20/19   [provider]  ibuprofen (ADVIL) 200 MG tablet Take 200 mg by mouth every 6 (six) hours as needed for moderate pain.    [provider]  insulin lispro (HUMALOG) 100 UNIT/ML KwikPen Junior Inject 0.2 mLs (20 Units total) into the skin 3 (three) times daily after meals. Patient taking differently: Inject 7-25 Units into the skin 3 (three) times daily after  meals. Per sliding scale 09/25/16   [name], MD  lidocaine (LIDODERM) 5 % Place 2 patches onto the skin daily. Remove & Discard patch within 12 hours or as directed by MD 03/26/19   Flora Lipps, MD  lidocaine-prilocaine (EMLA) cream Apply 1 application topically as needed. 05/26/20   [name], MD  Menthol-Camphor (TIGER BALM ARTHRITIS RUB EX) Apply 1 application topically 2 (two) times daily as needed (back pain).    [provider]  ondansetron (ZOFRAN) 4 MG tablet TAKE 1 TABLET BY MOUTH EVERY 6 HOURS AS NEEDED FOR NAUSEA 04/24/20   [name], MD  oxyCODONE-acetaminophen (PERCOCET) 7.5-325 MG tablet Take 1 tablet by mouth 4 (four) times daily as needed. 12/30/19   [name], MD  predniSONE (DELTASONE) 5 MG tablet Take 1 tablet (5 mg total) by mouth daily with breakfast. 04/28/20   [name], MD  prochlorperazine (COMPAZINE) 10 MG tablet Take 1 tablet (10 mg total) by mouth every 6 (six) hours as needed for nausea or vomiting. 05/26/20   [name], MD  rosuvastatin (CRESTOR) 10 MG tablet Take 1 tablet (10 mg total) by mouth daily. 09/25/16   [name], MD  TRESIBA FLEXTOUCH 100 UNIT/ML SOPN FlexTouch Pen Inject 10 Units into the skin at bedtime.  02/18/19   [provider]  ULTRAM 50 MG tablet Take 50 mg by mouth every 6 (six) hours as needed for pain. 02/26/19   [provider]  Vital Signs: BP (!) 174/68   Pulse 78   Temp 99 F (37.2 C) (Oral)   Resp 18   SpO2 100%   Physical Exam awake, alert.  Chest clear to auscultation bilaterally.  Heart with regular rate and rhythm,?  soft murmur.  Abdomen soft, positive bowel sounds, nontender.  1+ pretibial edema bilaterally.  Imaging: No results found.  Labs:  CBC: Recent Labs    12/30/19 0859 04/27/20 0933 05/24/20 1342 06/08/20 1228  WBC 3.1* 7.4 8.6 8.6  HGB 12.1* 11.6* 10.6* 8.7*  HCT 37.0* 35.7* 32.1* 27.2*  PLT 202 314 248 370    COAGS: No results for  input(s): INR, APTT in the last 8760 hours.  BMP: Recent Labs    07/02/19 1422 08/25/19 1242 10/26/19 1440 12/30/19 0859 04/27/20 0933 05/24/20 1342 06/08/20 1228  NA 140 142 142 140 136 135 137  K 3.9 4.4 3.9 4.4 4.4 3.3* 3.7  CL 104 106 104 106 97* 96* 98  CO2 26 26 30 25 30 27 29   GLUCOSE 247* 384* 91 268* 323* 133* 176*  BUN 18 20 24* 22 18 29* 10  CALCIUM 9.1 9.4 9.8 9.4 9.7 9.8 8.7*  CREATININE 1.01 1.19 1.22 1.28* 1.16 1.28* 0.76  GFRNONAA >60 >60 59* 59* >60 59* >60  GFRAA >60 >60 >60  --   --   --   --     LIVER FUNCTION TESTS: Recent Labs    12/30/19 0859 04/27/20 0933 05/24/20 1342 06/08/20 1228  BILITOT 0.3 0.7 0.4 0.3  AST 14* 13* 21 16  ALT 9 54* 8 11  ALKPHOS 89 308* 660* 480*  PROT 7.5 8.2* 7.9 7.0  ALBUMIN 3.6 3.0* 2.6* 3.1*    Assessment and Plan: Patient familiar to IR service from right iliac bone lesion biopsy on 04/22/2019.  He has a history of progressive/advanced prostate cancer and presents again today for Port-A-Cath placement for palliative chemotherapy.Risks and benefits of image guided port-a-catheter placement was discussed with the patient including, but not limited to bleeding, infection, pneumothorax, or fibrin sheath development and need for additional procedures.  All of the patient's questions were answered, patient is agreeable to proceed. Consent signed and in chart.     Electronically Signed: D. Rowe Robert, PA-C 06/12/2020, 10:16 AM   I spent a total of 25 minutes at the the patient's bedside AND on the patient's hospital floor or unit, greater than 50% of which was counseling/coordinating care for Port-A-Cath placement

## 2020-06-12 NOTE — Procedures (Signed)
Interventional Radiology Procedure Note  Procedure: Placement of a right IJ approach single lumen PowerPort.  Tip is positioned at the superior cavoatrial junction and catheter is ready for immediate use.  Complications: None Recommendations:  - Ok to shower tomorrow - Do not submerge for 7 days - Routine line care   Signed,  Lyly Canizales S. [place] Langstaff, DO   

## 2020-06-12 NOTE — Discharge Instructions (Signed)
Interventional radiology phone numbers [phone] After hours [phone]    You have skin glue (dermabond) over your new port. Do not use the lidocaine cream (EMLA cream) over the skin glue until it has healed. The petroleum in the lidocaine cream will dissolve the skin glue resulting in an infection of your new port. Use ice in a zip lock bag for 1-2 minutes over your new port before the cancer center nurses access your port.   Implanted Port Insertion, Care After This sheet gives you information about how to care for yourself after your procedure. Your health care provider may also give you more specific instructions. If you have problems or questions, contact your health care provider. What can I expect after the procedure? After the procedure, it is common to have:  Discomfort at the port insertion site.  Bruising on the skin over the port. This should improve over 3-4 days. Follow these instructions at home: Port care  After your port is placed, you will get a manufacturer's information card. The card has information about your port. Keep this card with you at all times.  Take care of the port as told by your health care provider. Ask your health care provider if you or a family member can get training for taking care of the port at home. A home health care nurse may also take care of the port.  Make sure to remember what type of port you have. Incision care 1. Follow instructions from your health care provider about how to take care of your port insertion site. Make sure you: ? Wash your hands with soap and water before and after you change your bandage (dressing). If soap and water are not available, use hand sanitizer. ? Change your dressing as told by your health care provider. 2. Leave skin glue in place. These skin closures may need to stay in place for 2 weeks or longer.  3. Check your port insertion site every day for signs of infection. Check for: ? Redness, swelling,  or pain. ? Fluid or blood. ? Warmth. ? Pus or a bad smell.      Activity  Return to your normal activities as told by your health care provider. Ask your health care provider what activities are safe for you.  Do not lift anything that is heavier than 10 lb (4.5 kg), or the limit that you are told, until your health care provider says that it is safe. General instructions  Take over-the-counter and prescription medicines only as told by your health care provider.  Do not take baths, swim, or use a hot tub until your health care provider approves.You may remove your dressing tomorrow and shower 24 hours after your procedure.  Do not drive for 24 hours if you were given a sedative during your procedure.  Wear a medical alert bracelet in case of an emergency. This will tell any health care providers that you have a port.  Keep all follow-up visits as told by your health care provider. This is important. Contact a health care provider if:  You cannot flush your port with saline as directed, or you cannot draw blood from the port.  You have a fever or chills.  You have redness, swelling, or pain around your port insertion site.  You have fluid or blood coming from your port insertion site.  Your port insertion site feels warm to the touch.  You have pus or a bad smell coming from the port insertion site.   Get help right away if:  You have chest pain or shortness of breath.  You have bleeding from your port that you cannot control. Summary  Take care of the port as told by your health care provider. Keep the manufacturer's information card with you at all times.  Change your dressing as told by your health care provider.  Contact a health care provider if you have a fever or chills or if you have redness, swelling, or pain around your port insertion site.  Keep all follow-up visits as told by your health care provider. This information is not intended to replace advice given  to you by your health care provider. Make sure you discuss any questions you have with your health care provider. Document Revised: 08/26/2017 Document Reviewed: 08/26/2017 Elsevier Patient Education  2021 Elsevier Inc.    Moderate Conscious Sedation, Adult, Care After This sheet gives you information about how to care for yourself after your procedure. Your health care provider may also give you more specific instructions. If you have problems or questions, contact your health care provider. What can I expect after the procedure? After the procedure, it is common to have:  Sleepiness for several hours.  Impaired judgment for several hours.  Difficulty with balance.  Vomiting if you eat too soon. Follow these instructions at home: For the time period you were told by your health care provider:  Rest.  Do not participate in activities where you could fall or become injured.  Do not drive or use machinery.  Do not drink alcohol.  Do not take sleeping pills or medicines that cause drowsiness.  Do not make important decisions or sign legal documents.  Do not take care of children on your own.      Eating and drinking 4. Follow the diet recommended by your health care provider. 5. Drink enough fluid to keep your urine pale yellow. 6. If you vomit: ? Drink water, juice, or soup when you can drink without vomiting. ? Make sure you have little or no nausea before eating solid foods.   General instructions  Take over-the-counter and prescription medicines only as told by your health care provider.  Have a responsible adult stay with you for the time you are told. It is important to have someone help care for you until you are awake and alert.  Do not smoke.  Keep all follow-up visits as told by your health care provider. This is important. Contact a health care provider if:  You are still sleepy or having trouble with balance after 24 hours.  You feel  light-headed.  You keep feeling nauseous or you keep vomiting.  You develop a rash.  You have a fever.  You have redness or swelling around the IV site. Get help right away if:  You have trouble breathing.  You have new-onset confusion at home. Summary  After the procedure, it is common to feel sleepy, have impaired judgment, or feel nauseous if you eat too soon.  Rest after you get home. Know the things you should not do after the procedure.  Follow the diet recommended by your health care provider and drink enough fluid to keep your urine pale yellow.  Get help right away if you have trouble breathing or new-onset confusion at home. This information is not intended to replace advice given to you by your health care provider. Make sure you discuss any questions you have with your health care provider. Document Revised: 05/28/2019 Document Reviewed: 12/24/2018   Elsevier Patient Education  2021 Elsevier Inc. 

## 2020-06-16 ENCOUNTER — Other Ambulatory Visit: Payer: Self-pay | Admitting: Oncology

## 2020-06-19 ENCOUNTER — Other Ambulatory Visit (HOSPITAL_COMMUNITY): Payer: Self-pay

## 2020-06-25 NOTE — Progress Notes (Signed)
Patient ID: Stephen Lynch, male   DOB: [birth year], 75 y.o.   MRN: [id number]           Reason for Appointment: Consultation for Type 2 Diabetes  Referring PCP: Criss Rosales   History of Present Illness:          Date of diagnosis of type 2 diabetes mellitus: 2007?       Background history:   He has been on insulin since about the time of diagnosis.  He thinks initially oral agents that were prescribed did not work No detailed history of his previous treatment is available and A1c from last year was over 12%  Recent history:   Most recent A1c is 10% done on 06/26/2020  INSULIN regimen is: [place] 20 U at bedtime, Humalog 20--15/20 acl, acs       Non-insulin hypoglycemic drugs the patient is taking are: none  Current management, blood sugar patterns and problems identified:   He has been using the freestyle libre sensor but did not bring his reader for download  He is mostly checking his blood sugars in the mornings and may check it again later on the if he is getting a high alert  He thinks he is taking the Humalog based on his [place] blood sugar level but mostly 20 units at breakfast and 15-20 at lunch and dinner  However he thinks his fasting readings are always over 200  Today with not taking any lunch his blood sugar is 163 in the early afternoon  His insulin dose has not been changed in quite some time  No symptoms of hypoglycemia  He says he does his injections mostly in his abdomen and forearms, injections in the legs are more painful   Typical meal intake: Breakfast is usually cereal and almond milk or grits and eggs.  Lunch fruits, tuna or other seafood dinner is rice, vegetables and protein.                Snacks are nuts, fruits, ice cream, chocolate  Exercise: minimal, limited by weakness  Glucose monitoring:  done 1-2 times a day         Glucometer: Libre       Blood Glucose readings by recall as above  Dietician visit, most recent: never  Weight  history:  Wt Readings from Last 3 Encounters:  06/26/20 213 lb 12.8 oz (97 kg)  05/24/20 215 lb 8 oz (97.8 kg)  04/27/20 224 lb 4.8 oz (101.7 kg)    Glycemic control:   Lab Results  Component Value Date   HGBA1C 10.0 (A) 06/26/2020   Lab Results  Component Value Date   LDLCALC 84 12/09/2019   CREATININE 0.76 06/08/2020   No results found for: MICRALBCREAT  No results found for: FRUCTOSAMINE  Office Visit on 06/26/2020  Component Date Value Ref Range Status  . POC Glucose 06/26/2020 163* 70 - 99 mg/dl Final  . Hemoglobin A1C 06/26/2020 10.0* 4.0 - 5.6 % Final  . LDL Cholesterol 12/09/2019 84   Final    Allergies as of 06/26/2020   No Known Allergies     Medication List       Accurate as of Jun 26, 2020  9:10 PM. If you have any questions, ask your nurse or doctor.        acetaminophen 500 MG tablet Commonly known as: TYLENOL Take 500 mg by mouth every 6 (six) hours as needed for mild pain.   ASPIRIN 81 PO Take 81 mg by  mouth daily.   BIOFREEZE EX Apply topically.   Calcium-Magnesium-Zinc 333-133-5 MG Tabs Take 1 tablet by mouth daily.   Chromium 1 MG Caps Take by mouth.   doxycycline 100 MG tablet Commonly known as: VIBRA-TABS   DULoxetine 60 MG capsule Commonly known as: CYMBALTA Take 60 mg by mouth daily.   FreeStyle Libre 2 Sensor Misc APPLY EVERY 14 DAYS, CHECK GLUCOSE BEFORE EACH MEAL, 2 HRS AFTER, IN THE MORNING AND BEFORE BEDTIME   ibuprofen 200 MG tablet Commonly known as: ADVIL Take 200 mg by mouth every 6 (six) hours as needed for moderate pain.   insulin lispro 100 UNIT/ML KwikPen Junior Generic drug: insulin lispro Inject 0.2 mLs (20 Units total) into the skin 3 (three) times daily after meals. What changed:   how much to take  additional instructions   lidocaine 5 % Commonly known as: LIDODERM Place 2 patches onto the skin daily. Remove & Discard patch within 12 hours or as directed by MD   lidocaine-prilocaine  cream Commonly known as: EMLA Apply 1 application topically as needed.   loperamide 1 MG/5ML solution Commonly known as: IMODIUM Take by mouth as needed for diarrhea or loose stools.   magnesium 30 MG tablet Take 30 mg by mouth 2 (two) times daily.   ondansetron 4 MG tablet Commonly known as: ZOFRAN TAKE 1 TABLET BY MOUTH EVERY 6 HOURS AS NEEDED FOR NAUSEA   oxyCODONE-acetaminophen 7.5-325 MG tablet Commonly known as: PERCOCET Take 1 tablet by mouth 4 (four) times daily as needed.   predniSONE 5 MG tablet Commonly known as: DELTASONE Take 1 tablet (5 mg total) by mouth daily with breakfast.   prochlorperazine 10 MG tablet Commonly known as: COMPAZINE Take 1 tablet (10 mg total) by mouth every 6 (six) hours as needed for nausea or vomiting.   rosuvastatin 10 MG tablet Commonly known as: CRESTOR Take 1 tablet (10 mg total) by mouth daily.   TIGER BALM ARTHRITIS RUB EX Apply 1 application topically 2 (two) times daily as needed (back pain).   [name] FlexTouch 100 UNIT/ML FlexTouch Pen Generic drug: insulin degludec Inject 10 Units into the skin at bedtime.   Ultram 50 MG tablet Generic drug: traMADol Take 50 mg by mouth every 6 (six) hours as needed for pain.       Allergies: No Known Allergies  Past Medical History:  Diagnosis Date  . Cancer [facility])    prostate  . Diabetes mellitus without complication ([place])   . Dyslipidemia   . Hypertension     Past Surgical History:  Procedure Laterality Date  . EYE SURGERY    . IR IMAGING GUIDED PORT INSERTION  06/12/2020    Family History  Problem Relation Age of Onset  . CAD Mother 56  . Other Father 62       age  . Other Brother 30       unknown cause    Social History:  reports that he has quit smoking. He has a 7.50 pack-year smoking history. He has never used smokeless tobacco. He reports previous alcohol use. He reports previous drug use.   Review of Systems  Constitutional: Positive for weight loss.        He has been progressively losing weight since his cancer diagnosis  Respiratory: Positive for shortness of breath.   Cardiovascular: Negative for chest pain and leg swelling.  Gastrointestinal: Positive for diarrhea. Negative for nausea.  Endocrine: Positive for fatigue.       History of  thyroid disease, TSH normal in 11/2019  Genitourinary: Positive for nocturia.       Has metastatic prostate cancer, last PSA 129  Musculoskeletal: Negative for joint pain.       Occasional back pain  Skin: Negative for rash.  Neurological: Positive for weakness and numbness.  Psychiatric/Behavioral: Negative for insomnia.     Lipid history: Has been treated with rosuvastatin by PCP    Lab Results  Component Value Date   [place] 84 12/09/2019           Hypertension: Has been present previously on enalapril, managed by PCP  BP Readings from Last 3 Encounters:  06/26/20 140/72  06/12/20 (!) 158/78  06/10/20 (!) 134/59    Most recent eye exam was in 2022  Most recent foot exam: 5/22  Currently known complications of diabetes: Erectile dysfunction  LABS:  Office Visit on 06/26/2020  Component Date Value Ref Range Status  . POC Glucose 06/26/2020 163* 70 - 99 mg/dl Final  . Hemoglobin A1C 06/26/2020 10.0* 4.0 - 5.6 % Final  . LDL Cholesterol 12/09/2019 84   Final    Physical Examination:  BP 140/72   Pulse 78   Ht 6' (1.829 m)   Wt 213 lb 12.8 oz (97 kg)   SpO2 97%   BMI 29.00 kg/m   GENERAL:         Patient has mild generalized obesity.    HEENT:         Eye exam shows normal external appearance.  Fundus exam  deferred to ophthalmologist Oral exam not indicated   NECK:   There is no lymphadenopathy   Thyroid is enlarged about 2-1/2 times normal, nodular and firm on the left only and no nodules felt on the right.   Carotids are normal to palpation and no bruit heard  LUNGS:         Chest is symmetrical. Lungs are clear to auscultation.Marland Kitchen   HEART:         Heart sounds:   S1 and S2 are normal. No murmur or click heard., no S3 or S4.   ABDOMEN:   There is no distention present. Liver and spleen are not palpable.  No other mass or tenderness present.    NEUROLOGICAL:   Ankle jerks are absent bilaterally.    Diabetic Foot Exam - Simple   Simple Foot Form Diabetic Foot exam was performed with the following findings: Yes   Visual Inspection No deformities, no ulcerations, no other skin breakdown bilaterally: Yes Sensation Testing Intact to touch and monofilament testing bilaterally: Yes Pulse Check See comments: Yes Comments Pedal pulses not palpable             Vibration sense is moderately reduced in distal first toes.  MUSCULOSKELETAL:  There is no swelling or deformity of the peripheral joints.     EXTREMITIES:     There is no ankle edema.   SKIN:       No rash or lesions of concern.        ASSESSMENT:  Diabetes type 2  See history of present illness for detailed discussion of current diabetes management, blood sugar patterns and problems identified  Most recent A1c is 10% and appears to be persistently poorly controlled  Current treatment regimen is basal bolus insulin However appears to be inadequate basal insulin with fasting readings reportedly consistently over 200 Also had some meals may not be getting enough mealtime insulin but difficult to assess since he did not  bring his freestyle libre reader for review History is also difficult to get accurately He has not had any diabetes education for several years and will benefit from nutritional counseling as well as education about insulin  Complications of diabetes: Mild peripheral neuropathy, erectile dysfunction, unknown status of retinopathy and nephropathy    PLAN:    . Glucose monitoring: Patient advised to check readings consistently up to 4 times a day either fasting or 2 hours after meals Discussed fasting and postprandial blood sugar targets  . Diabetes  education: Patient will need nutritional counseling and will refer  . Lifestyle changes: Dietary changes: Recommended cutting back on any high fat snacks   . Therapy changes: He will stop doing his insulin injections in his forearm and use upper arm on the sides TRESIBA will be adjusted based on fasting blood sugar every 3 days: He will start with 26 units tonight and go up 2 units every 3 days until morning sugars are consistently below 130 He will also be guided by his live-in companion and she understands the flowsheet provided with instructions  HUMALOG: He will take between 15-25 units based on the size of the meal, detailed instructions given To take right before eating May take additional 5 units if blood sugars are over 200 2 hours after meals Will adjust this further based on his blood sugars after meals  . Preventive care needed: Urine microalbumin, follow-up lipids  GOITER on the left side: May be multinodular goiter, but no previous history of this is available Previous thyroid levels have been reportedly normal Ultrasound will be ordered to evaluate, discussed that majority of thyroid nodules are benign and will consider biopsy only if he has a significantly suspicious nodule  Advanced prostate cancer: Followed by oncology  Follow-up: 4 weeks    Patient Instructions  Please check your blood sugar 4 times a day at least: Before each meal and 2 to 3 hours after dinner  Please bring the reader for the freestyle libre on each visit  Start with 26 units of Tresiba tonight and adjust every 3 days as on the flowsheet provided  Take the Humalog before each meal based on how much you eating:  For small meals take 15 units, for average meals with 1 or 2 starches take 20 units For eating out, any high fat meals like pizza and larger meals take 25 units  If the blood sugar is over 200 before eating you can add another 5 units  Try to eat all sugar-free desserts and limit  portions of fatty snacks    Consultation note has been sent to the referring physician  [name] 06/26/2020, 9:10 PM   Note: This office note was prepared with Dragon voice recognition system technology. Any transcriptional errors that result from this process are unintentional.

## 2020-06-26 ENCOUNTER — Other Ambulatory Visit: Payer: Self-pay

## 2020-06-26 ENCOUNTER — Encounter: Payer: Self-pay | Admitting: Endocrinology

## 2020-06-26 ENCOUNTER — Ambulatory Visit (INDEPENDENT_AMBULATORY_CARE_PROVIDER_SITE_OTHER): Payer: Medicare Other | Admitting: Endocrinology

## 2020-06-26 VITALS — BP 140/72 | HR 78 | Ht 72.0 in | Wt 213.8 lb

## 2020-06-26 DIAGNOSIS — E041 Nontoxic single thyroid nodule: Secondary | ICD-10-CM

## 2020-06-26 DIAGNOSIS — E1142 Type 2 diabetes mellitus with diabetic polyneuropathy: Secondary | ICD-10-CM | POA: Diagnosis not present

## 2020-06-26 DIAGNOSIS — I10 Essential (primary) hypertension: Secondary | ICD-10-CM | POA: Diagnosis not present

## 2020-06-26 DIAGNOSIS — E1165 Type 2 diabetes mellitus with hyperglycemia: Secondary | ICD-10-CM | POA: Diagnosis not present

## 2020-06-26 DIAGNOSIS — Z794 Long term (current) use of insulin: Secondary | ICD-10-CM | POA: Diagnosis not present

## 2020-06-26 LAB — POCT GLYCOSYLATED HEMOGLOBIN (HGB A1C): Hemoglobin A1C: 10 % — AB (ref 4.0–5.6)

## 2020-06-26 LAB — GLUCOSE, POCT (MANUAL RESULT ENTRY): POC Glucose: 163 mg/dl — AB (ref 70–99)

## 2020-06-26 NOTE — Patient Instructions (Addendum)
Please check your blood sugar 4 times a day at least: Before each meal and 2 to 3 hours after dinner  Please bring the reader for the freestyle libre on each visit  Start with 26 units of Tresiba tonight and adjust every 3 days as on the flowsheet provided  Take the Humalog before each meal based on how much you eating:  For small meals take 15 units, for average meals with 1 or 2 starches take 20 units For eating out, any high fat meals like pizza and larger meals take 25 units  If the blood sugar is over 200 before eating you can add another 5 units  Try to eat all sugar-free desserts and limit portions of fatty snacks

## 2020-06-28 ENCOUNTER — Encounter: Payer: Self-pay | Admitting: Oncology

## 2020-06-28 NOTE — Progress Notes (Signed)
Called pt to introduce myself as his Arboriculturist and to discuss [employer] and [employer].  Pt has 2 insurances so copay assistance shouldn't be needed.  I left a msg requesting he return my call if he's interested in applying for the grants.

## 2020-06-29 ENCOUNTER — Inpatient Hospital Stay (HOSPITAL_BASED_OUTPATIENT_CLINIC_OR_DEPARTMENT_OTHER): Payer: Medicare Other | Admitting: Oncology

## 2020-06-29 ENCOUNTER — Inpatient Hospital Stay: Payer: Medicare Other | Attending: Oncology

## 2020-06-29 ENCOUNTER — Inpatient Hospital Stay: Payer: Medicare Other

## 2020-06-29 ENCOUNTER — Other Ambulatory Visit: Payer: Self-pay | Admitting: *Deleted

## 2020-06-29 ENCOUNTER — Encounter: Payer: Self-pay | Admitting: Oncology

## 2020-06-29 ENCOUNTER — Other Ambulatory Visit: Payer: Self-pay

## 2020-06-29 VITALS — BP 147/69 | HR 74 | Temp 97.4°F | Resp 18 | Ht 72.0 in | Wt 216.1 lb

## 2020-06-29 DIAGNOSIS — C61 Malignant neoplasm of prostate: Secondary | ICD-10-CM

## 2020-06-29 DIAGNOSIS — Z5111 Encounter for antineoplastic chemotherapy: Secondary | ICD-10-CM | POA: Diagnosis not present

## 2020-06-29 DIAGNOSIS — Z79899 Other long term (current) drug therapy: Secondary | ICD-10-CM | POA: Insufficient documentation

## 2020-06-29 DIAGNOSIS — Z191 Hormone sensitive malignancy status: Secondary | ICD-10-CM | POA: Diagnosis not present

## 2020-06-29 DIAGNOSIS — C7951 Secondary malignant neoplasm of bone: Secondary | ICD-10-CM | POA: Insufficient documentation

## 2020-06-29 DIAGNOSIS — Z5189 Encounter for other specified aftercare: Secondary | ICD-10-CM | POA: Insufficient documentation

## 2020-06-29 DIAGNOSIS — Z95828 Presence of other vascular implants and grafts: Secondary | ICD-10-CM

## 2020-06-29 LAB — CBC WITH DIFFERENTIAL (CANCER CENTER ONLY)
Abs Immature Granulocytes: 0.22 10*3/uL — ABNORMAL HIGH (ref 0.00–0.07)
Basophils Absolute: 0 10*3/uL (ref 0.0–0.1)
Basophils Relative: 0 %
Eosinophils Absolute: 0.1 10*3/uL (ref 0.0–0.5)
Eosinophils Relative: 1 %
HCT: 26.3 % — ABNORMAL LOW (ref 39.0–52.0)
Hemoglobin: 8.3 g/dL — ABNORMAL LOW (ref 13.0–17.0)
Immature Granulocytes: 3 %
Lymphocytes Relative: 26 %
Lymphs Abs: 1.9 10*3/uL (ref 0.7–4.0)
MCH: 28.3 pg (ref 26.0–34.0)
MCHC: 31.6 g/dL (ref 30.0–36.0)
MCV: 89.8 fL (ref 80.0–100.0)
Monocytes Absolute: 0.7 10*3/uL (ref 0.1–1.0)
Monocytes Relative: 10 %
Neutro Abs: 4.4 10*3/uL (ref 1.7–7.7)
Neutrophils Relative %: 60 %
Platelet Count: 171 10*3/uL (ref 150–400)
RBC: 2.93 MIL/uL — ABNORMAL LOW (ref 4.22–5.81)
RDW: 20.5 % — ABNORMAL HIGH (ref 11.5–15.5)
WBC Count: 7.4 10*3/uL (ref 4.0–10.5)
nRBC: 1.5 % — ABNORMAL HIGH (ref 0.0–0.2)

## 2020-06-29 LAB — CMP (CANCER CENTER ONLY)
ALT: 6 U/L (ref 0–44)
AST: 13 U/L — ABNORMAL LOW (ref 15–41)
Albumin: 3.2 g/dL — ABNORMAL LOW (ref 3.5–5.0)
Alkaline Phosphatase: 804 U/L — ABNORMAL HIGH (ref 38–126)
Anion gap: 8 (ref 5–15)
BUN: 15 mg/dL (ref 8–23)
CO2: 27 mmol/L (ref 22–32)
Calcium: 8.9 mg/dL (ref 8.9–10.3)
Chloride: 104 mmol/L (ref 98–111)
Creatinine: 0.71 mg/dL (ref 0.61–1.24)
GFR, Estimated: >60 mL/min (ref >60)
Glucose, Bld: 135 mg/dL — ABNORMAL HIGH (ref 70–99)
Potassium: 3.9 mmol/L (ref 3.5–5.1)
Sodium: 139 mmol/L (ref 135–145)
Total Bilirubin: 0.3 mg/dL (ref 0.3–1.2)
Total Protein: 7.3 g/dL (ref 6.5–8.1)

## 2020-06-29 MED ORDER — SODIUM CHLORIDE 0.9% FLUSH
10.0000 mL | INTRAVENOUS | Status: DC | PRN
  Administered 2020-06-29: 10 mL
  Filled 2020-06-29: qty 10

## 2020-06-29 MED ORDER — HEPARIN SOD (PORK) LOCK FLUSH 100 UNIT/ML IV SOLN
500.0000 [IU] | Freq: Once | INTRAVENOUS | Status: AC | PRN
  Administered 2020-06-29: 500 [IU]
  Filled 2020-06-29: qty 5

## 2020-06-29 MED ORDER — SODIUM CHLORIDE 0.9 % IV SOLN
Freq: Once | INTRAVENOUS | Status: AC
  Filled 2020-06-29: qty 250

## 2020-06-29 MED ORDER — ACETAMINOPHEN 325 MG PO TABS
ORAL_TABLET | ORAL | Status: AC
  Filled 2020-06-29: qty 2

## 2020-06-29 MED ORDER — SODIUM CHLORIDE 0.9 % IV SOLN
10.0000 mg | Freq: Once | INTRAVENOUS | Status: AC
  Administered 2020-06-29: 10 mg via INTRAVENOUS
  Filled 2020-06-29: qty 10

## 2020-06-29 MED ORDER — SODIUM CHLORIDE 0.9% FLUSH
10.0000 mL | INTRAVENOUS | Status: DC | PRN
  Administered 2020-06-29: 10 mL via INTRAVENOUS
  Filled 2020-06-29: qty 10

## 2020-06-29 MED ORDER — SODIUM CHLORIDE 0.9 % IV SOLN
75.0000 mg/m2 | Freq: Once | INTRAVENOUS | Status: AC
  Administered 2020-06-29: 170 mg via INTRAVENOUS
  Filled 2020-06-29: qty 17

## 2020-06-29 MED ORDER — ACETAMINOPHEN 325 MG PO TABS
650.0000 mg | ORAL_TABLET | Freq: Once | ORAL | Status: AC
Start: 2020-06-29 — End: 2020-06-29
  Administered 2020-06-29: 650 mg via ORAL

## 2020-06-29 NOTE — Progress Notes (Signed)
Hematology and Oncology Follow Up Visit  Stephen Lynch [id number] 28-Apr-1945 75 y.o. 06/29/2020 10:44 AM Stephen Lynch, [name], Stephen Rude, MD   Principle Diagnosis: 7 year old man with castration-resistant advanced prostate cancer with disease to the bone diagnosed in February 2021 with a PSA of 193.    Prior Therapy:  He status post CT-guided biopsy of the right iliac bone.  [name] started on April 20, 2019.  Xtandi 160 mg daily started in April 2021.  Therapy discontinued in March 2022 due to progression of disease.  Zytiga 1000 mg daily with prednisone 5 mg daily started in March 2022.   Current therapy:    Eligard 30 mg started in April 2021.  His next Eligard will be in July 2022.  Taxotere chemotherapy 75 mg per metered square started on June 08, 2020.  He is here for cycle 2 of therapy.   Interim History: [name] presents today for a follow-up evaluation.  Since the last visit, he received the first cycle of Taxotere chemotherapy.  He tolerated therapy very well without any major complications.  He did notice improvement in his overall health and stamina.  He denies any nausea, vomiting or abdominal pain.  He denies any worsening neuropathy.  He noticed improvement in his pain and overall quality of life.  He is eating better and gained 2 pounds.  Medications: Unchanged on review. Current Outpatient Medications  Medication Sig Dispense Refill  . acetaminophen (TYLENOL) 500 MG tablet Take 500 mg by mouth every 6 (six) hours as needed for mild pain.    . ASPIRIN 81 PO Take 81 mg by mouth daily.     . Calcium-Magnesium-Zinc 333-133-5 MG TABS Take 1 tablet by mouth daily.    . Chromium 1 MG CAPS Take by mouth.    . Continuous Blood Gluc Sensor (FREESTYLE LIBRE 2 SENSOR) MISC APPLY EVERY 14 DAYS, CHECK GLUCOSE BEFORE EACH MEAL, 2 HRS AFTER, IN THE MORNING AND BEFORE BEDTIME    . doxycycline (VIBRA-TABS) 100 MG tablet     . DULoxetine (CYMBALTA) 60 MG capsule Take 60 mg by  mouth daily.    Marland Kitchen ibuprofen (ADVIL) 200 MG tablet Take 200 mg by mouth every 6 (six) hours as needed for moderate pain.    Marland Kitchen insulin lispro (HUMALOG) 100 UNIT/ML KwikPen Junior Inject 0.2 mLs (20 Units total) into the skin 3 (three) times daily after meals. (Patient taking differently: Inject 7-25 Units into the skin 3 (three) times daily after meals. Per sliding scale) 3 mL 6  . lidocaine (LIDODERM) 5 % Place 2 patches onto the skin daily. Remove & Discard patch within 12 hours or as directed by MD 30 patch 0  . lidocaine-prilocaine (EMLA) cream Apply 1 application topically as needed. 30 g 0  . loperamide (IMODIUM) 1 MG/5ML solution Take by mouth as needed for diarrhea or loose stools.    . magnesium 30 MG tablet Take 30 mg by mouth 2 (two) times daily.    . Menthol, Topical Analgesic, (BIOFREEZE EX) Apply topically.    . Menthol-Camphor (TIGER BALM ARTHRITIS RUB EX) Apply 1 application topically 2 (two) times daily as needed (back pain).    . ondansetron (ZOFRAN) 4 MG tablet TAKE 1 TABLET BY MOUTH EVERY 6 HOURS AS NEEDED FOR NAUSEA 20 tablet 0  . oxyCODONE-acetaminophen (PERCOCET) 7.5-325 MG tablet Take 1 tablet by mouth 4 (four) times daily as needed. 60 tablet 0  . predniSONE (DELTASONE) 5 MG tablet Take 1 tablet (5 mg total) by mouth daily  with breakfast. 90 tablet 3  . prochlorperazine (COMPAZINE) 10 MG tablet Take 1 tablet (10 mg total) by mouth every 6 (six) hours as needed for nausea or vomiting. 30 tablet 0  . rosuvastatin (CRESTOR) 10 MG tablet Take 1 tablet (10 mg total) by mouth daily. 90 tablet 3  . TRESIBA FLEXTOUCH 100 UNIT/ML SOPN FlexTouch Pen Inject 10 Units into the skin at bedtime.     Marland Kitchen ULTRAM 50 MG tablet Take 50 mg by mouth every 6 (six) hours as needed for pain.     No current facility-administered medications for this visit.     Allergies: No Known Allergies    Physical Exam:  Blood pressure (!) 147/69, pulse 74, temperature (!) 97.4 F (36.3 C), temperature  source Tympanic, resp. rate 18, height 6' (1.829 m), weight 216 lb 1.6 oz (98 kg), SpO2 100 %.     ECOG: 1     General appearance: Alert, awake without any distress. Head: Atraumatic without abnormalities Oropharynx: Without any thrush or ulcers. Eyes: No scleral icterus. Lymph nodes: No lymphadenopathy noted in the cervical, supraclavicular, or axillary nodes Heart:regular rate and rhythm, without any murmurs or gallops.   Lung: Clear to auscultation without any rhonchi, wheezes or dullness to percussion. Abdomin: Soft, nontender without any shifting dullness or ascites. Musculoskeletal: No clubbing or cyanosis. Neurological: No motor or sensory deficits. Skin: No rashes or lesions.              Lab Results: Lab Results  Component Value Date   WBC 8.6 06/08/2020   HGB 8.7 (L) 06/08/2020   HCT 27.2 (L) 06/08/2020   MCV 88.3 06/08/2020   PLT 370 06/08/2020     Chemistry      Component Value Date/Time   NA 137 06/08/2020 1228   K 3.7 06/08/2020 1228   CL 98 06/08/2020 1228   CO2 29 06/08/2020 1228   BUN 10 06/08/2020 1228   CREATININE 0.76 06/08/2020 1228      Component Value Date/Time   CALCIUM 8.7 (L) 06/08/2020 1228   ALKPHOS 480 (H) 06/08/2020 1228   AST 16 06/08/2020 1228   ALT 11 06/08/2020 1228   BILITOT 0.3 06/08/2020 1228     Results for TRIPTON, NED (MRN [id number]) as of 06/29/2020 10:45  Ref. Range 05/24/2020 13:42 06/08/2020 12:28  Prostate Specific Ag, Serum Latest Ref Range: 0.0 - 4.0 ng/mL 142.0 (H) 129.0 (H)       Impression and Plan:   75 year old man with:  1.    Advanced prostate cancer with disease to the bone diagnosed in February 2022.  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  He is currently on Taxotere chemotherapy and completed cycle 1.  Risks and benefits of proceeding with cycle 2 were discussed.  Complications that include nausea, vomiting, mild suppression, neuropathy as well as infusion related  complications were reiterated.  He is benefiting clinically at this time and agreeable to continue.  2.  Androgen deprivation: I recommended continuing this indefinitely and he will receive that in July 2022.  3.  Bone directed therapy: I recommended calcium and vitamin D supplements with Xgeva deferred till his dental clearance is completed.   4.  Prognosis and goals of care: His therapy is palliative and disease is incurable.  His performance status is reasonable to continue to consider aggressive measures.  5.  Anemia: Related to malignancy and chemotherapy.  Hemoglobin currently stable at 8.3.  We will consider packed red cell transfusion if hemoglobin drops  further.  6.  Bone pain: Manageable at this time with oxycodone.  He uses that infrequently close to once a day.  7.  IV access: Port-A-Cath inserted without any complications and will continue to be in use.   8.  Follow-up: In 3 weeks for next cycle of therapy.   30  minutes were spent on this encounter.  The time was dedicated to reviewing disease status, discussing complications related to his cancer and cancer therapy.   Zola Button, MD 5/19/202210:44 AM

## 2020-06-29 NOTE — Patient Outreach (Signed)
[facility]) Care Management  06/29/2020  Ronnald Shedden 12/25/1945 [id number]   Outgoing call placed to member/sifnificant other, state this is not a good time to talk as they are on the way to cancer center for treatment.  Will call back within the next 3-4 business days.  [name], [place], MSN [place] [phone]

## 2020-06-29 NOTE — Progress Notes (Signed)
Pt returned my call and would like to apply for the [employer] Pro[employer] grants so he will bring his proof of income next week.  If approved I will give him an expense sheet and my card for any questions or concerns he may have in the future.

## 2020-06-29 NOTE — Patient Instructions (Signed)
[facility]COL[facility]   ?Discharge Instructions: ?Thank you for choosing [facility] to provide your oncology and hematology care.  ? ?If you have a lab appointment with the Cancer Center, please go directly to the Cancer Center and check in at the registration area. ?  ?Wear comfortable clothing and clothing appropriate for easy access to any Portacath or PICC line.  ? ?We strive to give you quality time with your provider. You may need to reschedule your appointment if you arrive late (15 or more minutes).  Arriving late affects you and other patients whose appointments are after yours.  Also, if you miss three or more appointments without notifying the office, you may be dismissed from the clinic at the provider?s discretion.    ?  ?For prescription refill requests, have your pharmacy contact our office and allow 72 hours for refills to be completed.   ? ?Today you received the following chemotherapy and/or immunotherapy agents: Docetaxel (Taxotere)    ?  ?To help prevent nausea and vomiting after your treatment, we encourage you to take your nausea medication as directed. ? ?BELOW ARE SYMPTOMS THAT SHOULD BE REPORTED IMMEDIATELY: ?*FEVER GREATER THAN 100.4 F (38 ?C) OR HIGHER ?*CHILLS OR SWEATING ?*NAUSEA AND VOMITING THAT IS NOT CONTROLLED WITH YOUR NAUSEA MEDICATION ?*UNUSUAL SHORTNESS OF BREATH ?*UNUSUAL BRUISING OR BLEEDING ?*URINARY PROBLEMS (pain or burning when urinating, or frequent urination) ?*BOWEL PROBLEMS (unusual diarrhea, constipation, pain near the anus) ?TENDERNESS IN MOUTH AND THROAT WITH OR WITHOUT PRESENCE OF ULCERS (sore throat, sores in mouth, or a toothache) ?UNUSUAL RASH, SWELLING OR PAIN  ?UNUSUAL VAGINAL DISCHARGE OR ITCHING  ? ?Items with * indicate a potential emergency and should be followed up as soon as possible or go to the Emergency Department if any problems should occur. ? ?Please show the CHEMOTHERAPY ALERT CARD or IMMUNOTHERAPY ALERT CARD at  check-in to the Emergency Department and triage nurse. ? ?Should you have questions after your visit or need to cancel or reschedule your appointment, please contact [facility]  Dept: [phone]  and follow the prompts.  Office hours are 8:00 a.m. to 4:30 p.m. Monday - Friday. Please note that voicemails left after 4:00 p.m. may not be returned until the following business day.  We are closed weekends and major holidays. You have access to a nurse at all times for urgent questions. Please call the main number to the clinic Dept: [phone] and follow the prompts. ? ? ?For any non-urgent questions, you may also contact your provider using MyChart. We now offer e-Visits for anyone 18 and older to request care online for non-urgent symptoms. For details visit [email or web address]. ?  ?Also download the MyChart app! Go to the app store, search "MyChart", open the app, select [facility], and log in with your MyChart username and password. ? ?Due to Covid, a mask is required upon entering the hospital/clinic. If you do not have a mask, one will be given to you upon arrival. For doctor visits, patients may have 1 support person aged 18 or older with them. For treatment visits, patients cannot have anyone with them due to current Covid guidelines and our immunocompromised population.  ? ?

## 2020-06-30 ENCOUNTER — Telehealth: Payer: Self-pay | Admitting: *Deleted

## 2020-06-30 LAB — PROSTATE-SPECIFIC AG, SERUM (LABCORP): Prostate Specific Ag, Serum: 75.9 ng/mL — ABNORMAL HIGH (ref 0.0–4.0)

## 2020-06-30 NOTE — Telephone Encounter (Signed)
-----   Message from [name], MD sent at 06/30/2020  8:27 AM EDT ----- Please let him know his PSA is down

## 2020-06-30 NOTE — Telephone Encounter (Signed)
PC to patient, spoke with his wife [name], informed her his PSA is 75.9   She verbalizes understanding.  Patient also called back, he was informed of same, verbalizes understanding.

## 2020-07-01 ENCOUNTER — Other Ambulatory Visit: Payer: Self-pay

## 2020-07-01 ENCOUNTER — Inpatient Hospital Stay: Payer: Medicare Other

## 2020-07-01 VITALS — BP 136/50 | HR 74 | Temp 97.5°F | Resp 17

## 2020-07-01 DIAGNOSIS — C7951 Secondary malignant neoplasm of bone: Secondary | ICD-10-CM | POA: Diagnosis not present

## 2020-07-01 DIAGNOSIS — Z191 Hormone sensitive malignancy status: Secondary | ICD-10-CM | POA: Diagnosis not present

## 2020-07-01 DIAGNOSIS — C61 Malignant neoplasm of prostate: Secondary | ICD-10-CM | POA: Diagnosis not present

## 2020-07-01 DIAGNOSIS — Z5189 Encounter for other specified aftercare: Secondary | ICD-10-CM | POA: Diagnosis not present

## 2020-07-01 DIAGNOSIS — Z5111 Encounter for antineoplastic chemotherapy: Secondary | ICD-10-CM | POA: Diagnosis not present

## 2020-07-01 DIAGNOSIS — Z79899 Other long term (current) drug therapy: Secondary | ICD-10-CM | POA: Diagnosis not present

## 2020-07-01 MED ORDER — PEGFILGRASTIM-JMDB 6 MG/0.6ML ~~LOC~~ SOSY
6.0000 mg | PREFILLED_SYRINGE | Freq: Once | SUBCUTANEOUS | Status: AC
Start: 2020-07-01 — End: 2020-07-01
  Administered 2020-07-01: 6 mg via SUBCUTANEOUS

## 2020-07-01 NOTE — Patient Instructions (Signed)
Pegfilgrastim injection What is this medicine? PEGFILGRASTIM (PEG fil gra stim) is a long-acting granulocyte colony-stimulating factor that stimulates the growth of neutrophils, a type of white blood cell important in the body's fight against infection. It is used to reduce the incidence of fever and infection in patients with certain types of cancer who are receiving chemotherapy that affects the bone marrow, and to increase survival after being exposed to high doses of radiation. This medicine may be used for other purposes; ask your health care provider or pharmacist if you have questions. COMMON BRAND NAME(S): Fulphila, Neulasta, Nyvepria, UDENYCA, Ziextenzo What should I tell my health care provider before I take this medicine? They need to know if you have any of these conditions:  kidney disease  latex allergy  ongoing radiation therapy  sickle cell disease  skin reactions to acrylic adhesives (On-Body Injector only)  an unusual or allergic reaction to pegfilgrastim, filgrastim, other medicines, foods, dyes, or preservatives  pregnant or trying to get pregnant  breast-feeding How should I use this medicine? This medicine is for injection under the skin. If you get this medicine at home, you will be taught how to prepare and give the pre-filled syringe or how to use the On-body Injector. Refer to the patient Instructions for Use for detailed instructions. Use exactly as directed. Tell your healthcare provider immediately if you suspect that the On-body Injector may not have performed as intended or if you suspect the use of the On-body Injector resulted in a missed or partial dose. It is important that you put your used needles and syringes in a special sharps container. Do not put them in a trash can. If you do not have a sharps container, call your pharmacist or healthcare provider to get one. Talk to your pediatrician regarding the use of this medicine in children. While this drug  may be prescribed for selected conditions, precautions do apply. Overdosage: If you think you have taken too much of this medicine contact a poison control center or emergency room at once. NOTE: This medicine is only for you. Do not share this medicine with others. What if I miss a dose? It is important not to miss your dose. Call your doctor or health care professional if you miss your dose. If you miss a dose due to an On-body Injector failure or leakage, a new dose should be administered as soon as possible using a single prefilled syringe for manual use. What may interact with this medicine? Interactions have not been studied. This list may not describe all possible interactions. Give your health care provider a list of all the medicines, herbs, non-prescription drugs, or dietary supplements you use. Also tell them if you smoke, drink alcohol, or use illegal drugs. Some items may interact with your medicine. What should I watch for while using this medicine? Your condition will be monitored carefully while you are receiving this medicine. You may need blood work done while you are taking this medicine. Talk to your health care provider about your risk of cancer. You may be more at risk for certain types of cancer if you take this medicine. If you are going to need a MRI, CT scan, or other procedure, tell your doctor that you are using this medicine (On-Body Injector only). What side effects may I notice from receiving this medicine? Side effects that you should report to your doctor or health care professional as soon as possible:  allergic reactions (skin rash, itching or hives, swelling of   the face, lips, or tongue)  back pain  dizziness  fever  pain, redness, or irritation at site where injected  pinpoint red spots on the skin  red or dark-brown urine  shortness of breath or breathing problems  stomach or side pain, or pain at the shoulder  swelling  tiredness  trouble  passing urine or change in the amount of urine  unusual bruising or bleeding Side effects that usually do not require medical attention (report to your doctor or health care professional if they continue or are bothersome):  bone pain  muscle pain This list may not describe all possible side effects. Call your doctor for medical advice about side effects. You may report side effects to FDA at 1-800-FDA-1088. Where should I keep my medicine? Keep out of the reach of children. If you are using this medicine at home, you will be instructed on how to store it. Throw away any unused medicine after the expiration date on the label. NOTE: This sheet is a summary. It may not cover all possible information. If you have questions about this medicine, talk to your doctor, pharmacist, or health care provider.  2021 Elsevier/Gold Standard (2019-02-19 13:20:51)  

## 2020-07-03 ENCOUNTER — Encounter: Payer: Self-pay | Admitting: Oncology

## 2020-07-03 NOTE — Progress Notes (Signed)
Pt is approved for the $1000 Alight and $200 Prostate grants.

## 2020-07-05 ENCOUNTER — Other Ambulatory Visit: Payer: Self-pay | Admitting: *Deleted

## 2020-07-05 NOTE — Patient Outreach (Signed)
[facility]) Care Management  07/05/2020  Greggory Safranek 1945/04/12 [id number]   Outreach attempt #2, successful.  Girlfriend report his pain has decreased since restarting chemo, PSA decreased from142 in April to 75.9 last on 5/19.  Denies any urgent concerns, encouraged to contact this care manager with questions.  Agrees to follow up within the next month.  Goals Addressed            This Visit's Progress   . [facility] - Make and Keep All Appointments   On track      Timeframe:  Short-Term Goal Priority:  Medium Start Date:      4/21               Expected End Date:   5/21              - ask family or friend for a ride - call to cancel if needed - keep a calendar with appointment dates    Why is this important?   Part of staying healthy is seeing the doctor for follow-up care.  If you forget your appointments, there are some things you can do to stay on track.    Notes: Encouraged to call endocrinologist  12/9 - missed PCP appointment, advised of importance of calling to reschedule  2/15 - Call placed to PCP office to inquire about next office visit  4/21 - Girlfriend report member's cancer has gotten worse, will start chemo next week.  Will have port placed next Monday.  5/25 - Remains active with cancer center, several appointments per week.  Will see endocrinologist on 6/13    . [facility] - Monitor and Manage My Blood Sugar   On track      Timeframe:  Short-Term Goal Priority:  High Start Date:   4/21                     Expected End Date:  5/21                  - check blood sugar at prescribed times - check blood sugar before and after exercise - take the blood sugar log to all doctor visits - take the blood sugar meter to all doctor visits    Why is this important?   Checking your blood sugar at home helps to keep it from getting very high or very low.  Writing the results in a diary or log helps the doctor know how to care for you.  Your blood  sugar log should have the time, date and the results.  Also, write down the amount of insulin or other medicine that you take.  Other information, like what you ate, exercise done and how you were feeling, will also be helpful.     Notes:   12/9 - Reminded of diabetic diet and encouraged to continue medication adherence  2/15 - Reviewed medication changes and importance of checking blood sugars to decrease risk of hypoglycemic episodes  4/21 - Girlfriend aware of importance or managing blood sugars in addition to cancer treatments  5/25 - Girlfriend report blood sugars have remained less than 200 but not at 130 with fasting as recommended.  He is on his last day of 28 units, will increase to 30 units tomorrow per order if glucose remains greater then 130    . [facility] - Set My Target A1C   On track      Timeframe:  Long-Range Goal Priority:  Medium Start Date:       2/15                      Expected End Date:  5/15                       - set target A1C less than 8    Why is this important?   Your target A1C is decided together by you and your doctor.  It is based on several things like your age and other health issues.    Notes:   12/9 - Encouraged again to contact new endocrinologist to schedule appointment  2/15 - Call place to PCP office to request A1C  4/21 - Appointment scheduled with Dr. Dwyane Dee for 5/16  5/25 - Reminded member/girlfriend of proper diabetic management.  Encouraged to stop late night/over night snacking.        [name], [place], MSN [place] [phone]

## 2020-07-06 ENCOUNTER — Other Ambulatory Visit: Payer: Medicare Other

## 2020-07-06 ENCOUNTER — Ambulatory Visit: Payer: Medicare Other | Admitting: Oncology

## 2020-07-18 ENCOUNTER — Other Ambulatory Visit: Payer: Self-pay

## 2020-07-18 ENCOUNTER — Other Ambulatory Visit (INDEPENDENT_AMBULATORY_CARE_PROVIDER_SITE_OTHER): Payer: Medicare Other

## 2020-07-18 DIAGNOSIS — E041 Nontoxic single thyroid nodule: Secondary | ICD-10-CM

## 2020-07-18 DIAGNOSIS — E1142 Type 2 diabetes mellitus with diabetic polyneuropathy: Secondary | ICD-10-CM

## 2020-07-18 LAB — TSH: TSH: 0.76 u[IU]/mL (ref 0.35–4.50)

## 2020-07-18 LAB — T4, FREE: Free T4: 1.07 ng/dL (ref 0.60–1.60)

## 2020-07-19 ENCOUNTER — Encounter: Payer: Self-pay | Admitting: Oncology

## 2020-07-19 LAB — BASIC METABOLIC PANEL
BUN: 18 mg/dL (ref 6–23)
CO2: 26 mEq/L (ref 19–32)
Calcium: 8.7 mg/dL (ref 8.4–10.5)
Chloride: 105 mEq/L (ref 96–112)
Creatinine, Ser: 0.82 mg/dL (ref 0.40–1.50)
GFR: 87.17 mL/min (ref >60.00)
Glucose, Bld: 128 mg/dL — ABNORMAL HIGH (ref 70–99)
Potassium: 4.5 mEq/L (ref 3.5–5.1)
Sodium: 141 mEq/L (ref 135–145)

## 2020-07-19 LAB — LIPID PANEL
Cholesterol: 142 mg/dL (ref 0–200)
HDL: 53.8 mg/dL (ref >39.00)
LDL Cholesterol: 72 mg/dL (ref 0–99)
NonHDL: 88.46
Total CHOL/HDL Ratio: 3
Triglycerides: 80 mg/dL (ref 0.0–149.0)
VLDL: 16 mg/dL (ref 0.0–40.0)

## 2020-07-20 ENCOUNTER — Other Ambulatory Visit: Payer: Self-pay

## 2020-07-20 ENCOUNTER — Inpatient Hospital Stay: Payer: Medicare Other | Attending: Oncology

## 2020-07-20 ENCOUNTER — Inpatient Hospital Stay (HOSPITAL_BASED_OUTPATIENT_CLINIC_OR_DEPARTMENT_OTHER): Payer: Medicare Other | Admitting: Oncology

## 2020-07-20 ENCOUNTER — Inpatient Hospital Stay: Payer: Medicare Other

## 2020-07-20 VITALS — BP 177/81 | HR 64 | Temp 98.2°F | Resp 17 | Wt 223.9 lb

## 2020-07-20 DIAGNOSIS — D630 Anemia in neoplastic disease: Secondary | ICD-10-CM | POA: Insufficient documentation

## 2020-07-20 DIAGNOSIS — D6481 Anemia due to antineoplastic chemotherapy: Secondary | ICD-10-CM | POA: Insufficient documentation

## 2020-07-20 DIAGNOSIS — C7951 Secondary malignant neoplasm of bone: Secondary | ICD-10-CM | POA: Diagnosis not present

## 2020-07-20 DIAGNOSIS — C61 Malignant neoplasm of prostate: Secondary | ICD-10-CM

## 2020-07-20 DIAGNOSIS — Z5111 Encounter for antineoplastic chemotherapy: Secondary | ICD-10-CM | POA: Diagnosis not present

## 2020-07-20 DIAGNOSIS — Z95828 Presence of other vascular implants and grafts: Secondary | ICD-10-CM

## 2020-07-20 DIAGNOSIS — Z79899 Other long term (current) drug therapy: Secondary | ICD-10-CM | POA: Diagnosis not present

## 2020-07-20 DIAGNOSIS — Z192 Hormone resistant malignancy status: Secondary | ICD-10-CM | POA: Diagnosis not present

## 2020-07-20 DIAGNOSIS — T451X5A Adverse effect of antineoplastic and immunosuppressive drugs, initial encounter: Secondary | ICD-10-CM | POA: Insufficient documentation

## 2020-07-20 LAB — CBC WITH DIFFERENTIAL (CANCER CENTER ONLY)
Abs Immature Granulocytes: 0.2 10*3/uL — ABNORMAL HIGH (ref 0.00–0.07)
Basophils Absolute: 0 10*3/uL (ref 0.0–0.1)
Basophils Relative: 1 %
Eosinophils Absolute: 0 10*3/uL (ref 0.0–0.5)
Eosinophils Relative: 1 %
HCT: 25.4 % — ABNORMAL LOW (ref 39.0–52.0)
Hemoglobin: 8.1 g/dL — ABNORMAL LOW (ref 13.0–17.0)
Immature Granulocytes: 4 %
Lymphocytes Relative: 29 %
Lymphs Abs: 1.6 10*3/uL (ref 0.7–4.0)
MCH: 29.7 pg (ref 26.0–34.0)
MCHC: 31.9 g/dL (ref 30.0–36.0)
MCV: 93 fL (ref 80.0–100.0)
Monocytes Absolute: 0.5 10*3/uL (ref 0.1–1.0)
Monocytes Relative: 8 %
Neutro Abs: 3.1 10*3/uL (ref 1.7–7.7)
Neutrophils Relative %: 57 %
Platelet Count: 145 10*3/uL — ABNORMAL LOW (ref 150–400)
RBC: 2.73 MIL/uL — ABNORMAL LOW (ref 4.22–5.81)
RDW: 23.9 % — ABNORMAL HIGH (ref 11.5–15.5)
WBC Count: 5.5 10*3/uL (ref 4.0–10.5)
nRBC: 1.7 % — ABNORMAL HIGH (ref 0.0–0.2)

## 2020-07-20 LAB — CMP (CANCER CENTER ONLY)
ALT: 9 U/L (ref 0–44)
AST: 12 U/L — ABNORMAL LOW (ref 15–41)
Albumin: 3.6 g/dL (ref 3.5–5.0)
Alkaline Phosphatase: 428 U/L — ABNORMAL HIGH (ref 38–126)
Anion gap: 7 (ref 5–15)
BUN: 18 mg/dL (ref 8–23)
CO2: 27 mmol/L (ref 22–32)
Calcium: 9 mg/dL (ref 8.9–10.3)
Chloride: 106 mmol/L (ref 98–111)
Creatinine: 0.78 mg/dL (ref 0.61–1.24)
GFR, Estimated: >60 mL/min (ref >60)
Glucose, Bld: 199 mg/dL — ABNORMAL HIGH (ref 70–99)
Potassium: 4.6 mmol/L (ref 3.5–5.1)
Sodium: 140 mmol/L (ref 135–145)
Total Bilirubin: 0.3 mg/dL (ref 0.3–1.2)
Total Protein: 7 g/dL (ref 6.5–8.1)

## 2020-07-20 MED ORDER — SODIUM CHLORIDE 0.9% FLUSH
10.0000 mL | INTRAVENOUS | Status: DC | PRN
  Administered 2020-07-20: 10 mL via INTRAVENOUS
  Filled 2020-07-20: qty 10

## 2020-07-20 MED ORDER — SODIUM CHLORIDE 0.9 % IV SOLN
75.0000 mg/m2 | Freq: Once | INTRAVENOUS | Status: AC
  Administered 2020-07-20: 170 mg via INTRAVENOUS
  Filled 2020-07-20: qty 17

## 2020-07-20 MED ORDER — SODIUM CHLORIDE 0.9% FLUSH
10.0000 mL | INTRAVENOUS | Status: DC | PRN
  Administered 2020-07-20: 10 mL
  Filled 2020-07-20: qty 10

## 2020-07-20 MED ORDER — SODIUM CHLORIDE 0.9 % IV SOLN
Freq: Once | INTRAVENOUS | Status: AC
  Filled 2020-07-20: qty 250

## 2020-07-20 MED ORDER — OXYCODONE-ACETAMINOPHEN 7.5-325 MG PO TABS: 1.0000 | ORAL_TABLET | Freq: Four times a day (QID) | ORAL | 0 refills | Status: DC | PRN

## 2020-07-20 MED ORDER — SODIUM CHLORIDE 0.9 % IV SOLN
10.0000 mg | Freq: Once | INTRAVENOUS | Status: AC
  Administered 2020-07-20: 10 mg via INTRAVENOUS
  Filled 2020-07-20: qty 10

## 2020-07-20 MED ORDER — HEPARIN SOD (PORK) LOCK FLUSH 100 UNIT/ML IV SOLN
500.0000 [IU] | Freq: Once | INTRAVENOUS | Status: AC | PRN
  Administered 2020-07-20: 500 [IU]
  Filled 2020-07-20: qty 5

## 2020-07-20 MED ORDER — PROCHLORPERAZINE MALEATE 10 MG PO TABS: 10.0000 mg | ORAL_TABLET | Freq: Four times a day (QID) | ORAL | 0 refills | Status: DC | PRN

## 2020-07-20 NOTE — Patient Instructions (Signed)
[facility] MEDICAL [facility]COLOG[facility]   ?Discharge Instructions: ?Thank you for choosing [facility] to provide your oncology and hematology care.  ? ?If you have a lab appointment with the Cancer Center, please go directly to the Cancer Center and check in at the registration area. ?  ?Wear comfortable clothing and clothing appropriate for easy access to any Portacath or PICC line.  ? ?We strive to give you quality time with your provider. You may need to reschedule your appointment if you arrive late (15 or more minutes).  Arriving late affects you and other patients whose appointments are after yours.  Also, if you miss three or more appointments without notifying the office, you may be dismissed from the clinic at the provider?s discretion.    ?  ?For prescription refill requests, have your pharmacy contact our office and allow 72 hours for refills to be completed.   ? ?Today you received the following chemotherapy and/or immunotherapy agents: Docetaxel (Taxotere)    ?  ?To help prevent nausea and vomiting after your treatment, we encourage you to take your nausea medication as directed. ? ?BELOW ARE SYMPTOMS THAT SHOULD BE REPORTED IMMEDIATELY: ?*FEVER GREATER THAN 100.4 F (38 ?C) OR HIGHER ?*CHILLS OR SWEATING ?*NAUSEA AND VOMITING THAT IS NOT CONTROLLED WITH YOUR NAUSEA MEDICATION ?*UNUSUAL SHORTNESS OF BREATH ?*UNUSUAL BRUISING OR BLEEDING ?*URINARY PROBLEMS (pain or burning when urinating, or frequent urination) ?*BOWEL PROBLEMS (unusual diarrhea, constipation, pain near the anus) ?TENDERNESS IN MOUTH AND THROAT WITH OR WITHOUT PRESENCE OF ULCERS (sore throat, sores in mouth, or a toothache) ?UNUSUAL RASH, SWELLING OR PAIN  ?UNUSUAL VAGINAL DISCHARGE OR ITCHING  ? ?Items with * indicate a potential emergency and should be followed up as soon as possible or go to the Emergency Department if any problems should occur. ? ?Please show the CHEMOTHERAPY ALERT CARD or IMMUNOTHERAPY ALERT CARD at  check-in to the Emergency Department and triage nurse. ? ?Should you have questions after your visit or need to cancel or reschedule your appointment, please contact [facility]  Dept: [phone]  and follow the prompts.  Office hours are 8:00 a.m. to 4:30 p.m. Monday - Friday. Please note that voicemails left after 4:00 p.m. may not be returned until the following business day.  We are closed weekends and major holidays. You have access to a nurse at all times for urgent questions. Please call the main number to the clinic Dept: [phone] and follow the prompts. ? ? ?For any non-urgent questions, you may also contact your provider using MyChart. We now offer e-Visits for anyone 18 and older to request care online for non-urgent symptoms. For details visit [email or web address]. ?  ?Also download the MyChart app! Go to the app store, search "MyChart", open the app, select [facility], and log in with your MyChart username and password. ? ?Due to Covid, a mask is required upon entering the hospital/clinic. If you do not have a mask, one will be given to you upon arrival. For doctor visits, patients may have 1 support person aged 18 or older with them. For treatment visits, patients cannot have anyone with them due to current Covid guidelines and our immunocompromised population.  ? ?

## 2020-07-20 NOTE — Progress Notes (Signed)
Hematology and Oncology Follow Up Visit  Stephen Lynch [id number] Nov 04, 1945 75 y.o. 07/20/2020 11:33 AM Stephen Lynch, [name], Stephen Rude, MD   Principle Diagnosis: 15 year old man with advanced prostate cancer with disease to the bone diagnosed in February 2021.  He developed castration-resistant after presenting with PSA of 193 at the time of diagnosis.   Prior Therapy:  He status post CT-guided biopsy of the right iliac bone.  Stephen Lynch started on April 20, 2019.  Xtandi 160 mg daily started in April 2021.  Therapy discontinued in March 2022 due to progression of disease.  Zytiga 1000 mg daily with prednisone 5 mg daily started in March 2022.   Current therapy:    Eligard 30 mg started in April 2021.  Next injection will be after August 27 2020.  Taxotere chemotherapy 75 mg per metered square started on June 08, 2020.  He is here for cycle 3 of therapy.   Interim History: Stephen Lynch returns today for a repeat evaluation.  Since the last visit, he reports no major changes in his health.  He continues to tolerate chemotherapy without any major complaints.  He continues to enjoy improvement in his quality of life and decrease in his pain since the start of chemotherapy.  He is ambulating with very little to no assistance and climbing flights of stairs without any issues which was not the case previously.  He denies any nausea, vomiting or abdominal pain.  His pain is overall manageable with Tylenol and infrequently uses Percocet.  Medications: Updated on review. Current Outpatient Medications  Medication Sig Dispense Refill   acetaminophen (TYLENOL) 500 MG tablet Take 500 mg by mouth every 6 (six) hours as needed for mild pain.     ASPIRIN 81 PO Take 81 mg by mouth daily.      Calcium-Magnesium-Zinc 333-133-5 MG TABS Take 1 tablet by mouth daily.     Chromium 1 MG CAPS Take by mouth.     Continuous Blood Gluc Sensor (FREESTYLE LIBRE 2 SENSOR) MISC APPLY EVERY 14 DAYS, CHECK GLUCOSE BEFORE  EACH MEAL, 2 HRS AFTER, IN THE MORNING AND BEFORE BEDTIME     doxycycline (VIBRA-TABS) 100 MG tablet      DULoxetine (CYMBALTA) 60 MG capsule Take 60 mg by mouth daily.     ibuprofen (ADVIL) 200 MG tablet Take 200 mg by mouth every 6 (six) hours as needed for moderate pain.     insulin lispro (HUMALOG) 100 UNIT/ML KwikPen Junior Inject 0.2 mLs (20 Units total) into the skin 3 (three) times daily after meals. (Patient taking differently: Inject 7-25 Units into the skin 3 (three) times daily after meals. Per sliding scale) 3 mL 6   lidocaine (LIDODERM) 5 % Place 2 patches onto the skin daily. Remove & Discard patch within 12 hours or as directed by MD 30 patch 0   lidocaine-prilocaine (EMLA) cream Apply 1 application topically as needed. 30 g 0   loperamide (IMODIUM) 1 MG/5ML solution Take by mouth as needed for diarrhea or loose stools.     magnesium 30 MG tablet Take 30 mg by mouth 2 (two) times daily.     Menthol, Topical Analgesic, (BIOFREEZE EX) Apply topically.     Menthol-Camphor (TIGER BALM ARTHRITIS RUB EX) Apply 1 application topically 2 (two) times daily as needed (back pain).     ondansetron (ZOFRAN) 4 MG tablet TAKE 1 TABLET BY MOUTH EVERY 6 HOURS AS NEEDED FOR NAUSEA 20 tablet 0   oxyCODONE-acetaminophen (PERCOCET) 7.5-325 MG tablet Take 1 tablet  by mouth 4 (four) times daily as needed. 60 tablet 0   predniSONE (DELTASONE) 5 MG tablet Take 1 tablet (5 mg total) by mouth daily with breakfast. 90 tablet 3   prochlorperazine (COMPAZINE) 10 MG tablet Take 1 tablet (10 mg total) by mouth every 6 (six) hours as needed for nausea or vomiting. 30 tablet 0   rosuvastatin (CRESTOR) 10 MG tablet Take 1 tablet (10 mg total) by mouth daily. 90 tablet 3   TRESIBA FLEXTOUCH 100 UNIT/ML SOPN FlexTouch Pen Inject 10 Units into the skin at bedtime.      ULTRAM 50 MG tablet Take 50 mg by mouth every 6 (six) hours as needed for pain.     No current facility-administered medications for this visit.      Allergies: No Known Allergies    Physical Exam:   Blood pressure (!) 177/81, pulse 64, temperature 98.2 F (36.8 C), temperature source Oral, resp. rate 17, weight 223 lb 14.4 oz (101.6 kg), SpO2 100 %.     ECOG: 1    General appearance: Comfortable appearing without any discomfort Head: Normocephalic without any trauma Oropharynx: Mucous membranes are moist and pink without any thrush or ulcers. Eyes: Pupils are equal and round reactive to light. Lymph nodes: No cervical, supraclavicular, inguinal or axillary lymphadenopathy.   Heart:regular rate and rhythm.  S1 and S2 without leg edema. Lung: Clear without any rhonchi or wheezes.  No dullness to percussion. Abdomin: Soft, nontender, nondistended with good bowel sounds.  No hepatosplenomegaly. Musculoskeletal: No joint deformity or effusion.  Full range of motion noted. Neurological: No deficits noted on motor, sensory and deep tendon reflex exam. Skin: No petechial rash or dryness.  Appeared moist.               Lab Results: Lab Results  Component Value Date   WBC 7.4 06/29/2020   HGB 8.3 (L) 06/29/2020   HCT 26.3 (L) 06/29/2020   MCV 89.8 06/29/2020   PLT 171 06/29/2020     Chemistry      Component Value Date/Time   NA 141 07/18/2020 1022   K 4.5 07/18/2020 1022   CL 105 07/18/2020 1022   CO2 26 07/18/2020 1022   BUN 18 07/18/2020 1022   CREATININE 0.82 07/18/2020 1022   CREATININE 0.71 06/29/2020 1133      Component Value Date/Time   CALCIUM 8.7 07/18/2020 1022   ALKPHOS 804 (H) 06/29/2020 1133   AST 13 (L) 06/29/2020 1133   ALT 6 06/29/2020 1133   BILITOT 0.3 06/29/2020 1133       Results for Stephen Lynch (MRN [id number]) as of 07/20/2020 11:35  Ref. Range 05/24/2020 13:42 06/08/2020 12:28 06/29/2020 11:33  Prostate Specific Ag, Serum Latest Ref Range: 0.0 - 4.0 ng/mL 142.0 (H) 129.0 (H) 75.9 (H)      Impression and Plan:   75 year old man with:  1.   Castration-resistant  advanced prostate cancer with disease to the bone diagnosed in February 2022.    He has tolerated Taxotere chemotherapy with overall excellent clinical benefit and PSA decline.  His PSA after 2 cycles of therapy down to 75 from 142.  Risks and benefits of continuing this treatment were discussed at this time.  Potential complications and include nausea, vomiting, myelosuppression, neutropenia and possible sepsis were reiterated.  He is agreeable to proceed at this time.   2.  Androgen deprivation: He continues to be on Eligard without any complications.  He will receive the next 1 in July 2022.  Complications including hot flashes weight gain among others were reviewed.   3.  Bone directed therapy: Delton See has been deferred until he obtain dental clearance.  I recommended calcium and vitamin D supplements.   4.  Prognosis and goals of care: His disease is incurable although aggressive measures are warranted at this time.   5.  Anemia: Related to malignancy and chemotherapy.  We will defer any transfusion for the time being.  6.  Bone pain: Related to metastatic cancer to the bone.  7.  IV access: Port-A-Cath remains in use without any issues.   8.  Follow-up: He will return in 3 weeks for the next cycle of therapy and evaluation.    30  minutes were dedicated to this visit.  The time was spent on reviewing his disease status, addressing complications related to his cancer and cancer therapy.   Zola Button, MD 6/9/202211:33 AM

## 2020-07-21 ENCOUNTER — Telehealth: Payer: Self-pay

## 2020-07-21 LAB — FRUCTOSAMINE: Fructosamine: 350 umol/L — ABNORMAL HIGH (ref 205–285)

## 2020-07-21 LAB — PROSTATE-SPECIFIC AG, SERUM (LABCORP): Prostate Specific Ag, Serum: 56.8 ng/mL — ABNORMAL HIGH (ref 0.0–4.0)

## 2020-07-21 NOTE — Telephone Encounter (Signed)
I called pt and spoke with [name], his partner, and advised as indicated. She expressed understanding of this information.

## 2020-07-21 NOTE — Telephone Encounter (Signed)
-----   Message from [name], MD sent at 07/21/2020 10:24 AM EDT ----- Please let him know his PSA is down in response to chemotherapy

## 2020-07-22 ENCOUNTER — Other Ambulatory Visit: Payer: Self-pay

## 2020-07-22 ENCOUNTER — Inpatient Hospital Stay: Payer: Medicare Other

## 2020-07-22 VITALS — BP 151/64 | HR 72 | Temp 96.2°F | Resp 19

## 2020-07-22 DIAGNOSIS — D6481 Anemia due to antineoplastic chemotherapy: Secondary | ICD-10-CM | POA: Diagnosis not present

## 2020-07-22 DIAGNOSIS — Z5111 Encounter for antineoplastic chemotherapy: Secondary | ICD-10-CM | POA: Diagnosis not present

## 2020-07-22 DIAGNOSIS — C61 Malignant neoplasm of prostate: Secondary | ICD-10-CM | POA: Diagnosis not present

## 2020-07-22 DIAGNOSIS — Z192 Hormone resistant malignancy status: Secondary | ICD-10-CM | POA: Diagnosis not present

## 2020-07-22 DIAGNOSIS — D630 Anemia in neoplastic disease: Secondary | ICD-10-CM | POA: Diagnosis not present

## 2020-07-22 DIAGNOSIS — C7951 Secondary malignant neoplasm of bone: Secondary | ICD-10-CM | POA: Diagnosis not present

## 2020-07-22 MED ORDER — PEGFILGRASTIM-JMDB 6 MG/0.6ML ~~LOC~~ SOSY
6.0000 mg | PREFILLED_SYRINGE | Freq: Once | SUBCUTANEOUS | Status: AC
  Administered 2020-07-22: 6 mg via SUBCUTANEOUS

## 2020-07-22 NOTE — Patient Instructions (Signed)

## 2020-07-23 NOTE — Progress Notes (Signed)
Patient ID: Stephen Lynch, male   DOB: [birth year], 75 y.o.   MRN: [id number]           Reason for Appointment: for Type 2 Diabetes  Referring PCP: Criss Rosales   History of Present Illness:          Date of diagnosis of type 2 diabetes mellitus: 2007?       Background history:   He has been on insulin since about the time of diagnosis.  He thinks initially oral agents that were prescribed did not work No detailed history of his previous treatment is available and A1c from last year was over 12%  Recent history:   Most recent A1c is 10% done on 06/26/2020 Fructosamine is now 350  INSULIN regimen is: [place] 26U at bedtime, Humalog 20--15/20 acl, acs       Non-insulin hypoglycemic drugs the patient is taking are: none  Current management, blood sugar patterns and problems identified:  He has been using the freestyle libre sensor but the date and time on his reader are inaccurate and his time is reading 12 hours behind and update 20th May He was told specifically to take HUMALOG before each meal and the doses He is however appearing to be taking his Humalog only based on his blood sugar reading and only when it goes up including at bedtime Because of this he has HYPERGLYCEMIA most of the day and occasionally drops significantly at bedtime from Humalog Checking his blood sugars more often than before as directed He thinks he is increasing his [place] as on the titration sheet given but he has only 1 normal blood sugar fasting on his home record and he has been on about the same dose for a few days Only minimal hypoglycemia His injection sites were changed   Typical meal intake: Breakfast is usually cereal and almond milk or grits and eggs.  Lunch fruits, tuna or other seafood dinner is rice, vegetables and protein.                Snacks are nuts, fruits, ice cream, chocolate  Exercise: minimal, limited by weakness  Glucose monitoring:  done 1-2 times a day         Glucometer: Libre      His blood sugars are the highest in the afternoons and evenings with significant variability and well over 200 on an average except between 4 AM and 8 AM Lowest blood sugars are around 6 Verio only occasional low sugars around midnight.  Also hypoglycemia usually transient The blood sugars after meals are significantly high but fluctuate considerably Usually blood sugars appear to be rising after breakfast and continue to stay high the rest of the day today    Data for the last 2 weeks from the [place] version 2   CGM use % of time 82  2-week average/GV 228  Time in range      30%  % Time Above 180/250 32+37  % Time above 250   % Time Below 70 1     PRE-MEAL Fasting Lunch Dinner Bedtime Overall  Glucose range:       Averages: 175 226 236  228   POST-MEAL PC Breakfast PC Lunch PC Dinner  Glucose range:     Averages: 233 57 253     Dietician visit, most recent: never  Weight history:  Wt Readings from Last 3 Encounters:  07/24/20 224 lb 12.8 oz (102 kg)  07/20/20 223 lb 14.4 oz (101.6 kg)  06/29/20 216 lb 1.6 oz (98 kg)    Glycemic control:   Lab Results  Component Value Date   HGBA1C 10.0 (A) 06/26/2020   Lab Results  Component Value Date   LDLCALC 72 07/18/2020   CREATININE 0.78 07/20/2020   No results found for: [facility]  Lab Results  Component Value Date   FRUCTOSAMINE 350 (H) 07/18/2020    Appointment on 07/20/2020  Component Date Value Ref Range Status   Prostate Specific Ag, Serum 07/20/2020 56.8 (A) 0.0 - 4.0 ng/mL Final   Comment: (NOTE) Roche ECLIA methodology. According to the American Urological Association, Serum PSA should decrease and remain at undetectable levels after radical prostatectomy. The AUA defines biochemical recurrence as an initial PSA value 0.2 ng/mL or greater followed by a subsequent confirmatory PSA value 0.2 ng/mL or greater. Values obtained with different assay methods or kits cannot be used interchangeably.  Results cannot be interpreted as absolute evidence of the presence or absence of malignant disease. Performed At: [facility] [place], [place] [id number] Rush Farmer MD IP:[id number]    Sodium 07/20/2020 140  135 - 145 mmol/L Final   Potassium 07/20/2020 4.6  3.5 - 5.1 mmol/L Final   Chloride 07/20/2020 106  98 - 111 mmol/L Final   CO2 07/20/2020 27  22 - 32 mmol/L Final   Glucose, Bld 07/20/2020 199 (A) 70 - 99 mg/dL Final   Glucose reference range applies only to samples taken after fasting for at least 8 hours.   BUN 07/20/2020 18  8 - 23 mg/dL Final   Creatinine 07/20/2020 0.78  0.61 - 1.24 mg/dL Final   Calcium 07/20/2020 9.0  8.9 - 10.3 mg/dL Final   Total Protein 07/20/2020 7.0  6.5 - 8.1 g/dL Final   Albumin 07/20/2020 3.6  3.5 - 5.0 g/dL Final   AST 07/20/2020 12 (A) 15 - 41 U/L Final   ALT 07/20/2020 9  0 - 44 U/L Final   Alkaline Phosphatase 07/20/2020 428 (A) 38 - 126 U/L Final   Total Bilirubin 07/20/2020 0.3  0.3 - 1.2 mg/dL Final   GFR, Estimated 07/20/2020 >60  >60 mL/min Final   Comment: (NOTE) Calculated using the CKD-EPI Creatinine Equation (2021)    Anion gap 07/20/2020 7  5 - 15 Final   Performed at [facility], [place] [address] [place], [place] [address]   WBC Count 07/20/2020 5.5  4.0 - 10.5 K/uL Final   RBC 07/20/2020 2.73 (A) 4.22 - 5.81 MIL/uL Final   Hemoglobin 07/20/2020 8.1 (A) 13.0 - 17.0 g/dL Final   HCT 07/20/2020 25.4 (A) 39.0 - 52.0 % Final   MCV 07/20/2020 93.0  80.0 - 100.0 fL Final   MCH 07/20/2020 29.7  26.0 - 34.0 pg Final   MCHC 07/20/2020 31.9  30.0 - 36.0 g/dL Final   RDW 07/20/2020 23.9 (A) 11.5 - 15.5 % Final   Platelet Count 07/20/2020 145 (A) 150 - 400 K/uL Final   nRBC 07/20/2020 1.7 (A) 0.0 - 0.2 % Final   Neutrophils Relative % 07/20/2020 57  % Final   Neutro Abs 07/20/2020 3.1  1.7 - 7.7 K/uL Final   Lymphocytes Relative 07/20/2020 29  % Final   Lymphs Abs 07/20/2020 1.6  0.7  - 4.0 K/uL Final   Monocytes Relative 07/20/2020 8  % Final   Monocytes Absolute 07/20/2020 0.5  0.1 - 1.0 K/uL Final   Eosinophils Relative 07/20/2020 1  % Final   Eosinophils Absolute 07/20/2020  0.0  0.0 - 0.5 K/uL Final   Basophils Relative 07/20/2020 1  % Final   Basophils Absolute 07/20/2020 0.0  0.0 - 0.1 K/uL Final   Immature Granulocytes 07/20/2020 4  % Final   Abs Immature Granulocytes 07/20/2020 0.20 (A) 0.00 - 0.07 K/uL Final   Performed at [facility], [place] [address], [place], [place] [address]  Lab on 07/18/2020  Component Date Value Ref Range Status   Cholesterol 07/18/2020 142  0 - 200 mg/dL Final   ATP III Classification       Desirable:  < 200 mg/dL               Borderline High:  200 - 239 mg/dL          High:  > = 240 mg/dL   Triglycerides 07/18/2020 80.0  0.0 - 149.0 mg/dL Final   Normal:  <150 mg/dLBorderline High:  150 - 199 mg/dL   HDL 07/18/2020 53.80  >39.00 mg/dL Final   VLDL 07/18/2020 16.0  0.0 - 40.0 mg/dL Final   LDL Cholesterol 07/18/2020 72  0 - 99 mg/dL Final   Total CHOL/HDL Ratio 07/18/2020 3   Final                  Men          Women1/2 Average Risk     3.4          3.3Average Risk          5.0          4.42X Average Risk          9.6          7.13X Average Risk          15.0          11.0                       NonHDL 07/18/2020 88.46   Final   NOTE:  Non-HDL goal should be 30 mg/dL higher than patient's LDL goal (i.e. LDL goal of < 70 mg/dL, would have non-HDL goal of < 100 mg/dL)   Free T4 07/18/2020 1.07  0.60 - 1.60 ng/dL Final   Comment: Specimens from patients who are undergoing biotin therapy and /or ingesting biotin supplements may contain high levels of biotin.  The higher biotin concentration in these specimens interferes with this Free T4 assay.  Specimens that contain high levels  of biotin may cause false high results for this Free T4 assay.  Please interpret results in light of the total clinical presentation of the  patient.     TSH 07/18/2020 0.76  0.35 - 4.50 uIU/mL Final   Fructosamine 07/18/2020 350 (A) 205 - 285 umol/L Final   Sodium 07/18/2020 141  135 - 145 mEq/L Final   Potassium 07/18/2020 4.5  3.5 - 5.1 mEq/L Final   Chloride 07/18/2020 105  96 - 112 mEq/L Final   CO2 07/18/2020 26  19 - 32 mEq/L Final   Glucose, Bld 07/18/2020 128 (A) 70 - 99 mg/dL Final   BUN 07/18/2020 18  6 - 23 mg/dL Final   Creatinine, Ser 07/18/2020 0.82  0.40 - 1.50 mg/dL Final   GFR 07/18/2020 87.17  >60.00 mL/min Final   Calculated using the CKD-EPI Creatinine Equation (2021)   Calcium 07/18/2020 8.7  8.4 - 10.5 mg/dL Final    Allergies as of 07/24/2020   No Known Allergies  Medication List        Accurate as of July 24, 2020  1:29 PM. If you have any questions, ask your nurse or doctor.          acetaminophen 500 MG tablet Commonly known as: TYLENOL Take 500 mg by mouth every 6 (six) hours as needed for mild pain.   ASPIRIN 81 PO Take 81 mg by mouth daily.   BIOFREEZE EX Apply topically.   Calcium-Magnesium-Zinc 333-133-5 MG Tabs Take 1 tablet by mouth daily.   Chromium 1 MG Caps Take by mouth.   doxycycline 100 MG tablet Commonly known as: VIBRA-TABS   DULoxetine 60 MG capsule Commonly known as: CYMBALTA Take 60 mg by mouth daily.   FreeStyle Libre 2 Sensor Misc APPLY EVERY 14 DAYS, CHECK GLUCOSE BEFORE EACH MEAL, 2 HRS AFTER, IN THE MORNING AND BEFORE BEDTIME   ibuprofen 200 MG tablet Commonly known as: ADVIL Take 200 mg by mouth every 6 (six) hours as needed for moderate pain.   insulin lispro 100 UNIT/ML KwikPen Junior Generic drug: insulin lispro Inject 0.2 mLs (20 Units total) into the skin 3 (three) times daily after meals. What changed:  how much to take additional instructions   lidocaine 5 % Commonly known as: LIDODERM Place 2 patches onto the skin daily. Remove & Discard patch within 12 hours or as directed by MD   lidocaine-prilocaine cream Commonly  known as: EMLA Apply 1 application topically as needed.   loperamide 1 MG/5ML solution Commonly known as: IMODIUM Take by mouth as needed for diarrhea or loose stools.   magnesium 30 MG tablet Take 30 mg by mouth 2 (two) times daily.   ondansetron 4 MG tablet Commonly known as: ZOFRAN TAKE 1 TABLET BY MOUTH EVERY 6 HOURS AS NEEDED FOR NAUSEA   oxyCODONE-acetaminophen 7.5-325 MG tablet Commonly known as: PERCOCET Take 1 tablet by mouth 4 (four) times daily as needed.   prochlorperazine 10 MG tablet Commonly known as: COMPAZINE Take 1 tablet (10 mg total) by mouth every 6 (six) hours as needed for nausea or vomiting.   rosuvastatin 10 MG tablet Commonly known as: CRESTOR Take 1 tablet (10 mg total) by mouth daily.   TIGER BALM ARTHRITIS RUB EX Apply 1 application topically 2 (two) times daily as needed (back pain).   [name] FlexTouch 100 UNIT/ML FlexTouch Pen Generic drug: insulin degludec Inject 10 Units into the skin at bedtime.   Ultram 50 MG tablet Generic drug: traMADol Take 50 mg by mouth every 6 (six) hours as needed for pain.        Allergies: No Known Allergies  Past Medical History:  Diagnosis Date   Cancer ([place])    prostate   Diabetes mellitus without complication ([place])    Dyslipidemia    Hypertension     Past Surgical History:  Procedure Laterality Date   EYE SURGERY     IR IMAGING GUIDED PORT INSERTION  06/12/2020    Family History  Problem Relation Age of Onset   CAD Mother 18   Other Father 19       age   Other Brother 59       unknown cause    Social History:  reports that he has quit smoking. His smoking use included cigarettes. He has a 7.50 pack-year smoking history. He has never used smokeless tobacco. He reports previous alcohol use. He reports previous drug use.   Review of Systems   Lipid history: Has been treated with rosuvastatin by  PCP    Lab Results  Component Value Date   CHOL 142 07/18/2020   HDL 53.80 07/18/2020    LDLCALC 72 07/18/2020   TRIG 80.0 07/18/2020   CHOLHDL 3 07/18/2020           Hypertension: Has been present previously on enalapril, managed by PCP  BP Readings from Last 3 Encounters:  07/24/20 (!) 150/68  07/22/20 (!) 151/64  07/20/20 (!) 177/81    Most recent eye exam was in 2022  Most recent foot exam: 5/22  Currently known complications of diabetes: Erectile dysfunction  LABS:  Appointment on 07/20/2020  Component Date Value Ref Range Status   Prostate Specific Ag, Serum 07/20/2020 56.8 (A) 0.0 - 4.0 ng/mL Final   Comment: (NOTE) Roche ECLIA methodology. According to the American Urological Association, Serum PSA should decrease and remain at undetectable levels after radical prostatectomy. The AUA defines biochemical recurrence as an initial PSA value 0.2 ng/mL or greater followed by a subsequent confirmatory PSA value 0.2 ng/mL or greater. Values obtained with different assay methods or kits cannot be used interchangeably. Results cannot be interpreted as absolute evidence of the presence or absence of malignant disease. Performed At: [facility] [place], [place] [id number] Rush Farmer MD TK:[id number]    Sodium 07/20/2020 140  135 - 145 mmol/L Final   Potassium 07/20/2020 4.6  3.5 - 5.1 mmol/L Final   Chloride 07/20/2020 106  98 - 111 mmol/L Final   CO2 07/20/2020 27  22 - 32 mmol/L Final   Glucose, Bld 07/20/2020 199 (A) 70 - 99 mg/dL Final   Glucose reference range applies only to samples taken after fasting for at least 8 hours.   BUN 07/20/2020 18  8 - 23 mg/dL Final   Creatinine 07/20/2020 0.78  0.61 - 1.24 mg/dL Final   Calcium 07/20/2020 9.0  8.9 - 10.3 mg/dL Final   Total Protein 07/20/2020 7.0  6.5 - 8.1 g/dL Final   Albumin 07/20/2020 3.6  3.5 - 5.0 g/dL Final   AST 07/20/2020 12 (A) 15 - 41 U/L Final   ALT 07/20/2020 9  0 - 44 U/L Final   Alkaline Phosphatase 07/20/2020 428 (A) 38 - 126 U/L Final   Total Bilirubin  07/20/2020 0.3  0.3 - 1.2 mg/dL Final   GFR, Estimated 07/20/2020 >60  >60 mL/min Final   Comment: (NOTE) Calculated using the CKD-EPI Creatinine Equation (2021)    Anion gap 07/20/2020 7  5 - 15 Final   Performed at [facility], [place] [address], [place], [place] [address]   WBC Count 07/20/2020 5.5  4.0 - 10.5 K/uL Final   RBC 07/20/2020 2.73 (A) 4.22 - 5.81 MIL/uL Final   Hemoglobin 07/20/2020 8.1 (A) 13.0 - 17.0 g/dL Final   HCT 07/20/2020 25.4 (A) 39.0 - 52.0 % Final   MCV 07/20/2020 93.0  80.0 - 100.0 fL Final   MCH 07/20/2020 29.7  26.0 - 34.0 pg Final   MCHC 07/20/2020 31.9  30.0 - 36.0 g/dL Final   RDW 07/20/2020 23.9 (A) 11.5 - 15.5 % Final   Platelet Count 07/20/2020 145 (A) 150 - 400 K/uL Final   nRBC 07/20/2020 1.7 (A) 0.0 - 0.2 % Final   Neutrophils Relative % 07/20/2020 57  % Final   Neutro Abs 07/20/2020 3.1  1.7 - 7.7 K/uL Final   Lymphocytes Relative 07/20/2020 29  % Final   Lymphs Abs 07/20/2020 1.6  0.7 - 4.0 K/uL Final  Monocytes Relative 07/20/2020 8  % Final   Monocytes Absolute 07/20/2020 0.5  0.1 - 1.0 K/uL Final   Eosinophils Relative 07/20/2020 1  % Final   Eosinophils Absolute 07/20/2020 0.0  0.0 - 0.5 K/uL Final   Basophils Relative 07/20/2020 1  % Final   Basophils Absolute 07/20/2020 0.0  0.0 - 0.1 K/uL Final   Immature Granulocytes 07/20/2020 4  % Final   Abs Immature Granulocytes 07/20/2020 0.20 (A) 0.00 - 0.07 K/uL Final   Performed at [facility], [place] [address] [place], [place] [address]  Lab on 07/18/2020  Component Date Value Ref Range Status   Cholesterol 07/18/2020 142  0 - 200 mg/dL Final   ATP III Classification       Desirable:  < 200 mg/dL               Borderline High:  200 - 239 mg/dL          High:  > = 240 mg/dL   Triglycerides 07/18/2020 80.0  0.0 - 149.0 mg/dL Final   Normal:  <150 mg/dLBorderline High:  150 - 199 mg/dL   HDL 07/18/2020 53.80  >39.00 mg/dL Final   VLDL 07/18/2020  16.0  0.0 - 40.0 mg/dL Final   LDL Cholesterol 07/18/2020 72  0 - 99 mg/dL Final   Total CHOL/HDL Ratio 07/18/2020 3   Final                  Men          Women1/2 Average Risk     3.4          3.3Average Risk          5.0          4.42X Average Risk          9.6          7.13X Average Risk          15.0          11.0                       NonHDL 07/18/2020 88.46   Final   NOTE:  Non-HDL goal should be 30 mg/dL higher than patient's LDL goal (i.e. LDL goal of < 70 mg/dL, would have non-HDL goal of < 100 mg/dL)   Free T4 07/18/2020 1.07  0.60 - 1.60 ng/dL Final   Comment: Specimens from patients who are undergoing biotin therapy and /or ingesting biotin supplements may contain high levels of biotin.  The higher biotin concentration in these specimens interferes with this Free T4 assay.  Specimens that contain high levels  of biotin may cause false high results for this Free T4 assay.  Please interpret results in light of the total clinical presentation of the patient.     TSH 07/18/2020 0.76  0.35 - 4.50 uIU/mL Final   Fructosamine 07/18/2020 350 (A) 205 - 285 umol/L Final   Sodium 07/18/2020 141  135 - 145 mEq/L Final   Potassium 07/18/2020 4.5  3.5 - 5.1 mEq/L Final   Chloride 07/18/2020 105  96 - 112 mEq/L Final   CO2 07/18/2020 26  19 - 32 mEq/L Final   Glucose, Bld 07/18/2020 128 (A) 70 - 99 mg/dL Final   BUN 07/18/2020 18  6 - 23 mg/dL Final   Creatinine, Ser 07/18/2020 0.82  0.40 - 1.50 mg/dL Final   GFR 07/18/2020 87.17  >60.00 mL/min Final  Calculated using the CKD-EPI Creatinine Equation (2021)   Calcium 07/18/2020 8.7  8.4 - 10.5 mg/dL Final    Physical Examination:  BP (!) 150/68 (BP Location: Right Arm, Patient Position: Sitting, Cuff Size: Normal)   Pulse 78   Ht 6\' 1"  (1.854 m)   Wt 224 lb 12.8 oz (102 kg)   SpO2 97%   BMI 29.66 kg/m         ASSESSMENT:  Diabetes type 2  See history of present illness for detailed discussion of current diabetes management,  blood sugar patterns and problems identified  Most recent A1c is 10% and fructosamine is relatively high at 350  Current treatment regimen is only basal bolus insulin However having difficulty understanding the need to take mealtime coverage before eating and still continues to take the NovoLog only when blood sugars are above normal  Hypertension: Followed by PCP but does not appear to be controlled  Thyroid enlargement: He has not gone for his ultrasound as ordered he will  PLAN:    Glucose monitoring: Continue to use the freestyle libre and make sure he checks his blood sugars before and after meals at least 4 times a day  Diabetes education: Patient will need nutritional counseling and will have him be scheduled for this as ordered  Lifestyle changes: Dietary changes: Recommended cutting back on any high fat snacks and given a list of low fat snacks to have   Therapy changes: TRESIBA will be 30 units for now  HUMALOG: He will take between 15-25 units based on the size of the meal, repeat in the instructions given Is the need to take the Humalog right before eating May take additional 5 units if blood sugars are over 200 2 hours after meals Discussed postprandial blood sugar targets  She will benefit from adding Farxiga 5 mg daily Discussed action of SGLT 2 drugs on lowering glucose by decreasing kidney absorption of glucose, benefits of weight loss and lower blood pressure, possible side effects, cardiovascular and renal benefits and dosage regimen  This may also help his blood pressure He will increase his fluid intake in general  Preventive care needed: Urine microalbumin, follow-up lipids  GOITER on the left side: Thyroid ultrasound will be ordered again   Follow-up: 4 weeks    There are no Patient Instructions on file for this visit.      [name] 07/24/2020, 1:29 PM   Note: This office note was prepared with Dragon voice recognition system technology. Any  transcriptional errors that result from this process are unintentional.

## 2020-07-24 ENCOUNTER — Encounter: Payer: Medicare Other | Attending: Pain Medicine | Admitting: Dietician

## 2020-07-24 ENCOUNTER — Telehealth: Payer: Self-pay | Admitting: Endocrinology

## 2020-07-24 ENCOUNTER — Other Ambulatory Visit: Payer: Self-pay

## 2020-07-24 ENCOUNTER — Encounter: Payer: Self-pay | Admitting: Endocrinology

## 2020-07-24 ENCOUNTER — Ambulatory Visit (INDEPENDENT_AMBULATORY_CARE_PROVIDER_SITE_OTHER): Payer: Medicare Other | Admitting: Endocrinology

## 2020-07-24 ENCOUNTER — Encounter: Payer: Self-pay | Admitting: Dietician

## 2020-07-24 VITALS — BP 150/68 | HR 78 | Ht 73.0 in | Wt 224.8 lb

## 2020-07-24 DIAGNOSIS — E119 Type 2 diabetes mellitus without complications: Secondary | ICD-10-CM | POA: Diagnosis not present

## 2020-07-24 DIAGNOSIS — E041 Nontoxic single thyroid nodule: Secondary | ICD-10-CM | POA: Diagnosis not present

## 2020-07-24 DIAGNOSIS — E1165 Type 2 diabetes mellitus with hyperglycemia: Secondary | ICD-10-CM

## 2020-07-24 DIAGNOSIS — Z794 Long term (current) use of insulin: Secondary | ICD-10-CM | POA: Diagnosis not present

## 2020-07-24 DIAGNOSIS — I10 Essential (primary) hypertension: Secondary | ICD-10-CM | POA: Diagnosis not present

## 2020-07-24 MED ORDER — DAPAGLIFLOZIN PROPANEDIOL 5 MG PO TABS: 5.0000 mg | ORAL_TABLET | Freq: Every day | ORAL | 3 refills | Status: DC

## 2020-07-24 NOTE — Patient Instructions (Signed)
Take the Humalog before each meal based on how much you eating:  For small meals/lunch take 15 units, before eating  Breakfast 20 units For supper/larger meals take 25 units  If the blood sugar is over 200 before eating you can add another 5 units  Tresiba 30 units daily  Farxiga in am

## 2020-07-24 NOTE — Progress Notes (Signed)
Diabetes Self-Management Education  Visit Type: First/Initial  Appt. Start Time: 1410 Appt. End Time: 3818 Patient is here today alone.  This is a walk in appointment from Dr. Dwyane Dee.  07/24/2020  [name], identified by name and date of birth, is a 75 y.o. male with a diagnosis of Diabetes: Type 2.   ASSESSMENT  He would like to learn more about nutrition to improve his blood sugar. Sensor reader was 63 now (2:30 pm and last ate breakfast at 8 am).  He does have symptoms of low blood sugar.  72 on BG meter.  4 oz apple juice, crackers and PB provided.  He did not eat lunch today.  Sensor reading at the end of the appointment was 83.  He was provided a snack to take with him.  He no longer feels symptoms of hypoglycemia.  He should eat after leaving. He has been taking Humalog after his meals.  He denies missing insulin doses but does not take the Humalog if he does not eat.  History includes Type 2 Diabetes (2007), HTN, prostate cancer (currently under treatment), anemia  Labs noted to include A1C 10% 06/26/2020, GFR >60, Hgb 8.1, Alk Phos 428 He uses a [employer]. Medications include Humalog (sliding scale) and is to begin 20 units prior to breakfast, 15 units before lunch, 25 units before dinner and add another 5 units if blood glucose is greater than 200., Tresiba 30 units q HS, Farxiga.  Patient lives with his fiance.  She does most of the shopping and cooking.  He enjoys eating [place] food out. Yard work and garden (grapes, tomatoes) most days and has a treadmill occasionally. Just moved here from [place].  He is retired from the [employer].  Height 6' 1"  (1.854 m), weight 224 lb (101.6 kg). Body mass index is 29.55 kg/m.   Diabetes Self-Management Education - 07/24/20 1421       Visit Information   Visit Type First/Initial      Initial Visit   Diabetes Type Type 2    Are you currently following a meal plan? No    Are you taking your medications as prescribed?  Yes    Date Diagnosed 2007      Health Coping   How would you rate your overall health? Good      Psychosocial Assessment   Patient Belief/Attitude about Diabetes Motivated to manage diabetes    Self-care barriers None    Self-management support Doctor's office    Other persons present Patient    Patient Concerns Nutrition/Meal planning    Special Needs None    Preferred Learning Style No preference indicated    Learning Readiness Ready    How often do you need to have someone help you when you read instructions, pamphlets, or other written materials from your doctor or pharmacy? 1 - Never    What is the last grade level you completed in school? 12+      Pre-Education Assessment   Patient understands the diabetes disease and treatment process. Needs Instruction    Patient understands incorporating nutritional management into lifestyle. Needs Instruction    Patient undertands incorporating physical activity into lifestyle. Needs Instruction    Patient understands using medications safely. Needs Instruction    Patient understands monitoring blood glucose, interpreting and using results Needs Instruction    Patient understands prevention, detection, and treatment of acute complications. Needs Instruction    Patient understands prevention, detection, and treatment of chronic complications. Needs  Instruction    Patient understands how to develop strategies to address psychosocial issues. Needs Instruction    Patient understands how to develop strategies to promote health/change behavior. Needs Instruction      Complications   Last HgB A1C per patient/outside source 10 %    How often do you check your blood sugar? > 4 times/day   FreeStyle Libre   Fasting Blood glucose range (mg/dL) 70-129   usually over 200   Postprandial Blood glucose range (mg/dL) >200   300   Number of hypoglycemic episodes per month 4    Can you tell when your blood sugar is low? Yes    What do you do if your blood  sugar is low? takes glucose tabs (2)    Number of hyperglycemic episodes per week 14    Can you tell when your blood sugar is high? No    Have you had a dilated eye exam in the past 12 months? No   "need to make an appointment"   Have you had a dental exam in the past 12 months? No    Are you checking your feet? Yes    How many days per week are you checking your feet? 4      Dietary Intake   Breakfast sausage, grits    Snack (morning) none    Lunch skips at times OR eats out "[place] food" - buffet (shrimp egg fu young and chcien and broccoli, 1/2 cup rice, steamed cucumbers    Snack (afternoon) chocolate fudge brownies, large    Dinner chicken (broiled or air fryer) or fish, non starchy vegetables, mashed potatoes, peas OR Spaghetti with meat sauce, garlic bread, vegetables    Snack (evening) cake, pie, or cookies    Beverage(s) unsweeted tea with sugar sub, juice, water      Exercise   Exercise Type Light (walking / raking leaves)   yard work   How many days per week to you exercise? 6    How many minutes per day do you exercise? 60    Total minutes per week of exercise 360      Patient Education   Previous Diabetes Education No    Disease state  Definition of diabetes, type 1 and 2, and the diagnosis of diabetes    Nutrition management  Role of diet in the treatment of diabetes and the relationship between the three main macronutrients and blood glucose level;Food label reading, portion sizes and measuring food.;Meal options for control of blood glucose level and chronic complications.;Information on hints to eating out and maintain blood glucose control.    Physical activity and exercise  Role of exercise on diabetes management, blood pressure control and cardiac health.    Medications Reviewed patients medication for diabetes, action, purpose, timing of dose and side effects.    Monitoring Identified appropriate SMBG and/or A1C goals.;Taught/discussed recording of test results and  interpretation of SMBG.;Daily foot exams;Yearly dilated eye exam    Acute complications Taught treatment of hypoglycemia - the 15 rule.;Discussed and identified patients' treatment of hyperglycemia.    Chronic complications Relationship between chronic complications and blood glucose control    Psychosocial adjustment Role of stress on diabetes;Identified and addressed patients feelings and concerns about diabetes    Personal strategies to promote health Lifestyle issues that need to be addressed for better diabetes care      Individualized Goals (developed by patient)   Nutrition General guidelines for healthy choices and portions discussed  Physical Activity Exercise 5-7 days per week;30 minutes per day    Medications take my medication as prescribed    Monitoring  test my blood glucose as discussed    Reducing Risk increase portions of healthy fats;examine blood glucose patterns    Health Coping discuss diabetes with (comment)   MD, RD, CDCES     Post-Education Assessment   Patient understands the diabetes disease and treatment process. Demonstrates understanding / competency    Patient understands incorporating nutritional management into lifestyle. Needs Review    Patient undertands incorporating physical activity into lifestyle. Demonstrates understanding / competency    Patient understands using medications safely. Demonstrates understanding / competency    Patient understands monitoring blood glucose, interpreting and using results Demonstrates understanding / competency    Patient understands prevention, detection, and treatment of acute complications. Demonstrates understanding / competency    Patient understands prevention, detection, and treatment of chronic complications. Demonstrates understanding / competency    Patient understands how to develop strategies to address psychosocial issues. Demonstrates understanding / competency    Patient understands how to develop strategies  to promote health/change behavior. Needs Review      Outcomes   Expected Outcomes Demonstrated interest in learning. Expect positive outcomes    Future DMSE 4-6 wks    Program Status Not Completed             Individualized Plan for Diabetes Self-Management Training:   Learning Objective:  Patient will have a greater understanding of diabetes self-management. Patient education plan is to attend individual and/or group sessions per assessed needs and concerns.   Plan:   Patient Instructions  Make an eye appointment Continue to be active daily.  Aim for more than 30 minutes of exercise daily.  If it is too hot to work outside, then use your Treadmill or other exercise indoors for 20 minutes after you eat 2-3 times daily.  Take your insulin as prescribed.  Remember to take the Humalog before your meals.    Rethink what you drink:  Choose water or unsweetened tea with stevia rather than juice Balance your meals  1/2 your plate should be non starchy vegetables.  Lean protein or bean  1/4 plate carbohydrate  Continue to bake rather than fry Choose whole grains most often (whole wheat bread, brown rice, whole wheat english muffin instead of biscuit). Limit sweets (limit or avoid jelly, regular syrup, cake, pie, cookies, brownies, candy)  When eating out, be mindful of choices and choose smaller portions of rice.  Consider eating at home more often than eating out.  Expected Outcomes:  Demonstrated interest in learning. Expect positive outcomes  Education material provided: ADA - How to Thrive: A Guide for Your Journey with Diabetes, Food label handouts, Meal plan card, and Snack sheet, dining out tips  If problems or questions, patient to contact team via:  Phone  Future DSME appointment: 4-6 wks

## 2020-07-24 NOTE — Patient Instructions (Addendum)
Make an eye appointment Continue to be active daily.  Aim for more than 30 minutes of exercise daily.  If it is too hot to work outside, then use your Treadmill or other exercise indoors for 20 minutes after you eat 2-3 times daily.  Take your insulin as prescribed.  Remember to take the Humalog before your meals.    Rethink what you drink:  Choose water or unsweetened tea with stevia rather than juice Balance your meals  1/2 your plate should be non starchy vegetables.  Lean protein or bean  1/4 plate carbohydrate  Continue to bake rather than fry Choose whole grains most often (whole wheat bread, brown rice, whole wheat english muffin instead of biscuit). Limit sweets (limit or avoid jelly, regular syrup, cake, pie, cookies, brownies, candy)  Sugar free jello, sugar free popsickle, sugar free fudge sickles could be alternatives.  When eating out, be mindful of choices and choose smaller portions of rice.  Consider eating at home more often than eating out.

## 2020-07-25 NOTE — Telephone Encounter (Signed)
error 

## 2020-08-03 ENCOUNTER — Other Ambulatory Visit: Payer: Self-pay | Admitting: *Deleted

## 2020-08-03 NOTE — Patient Outreach (Signed)
[facility]) Care Management  [place]  08/03/2020   Yiannis Tulloch 1945-06-06 [id number]   Outgoing call placed to member, no answer, HIPAA compliant voice message left.  Call placed to member's fiance, successful.  State member has been doing well with managing chemo treatments but having trouble with DM management.  Was seen by endocrinologist and nutritionist in the last week.  Will continue to provide education and management.  Denies any urgent concerns, encouraged to contact this care manager with questions.    Encounter Medications:  Outpatient Encounter Medications as of 08/03/2020  Medication Sig   acetaminophen (TYLENOL) 500 MG tablet Take 500 mg by mouth every 6 (six) hours as needed for mild pain.   ASPIRIN 81 PO Take 81 mg by mouth daily.    Calcium-Magnesium-Zinc 333-133-5 MG TABS Take 1 tablet by mouth daily.   Chromium 1 MG CAPS Take by mouth.   Continuous Blood Gluc Sensor (FREESTYLE LIBRE 2 SENSOR) MISC APPLY EVERY 14 DAYS, CHECK GLUCOSE BEFORE EACH MEAL, 2 HRS AFTER, IN THE MORNING AND BEFORE BEDTIME   dapagliflozin propanediol (FARXIGA) 5 MG TABS tablet Take 1 tablet (5 mg total) by mouth daily.   doxycycline (VIBRA-TABS) 100 MG tablet    DULoxetine (CYMBALTA) 60 MG capsule Take 60 mg by mouth daily.   ibuprofen (ADVIL) 200 MG tablet Take 200 mg by mouth every 6 (six) hours as needed for moderate pain.   insulin lispro (HUMALOG) 100 UNIT/ML KwikPen Junior Inject 0.2 mLs (20 Units total) into the skin 3 (three) times daily after meals. (Patient taking differently: Inject 7-25 Units into the skin 3 (three) times daily after meals. Per sliding scale)   lidocaine (LIDODERM) 5 % Place 2 patches onto the skin daily. Remove & Discard patch within 12 hours or as directed by MD   lidocaine-prilocaine (EMLA) cream Apply 1 application topically as needed.   loperamide (IMODIUM) 1 MG/5ML solution Take by mouth as needed for diarrhea or loose stools.    magnesium 30 MG tablet Take 30 mg by mouth 2 (two) times daily.   Menthol, Topical Analgesic, (BIOFREEZE EX) Apply topically.   Menthol-Camphor (TIGER BALM ARTHRITIS RUB EX) Apply 1 application topically 2 (two) times daily as needed (back pain).   ondansetron (ZOFRAN) 4 MG tablet TAKE 1 TABLET BY MOUTH EVERY 6 HOURS AS NEEDED FOR NAUSEA   oxyCODONE-acetaminophen (PERCOCET) 7.5-325 MG tablet Take 1 tablet by mouth 4 (four) times daily as needed.   prochlorperazine (COMPAZINE) 10 MG tablet Take 1 tablet (10 mg total) by mouth every 6 (six) hours as needed for nausea or vomiting.   rosuvastatin (CRESTOR) 10 MG tablet Take 1 tablet (10 mg total) by mouth daily.   TRESIBA FLEXTOUCH 100 UNIT/ML SOPN FlexTouch Pen Inject 10 Units into the skin at bedtime.    ULTRAM 50 MG tablet Take 50 mg by mouth every 6 (six) hours as needed for pain.   No facility-administered encounter medications on file as of 08/03/2020.    Functional Status:  In your present state of health, do you have any difficulty performing the following activities: 06/12/2020  Hearing? N  Vision? N  Difficulty concentrating or making decisions? N  Walking or climbing stairs? N  Dressing or bathing? N  Some recent data might be hidden    Fall/Depression Screening: Fall Risk  07/24/2020 03/28/2020 04/01/2019  Falls in the past year? 0 0 0  Number falls in past yr: - 0 0  Injury with Fall? - 0  0  Risk for fall due to : - - No Fall Risks  Follow up - - Falls prevention discussed   PHQ 2/9 Scores 07/24/2020 04/01/2019  PHQ - 2 Score 0 0    Assessment:   Care Plan Care Plan : Diabetes Type 2 (Adult)  Updates made by [name], RN since 08/03/2020 12:00 AM     Problem: Glycemic Management (Diabetes, Type 2)      Long-Range Goal: Glycemic Management Optimized evidenced by A1C decreased to </= 9   Start Date: 06/01/2020  Expected End Date: 08/30/2020  This Visit's Progress: On track  Recent Progress: On track  Priority: Medium   Note:   Provided medical nutrition therapy and development of individualized eating.  Promoted self-monitoring of blood glucose levels.   4/21 - Girlfriend educated on diabetes management as well as chemo treatment      Goals Addressed             This Visit's Progress    COMPLETED: [facility] - Make and Keep All Appointments         Timeframe:  Short-Term Goal Priority:  Medium Start Date:      4/21               Expected End Date:   5/21              - ask family or friend for a ride - call to cancel if needed - keep a calendar with appointment dates    Why is this important?   Part of staying healthy is seeing the doctor for follow-up care.  If you forget your appointments, there are some things you can do to stay on track.    Notes: Encouraged to call endocrinologist  12/9 - missed PCP appointment, advised of importance of calling to reschedule  2/15 - Call placed to PCP office to inquire about next office visit  4/21 - Girlfriend report member's cancer has gotten worse, will start chemo next week.  Will have port placed next Monday.  5/25 - Remains active with cancer center, several appointments per week.  Will see endocrinologist on 6/13      [facility] - Monitor and Manage My Blood Sugar   On track      Timeframe:  Short-Term Goal Priority:  High Start Date:   6/23                    Expected End Date:  7/23 (reset)  Barriers: Health Behaviors                 - check blood sugar at prescribed times - check blood sugar before and after exercise - take the blood sugar log to all doctor visits - take the blood sugar meter to all doctor visits    Why is this important?   Checking your blood sugar at home helps to keep it from getting very high or very low.  Writing the results in a diary or log helps the doctor know how to care for you.  Your blood sugar log should have the time, date and the results.  Also, write down the amount of insulin or other medicine  that you take.  Other information, like what you ate, exercise done and how you were feeling, will also be helpful.     Notes:   12/9 - Reminded of diabetic diet and encouraged to continue medication adherence  2/15 - Reviewed medication changes and importance  of checking blood sugars to decrease risk of hypoglycemic episodes  4/21 - Girlfriend aware of importance or managing blood sugars in addition to cancer treatments  5/25 - Girlfriend report blood sugars have remained less than 200 but not at 130 with fasting as recommended.  He is on his last day of 28 units, will increase to 30 units tomorrow per order if glucose remains greater then 130  6/23 - Per girlfriend, member continues to have difficulty managing blood sugars.  Has been as low as 59 and as high as 300's.  Will send more DM education, they will continue to monitor      [facility] - Set My Target A1C         Timeframe:  Long-Range Goal Priority:  Medium Start Date:       6/23                    Expected End Date:  9/23  Barriers: Health Behaviors Knowledge                      - set target A1C less than 8    Why is this important?   Your target A1C is decided together by you and your doctor.  It is based on several things like your age and other health issues.    Notes:   12/9 - Encouraged again to contact new endocrinologist to schedule appointment  2/15 - Call place to PCP office to request A1C  4/21 - Appointment scheduled with Dr. Dwyane Dee for 5/16  5/25 - Reminded member/girlfriend of proper diabetic management.  Encouraged to stop late night/over night snacking.    6/23 - [name] was recommended, but unable to afford.  Referral placed to pharmacy team for suggestions         Plan:  Follow-up: Patient agrees to Care Plan and Follow-up. Follow-up in 1 month(s)   [name], RN, MSN [place] [phone]

## 2020-08-04 NOTE — Patient Outreach (Signed)
[place] [facility]) Care Management  08/04/2020  Stephen Lynch [birth year] [id number]  Referral for medication assistance from [facility], RN sent to [facility] Management Assistant [phone]

## 2020-08-07 DIAGNOSIS — I10 Essential (primary) hypertension: Secondary | ICD-10-CM | POA: Diagnosis not present

## 2020-08-07 DIAGNOSIS — E1169 Type 2 diabetes mellitus with other specified complication: Secondary | ICD-10-CM | POA: Diagnosis not present

## 2020-08-10 ENCOUNTER — Inpatient Hospital Stay: Payer: Medicare Other

## 2020-08-10 ENCOUNTER — Inpatient Hospital Stay (HOSPITAL_BASED_OUTPATIENT_CLINIC_OR_DEPARTMENT_OTHER): Payer: Medicare Other | Admitting: Oncology

## 2020-08-10 ENCOUNTER — Other Ambulatory Visit: Payer: Self-pay

## 2020-08-10 VITALS — BP 158/64 | HR 67 | Temp 96.7°F | Resp 18 | Ht 73.0 in | Wt 228.8 lb

## 2020-08-10 DIAGNOSIS — E1142 Type 2 diabetes mellitus with diabetic polyneuropathy: Secondary | ICD-10-CM

## 2020-08-10 DIAGNOSIS — E041 Nontoxic single thyroid nodule: Secondary | ICD-10-CM

## 2020-08-10 DIAGNOSIS — C61 Malignant neoplasm of prostate: Secondary | ICD-10-CM

## 2020-08-10 DIAGNOSIS — Z192 Hormone resistant malignancy status: Secondary | ICD-10-CM | POA: Diagnosis not present

## 2020-08-10 DIAGNOSIS — M130 Polyarthritis, unspecified: Secondary | ICD-10-CM | POA: Diagnosis not present

## 2020-08-10 DIAGNOSIS — D6481 Anemia due to antineoplastic chemotherapy: Secondary | ICD-10-CM | POA: Diagnosis not present

## 2020-08-10 DIAGNOSIS — Z5111 Encounter for antineoplastic chemotherapy: Secondary | ICD-10-CM | POA: Diagnosis not present

## 2020-08-10 DIAGNOSIS — D630 Anemia in neoplastic disease: Secondary | ICD-10-CM | POA: Diagnosis not present

## 2020-08-10 DIAGNOSIS — I10 Essential (primary) hypertension: Secondary | ICD-10-CM | POA: Diagnosis not present

## 2020-08-10 DIAGNOSIS — E119 Type 2 diabetes mellitus without complications: Secondary | ICD-10-CM | POA: Diagnosis not present

## 2020-08-10 DIAGNOSIS — E7849 Other hyperlipidemia: Secondary | ICD-10-CM | POA: Diagnosis not present

## 2020-08-10 DIAGNOSIS — Z95828 Presence of other vascular implants and grafts: Secondary | ICD-10-CM

## 2020-08-10 DIAGNOSIS — C7951 Secondary malignant neoplasm of bone: Secondary | ICD-10-CM | POA: Diagnosis not present

## 2020-08-10 LAB — CMP (CANCER CENTER ONLY)
ALT: 6 U/L (ref 0–44)
AST: 13 U/L — ABNORMAL LOW (ref 15–41)
Albumin: 3.5 g/dL (ref 3.5–5.0)
Alkaline Phosphatase: 337 U/L — ABNORMAL HIGH (ref 38–126)
Anion gap: 6 (ref 5–15)
BUN: 16 mg/dL (ref 8–23)
CO2: 24 mmol/L (ref 22–32)
Calcium: 9 mg/dL (ref 8.9–10.3)
Chloride: 109 mmol/L (ref 98–111)
Creatinine: 0.85 mg/dL (ref 0.61–1.24)
GFR, Estimated: >60 mL/min (ref >60)
Glucose, Bld: 147 mg/dL — ABNORMAL HIGH (ref 70–99)
Potassium: 4.2 mmol/L (ref 3.5–5.1)
Sodium: 139 mmol/L (ref 135–145)
Total Bilirubin: 0.3 mg/dL (ref 0.3–1.2)
Total Protein: 6.8 g/dL (ref 6.5–8.1)

## 2020-08-10 LAB — CBC WITH DIFFERENTIAL (CANCER CENTER ONLY)
Abs Immature Granulocytes: 0.07 10*3/uL (ref 0.00–0.07)
Basophils Absolute: 0 10*3/uL (ref 0.0–0.1)
Basophils Relative: 0 %
Eosinophils Absolute: 0 10*3/uL (ref 0.0–0.5)
Eosinophils Relative: 0 %
HCT: 25.5 % — ABNORMAL LOW (ref 39.0–52.0)
Hemoglobin: 8.1 g/dL — ABNORMAL LOW (ref 13.0–17.0)
Immature Granulocytes: 1 %
Lymphocytes Relative: 27 %
Lymphs Abs: 1.3 10*3/uL (ref 0.7–4.0)
MCH: 30.5 pg (ref 26.0–34.0)
MCHC: 31.8 g/dL (ref 30.0–36.0)
MCV: 95.9 fL (ref 80.0–100.0)
Monocytes Absolute: 0.4 10*3/uL (ref 0.1–1.0)
Monocytes Relative: 9 %
Neutro Abs: 3 10*3/uL (ref 1.7–7.7)
Neutrophils Relative %: 63 %
Platelet Count: 141 10*3/uL — ABNORMAL LOW (ref 150–400)
RBC: 2.66 MIL/uL — ABNORMAL LOW (ref 4.22–5.81)
RDW: 22.9 % — ABNORMAL HIGH (ref 11.5–15.5)
WBC Count: 4.9 10*3/uL (ref 4.0–10.5)
nRBC: 1.6 % — ABNORMAL HIGH (ref 0.0–0.2)

## 2020-08-10 MED ORDER — SODIUM CHLORIDE 0.9 % IV SOLN
75.0000 mg/m2 | Freq: Once | INTRAVENOUS | Status: AC
  Administered 2020-08-10: 170 mg via INTRAVENOUS
  Filled 2020-08-10: qty 17

## 2020-08-10 MED ORDER — SODIUM CHLORIDE 0.9% FLUSH
10.0000 mL | INTRAVENOUS | Status: DC | PRN
  Administered 2020-08-10 (×2): 10 mL
  Filled 2020-08-10: qty 10

## 2020-08-10 MED ORDER — SODIUM CHLORIDE 0.9 % IV SOLN
10.0000 mg | Freq: Once | INTRAVENOUS | Status: AC
  Administered 2020-08-10: 10 mg via INTRAVENOUS
  Filled 2020-08-10: qty 10

## 2020-08-10 MED ORDER — HEPARIN SOD (PORK) LOCK FLUSH 100 UNIT/ML IV SOLN
500.0000 [IU] | Freq: Once | INTRAVENOUS | Status: AC | PRN
  Administered 2020-08-10: 500 [IU]
  Filled 2020-08-10: qty 5

## 2020-08-10 MED ORDER — SODIUM CHLORIDE 0.9 % IV SOLN
Freq: Once | INTRAVENOUS | Status: AC
  Filled 2020-08-10: qty 250

## 2020-08-10 MED ORDER — SODIUM CHLORIDE 0.9% FLUSH
10.0000 mL | INTRAVENOUS | Status: DC | PRN
  Administered 2020-08-10: 10 mL via INTRAVENOUS
  Filled 2020-08-10: qty 10

## 2020-08-10 NOTE — Progress Notes (Signed)
Hematology and Oncology Follow Up Visit  Stephen Lynch [id number] [birth year] 75 y.o. 08/10/2020 12:22 PM Stephen Lynch, [name], Stephen Rude, MD   Principle Diagnosis: 77 year old man with castration-resistant advanced prostate cancer with disease to the bone and a PSA of 193 diagnosed in February 2021.    Prior Therapy:  He status post CT-guided biopsy of the right iliac bone.  [name] started on April 20, 2019.  Xtandi 160 mg daily started in April 2021.  Therapy discontinued in March 2022 due to progression of disease.  Zytiga 1000 mg daily with prednisone 5 mg daily started in March 2022.   Current therapy:    Eligard 30 mg started in April 2021.  Next injection will be after August 27 2020.  Taxotere chemotherapy 75 mg per metered square started on June 08, 2020.  He is here for cycle 4 of therapy.   Interim History: Stephen Lynch presents today for a follow-up visit.  Since the last visit, he reports no major changes in his health.  He continues to notice improvement in his health status and performance status moving forward.  His bone pain has significantly improved and his mobility has also continued to improve on chemotherapy.  He denies any nausea, vomiting or abdominal pain.  He denies any hospitalization or illnesses.  Medications: Unchanged on review. Current Outpatient Medications  Medication Sig Dispense Refill   acetaminophen (TYLENOL) 500 MG tablet Take 500 mg by mouth every 6 (six) hours as needed for mild pain.     ASPIRIN 81 PO Take 81 mg by mouth daily.      Calcium-Magnesium-Zinc 333-133-5 MG TABS Take 1 tablet by mouth daily.     Chromium 1 MG CAPS Take by mouth.     Continuous Blood Gluc Sensor (FREESTYLE LIBRE 2 SENSOR) MISC APPLY EVERY 14 DAYS, CHECK GLUCOSE BEFORE EACH MEAL, 2 HRS AFTER, IN THE MORNING AND BEFORE BEDTIME     dapagliflozin propanediol (FARXIGA) 5 MG TABS tablet Take 1 tablet (5 mg total) by mouth daily. 30 tablet 3   doxycycline (VIBRA-TABS) 100 MG  tablet      DULoxetine (CYMBALTA) 60 MG capsule Take 60 mg by mouth daily.     ibuprofen (ADVIL) 200 MG tablet Take 200 mg by mouth every 6 (six) hours as needed for moderate pain.     insulin lispro (HUMALOG) 100 UNIT/ML KwikPen Junior Inject 0.2 mLs (20 Units total) into the skin 3 (three) times daily after meals. (Patient taking differently: Inject 7-25 Units into the skin 3 (three) times daily after meals. Per sliding scale) 3 mL 6   lidocaine (LIDODERM) 5 % Place 2 patches onto the skin daily. Remove & Discard patch within 12 hours or as directed by MD 30 patch 0   lidocaine-prilocaine (EMLA) cream Apply 1 application topically as needed. 30 g 0   loperamide (IMODIUM) 1 MG/5ML solution Take by mouth as needed for diarrhea or loose stools.     magnesium 30 MG tablet Take 30 mg by mouth 2 (two) times daily.     Menthol, Topical Analgesic, (BIOFREEZE EX) Apply topically.     Menthol-Camphor (TIGER BALM ARTHRITIS RUB EX) Apply 1 application topically 2 (two) times daily as needed (back pain).     ondansetron (ZOFRAN) 4 MG tablet TAKE 1 TABLET BY MOUTH EVERY 6 HOURS AS NEEDED FOR NAUSEA 20 tablet 0   oxyCODONE-acetaminophen (PERCOCET) 7.5-325 MG tablet Take 1 tablet by mouth 4 (four) times daily as needed. 60 tablet 0   prochlorperazine (COMPAZINE)  10 MG tablet Take 1 tablet (10 mg total) by mouth every 6 (six) hours as needed for nausea or vomiting. 30 tablet 0   rosuvastatin (CRESTOR) 10 MG tablet Take 1 tablet (10 mg total) by mouth daily. 90 tablet 3   TRESIBA FLEXTOUCH 100 UNIT/ML SOPN FlexTouch Pen Inject 10 Units into the skin at bedtime.      ULTRAM 50 MG tablet Take 50 mg by mouth every 6 (six) hours as needed for pain.     No current facility-administered medications for this visit.     Allergies: No Known Allergies    Physical Exam:     Blood pressure (!) 158/64, pulse 67, temperature (!) 96.7 F (35.9 C), temperature source Tympanic, resp. rate 18, height 6\' 1"  (1.854 m),  weight 228 lb 12.8 oz (103.8 kg), SpO2 100 %.    ECOG: 1     General appearance: Alert, awake without any distress. Head: Atraumatic without abnormalities Oropharynx: Without any thrush or ulcers. Eyes: No scleral icterus. Lymph nodes: No lymphadenopathy noted in the cervical, supraclavicular, or axillary nodes Heart:regular rate and rhythm, without any murmurs or gallops.   Lung: Clear to auscultation without any rhonchi, wheezes or dullness to percussion. Abdomin: Soft, nontender without any shifting dullness or ascites. Musculoskeletal: No clubbing or cyanosis. Neurological: No motor or sensory deficits. Skin: No rashes or lesions.               Lab Results: Lab Results  Component Value Date   WBC 5.5 07/20/2020   HGB 8.1 (L) 07/20/2020   HCT 25.4 (L) 07/20/2020   MCV 93.0 07/20/2020   PLT 145 (L) 07/20/2020     Chemistry      Component Value Date/Time   NA 140 07/20/2020 1110   K 4.6 07/20/2020 1110   CL 106 07/20/2020 1110   CO2 27 07/20/2020 1110   BUN 18 07/20/2020 1110   CREATININE 0.78 07/20/2020 1110      Component Value Date/Time   CALCIUM 9.0 07/20/2020 1110   ALKPHOS 428 (H) 07/20/2020 1110   AST 12 (L) 07/20/2020 1110   ALT 9 07/20/2020 1110   BILITOT 0.3 07/20/2020 1110        Results for Stephen Lynch (MRN [id number]) as of 08/10/2020 12:25  Ref. Range 06/08/2020 12:28 06/29/2020 11:33 07/20/2020 11:10  Prostate Specific Ag, Serum Latest Ref Range: 0.0 - 4.0 ng/mL 129.0 (H) 75.9 (H) 56.8 (H)      Impression and Plan:   75 year old man with:  1.   Advanced prostate cancer with disease to the bone diagnosed in February 2022.  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  Risks and benefits of continuing Taxotere chemotherapy were discussed at this time.  He continues to have excellent clinical and PSA response.  Complications including nausea, vomiting, myelosuppression, neutropenia and possible sepsis were reviewed.  He is  agreeable to continue at this time.   2.  Androgen deprivation: Next Eligard will be given with the subsequent chemotherapy cycle.  Complications including weight gain, hot flashes and others were reiterated.   3.  Bone directed therapy: I encouraged calcium and vitamin D supplements and also obtaining dental clearance.  Consideration for Xgeva.   4.  Prognosis and goals of care: Therapy remains palliative although aggressive measures are warranted given his reasonable performance status.   5.  Anemia: Hemoglobin has been around 8 and mildly symptomatic.  This is related to malignancy and chemotherapy.  Risks and benefits of packed red cell  transfusion were reviewed.  This option will be deferred for the time being.  6.  Bone pain: Overall improved with his chemotherapy treatment.  His pain is related to metastatic disease.  7.  IV access: Port-A-Cath currently in use without any issues.   8.  Follow-up: In 3 weeks for for repeat evaluation.    30  minutes were spent on this encounter.  The time was dedicated to reviewing laboratory data, disease status update, addressing complications related to his cancer and cancer therapy.   Zola Button, MD 6/30/202212:22 PM

## 2020-08-10 NOTE — Patient Instructions (Signed)
[facility]   ?Discharge Instructions: ?Thank you for choosing [facility] to provide your oncology and hematology care.  ? ?If you have a lab appointment with the Cancer Center, please go directly to the Cancer Center and check in at the registration area. ?  ?Wear comfortable clothing and clothing appropriate for easy access to any Portacath or PICC line.  ? ?We strive to give you quality time with your provider. You may need to reschedule your appointment if you arrive late (15 or more minutes).  Arriving late affects you and other patients whose appointments are after yours.  Also, if you miss three or more appointments without notifying the office, you may be dismissed from the clinic at the provider?s discretion.    ?  ?For prescription refill requests, have your pharmacy contact our office and allow 72 hours for refills to be completed.   ? ?Today you received the following chemotherapy and/or immunotherapy agents: Docetaxel (Taxotere)    ?  ?To help prevent nausea and vomiting after your treatment, we encourage you to take your nausea medication as directed. ? ?BELOW ARE SYMPTOMS THAT SHOULD BE REPORTED IMMEDIATELY: ?*FEVER GREATER THAN 100.4 F (38 ?C) OR HIGHER ?*CHILLS OR SWEATING ?*NAUSEA AND VOMITING THAT IS NOT CONTROLLED WITH YOUR NAUSEA MEDICATION ?*UNUSUAL SHORTNESS OF BREATH ?*UNUSUAL BRUISING OR BLEEDING ?*URINARY PROBLEMS (pain or burning when urinating, or frequent urination) ?*BOWEL PROBLEMS (unusual diarrhea, constipation, pain near the anus) ?TENDERNESS IN MOUTH AND THROAT WITH OR WITHOUT PRESENCE OF ULCERS (sore throat, sores in mouth, or a toothache) ?UNUSUAL RASH, SWELLING OR PAIN  ?UNUSUAL VAGINAL DISCHARGE OR ITCHING  ? ?Items with * indicate a potential emergency and should be followed up as soon as possible or go to the Emergency Department if any problems should occur. ? ?Please show the CHEMOTHERAPY ALERT CARD or IMMUNOTHERAPY ALERT CARD at  check-in to the Emergency Department and triage nurse. ? ?Should you have questions after your visit or need to cancel or reschedule your appointment, please contact [facility]  Dept: [phone]  and follow the prompts.  Office hours are 8:00 a.m. to 4:30 p.m. Monday - Friday. Please note that voicemails left after 4:00 p.m. may not be returned until the following business day.  We are closed weekends and major holidays. You have access to a nurse at all times for urgent questions. Please call the main number to the clinic Dept: [phone] and follow the prompts. ? ? ?For any non-urgent questions, you may also contact your provider using MyChart. We now offer e-Visits for anyone 18 and older to request care online for non-urgent symptoms. For details visit [email or web address]. ?  ?Also download the MyChart app! Go to the app store, search "MyChart", open the app, select [facility], and log in with your MyChart username and password. ? ?Due to Covid, a mask is required upon entering the hospital/clinic. If you do not have a mask, one will be given to you upon arrival. For doctor visits, patients may have 1 support person aged 18 or older with them. For treatment visits, patients cannot have anyone with them due to current Covid guidelines and our immunocompromised population.  ? ?

## 2020-08-11 ENCOUNTER — Telehealth: Payer: Self-pay | Admitting: *Deleted

## 2020-08-11 LAB — PROSTATE-SPECIFIC AG, SERUM (LABCORP): Prostate Specific Ag, Serum: 52.1 ng/mL — ABNORMAL HIGH (ref 0.0–4.0)

## 2020-08-11 NOTE — Telephone Encounter (Signed)
-----   Message from [name], MD sent at 08/11/2020  8:54 AM EDT ----- Please let him know his PSA is down again

## 2020-08-11 NOTE — Telephone Encounter (Signed)
LM with note below. To call if has questions 

## 2020-08-12 ENCOUNTER — Inpatient Hospital Stay: Payer: Medicare Other | Attending: Oncology

## 2020-08-12 ENCOUNTER — Other Ambulatory Visit: Payer: Self-pay

## 2020-08-12 VITALS — BP 139/74 | HR 69 | Temp 98.8°F | Resp 18

## 2020-08-12 DIAGNOSIS — Z5111 Encounter for antineoplastic chemotherapy: Secondary | ICD-10-CM | POA: Insufficient documentation

## 2020-08-12 DIAGNOSIS — C61 Malignant neoplasm of prostate: Secondary | ICD-10-CM | POA: Insufficient documentation

## 2020-08-12 DIAGNOSIS — Z79899 Other long term (current) drug therapy: Secondary | ICD-10-CM | POA: Diagnosis not present

## 2020-08-12 DIAGNOSIS — Z191 Hormone sensitive malignancy status: Secondary | ICD-10-CM | POA: Diagnosis not present

## 2020-08-12 DIAGNOSIS — C7951 Secondary malignant neoplasm of bone: Secondary | ICD-10-CM | POA: Diagnosis not present

## 2020-08-12 MED ORDER — PEGFILGRASTIM-JMDB 6 MG/0.6ML ~~LOC~~ SOSY
6.0000 mg | PREFILLED_SYRINGE | Freq: Once | SUBCUTANEOUS | Status: AC
  Administered 2020-08-12: 6 mg via SUBCUTANEOUS

## 2020-08-12 NOTE — Patient Instructions (Signed)

## 2020-08-16 ENCOUNTER — Ambulatory Visit
Admission: RE | Admit: 2020-08-16 | Discharge: 2020-08-16 | Disposition: A | Discharge status: Home / Self Care | Payer: Medicare Other | Source: Ambulatory Visit | Attending: Endocrinology | Admitting: Endocrinology

## 2020-08-16 DIAGNOSIS — E041 Nontoxic single thyroid nodule: Secondary | ICD-10-CM | POA: Diagnosis not present

## 2020-08-18 ENCOUNTER — Other Ambulatory Visit (INDEPENDENT_AMBULATORY_CARE_PROVIDER_SITE_OTHER): Payer: Medicare Other

## 2020-08-18 ENCOUNTER — Other Ambulatory Visit: Payer: Self-pay

## 2020-08-18 DIAGNOSIS — E1165 Type 2 diabetes mellitus with hyperglycemia: Secondary | ICD-10-CM | POA: Diagnosis not present

## 2020-08-18 DIAGNOSIS — Z794 Long term (current) use of insulin: Secondary | ICD-10-CM | POA: Diagnosis not present

## 2020-08-18 LAB — BASIC METABOLIC PANEL
BUN: 15 mg/dL (ref 6–23)
CO2: 27 mEq/L (ref 19–32)
Calcium: 9 mg/dL (ref 8.4–10.5)
Chloride: 105 mEq/L (ref 96–112)
Creatinine, Ser: 0.84 mg/dL (ref 0.40–1.50)
GFR: 86.49 mL/min (ref >60.00)
Glucose, Bld: 154 mg/dL — ABNORMAL HIGH (ref 70–99)
Potassium: 4.1 mEq/L (ref 3.5–5.1)
Sodium: 140 mEq/L (ref 135–145)

## 2020-08-18 LAB — HEMOGLOBIN A1C: Hgb A1c MFr Bld: 8.5 % — ABNORMAL HIGH (ref 4.6–6.5)

## 2020-08-21 ENCOUNTER — Other Ambulatory Visit: Payer: Self-pay | Admitting: Oncology

## 2020-08-21 NOTE — Progress Notes (Signed)
Patient ID: Stephen Lynch, male   DOB: 1945-02-27, 75 y.o.   MRN: [id number]           Reason for Appointment: Endocrinology follow-up   PCP: Dr. Criss Rosales   History of Present Illness:          Date of diagnosis of type 2 diabetes mellitus: 2007?       Background history:   He has been on insulin since about the time of diagnosis.  He thinks initially oral agents that were prescribed did not work No detailed history of his previous treatment is available and A1c from last year was over 12%  Recent history:   Most recent A1c is 8.5  Previously 10% done on 06/26/2020 Fructosamine is last 350  INSULIN regimen is: [place] 30 U at bedtime, Humalog as needed Non-insulin hypoglycemic drugs the patient is taking are: none  Current management, blood sugar patterns and problems identified:  He has been using the freestyle libre sensor but even though his meter was reset he still has the incorrect date on the reader Information is available below Even though he has been told to increase his [place] by 4 units his fasting readings are still significantly high And time on his reader are inaccurate and his time is reading 12 hours behind and update 20th May He was told specifically to take HUMALOG before each meal and the doses He is however not understanding the need to cover his meals with a mealtime dose, was recommended up to 25 units He is again only using a sliding scale He was supposed to start [place] but he says this was not covered by his insurance and he did not let us know His weight is about the same or higher   Typical meal intake: Breakfast is usually cereal and almond milk or grits and eggs.  Lunch fruits, tuna or other seafood dinner is rice, vegetables and protein.                Snacks are nuts, fruits, ice cream, chocolate  Exercise: minimal, limited by weakness  Glucose monitoring:  done 1-2 times a day         Glucometer: Libre     His blood sugars are now highest  after breakfast and variably high after dinner Blood sugars are overall persistently high overnight No hypoglycemia Average sugar over 200 throughout the day and night    Data for the last 2 weeks from the [place] version 2        CGM use % of time 82  2-week average/GV 228  Time in range      30%  % Time Above 180/250 32+37  % Time above 250   % Time Below 70 1     PRE-MEAL Fasting Lunch Dinner Bedtime Overall  Glucose range:       Averages: 175 226 236  228   POST-MEAL PC Breakfast PC Lunch PC Dinner  Glucose range:     Averages: 233 57 253     Dietician visit, most recent: never  Weight history:  Wt Readings from Last 3 Encounters:  08/23/20 230 lb 6.4 oz (104.5 kg)  08/10/20 228 lb 12.8 oz (103.8 kg)  07/24/20 224 lb (101.6 kg)    Glycemic control:   Lab Results  Component Value Date   HGBA1C 8.5 (H) 08/18/2020   HGBA1C 10.0 (A) 06/26/2020   Lab Results  Component Value Date   LDLCALC 72 07/18/2020   CREATININE 0.84 08/18/2020  No results found for: [facility]  Lab Results  Component Value Date   FRUCTOSAMINE 350 (H) 07/18/2020    Lab on 08/18/2020  Component Date Value Ref Range Status   Sodium 08/18/2020 140  135 - 145 mEq/L Final   Potassium 08/18/2020 4.1  3.5 - 5.1 mEq/L Final   Chloride 08/18/2020 105  96 - 112 mEq/L Final   CO2 08/18/2020 27  19 - 32 mEq/L Final   Glucose, Bld 08/18/2020 154 (A) 70 - 99 mg/dL Final   BUN 08/18/2020 15  6 - 23 mg/dL Final   Creatinine, Ser 08/18/2020 0.84  0.40 - 1.50 mg/dL Final   GFR 08/18/2020 86.49  >60.00 mL/min Final   Calculated using the CKD-EPI Creatinine Equation (2021)   Calcium 08/18/2020 9.0  8.4 - 10.5 mg/dL Final   Hgb A1c MFr Bld 08/18/2020 8.5 (A) 4.6 - 6.5 % Final   Glycemic Control Guidelines for People with Diabetes:Non Diabetic:  <6%Goal of Therapy: <7%Additional Action Suggested:  >8%     Allergies as of 08/23/2020   No Known Allergies      Medication List         Accurate as of August 23, 2020  4:03 PM. If you have any questions, ask your nurse or doctor.          acetaminophen 500 MG tablet Commonly known as: TYLENOL Take 500 mg by mouth every 6 (six) hours as needed for mild pain.   ASPIRIN 81 PO Take 81 mg by mouth daily.   BIOFREEZE EX Apply topically.   Calcium-Magnesium-Zinc 333-133-5 MG Tabs Take 1 tablet by mouth daily.   Chromium 1 MG Caps Take by mouth.   dapagliflozin propanediol 5 MG Tabs tablet Commonly known as: Farxiga Take 1 tablet (5 mg total) by mouth daily.   doxycycline 100 MG tablet Commonly known as: VIBRA-TABS   DULoxetine 60 MG capsule Commonly known as: CYMBALTA Take 60 mg by mouth daily.   FreeStyle Libre 2 Sensor Misc APPLY EVERY 14 DAYS, CHECK GLUCOSE BEFORE EACH MEAL, 2 HRS AFTER, IN THE MORNING AND BEFORE BEDTIME   ibuprofen 200 MG tablet Commonly known as: ADVIL Take 200 mg by mouth every 6 (six) hours as needed for moderate pain.   insulin lispro 100 UNIT/ML KwikPen Junior Generic drug: insulin lispro Inject 0.2 mLs (20 Units total) into the skin 3 (three) times daily after meals. What changed:  how much to take additional instructions   lidocaine 5 % Commonly known as: LIDODERM Place 2 patches onto the skin daily. Remove & Discard patch within 12 hours or as directed by MD   lidocaine-prilocaine cream Commonly known as: EMLA Apply 1 application topically as needed.   loperamide 1 MG/5ML solution Commonly known as: IMODIUM Take by mouth as needed for diarrhea or loose stools.   magnesium 30 MG tablet Take 30 mg by mouth 2 (two) times daily.   ondansetron 4 MG tablet Commonly known as: ZOFRAN TAKE 1 TABLET BY MOUTH EVERY 6 HOURS AS NEEDED FOR NAUSEA   oxyCODONE-acetaminophen 7.5-325 MG tablet Commonly known as: PERCOCET Take 1 tablet by mouth 4 (four) times daily as needed.   prochlorperazine 10 MG tablet Commonly known as: COMPAZINE TAKE 1 TABLET BY MOUTH EVERY 6 HOURS AS  NEEDED FOR NAUSEA OR VOMITING.   rosuvastatin 10 MG tablet Commonly known as: CRESTOR Take 1 tablet (10 mg total) by mouth daily.   TIGER BALM ARTHRITIS RUB EX Apply 1 application topically 2 (two) times daily as  needed (back pain).   [name] FlexTouch 100 UNIT/ML FlexTouch Pen Generic drug: insulin degludec Inject 10 Units into the skin at bedtime.   Ultram 50 MG tablet Generic drug: traMADol Take 50 mg by mouth every 6 (six) hours as needed for pain.        Allergies: No Known Allergies  Past Medical History:  Diagnosis Date   Cancer ([place])    prostate   Diabetes mellitus without complication ([place])    Dyslipidemia    Hypertension     Past Surgical History:  Procedure Laterality Date   EYE SURGERY     IR IMAGING GUIDED PORT INSERTION  06/12/2020    Family History  Problem Relation Age of Onset   CAD Mother 59   Other Father 51       age   Other Brother 17       unknown cause    Social History:  reports that he has quit smoking. His smoking use included cigarettes. He has a 7.50 pack-year smoking history. He has never used smokeless tobacco. He reports previous alcohol use. He reports previous drug use.   Review of Systems   Lipid history: Has been treated with rosuvastatin by PCP    Lab Results  Component Value Date   CHOL 142 07/18/2020   HDL 53.80 07/18/2020   LDLCALC 72 07/18/2020   TRIG 80.0 07/18/2020   CHOLHDL 3 07/18/2020           Hypertension: Has been present previously on enalapril, managed by PCP  BP Readings from Last 3 Encounters:  08/23/20 (!) 142/78  08/12/20 139/74  08/10/20 (!) 158/64    Most recent eye exam was in 2022  Most recent foot exam: 5/22  Currently known complications of diabetes: Erectile dysfunction  MULTINODULAR goiter:  He has 3 abnormal lesions and radiologist has recommended biopsy of 2 of the solid nodules with highest TI-RADS 4 Location: Right; Inferior   Maximum size: 2.3 cm; Other 2 dimensions: 2.1  x 2.1 cm   Composition: solid/almost completely solid (2)   Echogenicity: hypoechoic (2)    Lab Results  Component Value Date   TSH 0.76 07/18/2020     LABS:  Lab on 08/18/2020  Component Date Value Ref Range Status   Sodium 08/18/2020 140  135 - 145 mEq/L Final   Potassium 08/18/2020 4.1  3.5 - 5.1 mEq/L Final   Chloride 08/18/2020 105  96 - 112 mEq/L Final   CO2 08/18/2020 27  19 - 32 mEq/L Final   Glucose, Bld 08/18/2020 154 (A) 70 - 99 mg/dL Final   BUN 08/18/2020 15  6 - 23 mg/dL Final   Creatinine, Ser 08/18/2020 0.84  0.40 - 1.50 mg/dL Final   GFR 08/18/2020 86.49  >60.00 mL/min Final   Calculated using the CKD-EPI Creatinine Equation (2021)   Calcium 08/18/2020 9.0  8.4 - 10.5 mg/dL Final   Hgb A1c MFr Bld 08/18/2020 8.5 (A) 4.6 - 6.5 % Final   Glycemic Control Guidelines for People with Diabetes:Non Diabetic:  <6%Goal of Therapy: <7%Additional Action Suggested:  >8%     Physical Examination:  BP (!) 142/78   Pulse 76   Ht 6\' 1"  (1.854 m)   Wt 230 lb 6.4 oz (104.5 kg)   SpO2 99%   BMI 30.40 kg/m         ASSESSMENT:  Diabetes type 2  See history of present illness for detailed discussion of current diabetes management, blood sugar patterns and problems identified  Most recent A1c is 8.5 % and is freestyle libre discussed above  Current treatment regimen is only basal bolus insulin but not enough bolus insulin for his meals Also still appears to be needing much more basal insulin with high fasting readings Weight is tending to be higher with trying to increase his insulin He does not understand the need for covering his meals with Humalog and only using correction doses  Hypertension: Followed by PCP but does not appear to be controlled   PLAN:    Glucose monitoring: Continue to use the freestyle libre and the date was changed, if this is still not correct he will get another reader, showed him how to change the date  Diabetes education: Patient  will need significant amount of education including nutritional and he is scheduled to see the diabetes educator today   Therapy changes: TRESIBA dosage will be 36 units for now His wife was present and he was explained also how to titrate this every 3 days x 2 units until morning sugars are under 130, flowsheet was given for this  Today discussed in detail the need for mealtime insulin to cover postprandial spikes, action of mealtime insulin, use of the insulin pen, timing and action of the rapid acting insulin as well as starting dose and dosage titration to target the two-hour reading of under 180 He will likely need at least 10 to 15 units  His wife will try and get information from his insurance to see if Jardiance or Invokana are covered  Preventive care needed: Urine microalbumin, follow-up lipids  GOITER on the left side: Thyroid ultrasound indicates lesions that need to be biopsied and discussed in detail how tissue diagnosis is needed and we will schedule this. He is somewhat reluctant to do it now because of undergoing chemotherapy for prostate cancer   Follow-up: 6 weeks    Patient Instructions  Take 10-15 Humalog with every meal based on meal size  36 Tresiba  Check coverage for Jardiance and Varney Baas 08/23/2020, 4:03 PM   Note: This office note was prepared with Dragon voice recognition system technology. Any transcriptional errors that result from this process are unintentional.

## 2020-08-22 ENCOUNTER — Encounter: Payer: Self-pay | Admitting: Oncology

## 2020-08-23 ENCOUNTER — Ambulatory Visit (INDEPENDENT_AMBULATORY_CARE_PROVIDER_SITE_OTHER): Payer: Medicare Other | Admitting: Endocrinology

## 2020-08-23 ENCOUNTER — Other Ambulatory Visit: Payer: Self-pay

## 2020-08-23 ENCOUNTER — Encounter: Payer: Self-pay | Admitting: Endocrinology

## 2020-08-23 ENCOUNTER — Encounter: Payer: Medicare Other | Attending: Pain Medicine | Admitting: Nutrition

## 2020-08-23 VITALS — BP 142/78 | HR 76 | Ht 73.0 in | Wt 230.4 lb

## 2020-08-23 DIAGNOSIS — E1165 Type 2 diabetes mellitus with hyperglycemia: Secondary | ICD-10-CM | POA: Insufficient documentation

## 2020-08-23 DIAGNOSIS — Z794 Long term (current) use of insulin: Secondary | ICD-10-CM

## 2020-08-23 DIAGNOSIS — E042 Nontoxic multinodular goiter: Secondary | ICD-10-CM | POA: Diagnosis not present

## 2020-08-23 NOTE — Progress Notes (Signed)
Patient is here with his wife to learn about the need for insulin and diet.  He reports taking his insulin at bedtime every night  only 30u.  He has not increased his dose because both wife and he do not know how to do this.  We reviewed the importance of getting FBS under 120, and that by increasing this does q 3 days he will be able to do this most of the time.  Written schedule given to him for this.   We also reviewed the need for humalog and how to adjust this dose up/down when eating more/less food by 1-2 units.  Written instructions given for this as well. He is agreeable with doing this, as we discussed that this is what keeps his blood sugar down after he eats.   Diet: 7AM: 2 toast with jelly and 2 eggs, some days and cereal with 1/2 banana and milk other days.  Written instructions given for need to increase dose of humalog for this meal by 2 units.  He reports good understanding of this. 12-2PM: lunch is variable: some daus he does not eat, and some days he eats out.  Insulin doses for these meals vary and written instructions given for no insulin when not eating,and 15u when eating at fast food restaurant. 6-7PM: this meal is usually between 30-60 grams of carb.  Discussed need for this insulin and to vary the dose depending on if desert (1/2 the time is eating at HS).  He will increase the dose to 17 when eating deserts (ice cream or cookies), He reported good understanding of the need to increase dose and to do this and they had no final questions. Discussed the need to scan the sensor at least before all meals and at bedtime so that Dr. Dwyane Dee can see the full 24 hour picture of his blood sugars.  Date and time were checked on his scanner.

## 2020-08-23 NOTE — Patient Instructions (Signed)
Take 10-15 Humalog with every meal based on meal size  36 Tresiba  Check coverage for Jardiance and Invokana

## 2020-08-31 ENCOUNTER — Other Ambulatory Visit: Payer: Self-pay

## 2020-08-31 ENCOUNTER — Inpatient Hospital Stay: Payer: Medicare Other

## 2020-08-31 ENCOUNTER — Inpatient Hospital Stay (HOSPITAL_BASED_OUTPATIENT_CLINIC_OR_DEPARTMENT_OTHER): Payer: Medicare Other | Admitting: Oncology

## 2020-08-31 VITALS — BP 148/68 | HR 70 | Temp 97.4°F | Resp 18 | Ht 73.0 in | Wt 236.6 lb

## 2020-08-31 DIAGNOSIS — C61 Malignant neoplasm of prostate: Secondary | ICD-10-CM

## 2020-08-31 DIAGNOSIS — C7951 Secondary malignant neoplasm of bone: Secondary | ICD-10-CM | POA: Diagnosis not present

## 2020-08-31 DIAGNOSIS — Z5111 Encounter for antineoplastic chemotherapy: Secondary | ICD-10-CM | POA: Diagnosis not present

## 2020-08-31 DIAGNOSIS — Z95828 Presence of other vascular implants and grafts: Secondary | ICD-10-CM

## 2020-08-31 DIAGNOSIS — Z79899 Other long term (current) drug therapy: Secondary | ICD-10-CM | POA: Diagnosis not present

## 2020-08-31 DIAGNOSIS — Z191 Hormone sensitive malignancy status: Secondary | ICD-10-CM | POA: Diagnosis not present

## 2020-08-31 LAB — CBC WITH DIFFERENTIAL (CANCER CENTER ONLY)
Abs Immature Granulocytes: 0.04 10*3/uL (ref 0.00–0.07)
Basophils Absolute: 0 10*3/uL (ref 0.0–0.1)
Basophils Relative: 0 %
Eosinophils Absolute: 0 10*3/uL (ref 0.0–0.5)
Eosinophils Relative: 0 %
HCT: 26.6 % — ABNORMAL LOW (ref 39.0–52.0)
Hemoglobin: 8.5 g/dL — ABNORMAL LOW (ref 13.0–17.0)
Immature Granulocytes: 1 %
Lymphocytes Relative: 19 %
Lymphs Abs: 1 10*3/uL (ref 0.7–4.0)
MCH: 31.4 pg (ref 26.0–34.0)
MCHC: 32 g/dL (ref 30.0–36.0)
MCV: 98.2 fL (ref 80.0–100.0)
Monocytes Absolute: 0.5 10*3/uL (ref 0.1–1.0)
Monocytes Relative: 10 %
Neutro Abs: 3.5 10*3/uL (ref 1.7–7.7)
Neutrophils Relative %: 70 %
Platelet Count: 152 10*3/uL (ref 150–400)
RBC: 2.71 MIL/uL — ABNORMAL LOW (ref 4.22–5.81)
RDW: 20.3 % — ABNORMAL HIGH (ref 11.5–15.5)
WBC Count: 5.1 10*3/uL (ref 4.0–10.5)
nRBC: 0.6 % — ABNORMAL HIGH (ref 0.0–0.2)

## 2020-08-31 LAB — CMP (CANCER CENTER ONLY)
ALT: 13 U/L (ref 0–44)
AST: 18 U/L (ref 15–41)
Albumin: 3.9 g/dL (ref 3.5–5.0)
Alkaline Phosphatase: 234 U/L — ABNORMAL HIGH (ref 38–126)
Anion gap: 6 (ref 5–15)
BUN: 19 mg/dL (ref 8–23)
CO2: 26 mmol/L (ref 22–32)
Calcium: 8.9 mg/dL (ref 8.9–10.3)
Chloride: 107 mmol/L (ref 98–111)
Creatinine: 0.81 mg/dL (ref 0.61–1.24)
GFR, Estimated: >60 mL/min (ref >60)
Glucose, Bld: 256 mg/dL — ABNORMAL HIGH (ref 70–99)
Potassium: 4.5 mmol/L (ref 3.5–5.1)
Sodium: 139 mmol/L (ref 135–145)
Total Bilirubin: 0.6 mg/dL (ref 0.3–1.2)
Total Protein: 6.9 g/dL (ref 6.5–8.1)

## 2020-08-31 MED ORDER — SODIUM CHLORIDE 0.9 % IV SOLN
Freq: Once | INTRAVENOUS | Status: AC
  Filled 2020-08-31: qty 250

## 2020-08-31 MED ORDER — ONDANSETRON HCL 4 MG PO TABS: 4.0000 mg | ORAL_TABLET | Freq: Four times a day (QID) | ORAL | 1 refills | Status: DC | PRN

## 2020-08-31 MED ORDER — HEPARIN SOD (PORK) LOCK FLUSH 100 UNIT/ML IV SOLN
500.0000 [IU] | Freq: Once | INTRAVENOUS | Status: AC | PRN
Start: 2020-08-31 — End: 2020-08-31
  Administered 2020-08-31: 500 [IU]
  Filled 2020-08-31: qty 5

## 2020-08-31 MED ORDER — SODIUM CHLORIDE 0.9 % IV SOLN
75.0000 mg/m2 | Freq: Once | INTRAVENOUS | Status: AC
  Administered 2020-08-31: 170 mg via INTRAVENOUS
  Filled 2020-08-31: qty 17

## 2020-08-31 MED ORDER — SODIUM CHLORIDE 0.9% FLUSH
10.0000 mL | INTRAVENOUS | Status: AC | PRN
Start: 2020-08-31 — End: 2020-08-31
  Administered 2020-08-31: 10 mL
  Filled 2020-08-31: qty 10

## 2020-08-31 MED ORDER — SODIUM CHLORIDE 0.9 % IV SOLN
10.0000 mg | Freq: Once | INTRAVENOUS | Status: AC
  Administered 2020-08-31: 10 mg via INTRAVENOUS
  Filled 2020-08-31: qty 10

## 2020-08-31 MED ORDER — OXYCODONE-ACETAMINOPHEN 7.5-325 MG PO TABS: 1.0000 | ORAL_TABLET | Freq: Four times a day (QID) | ORAL | 0 refills | Status: DC | PRN

## 2020-08-31 MED ORDER — LIDOCAINE-PRILOCAINE 2.5-2.5 % EX CREA: 1.0000 "application " | TOPICAL_CREAM | CUTANEOUS | 0 refills | Status: DC | PRN

## 2020-08-31 MED ORDER — SODIUM CHLORIDE 0.9% FLUSH
10.0000 mL | INTRAVENOUS | Status: DC | PRN
  Administered 2020-08-31: 10 mL
  Filled 2020-08-31: qty 10

## 2020-08-31 NOTE — Patient Instructions (Signed)
[facility]ICAL [facility]COLOG[facility]   ?Discharge Instructions: ?Thank you for choosing [facility] to provide your oncology and hematology care.  ? ?If you have a lab appointment with the Cancer Center, please go directly to the Cancer Center and check in at the registration area. ?  ?Wear comfortable clothing and clothing appropriate for easy access to any Portacath or PICC line.  ? ?We strive to give you quality time with your provider. You may need to reschedule your appointment if you arrive late (15 or more minutes).  Arriving late affects you and other patients whose appointments are after yours.  Also, if you miss three or more appointments without notifying the office, you may be dismissed from the clinic at the provider?s discretion.    ?  ?For prescription refill requests, have your pharmacy contact our office and allow 72 hours for refills to be completed.   ? ?Today you received the following chemotherapy and/or immunotherapy agents: Docetaxel (Taxotere)    ?  ?To help prevent nausea and vomiting after your treatment, we encourage you to take your nausea medication as directed. ? ?BELOW ARE SYMPTOMS THAT SHOULD BE REPORTED IMMEDIATELY: ?*FEVER GREATER THAN 100.4 F (38 ?C) OR HIGHER ?*CHILLS OR SWEATING ?*NAUSEA AND VOMITING THAT IS NOT CONTROLLED WITH YOUR NAUSEA MEDICATION ?*UNUSUAL SHORTNESS OF BREATH ?*UNUSUAL BRUISING OR BLEEDING ?*URINARY PROBLEMS (pain or burning when urinating, or frequent urination) ?*BOWEL PROBLEMS (unusual diarrhea, constipation, pain near the anus) ?TENDERNESS IN MOUTH AND THROAT WITH OR WITHOUT PRESENCE OF ULCERS (sore throat, sores in mouth, or a toothache) ?UNUSUAL RASH, SWELLING OR PAIN  ?UNUSUAL VAGINAL DISCHARGE OR ITCHING  ? ?Items with * indicate a potential emergency and should be followed up as soon as possible or go to the Emergency Department if any problems should occur. ? ?Please show the CHEMOTHERAPY ALERT CARD or IMMUNOTHERAPY ALERT CARD at  check-in to the Emergency Department and triage nurse. ? ?Should you have questions after your visit or need to cancel or reschedule your appointment, please contact [facility]  Dept: [phone]  and follow the prompts.  Office hours are 8:00 a.m. to 4:30 p.m. Monday - Friday. Please note that voicemails left after 4:00 p.m. may not be returned until the following business day.  We are closed weekends and major holidays. You have access to a nurse at all times for urgent questions. Please call the main number to the clinic Dept: [phone] and follow the prompts. ? ? ?For any non-urgent questions, you may also contact your provider using MyChart. We now offer e-Visits for anyone 18 and older to request care online for non-urgent symptoms. For details visit [email or web address]. ?  ?Also download the MyChart app! Go to the app store, search "MyChart", open the app, select [facility], and log in with your MyChart username and password. ? ?Due to Covid, a mask is required upon entering the hospital/clinic. If you do not have a mask, one will be given to you upon arrival. For doctor visits, patients may have 1 support person aged 18 or older with them. For treatment visits, patients cannot have anyone with them due to current Covid guidelines and our immunocompromised population.  ? ?

## 2020-08-31 NOTE — Progress Notes (Signed)
Hematology and Oncology Follow Up Visit  Stephen Lynch [id number] [birth year] 75 y.o. 08/31/2020 11:45 AM Stephen Lynch, [name], Stephen Rude, MD   Principle Diagnosis: 22 year old man with advanced prostate cancer with disease to the bone diagnosed in February 2021.  He has castration-resistant disease after presenting with a PSA 193.   Prior Therapy:  He status post CT-guided biopsy of the right iliac bone.  Stephen Lynch started on April 20, 2019.  Xtandi 160 mg daily started in April 2021.  Therapy discontinued in March 2022 due to progression of disease.  Zytiga 1000 mg daily with prednisone 5 mg daily started in March 2022.   Current therapy:    Eligard 30 mg started in April 2021.  He will receive Eligard today and repeated in 4 months.  Taxotere chemotherapy 75 mg per metered square started on June 08, 2020.  He is here for cycle 5 of therapy.   Interim History: Stephen Lynch returns today for repeat evaluation.  Since the last visit, he continues to tolerate chemotherapy reasonably well without any complaints.  He denies any nausea, vomiting or abdominal pain.  His appetite remained excellent and has gained more weight.  He denies any recent hospitalization or illnesses.  He denies any bone pain or pathological fractures.  His quality of life remained improved.   Medications: Reviewed and updated today. Current Outpatient Medications  Medication Sig Dispense Refill   acetaminophen (TYLENOL) 500 MG tablet Take 500 mg by mouth every 6 (six) hours as needed for mild pain.     ASPIRIN 81 PO Take 81 mg by mouth daily.      Calcium-Magnesium-Zinc 333-133-5 MG TABS Take 1 tablet by mouth daily.     Chromium 1 MG CAPS Take by mouth.     Continuous Blood Gluc Sensor (FREESTYLE LIBRE 2 SENSOR) MISC APPLY EVERY 14 DAYS, CHECK GLUCOSE BEFORE EACH MEAL, 2 HRS AFTER, IN THE MORNING AND BEFORE BEDTIME     DULoxetine (CYMBALTA) 60 MG capsule Take 60 mg by mouth daily.     insulin lispro (HUMALOG) 100  UNIT/ML KwikPen Junior Inject 0.2 mLs (20 Units total) into the skin 3 (three) times daily after meals. (Patient taking differently: Inject 7-25 Units into the skin 3 (three) times daily after meals. Per sliding scale) 3 mL 6   lidocaine (LIDODERM) 5 % Place 2 patches onto the skin daily. Remove & Discard patch within 12 hours or as directed by MD 30 patch 0   lidocaine-prilocaine (EMLA) cream Apply 1 application topically as needed. 30 g 0   loperamide (IMODIUM) 1 MG/5ML solution Take by mouth as needed for diarrhea or loose stools.     magnesium 30 MG tablet Take 30 mg by mouth 2 (two) times daily.     Menthol, Topical Analgesic, (BIOFREEZE EX) Apply topically.     Menthol-Camphor (TIGER BALM ARTHRITIS RUB EX) Apply 1 application topically 2 (two) times daily as needed (back pain).     ondansetron (ZOFRAN) 4 MG tablet TAKE 1 TABLET BY MOUTH EVERY 6 HOURS AS NEEDED FOR NAUSEA 20 tablet 0   oxyCODONE-acetaminophen (PERCOCET) 7.5-325 MG tablet Take 1 tablet by mouth 4 (four) times daily as needed. 60 tablet 0   prochlorperazine (COMPAZINE) 10 MG tablet TAKE 1 TABLET BY MOUTH EVERY 6 HOURS AS NEEDED FOR NAUSEA OR VOMITING. 30 tablet 0   rosuvastatin (CRESTOR) 10 MG tablet Take 1 tablet (10 mg total) by mouth daily. 90 tablet 3   TRESIBA FLEXTOUCH 100 UNIT/ML SOPN FlexTouch Pen Inject 10  Units into the skin at bedtime.      dapagliflozin propanediol (FARXIGA) 5 MG TABS tablet Take 1 tablet (5 mg total) by mouth daily. (Patient not taking: No sig reported) 30 tablet 3   doxycycline (VIBRA-TABS) 100 MG tablet  (Patient not taking: No sig reported)     No current facility-administered medications for this visit.     Allergies: No Known Allergies    Physical Exam:     Blood pressure (!) 148/68, pulse 70, temperature (!) 97.4 F (36.3 C), temperature source Oral, resp. rate 18, height 6\' 1"  (1.854 m), weight 236 lb 9.6 oz (107.3 kg), SpO2 100 %.    ECOG: 1    General appearance:  Comfortable appearing without any discomfort Head: Normocephalic without any trauma Oropharynx: Mucous membranes are moist and pink without any thrush or ulcers. Eyes: Pupils are equal and round reactive to light. Lymph nodes: No cervical, supraclavicular, inguinal or axillary lymphadenopathy.   Heart:regular rate and rhythm.  S1 and S2 without leg edema. Lung: Clear without any rhonchi or wheezes.  No dullness to percussion. Abdomin: Soft, nontender, nondistended with good bowel sounds.  No hepatosplenomegaly. Musculoskeletal: No joint deformity or effusion.  Full range of motion noted. Neurological: No deficits noted on motor, sensory and deep tendon reflex exam. Skin: No petechial rash or dryness.  Appeared moist.                 Lab Results: Lab Results  Component Value Date   WBC 5.1 08/31/2020   HGB 8.5 (L) 08/31/2020   HCT 26.6 (L) 08/31/2020   MCV 98.2 08/31/2020   PLT 152 08/31/2020     Chemistry      Component Value Date/Time   NA 140 08/18/2020 0833   K 4.1 08/18/2020 0833   CL 105 08/18/2020 0833   CO2 27 08/18/2020 0833   BUN 15 08/18/2020 0833   CREATININE 0.84 08/18/2020 0833   CREATININE 0.85 08/10/2020 1255      Component Value Date/Time   CALCIUM 9.0 08/18/2020 0833   ALKPHOS 337 (H) 08/10/2020 1255   AST 13 (L) 08/10/2020 1255   ALT 6 08/10/2020 1255   BILITOT 0.3 08/10/2020 1255         Results for Stephen, Lynch (MRN [id number]) as of 08/31/2020 11:34  Ref. Range 07/20/2020 11:10 08/10/2020 12:55  Prostate Specific Ag, Serum Latest Ref Range: 0.0 - 4.0 ng/mL 56.8 (H) 52.1 (H)      Impression and Plan:   75 year old man with:  1.   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   He is currently on Taxotere with excellent clinical and PSA response.  Risks and benefits of continuing this treatment were discussed at this time.  Complications that include nausea, vomiting, neuropathy  among others were reviewed.  At this time he is agreeable to continue given his excellent PSA response that is currently at 52.  Alternative treatment options including lutetium 117 would be a reasonable option.   2.  Androgen deprivation: He is currently on Eligard every 4 months.  Complication occluding weight gain and hot flashes were reviewed.  He will receive injection today.   3.  Bone directed therapy: He will continue calcium and vitamin D supplements with consideration for Xgeva after dental clearance.   4.  Prognosis and goals of care: His disease is incurable although aggressive measures are warranted given his excellent performance status time.   5.  Anemia: Related to  malignancy and chemotherapy.  Hemoglobin is adequate without any need for growth factor support.  6.  Bone pain: Related to his prostate cancer with pain is manageable.  We will refill his pain medication at this time.  7.  IV access: Port-A-Cath remains in use without any issues.  8.  Thyroid goiter: He is currently followed by endocrinology potential biopsy scheduled in the future.   9.  Follow-up: He will return in 3 weeks for repeat evaluation.    30  minutes were dedicated to this visit.  Time was spent on reviewing laboratory data, disease status update, treatment choices and future plan of care discussion.   Zola Button, MD 7/21/202211:45 AM

## 2020-09-01 ENCOUNTER — Telehealth: Payer: Self-pay | Admitting: Endocrinology

## 2020-09-01 ENCOUNTER — Other Ambulatory Visit: Payer: Self-pay | Admitting: *Deleted

## 2020-09-01 ENCOUNTER — Telehealth: Payer: Self-pay

## 2020-09-01 DIAGNOSIS — Z794 Long term (current) use of insulin: Secondary | ICD-10-CM

## 2020-09-01 DIAGNOSIS — E1165 Type 2 diabetes mellitus with hyperglycemia: Secondary | ICD-10-CM

## 2020-09-01 LAB — PROSTATE-SPECIFIC AG, SERUM (LABCORP): Prostate Specific Ag, Serum: 47.6 ng/mL — ABNORMAL HIGH (ref 0.0–4.0)

## 2020-09-01 NOTE — Telephone Encounter (Signed)
[facility] called to request a refill for pt. They state we need to send an updated prescription for the pt with the increased dosage of 38 units.   MEDICATION: tresiba  PHARMACY:   [id number] - [address] AT [address]Phone:  [phone] Fax:  [phone]     HAS THE PATIENT CONTACTED THEIR PHARMACY?  no  IS THIS A 90 DAY SUPPLY : she doesn't know  IS PATIENT OUT OF MEDICATION: yes  IF NOT; HOW MUCH IS LEFT:   LAST APPOINTMENT DATE: '@7'$ /13/2022  NEXT APPOINTMENT DATE:'@8'$ /19/2022  DO WE HAVE YOUR PERMISSION TO LEAVE A DETAILED MESSAGE?:  **Let patient know to contact pharmacy at the end of the day to make sure medication is ready. **  ** Please notify patient to allow 48-72 hours to process**  **Encourage patient to contact the pharmacy for refills or they can [facility]

## 2020-09-01 NOTE — Patient Outreach (Addendum)
[facility]) Care Management  09/01/2020  Stephen Lynch 02-03-1946 [id number]   Outgoing call placed to member (both his and his fiance number), unsuccessful, HIPAA compliant messages left on both lines.  Will follow up within the next 3-4 business days.     Update:  Incoming call received back from member.  State he is doing well, confirms completed appointment with endocrinologist.  Also remains active with oncologist, Reserve has decreased with ongoing infusions.  Denies any urgent concerns, encouraged to contact this care manager with questions.  Agrees to follow up within the next week to confirm [place] refill has been received.   Goals Addressed             This Visit's Progress    [facility] - Monitor and Manage My Blood Sugar   On track      Timeframe:  Short-Term Goal Priority:  High Start Date:   6/23                    Expected End Date:  7/23 (reset)  Barriers: Health Behaviors                 - check blood sugar at prescribed times - check blood sugar before and after exercise - take the blood sugar log to all doctor visits - take the blood sugar meter to all doctor visits    Why is this important?   Checking your blood sugar at home helps to keep it from getting very high or very low.  Writing the results in a diary or log helps the doctor know how to care for you.  Your blood sugar log should have the time, date and the results.  Also, write down the amount of insulin or other medicine that you take.  Other information, like what you ate, exercise done and how you were feeling, will also be helpful.     Notes:   12/9 - Reminded of diabetic diet and encouraged to continue medication adherence  2/15 - Reviewed medication changes and importance of checking blood sugars to decrease risk of hypoglycemic episodes  4/21 - Girlfriend aware of importance or managing blood sugars in addition to cancer treatments  5/25 - Girlfriend report blood  sugars have remained less than 200 but not at 130 with fasting as recommended.  He is on his last day of 28 units, will increase to 30 units tomorrow per order if glucose remains greater then 130  6/23 - Per girlfriend, member continues to have difficulty managing blood sugars.  Has been as low as 59 and as high as 300's.  Will send more DM education, they will continue to monitor  7/22 - Member made aware to increase Tresiba dose by 2 units every 3 days until fasting glucose is 130 or less.  Blood sugar today was 200, last does increase was on 7/16.  He will continue titration once Stephen Lynch is refilled     [facility] - Set My Target A1C   On track      Timeframe:  Long-Range Goal Priority:  Medium Start Date:       6/23                    Expected End Date:  9/23  Barriers: Health Behaviors Knowledge                      - set target A1C less than 8  Why is this important?   Your target A1C is decided together by you and your doctor.  It is based on several things like your age and other health issues.    Notes:   12/9 - Encouraged again to contact new endocrinologist to schedule appointment  2/15 - Call place to PCP office to request A1C  4/21 - Appointment scheduled with Stephen Lynch for 5/16  5/25 - Reminded member/girlfriend of proper diabetic management.  Encouraged to stop late night/over night snacking.    6/23 - Stephen Lynch was recommended, but unable to afford.  Referral placed to pharmacy team for suggestions  7/22 - Was seen by Stephen Lynch on 7/14.  Member still having some issues with adhering to insulin regime.  Reviewed instructions again, now taking 57 unites of [place].  However, state he has run out.  [facility], notified that new order would need to be placed as they still have dose as 10 units and insurance will not pay for it until next week.  Call placed to Stephen Lynch office, request made to have prescription called in for updated dose.        [name],  [place], MSN [place] [phone]

## 2020-09-01 NOTE — Telephone Encounter (Signed)
I called and spoke with the patient's wife, [name], (per DPR) to inform her of the patient's most recent PSA lab results. [name] verbalized understanding and will communicate this to the patient.

## 2020-09-01 NOTE — Telephone Encounter (Signed)
-----   Message from [name], MD sent at 09/01/2020  8:14 AM EDT ----- Please let him know his PSA is down

## 2020-09-02 ENCOUNTER — Inpatient Hospital Stay: Payer: Medicare Other

## 2020-09-02 ENCOUNTER — Other Ambulatory Visit: Payer: Self-pay

## 2020-09-02 VITALS — BP 143/47 | HR 83 | Temp 97.7°F

## 2020-09-02 DIAGNOSIS — Z191 Hormone sensitive malignancy status: Secondary | ICD-10-CM | POA: Diagnosis not present

## 2020-09-02 DIAGNOSIS — C7951 Secondary malignant neoplasm of bone: Secondary | ICD-10-CM | POA: Diagnosis not present

## 2020-09-02 DIAGNOSIS — Z79899 Other long term (current) drug therapy: Secondary | ICD-10-CM | POA: Diagnosis not present

## 2020-09-02 DIAGNOSIS — Z5111 Encounter for antineoplastic chemotherapy: Secondary | ICD-10-CM | POA: Diagnosis not present

## 2020-09-02 DIAGNOSIS — C61 Malignant neoplasm of prostate: Secondary | ICD-10-CM

## 2020-09-02 MED ORDER — PEGFILGRASTIM-JMDB 6 MG/0.6ML ~~LOC~~ SOSY
6.0000 mg | PREFILLED_SYRINGE | Freq: Once | SUBCUTANEOUS | Status: AC
  Administered 2020-09-02: 6 mg via SUBCUTANEOUS

## 2020-09-02 NOTE — Patient Instructions (Signed)

## 2020-09-04 ENCOUNTER — Other Ambulatory Visit: Payer: Self-pay | Admitting: *Deleted

## 2020-09-04 MED ORDER — TRESIBA FLEXTOUCH 100 UNIT/ML ~~LOC~~ SOPN
36.0000 [IU] | PEN_INJECTOR | Freq: Every day | SUBCUTANEOUS | 3 refills | Status: DC
Start: 2020-09-04 — End: 2021-03-14

## 2020-09-04 NOTE — Telephone Encounter (Signed)
Rx sent to preferred pharmacy.

## 2020-09-04 NOTE — Patient Outreach (Signed)
[name]) Care Management  09/04/2020  Stephen Lynch 11-28-1945 [id number]   [facility] Tresiba.  Notified that new prescription was not received from MD office, but they are able to fill today using the old dosage (10 units daily).  Call placed to Dr. Ronnie Derby office, request made again to send new prescription to pharmacy as member is currently taking 38 units per MD titration orders.  Call placed to member to advise medication being refilled today, no answer.  Voice message left, will follow up within the next 3-4 business days.  [name], [place], MSN [place] [phone]

## 2020-09-05 ENCOUNTER — Other Ambulatory Visit: Payer: Self-pay | Admitting: *Deleted

## 2020-09-05 NOTE — Patient Outreach (Signed)
[facility]) Care Management  09/05/2020  Wyett Forcucci 10-25-1945 [id number]   Incoming call received from member.  Update provided on Tresiba, advised that it should be ready for pick up at pharmacy.  Encouraged to contact this care manager if unable to obtain.  Will follow up within the next 2 weeks.  [name], [place], MSN [place] [phone]

## 2020-09-06 NOTE — Patient Instructions (Signed)
Take tresiba dose every night as written on sheet Increase the dose by 2units every 3 days until the morning blood sugar is less than 150.   Once the morning blood sugar is below 150, remain on that Tresiba dose  Take Humalog 10-15 minutes before all meals- 10-15 units. Call if questions.

## 2020-09-18 DIAGNOSIS — C61 Malignant neoplasm of prostate: Secondary | ICD-10-CM | POA: Diagnosis not present

## 2020-09-18 DIAGNOSIS — E782 Mixed hyperlipidemia: Secondary | ICD-10-CM | POA: Diagnosis not present

## 2020-09-18 DIAGNOSIS — E1169 Type 2 diabetes mellitus with other specified complication: Secondary | ICD-10-CM | POA: Diagnosis not present

## 2020-09-18 DIAGNOSIS — N390 Urinary tract infection, site not specified: Secondary | ICD-10-CM | POA: Diagnosis not present

## 2020-09-18 DIAGNOSIS — E119 Type 2 diabetes mellitus without complications: Secondary | ICD-10-CM | POA: Diagnosis not present

## 2020-09-18 DIAGNOSIS — I10 Essential (primary) hypertension: Secondary | ICD-10-CM | POA: Diagnosis not present

## 2020-09-18 DIAGNOSIS — E785 Hyperlipidemia, unspecified: Secondary | ICD-10-CM | POA: Diagnosis not present

## 2020-09-20 ENCOUNTER — Other Ambulatory Visit: Payer: Self-pay | Admitting: *Deleted

## 2020-09-20 NOTE — Patient Outreach (Signed)
[facility]) Care Management  09/20/2020  Stephen Lynch 18-Jun-1945 [id number]   Outgoing call placed to member, no answer, HIPAA compliant voice message left.  Call placed to girlfriend, state member seems to be doing well, unable to answer questions regarding DM management.  Will have member call this care manager back.  [facility], confirmed that [place] prescription has been updated.  Will await call back from member, if no call back will follow up within the next 3-5 business days.  [name], [place], MSN [place] [phone]

## 2020-09-21 ENCOUNTER — Inpatient Hospital Stay: Payer: Medicare Other

## 2020-09-21 ENCOUNTER — Inpatient Hospital Stay: Payer: Medicare Other | Attending: Oncology

## 2020-09-21 ENCOUNTER — Inpatient Hospital Stay (HOSPITAL_BASED_OUTPATIENT_CLINIC_OR_DEPARTMENT_OTHER): Payer: Medicare Other | Admitting: Oncology

## 2020-09-21 ENCOUNTER — Other Ambulatory Visit: Payer: Self-pay

## 2020-09-21 VITALS — BP 128/59 | HR 79 | Temp 97.8°F | Resp 17 | Ht 73.0 in | Wt 242.6 lb

## 2020-09-21 DIAGNOSIS — Z192 Hormone resistant malignancy status: Secondary | ICD-10-CM | POA: Insufficient documentation

## 2020-09-21 DIAGNOSIS — C7951 Secondary malignant neoplasm of bone: Secondary | ICD-10-CM | POA: Insufficient documentation

## 2020-09-21 DIAGNOSIS — D630 Anemia in neoplastic disease: Secondary | ICD-10-CM | POA: Diagnosis not present

## 2020-09-21 DIAGNOSIS — T451X5A Adverse effect of antineoplastic and immunosuppressive drugs, initial encounter: Secondary | ICD-10-CM | POA: Insufficient documentation

## 2020-09-21 DIAGNOSIS — Z5189 Encounter for other specified aftercare: Secondary | ICD-10-CM | POA: Insufficient documentation

## 2020-09-21 DIAGNOSIS — Z5111 Encounter for antineoplastic chemotherapy: Secondary | ICD-10-CM | POA: Diagnosis not present

## 2020-09-21 DIAGNOSIS — C61 Malignant neoplasm of prostate: Secondary | ICD-10-CM

## 2020-09-21 DIAGNOSIS — Z79818 Long term (current) use of other agents affecting estrogen receptors and estrogen levels: Secondary | ICD-10-CM | POA: Diagnosis not present

## 2020-09-21 DIAGNOSIS — D6481 Anemia due to antineoplastic chemotherapy: Secondary | ICD-10-CM | POA: Diagnosis not present

## 2020-09-21 DIAGNOSIS — Z95828 Presence of other vascular implants and grafts: Secondary | ICD-10-CM

## 2020-09-21 DIAGNOSIS — E049 Nontoxic goiter, unspecified: Secondary | ICD-10-CM | POA: Diagnosis not present

## 2020-09-21 DIAGNOSIS — Z9221 Personal history of antineoplastic chemotherapy: Secondary | ICD-10-CM | POA: Insufficient documentation

## 2020-09-21 LAB — CMP (CANCER CENTER ONLY)
ALT: 12 U/L (ref 0–44)
AST: 23 U/L (ref 15–41)
Albumin: 3.6 g/dL (ref 3.5–5.0)
Alkaline Phosphatase: 295 U/L — ABNORMAL HIGH (ref 38–126)
Anion gap: 8 (ref 5–15)
BUN: 20 mg/dL (ref 8–23)
CO2: 23 mmol/L (ref 22–32)
Calcium: 8.9 mg/dL (ref 8.9–10.3)
Chloride: 108 mmol/L (ref 98–111)
Creatinine: 0.88 mg/dL (ref 0.61–1.24)
GFR, Estimated: >60 mL/min (ref >60)
Glucose, Bld: 220 mg/dL — ABNORMAL HIGH (ref 70–99)
Potassium: 4.2 mmol/L (ref 3.5–5.1)
Sodium: 139 mmol/L (ref 135–145)
Total Bilirubin: 0.3 mg/dL (ref 0.3–1.2)
Total Protein: 6.6 g/dL (ref 6.5–8.1)

## 2020-09-21 LAB — CBC WITH DIFFERENTIAL (CANCER CENTER ONLY)
Abs Immature Granulocytes: 0.04 10*3/uL (ref 0.00–0.07)
Basophils Absolute: 0 10*3/uL (ref 0.0–0.1)
Basophils Relative: 1 %
Eosinophils Absolute: 0 10*3/uL (ref 0.0–0.5)
Eosinophils Relative: 0 %
HCT: 27.7 % — ABNORMAL LOW (ref 39.0–52.0)
Hemoglobin: 8.7 g/dL — ABNORMAL LOW (ref 13.0–17.0)
Immature Granulocytes: 1 %
Lymphocytes Relative: 14 %
Lymphs Abs: 0.9 10*3/uL (ref 0.7–4.0)
MCH: 31.3 pg (ref 26.0–34.0)
MCHC: 31.4 g/dL (ref 30.0–36.0)
MCV: 99.6 fL (ref 80.0–100.0)
Monocytes Absolute: 0.6 10*3/uL (ref 0.1–1.0)
Monocytes Relative: 10 %
Neutro Abs: 4.9 10*3/uL (ref 1.7–7.7)
Neutrophils Relative %: 74 %
Platelet Count: 157 10*3/uL (ref 150–400)
RBC: 2.78 MIL/uL — ABNORMAL LOW (ref 4.22–5.81)
RDW: 18.7 % — ABNORMAL HIGH (ref 11.5–15.5)
WBC Count: 6.5 10*3/uL (ref 4.0–10.5)
nRBC: 0.5 % — ABNORMAL HIGH (ref 0.0–0.2)

## 2020-09-21 MED ORDER — SODIUM CHLORIDE 0.9% FLUSH
10.0000 mL | INTRAVENOUS | Status: DC | PRN
  Administered 2020-09-21: 10 mL
  Filled 2020-09-21: qty 10

## 2020-09-21 MED ORDER — SODIUM CHLORIDE 0.9 % IV SOLN
10.0000 mg | Freq: Once | INTRAVENOUS | Status: AC
  Administered 2020-09-21: 10 mg via INTRAVENOUS
  Filled 2020-09-21: qty 10

## 2020-09-21 MED ORDER — SODIUM CHLORIDE 0.9 % IV SOLN
Freq: Once | INTRAVENOUS | Status: AC
  Filled 2020-09-21: qty 250

## 2020-09-21 MED ORDER — LEUPROLIDE ACETATE (4 MONTH) 30 MG ~~LOC~~ KIT
30.0000 mg | PACK | Freq: Once | SUBCUTANEOUS | Status: AC
  Administered 2020-09-21: 30 mg via SUBCUTANEOUS
  Filled 2020-09-21: qty 30

## 2020-09-21 MED ORDER — SODIUM CHLORIDE 0.9 % IV SOLN
75.0000 mg/m2 | Freq: Once | INTRAVENOUS | Status: AC
  Administered 2020-09-21: 170 mg via INTRAVENOUS
  Filled 2020-09-21: qty 17

## 2020-09-21 MED ORDER — HEPARIN SOD (PORK) LOCK FLUSH 100 UNIT/ML IV SOLN
500.0000 [IU] | Freq: Once | INTRAVENOUS | Status: AC | PRN
  Administered 2020-09-21: 500 [IU]
  Filled 2020-09-21: qty 5

## 2020-09-21 MED ORDER — SODIUM CHLORIDE 0.9% FLUSH
10.0000 mL | INTRAVENOUS | Status: AC | PRN
  Administered 2020-09-21: 10 mL
  Filled 2020-09-21: qty 10

## 2020-09-21 NOTE — Progress Notes (Signed)
Hematology and Oncology Follow Up Visit  Stephen Lynch [id number] 05/04/1945 75 y.o. 09/21/2020 9:40 AM Stephen Lei, [name], Stephen Rude, MD   Principle Diagnosis: 67 year old man with castration-resistant advanced prostate cancer with disease to the bone diagnosed in February 2021.  He presented with advanced disease and PSA 193 at the time of diagnosis.   Prior Therapy:  He status post CT-guided biopsy of the right iliac bone.  [name] started on April 20, 2019.  Xtandi 160 mg daily started in April 2021.  Therapy discontinued in March 2022 due to progression of disease.  Zytiga 1000 mg daily with prednisone 5 mg daily started in March 2022.   Current therapy:    Eligard 30 mg started in April 2021.  He will receive Eligard today and repeated in 4 months.  Taxotere chemotherapy 75 mg per metered square started on June 08, 2020.  He is here for cycle 6 of therapy.   Interim History: Stephen Lynch is here for a follow-up visit.  Since the last visit, he reports feeling well without any major complaints.  He has tolerated chemotherapy without any new issues.  He denies any nausea, vomiting or worsening neuropathy.  He does report some nail changes that has been overall minor and manageable.  His performance status quality of life remained improved.  He is eating better and his pain is much improved.  His mobility continues to improve.   Medications: Updated on review. Current Outpatient Medications  Medication Sig Dispense Refill   acetaminophen (TYLENOL) 500 MG tablet Take 500 mg by mouth every 6 (six) hours as needed for mild pain.     ASPIRIN 81 PO Take 81 mg by mouth daily.      Calcium-Magnesium-Zinc 333-133-5 MG TABS Take 1 tablet by mouth daily.     Chromium 1 MG CAPS Take by mouth.     Continuous Blood Gluc Sensor (FREESTYLE LIBRE 2 SENSOR) MISC APPLY EVERY 14 DAYS, CHECK GLUCOSE BEFORE EACH MEAL, 2 HRS AFTER, IN THE MORNING AND BEFORE BEDTIME     dapagliflozin propanediol  (FARXIGA) 5 MG TABS tablet Take 1 tablet (5 mg total) by mouth daily. (Patient not taking: No sig reported) 30 tablet 3   doxycycline (VIBRA-TABS) 100 MG tablet  (Patient not taking: No sig reported)     DULoxetine (CYMBALTA) 60 MG capsule Take 60 mg by mouth daily.     insulin lispro (HUMALOG) 100 UNIT/ML KwikPen Junior Inject 0.2 mLs (20 Units total) into the skin 3 (three) times daily after meals. (Patient taking differently: Inject 7-25 Units into the skin 3 (three) times daily after meals. Per sliding scale) 3 mL 6   lidocaine (LIDODERM) 5 % Place 2 patches onto the skin daily. Remove & Discard patch within 12 hours or as directed by MD 30 patch 0   lidocaine-prilocaine (EMLA) cream Apply 1 application topically as needed. 30 g 0   loperamide (IMODIUM) 1 MG/5ML solution Take by mouth as needed for diarrhea or loose stools.     magnesium 30 MG tablet Take 30 mg by mouth 2 (two) times daily.     Menthol, Topical Analgesic, (BIOFREEZE EX) Apply topically.     Menthol-Camphor (TIGER BALM ARTHRITIS RUB EX) Apply 1 application topically 2 (two) times daily as needed (back pain).     ondansetron (ZOFRAN) 4 MG tablet Take 1 tablet (4 mg total) by mouth every 6 (six) hours as needed. for nausea 20 tablet 1   oxyCODONE-acetaminophen (PERCOCET) 7.5-325 MG tablet Take 1 tablet by  mouth 4 (four) times daily as needed. 60 tablet 0   prochlorperazine (COMPAZINE) 10 MG tablet TAKE 1 TABLET BY MOUTH EVERY 6 HOURS AS NEEDED FOR NAUSEA OR VOMITING. 30 tablet 0   rosuvastatin (CRESTOR) 10 MG tablet Take 1 tablet (10 mg total) by mouth daily. 90 tablet 3   TRESIBA FLEXTOUCH 100 UNIT/ML FlexTouch Pen Inject 36 Units into the skin at bedtime. 15 mL 3   No current facility-administered medications for this visit.     Allergies: No Known Allergies    Physical Exam:     Blood pressure (!) 128/59, pulse 79, temperature 97.8 F (36.6 C), temperature source Oral, resp. rate 17, height '6\' 1"'$  (1.854 m), weight  242 lb 9.6 oz (110 kg), SpO2 98 %.     ECOG: 1     General appearance: Alert, awake without any distress. Head: Atraumatic without abnormalities Oropharynx: Without any thrush or ulcers. Eyes: No scleral icterus. Lymph nodes: No lymphadenopathy noted in the cervical, supraclavicular, or axillary nodes Heart:regular rate and rhythm, without any murmurs or gallops.   Lung: Clear to auscultation without any rhonchi, wheezes or dullness to percussion. Abdomin: Soft, nontender without any shifting dullness or ascites. Musculoskeletal: No clubbing or cyanosis. Neurological: No motor or sensory deficits. Skin: No rashes or lesions.                Lab Results: Lab Results  Component Value Date   WBC 5.1 08/31/2020   HGB 8.5 (L) 08/31/2020   HCT 26.6 (L) 08/31/2020   MCV 98.2 08/31/2020   PLT 152 08/31/2020     Chemistry      Component Value Date/Time   NA 139 08/31/2020 1102   K 4.5 08/31/2020 1102   CL 107 08/31/2020 1102   CO2 26 08/31/2020 1102   BUN 19 08/31/2020 1102   CREATININE 0.81 08/31/2020 1102      Component Value Date/Time   CALCIUM 8.9 08/31/2020 1102   ALKPHOS 234 (H) 08/31/2020 1102   AST 18 08/31/2020 1102   ALT 13 08/31/2020 1102   BILITOT 0.6 08/31/2020 1102          Results for Stephen Lynch, Stephen Lynch (MRN [id number]) as of 09/21/2020 09:42  Ref. Range 07/20/2020 11:10 08/10/2020 12:55 08/31/2020 11:02  Prostate Specific Ag, Serum Latest Ref Range: 0.0 - 4.0 ng/mL 56.8 (H) 52.1 (H) 47.6 (H)     Impression and Plan:   75 year old man with:  1.   Advanced prostate cancer with disease to the bone diagnosed in February 2022.  He developed castration-resistant disease.   The natural course of his disease was reviewed at this time and treatment choices were discussed.  His PSA continues to drop in response to therapy with reasonable tolerance.  Risks and benefits of continuing this treatment at this time were discussed.  Complications that  include nausea, vomiting, neuropathy and myelosuppression and reviewed.  Given his excellent clinical benefit and a PSA response I recommended continuing treatment for the time being.  He is agreeable at this time.   2.  Androgen deprivation: It would receive Eligard today and repeated in 4 months.  Complication clinic weight gain, hot flashes among others were reviewed.    3.  Bone directed therapy: I recommended calcium and vitamin D supplements with Xgeva deferred when he obtains dental clearance.   4.  Prognosis and goals of care: Therapy remains palliative although aggressive measures are warranted at this time.   5.  Anemia: He has been asymptomatic and  has not required any intervention.  His anemia is related to malignancy and chemotherapy.  Hemoglobin is improving without any transfusion need.  He is asymptomatic.  6.  Bone pain: His pain is manageable and related to prostate cancer to the bone.  Pain medication regimen appears to be controlling his pain.  7.  IV access: Port-A-Cath currently in use without any issues.  8.  Thyroid goiter: Follow with endocrinology regarding this issue.  It has no impact on his cancer treatment at this time.  9.  Follow-up: In 3 months for the next cycle of therapy.    30  minutes were spent on this encounter.  Time was dedicated to reviewing laboratory data, disease status update, addressing complications noted to cancer and cancer therapy.   Zola Button, MD 8/11/20229:40 AM

## 2020-09-21 NOTE — Patient Instructions (Signed)
[facility]ICAL [facility]COL[facility]   ?Discharge Instructions: ?Thank you for choosing [facility] to provide your oncology and hematology care.  ? ?If you have a lab appointment with the Cancer Center, please go directly to the Cancer Center and check in at the registration area. ?  ?Wear comfortable clothing and clothing appropriate for easy access to any Portacath or PICC line.  ? ?We strive to give you quality time with your provider. You may need to reschedule your appointment if you arrive late (15 or more minutes).  Arriving late affects you and other patients whose appointments are after yours.  Also, if you miss three or more appointments without notifying the office, you may be dismissed from the clinic at the provider?s discretion.    ?  ?For prescription refill requests, have your pharmacy contact our office and allow 72 hours for refills to be completed.   ? ?Today you received the following chemotherapy and/or immunotherapy agents: Docetaxel (Taxotere)    ?  ?To help prevent nausea and vomiting after your treatment, we encourage you to take your nausea medication as directed. ? ?BELOW ARE SYMPTOMS THAT SHOULD BE REPORTED IMMEDIATELY: ?*FEVER GREATER THAN 100.4 F (38 ?C) OR HIGHER ?*CHILLS OR SWEATING ?*NAUSEA AND VOMITING THAT IS NOT CONTROLLED WITH YOUR NAUSEA MEDICATION ?*UNUSUAL SHORTNESS OF BREATH ?*UNUSUAL BRUISING OR BLEEDING ?*URINARY PROBLEMS (pain or burning when urinating, or frequent urination) ?*BOWEL PROBLEMS (unusual diarrhea, constipation, pain near the anus) ?TENDERNESS IN MOUTH AND THROAT WITH OR WITHOUT PRESENCE OF ULCERS (sore throat, sores in mouth, or a toothache) ?UNUSUAL RASH, SWELLING OR PAIN  ?UNUSUAL VAGINAL DISCHARGE OR ITCHING  ? ?Items with * indicate a potential emergency and should be followed up as soon as possible or go to the Emergency Department if any problems should occur. ? ?Please show the CHEMOTHERAPY ALERT CARD or IMMUNOTHERAPY ALERT CARD at  check-in to the Emergency Department and triage nurse. ? ?Should you have questions after your visit or need to cancel or reschedule your appointment, please contact [facility]  Dept: [phone]  and follow the prompts.  Office hours are 8:00 a.m. to 4:30 p.m. Monday - Friday. Please note that voicemails left after 4:00 p.m. may not be returned until the following business day.  We are closed weekends and major holidays. You have access to a nurse at all times for urgent questions. Please call the main number to the clinic Dept: [phone] and follow the prompts. ? ? ?For any non-urgent questions, you may also contact your provider using MyChart. We now offer e-Visits for anyone 18 and older to request care online for non-urgent symptoms. For details visit [email or web address]. ?  ?Also download the MyChart app! Go to the app store, search "MyChart", open the app, select [facility], and log in with your MyChart username and password. ? ?Due to Covid, a mask is required upon entering the hospital/clinic. If you do not have a mask, one will be given to you upon arrival. For doctor visits, patients may have 1 support person aged 18 or older with them. For treatment visits, patients cannot have anyone with them due to current Covid guidelines and our immunocompromised population.  ? ?

## 2020-09-22 LAB — PROSTATE-SPECIFIC AG, SERUM (LABCORP): Prostate Specific Ag, Serum: 52.1 ng/mL — ABNORMAL HIGH (ref 0.0–4.0)

## 2020-09-23 ENCOUNTER — Inpatient Hospital Stay: Payer: Medicare Other

## 2020-09-23 ENCOUNTER — Other Ambulatory Visit: Payer: Self-pay

## 2020-09-23 VITALS — BP 156/59 | HR 82 | Temp 98.2°F | Resp 18

## 2020-09-23 DIAGNOSIS — C61 Malignant neoplasm of prostate: Secondary | ICD-10-CM

## 2020-09-23 DIAGNOSIS — Z192 Hormone resistant malignancy status: Secondary | ICD-10-CM | POA: Diagnosis not present

## 2020-09-23 DIAGNOSIS — Z5189 Encounter for other specified aftercare: Secondary | ICD-10-CM | POA: Diagnosis not present

## 2020-09-23 DIAGNOSIS — Z79818 Long term (current) use of other agents affecting estrogen receptors and estrogen levels: Secondary | ICD-10-CM | POA: Diagnosis not present

## 2020-09-23 DIAGNOSIS — Z5111 Encounter for antineoplastic chemotherapy: Secondary | ICD-10-CM | POA: Diagnosis not present

## 2020-09-23 DIAGNOSIS — C7951 Secondary malignant neoplasm of bone: Secondary | ICD-10-CM | POA: Diagnosis not present

## 2020-09-23 MED ORDER — PEGFILGRASTIM-JMDB 6 MG/0.6ML ~~LOC~~ SOSY
6.0000 mg | PREFILLED_SYRINGE | Freq: Once | SUBCUTANEOUS | Status: AC
  Administered 2020-09-23: 6 mg via SUBCUTANEOUS

## 2020-09-23 NOTE — Patient Instructions (Signed)

## 2020-09-27 ENCOUNTER — Other Ambulatory Visit: Payer: Self-pay | Admitting: *Deleted

## 2020-09-27 NOTE — Patient Outreach (Signed)
[facility]) Care Management  09/27/2020  Macklyn Parpart 1945/07/27 [id number]   Outgoing call placed to member, state he is doing well.  Management of DM has been better, still working to improve adherence.  Denies any urgent concerns, encouraged to contact this care manager with questions.  Agrees to follow up within the next month.   Goals Addressed             This Visit's Progress    [facility] - Monitor and Manage My Blood Sugar   On track      Timeframe:  Short-Term Goal Priority:  High Start Date:   6/23                    Expected End Date:  9/23 (reset)  Barriers: Health Behaviors                 - check blood sugar at prescribed times - check blood sugar before and after exercise - take the blood sugar log to all doctor visits - take the blood sugar meter to all doctor visits    Why is this important?   Checking your blood sugar at home helps to keep it from getting very high or very low.  Writing the results in a diary or log helps the doctor know how to care for you.  Your blood sugar log should have the time, date and the results.  Also, write down the amount of insulin or other medicine that you take.  Other information, like what you ate, exercise done and how you were feeling, will also be helpful.     Notes:   12/9 - Reminded of diabetic diet and encouraged to continue medication adherence  2/15 - Reviewed medication changes and importance of checking blood sugars to decrease risk of hypoglycemic episodes  4/21 - Girlfriend aware of importance or managing blood sugars in addition to cancer treatments  5/25 - Girlfriend report blood sugars have remained less than 200 but not at 130 with fasting as recommended.  He is on his last day of 28 units, will increase to 30 units tomorrow per order if glucose remains greater then 130  6/23 - Per girlfriend, member continues to have difficulty managing blood sugars.  Has been as low as 59 and as high  as 300's.  Will send more DM education, they will continue to monitor  7/22 - Member made aware to increase Tresiba dose by 2 units every 3 days until fasting glucose is 130 or less.  Blood sugar today was 200, last does increase was on 7/16.  He will continue titration once [name] is refilled  8/17 - Report [name] was filled, prescription dose was updated at pharmacy, now taking 38 units.  State blood sugar yesterday was 90, has not checked today, state he is needing to change his sensor.  Continues Humalog by taking 10 units for small meals and 15 units for larger meals.      [facility] - Set My Target A1C   On track      Timeframe:  Long-Range Goal Priority:  Medium Start Date:       6/23                    Expected End Date:  9/23  Barriers: Health Behaviors Knowledge                      - set target A1C less  than 8    Why is this important?   Your target A1C is decided together by you and your doctor.  It is based on several things like your age and other health issues.    Notes:   12/9 - Encouraged again to contact new endocrinologist to schedule appointment  2/15 - Call place to PCP office to request A1C  4/21 - Appointment scheduled with Dr. Dwyane Dee for 5/16  5/25 - Reminded member/girlfriend of proper diabetic management.  Encouraged to stop late night/over night snacking.    6/23 - [name] was recommended, but unable to afford.  Referral placed to pharmacy team for suggestions  7/22 - Was seen by Dr. Dwyane Dee on 7/14.  Member still having some issues with adhering to insulin regime.  Reviewed instructions again, now taking 62 unites of [place].  However, state he has run out.  Call placed to [facility], notified that new order would need to be placed as they still have dose as 10 units and insurance will not pay for it until next week.  Call placed to Dr. Ronnie Derby office, request made to have prescription called in for updated dose.  8/24 - follow up with Dr. Dwyane Dee next week.   Request to have list of healthy snacks mailed as his blood sugars have been up due to snacking        [name], Therapist, sports, MSN [place] Manager [phone]

## 2020-09-29 ENCOUNTER — Other Ambulatory Visit: Payer: Self-pay

## 2020-09-29 ENCOUNTER — Other Ambulatory Visit (INDEPENDENT_AMBULATORY_CARE_PROVIDER_SITE_OTHER): Payer: Medicare Other

## 2020-09-29 DIAGNOSIS — E1165 Type 2 diabetes mellitus with hyperglycemia: Secondary | ICD-10-CM

## 2020-09-29 DIAGNOSIS — Z794 Long term (current) use of insulin: Secondary | ICD-10-CM | POA: Diagnosis not present

## 2020-09-29 LAB — BASIC METABOLIC PANEL
BUN: 16 mg/dL (ref 6–23)
CO2: 26 mEq/L (ref 19–32)
Calcium: 9.1 mg/dL (ref 8.4–10.5)
Chloride: 108 mEq/L (ref 96–112)
Creatinine, Ser: 0.86 mg/dL (ref 0.40–1.50)
GFR: 85.81 mL/min (ref >60.00)
Glucose, Bld: 93 mg/dL (ref 70–99)
Potassium: 4.3 mEq/L (ref 3.5–5.1)
Sodium: 141 mEq/L (ref 135–145)

## 2020-09-30 LAB — FRUCTOSAMINE: Fructosamine: 282 umol/L (ref 0–285)

## 2020-10-02 DIAGNOSIS — I10 Essential (primary) hypertension: Secondary | ICD-10-CM | POA: Diagnosis not present

## 2020-10-02 DIAGNOSIS — N390 Urinary tract infection, site not specified: Secondary | ICD-10-CM | POA: Diagnosis not present

## 2020-10-02 DIAGNOSIS — E1169 Type 2 diabetes mellitus with other specified complication: Secondary | ICD-10-CM | POA: Diagnosis not present

## 2020-10-04 ENCOUNTER — Encounter: Payer: Self-pay | Admitting: Endocrinology

## 2020-10-04 ENCOUNTER — Other Ambulatory Visit: Payer: Self-pay

## 2020-10-04 ENCOUNTER — Encounter: Payer: Medicare Other | Attending: Pain Medicine | Admitting: Nutrition

## 2020-10-04 ENCOUNTER — Ambulatory Visit (INDEPENDENT_AMBULATORY_CARE_PROVIDER_SITE_OTHER): Payer: Medicare Other | Admitting: Endocrinology

## 2020-10-04 VITALS — BP 132/68 | HR 78 | Ht 72.0 in | Wt 240.0 lb

## 2020-10-04 DIAGNOSIS — E119 Type 2 diabetes mellitus without complications: Secondary | ICD-10-CM | POA: Insufficient documentation

## 2020-10-04 DIAGNOSIS — E1165 Type 2 diabetes mellitus with hyperglycemia: Secondary | ICD-10-CM | POA: Diagnosis not present

## 2020-10-04 DIAGNOSIS — Z794 Long term (current) use of insulin: Secondary | ICD-10-CM

## 2020-10-04 DIAGNOSIS — I10 Essential (primary) hypertension: Secondary | ICD-10-CM | POA: Diagnosis not present

## 2020-10-04 NOTE — Progress Notes (Signed)
Patient is here because his blood sugars are elevated and variable.  He reports that he is taking his Tresiba qHS and is adjusting the dose upward q 3 days per written instructions.  He is now at 38u.   He also reports taking his Humalog before meals now and is not missingforgetting these doses.  Says takes 10u before bfast, and when he eats a bigger breakfast he will take 15u.  He is eating small bagel, with sausage, and grits and cheese.  Says takes 15u for this.  He also admits to mid morning snacks of fruit-sometimes 2 servings.  Lunch is sandwich with 2 servings of fruit, 2-3 veg. Servings.  He drinks unsweet tea for bfast and lunch Mid afternoon, will have 2 cookies with almond milk and taking no extra insulin for this.   Supper is meat, 2 veg. And 2 starches.  Will have HS snack of approx. 200-300 calories with no extra insulin.   Recommendations: Limit fruit to 1 serving at a time.  If blood sugar is high at bedtime, will take 5u before HS snack.   Per Dr. Ronnie Derby order, he was instructed on how to use the Ozempic pen--dialing 0.25 once a week.  After 4 weeks, he will go up to 0.5 marking.  He turned the pen dial to 0.25 as directed with no problems.  Written instructions given for the above directions.  Explained how this medication works to make him feel less hungry, and control his blood sugars better. Discussed side effects of nausea and what to do for this.  He had no final questions.

## 2020-10-04 NOTE — Patient Instructions (Addendum)
Take Ozempic once a week.  Start at 0.25 mg.for 4 weeks, and then increase to 0.5. Return in 6 weeks to see DR. Kumar Call if questions.

## 2020-10-04 NOTE — Progress Notes (Signed)
Patient ID: Stephen Lynch, male   DOB: 08/16/1945, 75 y.o.   MRN: [id number]           Reason for Appointment: Endocrinology follow-up   PCP: Dr. Criss Rosales   History of Present Illness:          Date of diagnosis of type 2 diabetes mellitus: 2007?       Background history:   He has been on insulin since about the time of diagnosis.  He thinks initially oral agents that were prescribed did not work No detailed history of his previous treatment is available and A1c from last year was over 12%  Recent history:   Most recent A1c is 8.5  Previously 10% done on 06/26/2020 Fructosamine is 282 vs 350  INSULIN regimen is: [place] 36 U at bedtime, Humalog 10-15 units before meals  Non-insulin hypoglycemic drugs the patient is taking are: none  Current management, blood sugar patterns and problems identified:  He has been taking his Humalog more consistently at mealtimes although his blood sugars are not consistently controlled at mealtimes  It appears you are having the highest blood sugars after his morning meals although sometimes this can occur overnight or even the in the evenings With this his average blood sugar overall is improved at 180 compared to 220 on the last visit He was also told to increase his [name] as directed to 36 units  He says that he has difficulty controlling his snacks especially late at night which he does not cover with Humalog  Overnight blood sugars are quite variable but no consistent pattern and fasting readings are averaging about 160-170  He is gradually gaining weight again  Currently not using any non-insulin drugs because of cost   Typical meal intake: Breakfast is usually cereal and almond milk or grits and eggs.  Lunch fruits, tuna or other seafood dinner is rice, vegetables and protein.                Snacks are nuts, fruits, ice cream, chocolate  Exercise: minimal, limited by weakness  Glucose monitoring:  done 1-2 times a day         Glucometer:  Libre          PREVIOUS data:         Dietician visit, most recent: never  Weight history:  Wt Readings from Last 3 Encounters:  10/04/20 240 lb (108.9 kg)  09/21/20 242 lb 9.6 oz (110 kg)  08/31/20 236 lb 9.6 oz (107.3 kg)    Glycemic control:   Lab Results  Component Value Date   HGBA1C 8.5 (H) 08/18/2020   HGBA1C 10.0 (A) 06/26/2020   Lab Results  Component Value Date   LDLCALC 72 07/18/2020   CREATININE 0.86 09/29/2020   No results found for: [facility]  Lab Results  Component Value Date   FRUCTOSAMINE 282 09/29/2020   FRUCTOSAMINE 350 (H) 07/18/2020    Lab on 09/29/2020  Component Date Value Ref Range Status   Fructosamine 09/29/2020 282  0 - 285 umol/L Final   Comment: Published reference interval for apparently healthy subjects between age 23 and 74 is 63 - 285 umol/L and in a poorly controlled diabetic population is 228 - 563 umol/L with a mean of 396 umol/L.    Sodium 09/29/2020 141  135 - 145 mEq/L Final   Potassium 09/29/2020 4.3  3.5 - 5.1 mEq/L Final   Chloride 09/29/2020 108  96 - 112 mEq/L Final   CO2 09/29/2020 26  19 - 32 mEq/L Final   Glucose, Bld 09/29/2020 93  70 - 99 mg/dL Final   BUN 09/29/2020 16  6 - 23 mg/dL Final   Creatinine, Ser 09/29/2020 0.86  0.40 - 1.50 mg/dL Final   GFR 09/29/2020 85.81  >60.00 mL/min Final   Calculated using the CKD-EPI Creatinine Equation (2021)   Calcium 09/29/2020 9.1  8.4 - 10.5 mg/dL Final    Allergies as of 10/04/2020   No Known Allergies      Medication List        Accurate as of October 04, 2020  8:45 PM. If you have any questions, ask your nurse or doctor.          acetaminophen 500 MG tablet Commonly known as: TYLENOL Take 500 mg by mouth every 6 (six) hours as needed for mild pain.   ASPIRIN 81 PO Take 81 mg by mouth daily.   BIOFREEZE EX Apply topically.   Calcium-Magnesium-Zinc 333-133-5 MG Tabs Take 1 tablet by mouth daily.   Chromium 1 MG Caps Take by  mouth.   dapagliflozin propanediol 5 MG Tabs tablet Commonly known as: Farxiga Take 1 tablet (5 mg total) by mouth daily.   doxycycline 100 MG tablet Commonly known as: VIBRA-TABS   DULoxetine 60 MG capsule Commonly known as: CYMBALTA Take 60 mg by mouth daily.   FreeStyle Libre 2 Sensor Misc APPLY EVERY 14 DAYS, CHECK GLUCOSE BEFORE EACH MEAL, 2 HRS AFTER, IN THE MORNING AND BEFORE BEDTIME   HumaLOG Junior KwikPen 100 UNIT/ML KwikPen Junior Generic drug: insulin lispro Inject 0.2 mLs (20 Units total) into the skin 3 (three) times daily after meals. What changed:  how much to take additional instructions   lidocaine 5 % Commonly known as: LIDODERM Place 2 patches onto the skin daily. Remove & Discard patch within 12 hours or as directed by MD   lidocaine-prilocaine cream Commonly known as: EMLA Apply 1 application topically as needed.   loperamide 1 MG/5ML solution Commonly known as: IMODIUM Take by mouth as needed for diarrhea or loose stools.   magnesium 30 MG tablet Take 30 mg by mouth 2 (two) times daily.   ondansetron 4 MG tablet Commonly known as: ZOFRAN Take 1 tablet (4 mg total) by mouth every 6 (six) hours as needed. for nausea   oxyCODONE-acetaminophen 7.5-325 MG tablet Commonly known as: PERCOCET Take 1 tablet by mouth 4 (four) times daily as needed.   prochlorperazine 10 MG tablet Commonly known as: COMPAZINE TAKE 1 TABLET BY MOUTH EVERY 6 HOURS AS NEEDED FOR NAUSEA OR VOMITING.   rosuvastatin 10 MG tablet Commonly known as: CRESTOR Take 1 tablet (10 mg total) by mouth daily.   TIGER BALM ARTHRITIS RUB EX Apply 1 application topically 2 (two) times daily as needed (back pain).   [name] FlexTouch 100 UNIT/ML FlexTouch Pen Generic drug: insulin degludec Inject 36 Units into the skin at bedtime.        Allergies: No Known Allergies  Past Medical History:  Diagnosis Date   Cancer ([place])    prostate   Diabetes mellitus without complication  ([place])    Dyslipidemia    Hypertension     Past Surgical History:  Procedure Laterality Date   EYE SURGERY     IR IMAGING GUIDED PORT INSERTION  06/12/2020    Family History  Problem Relation Age of Onset   CAD Mother 49   Other Father 35       age   Other Brother 71  unknown cause    Social History:  reports that he has quit smoking. His smoking use included cigarettes. He has a 7.50 pack-year smoking history. He has never used smokeless tobacco. He reports that he does not currently use alcohol. He reports that he does not currently use drugs.   Review of Systems   Lipid history: Has been treated with Crestor by PCP    Lab Results  Component Value Date   CHOL 142 07/18/2020   HDL 53.80 07/18/2020   LDLCALC 72 07/18/2020   TRIG 80.0 07/18/2020   CHOLHDL 3 07/18/2020           Hypertension: Has been present previously on enalapril, managed by PCP  BP Readings from Last 3 Encounters:  10/04/20 132/68  09/23/20 (!) 156/59  09/21/20 (!) 128/59    Most recent eye exam was in 2022  Most recent foot exam: 5/22  Currently known complications of diabetes: Erectile dysfunction  MULTINODULAR goiter:  He has 3 abnormal lesions and radiologist has recommended biopsy of 2 of the solid nodules with highest TI-RADS 4 Location: Right; Inferior   Maximum size: 2.3 cm; Other 2 dimensions: 2.1 x 2.1 cm Composition: solid/almost completely solid (2)   Echogenicity: hypoechoic (2)    Biopsy has been recommended but he wants to wait till his prostate cancer treatment is underway  Lab Results  Component Value Date   TSH 0.76 07/18/2020     LABS:  Lab on 09/29/2020  Component Date Value Ref Range Status   Fructosamine 09/29/2020 282  0 - 285 umol/L Final   Comment: Published reference interval for apparently healthy subjects between age 3 and 69 is 27 - 285 umol/L and in a poorly controlled diabetic population is 228 - 563 umol/L with a mean of 396 umol/L.     Sodium 09/29/2020 141  135 - 145 mEq/L Final   Potassium 09/29/2020 4.3  3.5 - 5.1 mEq/L Final   Chloride 09/29/2020 108  96 - 112 mEq/L Final   CO2 09/29/2020 26  19 - 32 mEq/L Final   Glucose, Bld 09/29/2020 93  70 - 99 mg/dL Final   BUN 09/29/2020 16  6 - 23 mg/dL Final   Creatinine, Ser 09/29/2020 0.86  0.40 - 1.50 mg/dL Final   GFR 09/29/2020 85.81  >60.00 mL/min Final   Calculated using the CKD-EPI Creatinine Equation (2021)   Calcium 09/29/2020 9.1  8.4 - 10.5 mg/dL Final    Physical Examination:  BP 132/68   Pulse 78   Ht 6' (1.829 m)   Wt 240 lb (108.9 kg)   SpO2 97%   BMI 32.55 kg/m         ASSESSMENT:  Diabetes type 2  See history of present illness for detailed discussion of current diabetes management, blood sugar patterns and problems identified  Most recent A1c is 8.5 %   Fructosamine is relatively better at 282  Current treatment regimen is only basal bolus insulin Blood sugars are assessed from his freestyle [employer] Recently time in target is only 55% but average sugar is improving  Although he is trying to do better with his mealtime insulin doses he is likely not getting enough especially outside meals like breakfast Also may be taking some insulin doses late causing mild overcorrection Overnight blood sugars are trending better with increasing his basal rate As above his Humalog insulin and meals are not balanced and needs more education regarding estimation of how much he needs to dose  He also has difficulty controlling snacks and likely getting too many carbohydrates at various times including at night  He likely will gain more weight with intensifying his insulin doses Previously [name] was not affordable  Hypertension: Followed by PCP and blood pressure is relatively better today   PLAN:    He will see the diabetes educator today to help assess his compliance with insulin doses, better planning of his meals and snacks and  identifying reasons for hyperglycemia He will also start entering his insulin doses at the time of injection and the freestyle libre reader so this information will be identified on his next download He may need up to 20 units Humalog when he is eating larger carbohydrate meals or eating out Stressed importance of taking insulin right before eating and not wait till the sugar goes up Likely needs less correction doses if blood sugars are unusually high especially late at night For now we will keep his basal insulin the same He will benefit from adding a GLP-1 drug like Ozempic to help postprandial control, limit portions and snacks as well as provide some weight loss and long-term cardiovascular benefits Explained to the patient what GLP-1 drugs are, the sites of actions and the body, reduction of hunger sensation and improved insulin secretion.  Discussed the benefit of weight loss with these medications. Explained possible side effects of OZEMPIC, most commonly nausea that usually improves over time.  Also explained safety information associated with the medication Demonstrated the medication injection device and injection technique to the patient.  To start the injections with the 0.25 mg dosage weekly for the first 4 injections and then go up to 0.5 mg weekly if no continued nausea He was given a sample and instructed how to dial the dose on a weekly basis Will adjust insulin further based on his response   Follow-up: 6 weeks  Total visit time including counseling = 30 Minutes  Patient Instructions  Start OZEMPIC injections by dialing 0.25 mg on the pen as shown once weekly on the same day of the week.   You may inject in the sides of the stomach, outer thigh or arm as indicated in the brochure given. If you have any difficulties using the pen see the video at [email or web address]  You will feel fullness of the stomach with starting the medication and should try to keep the portions at  meals small.  You may experience nausea in the first few days which usually gets better over time    After 4 weeks increase the dose to 0.5 mg weekly  If you have any questions or persistent side effects please call the office   You may also talk to a nurse educator with [employer] at [phone] Useful website: [email or web address]         [name] 10/04/2020, 8:45 PM   Note: This office note was prepared with Dragon voice recognition system technology. Any transcriptional errors that result from this process are unintentional.

## 2020-10-04 NOTE — Patient Instructions (Signed)
Start OZEMPIC injections by dialing 0.25 mg on the pen as shown once weekly on the same day of the week.   You may inject in the sides of the stomach, outer thigh or arm as indicated in the brochure given. If you have any difficulties using the pen see the video at [email or web address]  You will feel fullness of the stomach with starting the medication and should try to keep the portions at meals small.  You may experience nausea in the first few days which usually gets better over time    After 4 weeks increase the dose to 0.5 mg weekly  If you have any questions or persistent side effects please call the office   You may also talk to a nurse educator with [employer] at [phone] Useful website: [email or web address]

## 2020-10-11 MED FILL — Dexamethasone Sodium Phosphate Inj 100 MG/10ML: INTRAMUSCULAR | Qty: 1 | Status: AC

## 2020-10-12 ENCOUNTER — Inpatient Hospital Stay (HOSPITAL_BASED_OUTPATIENT_CLINIC_OR_DEPARTMENT_OTHER): Payer: Medicare Other | Admitting: Oncology

## 2020-10-12 ENCOUNTER — Inpatient Hospital Stay: Payer: Medicare Other | Attending: Oncology

## 2020-10-12 ENCOUNTER — Inpatient Hospital Stay: Payer: Medicare Other

## 2020-10-12 ENCOUNTER — Other Ambulatory Visit: Payer: Self-pay

## 2020-10-12 VITALS — BP 157/67 | HR 103 | Temp 98.2°F | Resp 18 | Wt 246.2 lb

## 2020-10-12 VITALS — HR 73

## 2020-10-12 DIAGNOSIS — G893 Neoplasm related pain (acute) (chronic): Secondary | ICD-10-CM | POA: Diagnosis not present

## 2020-10-12 DIAGNOSIS — Z5189 Encounter for other specified aftercare: Secondary | ICD-10-CM | POA: Diagnosis not present

## 2020-10-12 DIAGNOSIS — C61 Malignant neoplasm of prostate: Secondary | ICD-10-CM | POA: Insufficient documentation

## 2020-10-12 DIAGNOSIS — Z79899 Other long term (current) drug therapy: Secondary | ICD-10-CM | POA: Diagnosis not present

## 2020-10-12 DIAGNOSIS — Z7982 Long term (current) use of aspirin: Secondary | ICD-10-CM | POA: Insufficient documentation

## 2020-10-12 DIAGNOSIS — D630 Anemia in neoplastic disease: Secondary | ICD-10-CM | POA: Diagnosis not present

## 2020-10-12 DIAGNOSIS — Z5111 Encounter for antineoplastic chemotherapy: Secondary | ICD-10-CM | POA: Insufficient documentation

## 2020-10-12 DIAGNOSIS — C7951 Secondary malignant neoplasm of bone: Secondary | ICD-10-CM | POA: Insufficient documentation

## 2020-10-12 DIAGNOSIS — Z95828 Presence of other vascular implants and grafts: Secondary | ICD-10-CM

## 2020-10-12 DIAGNOSIS — D6481 Anemia due to antineoplastic chemotherapy: Secondary | ICD-10-CM | POA: Diagnosis not present

## 2020-10-12 DIAGNOSIS — Z794 Long term (current) use of insulin: Secondary | ICD-10-CM | POA: Insufficient documentation

## 2020-10-12 DIAGNOSIS — Z192 Hormone resistant malignancy status: Secondary | ICD-10-CM | POA: Insufficient documentation

## 2020-10-12 LAB — CMP (CANCER CENTER ONLY)
ALT: 10 U/L (ref 0–44)
AST: 20 U/L (ref 15–41)
Albumin: 3.5 g/dL (ref 3.5–5.0)
Alkaline Phosphatase: 216 U/L — ABNORMAL HIGH (ref 38–126)
Anion gap: 9 (ref 5–15)
BUN: 16 mg/dL (ref 8–23)
CO2: 24 mmol/L (ref 22–32)
Calcium: 9 mg/dL (ref 8.9–10.3)
Chloride: 111 mmol/L (ref 98–111)
Creatinine: 0.83 mg/dL (ref 0.61–1.24)
GFR, Estimated: >60 mL/min (ref >60)
Glucose, Bld: 72 mg/dL (ref 70–99)
Potassium: 4.3 mmol/L (ref 3.5–5.1)
Sodium: 144 mmol/L (ref 135–145)
Total Bilirubin: 0.3 mg/dL (ref 0.3–1.2)
Total Protein: 6.8 g/dL (ref 6.5–8.1)

## 2020-10-12 LAB — CBC WITH DIFFERENTIAL (CANCER CENTER ONLY)
Abs Immature Granulocytes: 0.03 10*3/uL (ref 0.00–0.07)
Basophils Absolute: 0 10*3/uL (ref 0.0–0.1)
Basophils Relative: 0 %
Eosinophils Absolute: 0 10*3/uL (ref 0.0–0.5)
Eosinophils Relative: 0 %
HCT: 27.4 % — ABNORMAL LOW (ref 39.0–52.0)
Hemoglobin: 8.7 g/dL — ABNORMAL LOW (ref 13.0–17.0)
Immature Granulocytes: 0 %
Lymphocytes Relative: 15 %
Lymphs Abs: 1.1 10*3/uL (ref 0.7–4.0)
MCH: 31.1 pg (ref 26.0–34.0)
MCHC: 31.8 g/dL (ref 30.0–36.0)
MCV: 97.9 fL (ref 80.0–100.0)
Monocytes Absolute: 0.7 10*3/uL (ref 0.1–1.0)
Monocytes Relative: 10 %
Neutro Abs: 5.3 10*3/uL (ref 1.7–7.7)
Neutrophils Relative %: 75 %
Platelet Count: 150 10*3/uL (ref 150–400)
RBC: 2.8 MIL/uL — ABNORMAL LOW (ref 4.22–5.81)
RDW: 18.1 % — ABNORMAL HIGH (ref 11.5–15.5)
WBC Count: 7.2 10*3/uL (ref 4.0–10.5)
nRBC: 0 % (ref 0.0–0.2)

## 2020-10-12 MED ORDER — SODIUM CHLORIDE 0.9 % IV SOLN
10.0000 mg | Freq: Once | INTRAVENOUS | Status: AC
  Administered 2020-10-12: 10 mg via INTRAVENOUS
  Filled 2020-10-12: qty 10

## 2020-10-12 MED ORDER — SODIUM CHLORIDE 0.9% FLUSH
10.0000 mL | INTRAVENOUS | Status: DC | PRN
  Administered 2020-10-12: 10 mL

## 2020-10-12 MED ORDER — HEPARIN SOD (PORK) LOCK FLUSH 100 UNIT/ML IV SOLN
500.0000 [IU] | Freq: Once | INTRAVENOUS | Status: AC | PRN
  Administered 2020-10-12: 500 [IU]

## 2020-10-12 MED ORDER — SODIUM CHLORIDE 0.9% FLUSH
10.0000 mL | INTRAVENOUS | Status: AC | PRN
  Administered 2020-10-12: 10 mL

## 2020-10-12 MED ORDER — SODIUM CHLORIDE 0.9 % IV SOLN: Freq: Once | INTRAVENOUS | Status: AC

## 2020-10-12 MED ORDER — SODIUM CHLORIDE 0.9 % IV SOLN
75.0000 mg/m2 | Freq: Once | INTRAVENOUS | Status: AC
  Administered 2020-10-12: 170 mg via INTRAVENOUS
  Filled 2020-10-12: qty 17

## 2020-10-12 NOTE — Progress Notes (Signed)
Hematology and Oncology Follow Up Visit  Ammie Barrilleaux [id number] 1945/07/31 75 y.o. 10/12/2020 12:30 PM [name], [name], [name], MD   Principle Diagnosis: 75 year old man with advanced prostate cancer diagnosed in February 2021.  He has castration-resistant with disease to the bone after presenting of the current diagnosis with PSA 193.   Prior Therapy:  He status post CT-guided biopsy of the right iliac bone.  [name] started on April 20, 2019.  Xtandi 160 mg daily started in April 2021.  Therapy discontinued in March 2022 due to progression of disease.  Zytiga 1000 mg daily with prednisone 5 mg daily started in March 2022.   Current therapy:    Eligard 30 mg started in April 2021.  Next Eligard will be given in December 2022.  Taxotere chemotherapy 75 mg per metered square started on June 08, 2020.  He is here for cycle 7 of therapy.   Interim History: Mr. Mccracken is here for a follow-up visit.  Since the last visit, he reports no major changes in his health.  He continues to tolerate chemotherapy without any complaints.  He denies any nausea, vomiting or abdominal pain.  He denies any worsening neuropathy or fatigue.  His appetite remain excellent continues to gain weight.  He denies any hospitalization or illnesses.   Medications: Reviewed without changes. Current Outpatient Medications  Medication Sig Dispense Refill   acetaminophen (TYLENOL) 500 MG tablet Take 500 mg by mouth every 6 (six) hours as needed for mild pain.     ASPIRIN 81 PO Take 81 mg by mouth daily.      Calcium-Magnesium-Zinc 333-133-5 MG TABS Take 1 tablet by mouth daily.     Chromium 1 MG CAPS Take by mouth.     Continuous Blood Gluc Sensor (FREESTYLE LIBRE 2 SENSOR) MISC APPLY EVERY 14 DAYS, CHECK GLUCOSE BEFORE EACH MEAL, 2 HRS AFTER, IN THE MORNING AND BEFORE BEDTIME     dapagliflozin propanediol (FARXIGA) 5 MG TABS tablet Take 1 tablet (5 mg total) by mouth daily. 30 tablet 3   doxycycline  (VIBRA-TABS) 100 MG tablet  (Patient not taking: Reported on 10/04/2020)     DULoxetine (CYMBALTA) 60 MG capsule Take 60 mg by mouth daily.     insulin lispro (HUMALOG) 100 UNIT/ML KwikPen Junior Inject 0.2 mLs (20 Units total) into the skin 3 (three) times daily after meals. (Patient taking differently: Inject 7-25 Units into the skin 3 (three) times daily after meals. Per sliding scale) 3 mL 6   lidocaine (LIDODERM) 5 % Place 2 patches onto the skin daily. Remove & Discard patch within 12 hours or as directed by MD 30 patch 0   lidocaine-prilocaine (EMLA) cream Apply 1 application topically as needed. 30 g 0   loperamide (IMODIUM) 1 MG/5ML solution Take by mouth as needed for diarrhea or loose stools.     magnesium 30 MG tablet Take 30 mg by mouth 2 (two) times daily.     Menthol, Topical Analgesic, (BIOFREEZE EX) Apply topically.     Menthol-Camphor (TIGER BALM ARTHRITIS RUB EX) Apply 1 application topically 2 (two) times daily as needed (back pain).     ondansetron (ZOFRAN) 4 MG tablet Take 1 tablet (4 mg total) by mouth every 6 (six) hours as needed. for nausea 20 tablet 1   oxyCODONE-acetaminophen (PERCOCET) 7.5-325 MG tablet Take 1 tablet by mouth 4 (four) times daily as needed. 60 tablet 0   prochlorperazine (COMPAZINE) 10 MG tablet TAKE 1 TABLET BY MOUTH EVERY 6 HOURS AS  NEEDED FOR NAUSEA OR VOMITING. (Patient not taking: Reported on 10/04/2020) 30 tablet 0   rosuvastatin (CRESTOR) 10 MG tablet Take 1 tablet (10 mg total) by mouth daily. 90 tablet 3   TRESIBA FLEXTOUCH 100 UNIT/ML FlexTouch Pen Inject 36 Units into the skin at bedtime. 15 mL 3   No current facility-administered medications for this visit.   Facility-Administered Medications Ordered in Other Visits  Medication Dose Route Frequency Provider Last Rate Last Admin   sodium chloride flush (NS) 0.9 % injection 10 mL  10 mL Intracatheter PRN [name], MD         Allergies: No Known Allergies    Physical  Exam:     Blood pressure (!) 157/67, pulse (!) 103, temperature 98.2 F (36.8 C), resp. rate 18, weight 246 lb 3.2 oz (111.7 kg), SpO2 100 %.      ECOG: 1    General appearance: Comfortable appearing without any discomfort Head: Normocephalic without any trauma Oropharynx: Mucous membranes are moist and pink without any thrush or ulcers. Eyes: Pupils are equal and round reactive to light. Lymph nodes: No cervical, supraclavicular, inguinal or axillary lymphadenopathy.   Heart:regular rate and rhythm.  S1 and S2 without leg edema. Lung: Clear without any rhonchi or wheezes.  No dullness to percussion. Abdomin: Soft, nontender, nondistended with good bowel sounds.  No hepatosplenomegaly. Musculoskeletal: No joint deformity or effusion.  Full range of motion noted. Neurological: No deficits noted on motor, sensory and deep tendon reflex exam. Skin: No petechial rash or dryness.  Appeared moist.                 Lab Results: Lab Results  Component Value Date   WBC 6.5 09/21/2020   HGB 8.7 (L) 09/21/2020   HCT 27.7 (L) 09/21/2020   MCV 99.6 09/21/2020   PLT 157 09/21/2020     Chemistry      Component Value Date/Time   NA 141 09/29/2020 1052   K 4.3 09/29/2020 1052   CL 108 09/29/2020 1052   CO2 26 09/29/2020 1052   BUN 16 09/29/2020 1052   CREATININE 0.86 09/29/2020 1052   CREATININE 0.88 09/21/2020 0952      Component Value Date/Time   CALCIUM 9.1 09/29/2020 1052   ALKPHOS 295 (H) 09/21/2020 0952   AST 23 09/21/2020 0952   ALT 12 09/21/2020 0952   BILITOT 0.3 09/21/2020 0952       Results for DENIEL, BLAKEMAN (MRN [id number]) as of 10/12/2020 12:32  Ref. Range 08/31/2020 11:02 09/21/2020 09:52  Prostate Specific Ag, Serum Latest Ref Range: 0.0 - 4.0 ng/mL 47.6 (H) 52.1 (H)         Impression and Plan:   75 year old man with:  1.   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     He is  currently on Taxotere chemotherapy with excellent clinical response.  He reports improvement in his symptoms as well as a PSA decline.  His PSA has plateaued however and the risks and benefits of continuing this treatment were discussed.  Complications that include nausea, vomiting, myelosuppression among others were reiterated.  Different salvage therapy options such as lutetium 117 would be used subsequently.  For the time being I recommended continuing the same dose and schedule and update his staging scans upon completing chemotherapy.   2.  Androgen deprivation: Next Eligard will be in December 2022.  Complications including weight gain and hot flashes among others were reiterated.  3.  Bone directed therapy: Delton See has been offered and deferred for the time being.  I recommended calcium and vitamin D supplements.   4.  Prognosis and goals of care: His disease is incurable although aggressive measures are warranted given his reasonable performance status.   5.  Anemia: Related to malignancy and chemotherapy.  His hemoglobin is stable at 8.7 and does not require transfusion.   6.  Bone pain: Improved with Taxotere chemotherapy.  He is still requiring Percocet periodically.  7.  IV access: Port-A-Cath remains in use without any issues.   8..  Follow-up: He will return in 3 weeks for the next cycle of therapy.    30  minutes were dedicated to this visit.  Time was spent on reviewing his disease status, discussing treatment choices and addressing complications related to his cancer and cancer therapy.   Zola Button, MD 9/1/202212:30 PM

## 2020-10-12 NOTE — Patient Instructions (Signed)
[facility] MEDICAL [facility]COLOG[facility]   ?Discharge Instructions: ?Thank you for choosing [facility] to provide your oncology and hematology care.  ? ?If you have a lab appointment with the Cancer Center, please go directly to the Cancer Center and check in at the registration area. ?  ?Wear comfortable clothing and clothing appropriate for easy access to any Portacath or PICC line.  ? ?We strive to give you quality time with your provider. You may need to reschedule your appointment if you arrive late (15 or more minutes).  Arriving late affects you and other patients whose appointments are after yours.  Also, if you miss three or more appointments without notifying the office, you may be dismissed from the clinic at the provider?s discretion.    ?  ?For prescription refill requests, have your pharmacy contact our office and allow 72 hours for refills to be completed.   ? ?Today you received the following chemotherapy and/or immunotherapy agents: Docetaxel (Taxotere)    ?  ?To help prevent nausea and vomiting after your treatment, we encourage you to take your nausea medication as directed. ? ?BELOW ARE SYMPTOMS THAT SHOULD BE REPORTED IMMEDIATELY: ?*FEVER GREATER THAN 100.4 F (38 ?C) OR HIGHER ?*CHILLS OR SWEATING ?*NAUSEA AND VOMITING THAT IS NOT CONTROLLED WITH YOUR NAUSEA MEDICATION ?*UNUSUAL SHORTNESS OF BREATH ?*UNUSUAL BRUISING OR BLEEDING ?*URINARY PROBLEMS (pain or burning when urinating, or frequent urination) ?*BOWEL PROBLEMS (unusual diarrhea, constipation, pain near the anus) ?TENDERNESS IN MOUTH AND THROAT WITH OR WITHOUT PRESENCE OF ULCERS (sore throat, sores in mouth, or a toothache) ?UNUSUAL RASH, SWELLING OR PAIN  ?UNUSUAL VAGINAL DISCHARGE OR ITCHING  ? ?Items with * indicate a potential emergency and should be followed up as soon as possible or go to the Emergency Department if any problems should occur. ? ?Please show the CHEMOTHERAPY ALERT CARD or IMMUNOTHERAPY ALERT CARD at  check-in to the Emergency Department and triage nurse. ? ?Should you have questions after your visit or need to cancel or reschedule your appointment, please contact [facility]  Dept: [phone]  and follow the prompts.  Office hours are 8:00 a.m. to 4:30 p.m. Monday - Friday. Please note that voicemails left after 4:00 p.m. may not be returned until the following business day.  We are closed weekends and major holidays. You have access to a nurse at all times for urgent questions. Please call the main number to the clinic Dept: [phone] and follow the prompts. ? ? ?For any non-urgent questions, you may also contact your provider using MyChart. We now offer e-Visits for anyone 18 and older to request care online for non-urgent symptoms. For details visit [email or web address]. ?  ?Also download the MyChart app! Go to the app store, search "MyChart", open the app, select , and log in with your MyChart username and password. ? ?Due to Covid, a mask is required upon entering the hospital/clinic. If you do not have a mask, one will be given to you upon arrival. For doctor visits, patients may have 1 support person aged 18 or older with them. For treatment visits, patients cannot have anyone with them due to current Covid guidelines and our immunocompromised population.  ? ?

## 2020-10-13 ENCOUNTER — Telehealth: Payer: Self-pay | Admitting: Oncology

## 2020-10-13 LAB — PROSTATE-SPECIFIC AG, SERUM (LABCORP): Prostate Specific Ag, Serum: 80 ng/mL — ABNORMAL HIGH (ref 0.0–4.0)

## 2020-10-13 NOTE — Telephone Encounter (Signed)
Scheduled follow-up appointments per 9/1 los. Patient is aware. 

## 2020-10-14 ENCOUNTER — Inpatient Hospital Stay: Payer: Medicare Other

## 2020-10-14 ENCOUNTER — Other Ambulatory Visit: Payer: Self-pay

## 2020-10-14 VITALS — BP 173/63 | HR 85 | Temp 97.8°F | Resp 18

## 2020-10-14 DIAGNOSIS — C61 Malignant neoplasm of prostate: Secondary | ICD-10-CM

## 2020-10-14 DIAGNOSIS — C7951 Secondary malignant neoplasm of bone: Secondary | ICD-10-CM | POA: Diagnosis not present

## 2020-10-14 DIAGNOSIS — Z5111 Encounter for antineoplastic chemotherapy: Secondary | ICD-10-CM | POA: Diagnosis not present

## 2020-10-14 DIAGNOSIS — D630 Anemia in neoplastic disease: Secondary | ICD-10-CM | POA: Diagnosis not present

## 2020-10-14 DIAGNOSIS — Z192 Hormone resistant malignancy status: Secondary | ICD-10-CM | POA: Diagnosis not present

## 2020-10-14 DIAGNOSIS — D6481 Anemia due to antineoplastic chemotherapy: Secondary | ICD-10-CM | POA: Diagnosis not present

## 2020-10-14 MED ORDER — PEGFILGRASTIM-JMDB 6 MG/0.6ML ~~LOC~~ SOSY
6.0000 mg | PREFILLED_SYRINGE | Freq: Once | SUBCUTANEOUS | Status: AC
  Administered 2020-10-14: 6 mg via SUBCUTANEOUS

## 2020-10-14 MED ORDER — PEGFILGRASTIM-JMDB 6 MG/0.6ML ~~LOC~~ SOSY
PREFILLED_SYRINGE | SUBCUTANEOUS | Status: AC
  Filled 2020-10-14: qty 0.6

## 2020-10-14 NOTE — Patient Instructions (Signed)

## 2020-10-27 ENCOUNTER — Other Ambulatory Visit: Payer: Self-pay | Admitting: *Deleted

## 2020-10-27 NOTE — Patient Outreach (Signed)
[facility]) Care Management  [place]  10/28/2020   Metthew Minard April 04, 1945 [id number]   Outgoing call placed to member, state he is doing well with cancer treatments, still working to improve DM management.  Scheduled for thyroid biopsy on 9/29, denies questions about procedure.  Denies any urgent concerns, encouraged to contact this care manager with questions.    Encounter Medications:  Outpatient Encounter Medications as of 10/27/2020  Medication Sig   acetaminophen (TYLENOL) 500 MG tablet Take 500 mg by mouth every 6 (six) hours as needed for mild pain.   ASPIRIN 81 PO Take 81 mg by mouth daily.    Calcium-Magnesium-Zinc 333-133-5 MG TABS Take 1 tablet by mouth daily.   Chromium 1 MG CAPS Take by mouth.   Continuous Blood Gluc Sensor (FREESTYLE LIBRE 2 SENSOR) MISC APPLY EVERY 14 DAYS, CHECK GLUCOSE BEFORE EACH MEAL, 2 HRS AFTER, IN THE MORNING AND BEFORE BEDTIME   dapagliflozin propanediol (FARXIGA) 5 MG TABS tablet Take 1 tablet (5 mg total) by mouth daily.   doxycycline (VIBRA-TABS) 100 MG tablet  (Patient not taking: Reported on 10/04/2020)   DULoxetine (CYMBALTA) 60 MG capsule Take 60 mg by mouth daily.   insulin lispro (HUMALOG) 100 UNIT/ML KwikPen Junior Inject 0.2 mLs (20 Units total) into the skin 3 (three) times daily after meals. (Patient taking differently: Inject 7-25 Units into the skin 3 (three) times daily after meals. Per sliding scale)   lidocaine (LIDODERM) 5 % Place 2 patches onto the skin daily. Remove & Discard patch within 12 hours or as directed by MD   lidocaine-prilocaine (EMLA) cream Apply 1 application topically as needed.   loperamide (IMODIUM) 1 MG/5ML solution Take by mouth as needed for diarrhea or loose stools.   magnesium 30 MG tablet Take 30 mg by mouth 2 (two) times daily.   Menthol, Topical Analgesic, (BIOFREEZE EX) Apply topically.   Menthol-Camphor (TIGER BALM ARTHRITIS RUB EX) Apply 1 application topically 2 (two)  times daily as needed (back pain).   ondansetron (ZOFRAN) 4 MG tablet Take 1 tablet (4 mg total) by mouth every 6 (six) hours as needed. for nausea   oxyCODONE-acetaminophen (PERCOCET) 7.5-325 MG tablet Take 1 tablet by mouth 4 (four) times daily as needed.   prochlorperazine (COMPAZINE) 10 MG tablet TAKE 1 TABLET BY MOUTH EVERY 6 HOURS AS NEEDED FOR NAUSEA OR VOMITING. (Patient not taking: Reported on 10/04/2020)   rosuvastatin (CRESTOR) 10 MG tablet Take 1 tablet (10 mg total) by mouth daily.   TRESIBA FLEXTOUCH 100 UNIT/ML FlexTouch Pen Inject 36 Units into the skin at bedtime.   No facility-administered encounter medications on file as of 10/27/2020.    Functional Status:  In your present state of health, do you have any difficulty performing the following activities: 06/12/2020  Hearing? N  Vision? N  Difficulty concentrating or making decisions? N  Walking or climbing stairs? N  Dressing or bathing? N  Some recent data might be hidden    Fall/Depression Screening: Fall Risk  07/24/2020 03/28/2020 04/01/2019  Falls in the past year? 0 0 0  Number falls in past yr: - 0 0  Injury with Fall? - 0 0  Risk for fall due to : - - No Fall Risks  Follow up - - Falls prevention discussed   PHQ 2/9 Scores 07/24/2020 04/01/2019  PHQ - 2 Score 0 0    Assessment:   Care Plan Care Plan : Diabetes Type 2 (Adult)  Updates made by Orene Desanctis,  Adrik Khim, RN since 10/28/2020 12:00 AM     Problem: Elevated A1C   Priority: High     Goal: Patient will show DM management evidenced by decreased A1C to </=7 within the next 3 months   Start Date: 10/27/2020  Expected End Date: 01/26/2021  Priority: High  Note:   Current Barriers:  Knowledge Deficits related to plan of care for management of DMII  Chronic Disease Management support and education needs related to DMII Non-adherence to prescribed medication regimen  RNCM Clinical Goal(s):  Patient will verbalize understanding of plan for management of  DMII take all medications exactly as prescribed and will call provider for medication related questions demonstrate understanding of rationale for each prescribed medication Insulin [name]) attend all scheduled medical appointments: PCP and endocrinology demonstrate ongoing self health care management ability to manage DM, including diet, medication management, and activity  through collaboration with RN Care manager, provider, and care team.   Interventions: 1:1 collaboration with primary care provider regarding development and update of comprehensive plan of care as evidenced by provider attestation and co-signature Inter-disciplinary care team collaboration (see longitudinal plan of care) Evaluation of current treatment plan related to  self management and patient's adherence to plan as established by provider   Diabetes:  (Status: New goal.) Lab Results  Component Value Date   HGBA1C 8.5 (H) 08/18/2020  Assessed patient's understanding of A1c goal: <7% Provided education to patient about basic DM disease process; Reviewed medications with patient and discussed importance of medication adherence;        Reviewed prescribed diet with patient low sugars, low carbs; Reviewed scheduled/upcoming provider appointments including: Endocrinology on 10/5;         Advised patient, providing education and rationale, to check cbg 2-3 times a day and record        Assessed social determinant of health barriers;         Patient Goals/Self-Care Activities: Patient will self administer medications as prescribed Patient will attend all scheduled provider appointments Patient will call pharmacy for medication refills Patient will call provider office for new concerns or questions        Goals Addressed             This Visit's Progress    [facility] - Monitor and Manage My Blood Sugar   On track      Timeframe:  Short-Term Goal Priority:  High Start Date:   9/16                  Expected End  Date:  12/16 (reset, member not consistent)  Barriers: Health Behaviors                 - check blood sugar at prescribed times - check blood sugar before and after exercise - take the blood sugar log to all doctor visits - take the blood sugar meter to all doctor visits    Why is this important?   Checking your blood sugar at home helps to keep it from getting very high or very low.  Writing the results in a diary or log helps the doctor know how to care for you.  Your blood sugar log should have the time, date and the results.  Also, write down the amount of insulin or other medicine that you take.  Other information, like what you ate, exercise done and how you were feeling, will also be helpful.     Notes:   12/9 - Reminded of diabetic  diet and encouraged to continue medication adherence  2/15 - Reviewed medication changes and importance of checking blood sugars to decrease risk of hypoglycemic episodes  4/21 - Girlfriend aware of importance or managing blood sugars in addition to cancer treatments  5/25 - Girlfriend report blood sugars have remained less than 200 but not at 130 with fasting as recommended.  He is on his last day of 28 units, will increase to 30 units tomorrow per order if glucose remains greater then 130  6/23 - Per girlfriend, member continues to have difficulty managing blood sugars.  Has been as low as 59 and as high as 300's.  Will send more DM education, they will continue to monitor  7/22 - Member made aware to increase Tresiba dose by 2 units every 3 days until fasting glucose is 130 or less.  Blood sugar today was 200, last does increase was on 7/16.  He will continue titration once [name] is refilled  8/17 - Report [name] was filled, prescription dose was updated at pharmacy, now taking 38 units.  State blood sugar yesterday was 90, has not checked today, state he is needing to change his sensor.  Continues Humalog by taking 10 units for small meals  and 15 units for larger meals.   9/16 - State blood sugars have slightly increased, today was greater than 200.  Admits that he has not been consistently taking the full dose of Tresiba, only took 20 units yesterday.  Educated about importance of taking correct dose in effort to properly manage diabetes and decrease blood sugars consistently.       [facility] - Set My Target A1C   On track      Timeframe:  Long-Range Goal Priority:  Medium Start Date:       9/16                Expected End Date:  04/26/2021 (remains out of range, goal reset)  Barriers: Health Behaviors Knowledge                      - set target A1C less than 7   Why is this important?   Your target A1C is decided together by you and your doctor.  It is based on several things like your age and other health issues.    Notes:   12/9 - Encouraged again to contact new endocrinologist to schedule appointment  2/15 - Call place to PCP office to request A1C  4/21 - Appointment scheduled with Dr. Dwyane Dee for 5/16  5/25 - Reminded member/girlfriend of proper diabetic management.  Encouraged to stop late night/over night snacking.    6/23 - [name] was recommended, but unable to afford.  Referral placed to pharmacy team for suggestions  7/22 - Was seen by Dr. Dwyane Dee on 7/14.  Member still having some issues with adhering to insulin regime.  Reviewed instructions again, now taking 74 unites of [place].  However, state he has run out.  Call placed to [facility], notified that new order would need to be placed as they still have dose as 10 units and insurance will not pay for it until next week.  Call placed to Dr. Ronnie Derby office, request made to have prescription called in for updated dose.  8/24 - follow up with Dr. Dwyane Dee next week.  Request to have list of healthy snacks mailed as his blood sugars have been up due to snacking   9/16 - Member re-educated on titration procedure,  has not increased greater than 38 units but not consistent  with doses.  Verbalizes understanding of adherence, state he will work harder.  Follow up with Dr. Dwyane Dee on 10/5        Plan:  Follow-up: Patient agrees to Care Plan and Follow-up. Follow-up in 1 month(s).  [name], [place], MSN [place] [phone]

## 2020-11-01 MED FILL — Dexamethasone Sodium Phosphate Inj 100 MG/10ML: INTRAMUSCULAR | Qty: 1 | Status: AC

## 2020-11-02 ENCOUNTER — Inpatient Hospital Stay: Payer: Medicare Other

## 2020-11-02 ENCOUNTER — Other Ambulatory Visit: Payer: Self-pay

## 2020-11-02 ENCOUNTER — Inpatient Hospital Stay (HOSPITAL_BASED_OUTPATIENT_CLINIC_OR_DEPARTMENT_OTHER): Payer: Medicare Other | Admitting: Oncology

## 2020-11-02 VITALS — HR 95

## 2020-11-02 VITALS — BP 142/57 | HR 101 | Temp 100.4°F | Resp 19 | Ht 72.0 in | Wt 232.7 lb

## 2020-11-02 DIAGNOSIS — D6481 Anemia due to antineoplastic chemotherapy: Secondary | ICD-10-CM | POA: Diagnosis not present

## 2020-11-02 DIAGNOSIS — Z192 Hormone resistant malignancy status: Secondary | ICD-10-CM | POA: Diagnosis not present

## 2020-11-02 DIAGNOSIS — D630 Anemia in neoplastic disease: Secondary | ICD-10-CM | POA: Diagnosis not present

## 2020-11-02 DIAGNOSIS — C61 Malignant neoplasm of prostate: Secondary | ICD-10-CM

## 2020-11-02 DIAGNOSIS — Z794 Long term (current) use of insulin: Secondary | ICD-10-CM

## 2020-11-02 DIAGNOSIS — C7951 Secondary malignant neoplasm of bone: Secondary | ICD-10-CM | POA: Diagnosis not present

## 2020-11-02 DIAGNOSIS — Z5111 Encounter for antineoplastic chemotherapy: Secondary | ICD-10-CM | POA: Diagnosis not present

## 2020-11-02 DIAGNOSIS — E1165 Type 2 diabetes mellitus with hyperglycemia: Secondary | ICD-10-CM

## 2020-11-02 DIAGNOSIS — Z95828 Presence of other vascular implants and grafts: Secondary | ICD-10-CM

## 2020-11-02 LAB — CMP (CANCER CENTER ONLY)
ALT: 11 U/L (ref 0–44)
AST: 56 U/L — ABNORMAL HIGH (ref 15–41)
Albumin: 3.3 g/dL — ABNORMAL LOW (ref 3.5–5.0)
Alkaline Phosphatase: 1042 U/L — ABNORMAL HIGH (ref 38–126)
Anion gap: 10 (ref 5–15)
BUN: 31 mg/dL — ABNORMAL HIGH (ref 8–23)
CO2: 25 mmol/L (ref 22–32)
Calcium: 9.9 mg/dL (ref 8.9–10.3)
Chloride: 96 mmol/L — ABNORMAL LOW (ref 98–111)
Creatinine: 1.27 mg/dL — ABNORMAL HIGH (ref 0.61–1.24)
GFR, Estimated: 60 mL/min — ABNORMAL LOW (ref >60)
Glucose, Bld: 377 mg/dL — ABNORMAL HIGH (ref 70–99)
Potassium: 4.5 mmol/L (ref 3.5–5.1)
Sodium: 131 mmol/L — ABNORMAL LOW (ref 135–145)
Total Bilirubin: 0.7 mg/dL (ref 0.3–1.2)
Total Protein: 7.4 g/dL (ref 6.5–8.1)

## 2020-11-02 LAB — CBC WITH DIFFERENTIAL (CANCER CENTER ONLY)
Abs Immature Granulocytes: 0.13 10*3/uL — ABNORMAL HIGH (ref 0.00–0.07)
Basophils Absolute: 0 10*3/uL (ref 0.0–0.1)
Basophils Relative: 0 %
Eosinophils Absolute: 0 10*3/uL (ref 0.0–0.5)
Eosinophils Relative: 0 %
HCT: 28.2 % — ABNORMAL LOW (ref 39.0–52.0)
Hemoglobin: 9.3 g/dL — ABNORMAL LOW (ref 13.0–17.0)
Immature Granulocytes: 1 %
Lymphocytes Relative: 4 %
Lymphs Abs: 0.5 10*3/uL — ABNORMAL LOW (ref 0.7–4.0)
MCH: 30.4 pg (ref 26.0–34.0)
MCHC: 33 g/dL (ref 30.0–36.0)
MCV: 92.2 fL (ref 80.0–100.0)
Monocytes Absolute: 1.3 10*3/uL — ABNORMAL HIGH (ref 0.1–1.0)
Monocytes Relative: 10 %
Neutro Abs: 11.6 10*3/uL — ABNORMAL HIGH (ref 1.7–7.7)
Neutrophils Relative %: 85 %
Platelet Count: 131 10*3/uL — ABNORMAL LOW (ref 150–400)
RBC: 3.06 MIL/uL — ABNORMAL LOW (ref 4.22–5.81)
RDW: 17.9 % — ABNORMAL HIGH (ref 11.5–15.5)
WBC Count: 13.6 10*3/uL — ABNORMAL HIGH (ref 4.0–10.5)
nRBC: 0.1 % (ref 0.0–0.2)

## 2020-11-02 MED ORDER — SODIUM CHLORIDE 0.9% FLUSH
10.0000 mL | INTRAVENOUS | Status: DC | PRN
  Administered 2020-11-02: 10 mL

## 2020-11-02 MED ORDER — MORPHINE SULFATE (PF) 2 MG/ML IV SOLN
2.0000 mg | INTRAVENOUS | Status: DC | PRN
  Administered 2020-11-02: 2 mg via INTRAVENOUS
  Filled 2020-11-02: qty 1

## 2020-11-02 MED ORDER — SODIUM CHLORIDE 0.9 % IV SOLN
75.0000 mg/m2 | Freq: Once | INTRAVENOUS | Status: AC
  Administered 2020-11-02: 170 mg via INTRAVENOUS
  Filled 2020-11-02: qty 17

## 2020-11-02 MED ORDER — HEPARIN SOD (PORK) LOCK FLUSH 100 UNIT/ML IV SOLN
500.0000 [IU] | Freq: Once | INTRAVENOUS | Status: AC | PRN
  Administered 2020-11-02: 500 [IU]

## 2020-11-02 MED ORDER — SODIUM CHLORIDE 0.9 % IV SOLN
10.0000 mg | Freq: Once | INTRAVENOUS | Status: AC
  Administered 2020-11-02: 10 mg via INTRAVENOUS
  Filled 2020-11-02: qty 10

## 2020-11-02 MED ORDER — SODIUM CHLORIDE 0.9 % IV SOLN: Freq: Once | INTRAVENOUS | Status: AC

## 2020-11-02 MED ORDER — FENTANYL 25 MCG/HR TD PT72: 1.0000 | MEDICATED_PATCH | TRANSDERMAL | 0 refills | Status: DC

## 2020-11-02 MED ORDER — SODIUM CHLORIDE 0.9% FLUSH
10.0000 mL | Freq: Once | INTRAVENOUS | Status: AC
  Administered 2020-11-02: 10 mL via INTRAVENOUS

## 2020-11-02 NOTE — Progress Notes (Signed)
Hematology and Oncology Follow Up Visit  Stephen Lynch [id number] 11/06/1945 75 y.o. 11/02/2020 11:44 AM Stephen Lynch, [name], Stephen Rude, MD   Principle Diagnosis: 48 year old man with castration-resistant advanced prostate cancer diagnosed in February 2021.  He presented with PSA 1.93 and advanced disease.   Prior Therapy:  He status post CT-guided biopsy of the right iliac bone.  [name] started on April 20, 2019.  Xtandi 160 mg daily started in April 2021.  Therapy discontinued in March 2022 due to progression of disease.  Zytiga 1000 mg daily with prednisone 5 mg daily started in March 2022.   Current therapy:    Eligard 30 mg started in April 2021.  Next Eligard will be due in December 2022.  Taxotere chemotherapy 75 mg per metered square started on June 08, 2020.  He is here for cycle 8 of therapy.   Interim History: Stephen Lynch returns today for a follow-up visit.  Since the last visit, he reports a few complaints.  He has experienced worsening bone pain in the last 5 days associated with more debilitation and overall weakness.  His pain is rather diffuse including shoulder, neck and lower back as well as ribs.  That has translated into poor appetite and of lost weight.  He denies any fevers, chills or sweats.  He denies any nausea, vomiting or abdominal pain.  He denies any chest pain or palpitation.   Medications: Updated on review. Current Outpatient Medications  Medication Sig Dispense Refill   acetaminophen (TYLENOL) 500 MG tablet Take 500 mg by mouth every 6 (six) hours as needed for mild pain.     ASPIRIN 81 PO Take 81 mg by mouth daily.      Calcium-Magnesium-Zinc 333-133-5 MG TABS Take 1 tablet by mouth daily.     Chromium 1 MG CAPS Take by mouth.     Continuous Blood Gluc Sensor (FREESTYLE LIBRE 2 SENSOR) MISC APPLY EVERY 14 DAYS, CHECK GLUCOSE BEFORE EACH MEAL, 2 HRS AFTER, IN THE MORNING AND BEFORE BEDTIME     dapagliflozin propanediol (FARXIGA) 5 MG TABS tablet  Take 1 tablet (5 mg total) by mouth daily. 30 tablet 3   doxycycline (VIBRA-TABS) 100 MG tablet  (Patient not taking: Reported on 10/04/2020)     DULoxetine (CYMBALTA) 60 MG capsule Take 60 mg by mouth daily.     insulin lispro (HUMALOG) 100 UNIT/ML KwikPen Junior Inject 0.2 mLs (20 Units total) into the skin 3 (three) times daily after meals. (Patient taking differently: Inject 7-25 Units into the skin 3 (three) times daily after meals. Per sliding scale) 3 mL 6   lidocaine (LIDODERM) 5 % Place 2 patches onto the skin daily. Remove & Discard patch within 12 hours or as directed by MD 30 patch 0   lidocaine-prilocaine (EMLA) cream Apply 1 application topically as needed. 30 g 0   loperamide (IMODIUM) 1 MG/5ML solution Take by mouth as needed for diarrhea or loose stools.     magnesium 30 MG tablet Take 30 mg by mouth 2 (two) times daily.     Menthol, Topical Analgesic, (BIOFREEZE EX) Apply topically.     Menthol-Camphor (TIGER BALM ARTHRITIS RUB EX) Apply 1 application topically 2 (two) times daily as needed (back pain).     ondansetron (ZOFRAN) 4 MG tablet Take 1 tablet (4 mg total) by mouth every 6 (six) hours as needed. for nausea 20 tablet 1   oxyCODONE-acetaminophen (PERCOCET) 7.5-325 MG tablet Take 1 tablet by mouth 4 (four) times daily as needed. [place]  tablet 0   prochlorperazine (COMPAZINE) 10 MG tablet TAKE 1 TABLET BY MOUTH EVERY 6 HOURS AS NEEDED FOR NAUSEA OR VOMITING. (Patient not taking: Reported on 10/04/2020) 30 tablet 0   rosuvastatin (CRESTOR) 10 MG tablet Take 1 tablet (10 mg total) by mouth daily. 90 tablet 3   TRESIBA FLEXTOUCH 100 UNIT/ML FlexTouch Pen Inject 36 Units into the skin at bedtime. 15 mL 3   No current facility-administered medications for this visit.     Allergies: No Known Allergies    Physical Exam:       Blood pressure (!) 142/57, pulse (!) 101, temperature (!) 100.4 F (38 C), temperature source Oral, resp. rate 19, height 6' (1.829 m), weight 232 lb  11.2 oz (105.6 kg), SpO2 99 %.     ECOG: 1    General appearance: Alert, awake without any distress. Head: Atraumatic without abnormalities Oropharynx: Without any thrush or ulcers. Eyes: No scleral icterus. Lymph nodes: No lymphadenopathy noted in the cervical, supraclavicular, or axillary nodes Heart:regular rate and rhythm, without any murmurs or gallops.   Lung: Clear to auscultation without any rhonchi, wheezes or dullness to percussion. Abdomin: Soft, nontender without any shifting dullness or ascites. Musculoskeletal: No clubbing or cyanosis. Neurological: No motor or sensory deficits. Skin: No rashes or lesions.                Lab Results: Lab Results  Component Value Date   WBC 7.2 10/12/2020   HGB 8.7 (L) 10/12/2020   HCT 27.4 (L) 10/12/2020   MCV 97.9 10/12/2020   PLT 150 10/12/2020     Chemistry      Component Value Date/Time   NA 144 10/12/2020 1230   K 4.3 10/12/2020 1230   CL 111 10/12/2020 1230   CO2 24 10/12/2020 1230   BUN 16 10/12/2020 1230   CREATININE 0.83 10/12/2020 1230      Component Value Date/Time   CALCIUM 9.0 10/12/2020 1230   ALKPHOS 216 (H) 10/12/2020 1230   AST 20 10/12/2020 1230   ALT 10 10/12/2020 1230   BILITOT 0.3 10/12/2020 1230           Results for Stephen Lynch, Stephen Lynch (MRN [id number]) as of 11/02/2020 11:45  Ref. Range 08/31/2020 11:02 09/21/2020 09:52 10/12/2020 12:30  Prostate Specific Ag, Serum Latest Ref Range: 0.0 - 4.0 ng/mL 47.6 (H) 52.1 (H) 80.0 (H)      Impression and Plan:   75 year old man with:  1.   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     His disease status was updated at this time and treatment choices were reviewed.  His PSA started to rise after excellent initial response to therapy.  Treatment options moving forward were discussed.  I favor updating his staging scans in the near future with PSMA PET.  Pending these results,  Plucevto could be an option for salvage purposes.  Alternative option would be Jevtana chemotherapy.  His overall clinical status has deteriorated rather rapidly and pending the results of his PET scan will determine best course of action.  2.  Androgen deprivation: I recommended continuing this indefinitely.  Next injection will be given in December 2022.    3.  Bone directed therapy: I recommended calcium and vitamin D supplements for the time being.  Delton See option has been deferred.   4.  Prognosis and goals of care: Therapy remains palliative although aggressive measures are warranted given his reasonable performance status.   5.  Anemia: Related to malignancy and chemotherapy.  He has not required any transfusion at this time.  Hemoglobin today is 9.3 and does not require any intervention.   6.  Bone pain: Currently on Percocet with poorly controlled pain.  I will start fentanyl patch of 25 mcg with instructions how to use it.  Complication clinic constipation alteration of mentation were reviewed.  He is agreeable to proceed.   7.  IV access: Port-A-Cath in use currently without any issues.   8. Follow-up: In 3 weeks for repeat follow-up.    30  minutes were spent on this encounter.  Time was dedicated to reviewing laboratory data, disease status update, treatment choices discussion as well as addressing complications related to cancer and cancer therapy.   Zola Button, MD 9/22/202211:44 AM

## 2020-11-02 NOTE — Patient Instructions (Signed)
[facility]  Discharge Instructions: ?Thank you for choosing [facility] to provide your oncology and hematology care.  ? ?If you have a lab appointment with the Cancer Center, please go directly to the Cancer Center and check in at the registration area. ?  ?Wear comfortable clothing and clothing appropriate for easy access to any Portacath or PICC line.  ? ?We strive to give you quality time with your provider. You may need to reschedule your appointment if you arrive late (15 or more minutes).  Arriving late affects you and other patients whose appointments are after yours.  Also, if you miss three or more appointments without notifying the office, you may be dismissed from the clinic at the provider?s discretion.    ?  ?For prescription refill requests, have your pharmacy contact our office and allow 72 hours for refills to be completed.   ? ?Today you received the following chemotherapy and/or immunotherapy agents : Taxotere    ?  ?To help prevent nausea and vomiting after your treatment, we encourage you to take your nausea medication as directed. ? ?BELOW ARE SYMPTOMS THAT SHOULD BE REPORTED IMMEDIATELY: ?*FEVER GREATER THAN 100.4 F (38 ?C) OR HIGHER ?*CHILLS OR SWEATING ?*NAUSEA AND VOMITING THAT IS NOT CONTROLLED WITH YOUR NAUSEA MEDICATION ?*UNUSUAL SHORTNESS OF BREATH ?*UNUSUAL BRUISING OR BLEEDING ?*URINARY PROBLEMS (pain or burning when urinating, or frequent urination) ?*BOWEL PROBLEMS (unusual diarrhea, constipation, pain near the anus) ?TENDERNESS IN MOUTH AND THROAT WITH OR WITHOUT PRESENCE OF ULCERS (sore throat, sores in mouth, or a toothache) ?UNUSUAL RASH, SWELLING OR PAIN  ?UNUSUAL VAGINAL DISCHARGE OR ITCHING  ? ?Items with * indicate a potential emergency and should be followed up as soon as possible or go to the Emergency Department if any problems should occur. ? ?Please show the CHEMOTHERAPY ALERT CARD or IMMUNOTHERAPY ALERT CARD at check-in to  the Emergency Department and triage nurse. ? ?Should you have questions after your visit or need to cancel or reschedule your appointment, please contact [facility]  Dept: [phone]  and follow the prompts.  Office hours are 8:00 a.m. to 4:30 p.m. Monday - Friday. Please note that voicemails left after 4:00 p.m. may not be returned until the following business day.  We are closed weekends and major holidays. You have access to a nurse at all times for urgent questions. Please call the main number to the clinic Dept: [phone] and follow the prompts. ? ? ?For any non-urgent questions, you may also contact your provider using MyChart. We now offer e-Visits for anyone 18 and older to request care online for non-urgent symptoms. For details visit [email or web address]. ?  ?Also download the MyChart app! Go to the app store, search "MyChart", open the app, select [facility], and log in with your MyChart username and password. ? ?Due to Covid, a mask is required upon entering the hospital/clinic. If you do not have a mask, one will be given to you upon arrival. For doctor visits, patients may have 1 support person aged 18 or older with them. For treatment visits, patients cannot have anyone with them due to current Covid guidelines and our immunocompromised population.  ? ?

## 2020-11-03 LAB — PROSTATE-SPECIFIC AG, SERUM (LABCORP): Prostate Specific Ag, Serum: 205 ng/mL — ABNORMAL HIGH (ref 0.0–4.0)

## 2020-11-04 ENCOUNTER — Inpatient Hospital Stay: Payer: Medicare Other

## 2020-11-04 ENCOUNTER — Other Ambulatory Visit: Payer: Self-pay

## 2020-11-04 VITALS — BP 152/75 | HR 99 | Temp 98.9°F | Resp 18

## 2020-11-04 DIAGNOSIS — Z5111 Encounter for antineoplastic chemotherapy: Secondary | ICD-10-CM | POA: Diagnosis not present

## 2020-11-04 DIAGNOSIS — C7951 Secondary malignant neoplasm of bone: Secondary | ICD-10-CM | POA: Diagnosis not present

## 2020-11-04 DIAGNOSIS — C61 Malignant neoplasm of prostate: Secondary | ICD-10-CM | POA: Diagnosis not present

## 2020-11-04 DIAGNOSIS — D630 Anemia in neoplastic disease: Secondary | ICD-10-CM | POA: Diagnosis not present

## 2020-11-04 DIAGNOSIS — D6481 Anemia due to antineoplastic chemotherapy: Secondary | ICD-10-CM | POA: Diagnosis not present

## 2020-11-04 DIAGNOSIS — Z192 Hormone resistant malignancy status: Secondary | ICD-10-CM | POA: Diagnosis not present

## 2020-11-04 MED ORDER — PEGFILGRASTIM-JMDB 6 MG/0.6ML ~~LOC~~ SOSY
6.0000 mg | PREFILLED_SYRINGE | Freq: Once | SUBCUTANEOUS | Status: AC
  Administered 2020-11-04: 6 mg via SUBCUTANEOUS

## 2020-11-04 NOTE — Patient Instructions (Signed)

## 2020-11-09 ENCOUNTER — Other Ambulatory Visit: Payer: Medicare Other

## 2020-11-10 DIAGNOSIS — I10 Essential (primary) hypertension: Secondary | ICD-10-CM | POA: Diagnosis not present

## 2020-11-10 DIAGNOSIS — E7849 Other hyperlipidemia: Secondary | ICD-10-CM | POA: Diagnosis not present

## 2020-11-10 DIAGNOSIS — M130 Polyarthritis, unspecified: Secondary | ICD-10-CM | POA: Diagnosis not present

## 2020-11-10 DIAGNOSIS — E119 Type 2 diabetes mellitus without complications: Secondary | ICD-10-CM | POA: Diagnosis not present

## 2020-11-14 ENCOUNTER — Encounter (HOSPITAL_COMMUNITY)
Admission: RE | Admit: 2020-11-14 | Discharge: 2020-11-14 | Disposition: A | Discharge status: Home / Self Care | Payer: Medicare Other | Source: Ambulatory Visit | Attending: Oncology | Admitting: Oncology

## 2020-11-14 ENCOUNTER — Other Ambulatory Visit: Payer: Self-pay

## 2020-11-14 DIAGNOSIS — C7951 Secondary malignant neoplasm of bone: Secondary | ICD-10-CM | POA: Diagnosis not present

## 2020-11-14 DIAGNOSIS — R918 Other nonspecific abnormal finding of lung field: Secondary | ICD-10-CM | POA: Diagnosis not present

## 2020-11-14 DIAGNOSIS — C61 Malignant neoplasm of prostate: Secondary | ICD-10-CM | POA: Diagnosis not present

## 2020-11-14 MED ORDER — PIFLIFOLASTAT F 18 (PYLARIFY) INJECTION
9.0000 | Freq: Once | INTRAVENOUS | Status: AC
  Administered 2020-11-14: 9.38 via INTRAVENOUS

## 2020-11-15 ENCOUNTER — Encounter: Payer: Self-pay | Admitting: Endocrinology

## 2020-11-15 ENCOUNTER — Ambulatory Visit (INDEPENDENT_AMBULATORY_CARE_PROVIDER_SITE_OTHER): Payer: Medicare Other | Admitting: Endocrinology

## 2020-11-15 VITALS — BP 140/62 | HR 81 | Ht 72.0 in | Wt 243.0 lb

## 2020-11-15 DIAGNOSIS — E1165 Type 2 diabetes mellitus with hyperglycemia: Secondary | ICD-10-CM

## 2020-11-15 DIAGNOSIS — I10 Essential (primary) hypertension: Secondary | ICD-10-CM | POA: Diagnosis not present

## 2020-11-15 DIAGNOSIS — Z794 Long term (current) use of insulin: Secondary | ICD-10-CM

## 2020-11-15 NOTE — Progress Notes (Signed)
Patient ID: Jylan Loeza, male   DOB: 10/14/1945, 75 y.o.   MRN: [id number]           Reason for Appointment: Endocrinology follow-up   PCP: Dr. Criss Rosales   History of Present Illness:          Date of diagnosis of type 2 diabetes mellitus: 2007?       Background history:   He has been on insulin since about the time of diagnosis.  He thinks initially oral agents that were prescribed did not work No detailed history of his previous treatment is available and A1c from last year was over 12%  Recent history:   Most recent A1c is 8.5  Previously 10% done on 06/26/2020 Fructosamine is 282 vs 350  INSULIN regimen is: [place] 36 U at bedtime, Humalog 10-15 units before meals  Non-insulin hypoglycemic drugs the patient is taking are: Ozempic as below  Current management, blood sugar patterns and problems identified:  He has been irregular with taking his [name] as he thinks his chemotherapy makes him drowsy in the evenings However he was previously taking this in the morning If his blood sugars are significantly high he will take additional doses of Tresiba at night and this will cause low normal or low sugars in the morning He is likely not taking his Humalog consistently although he thinks he is trying to take it before eating, will miss a few doses especially blood sugars are normal preprandial He was told to start [facility] but he is taking this irregularly because of generalized weakness and also only taking 0.25 mg intermittently with a sample Overall blood sugars are higher than on his last visit especially after meals He is avoiding sweet drinks but generally eating some sweets at times and occasionally getting more juices Previously had not taken [place] because of cost   Typical meal intake: Breakfast is usually cereal and almond milk or grits and eggs.  Lunch fruits, tuna or other seafood dinner is rice, vegetables and protein.                Snacks are nuts, fruits, ice cream,  chocolate  Exercise: minimal, limited by weakness  Glucose monitoring:  done 1-2 times a day         Glucometer: Libre version 2  Interpretation of the download as follows  His blood sugars are highly variable overnight especially early morning but the highest blood sugars are before noon overall Also on an average of blood sugars are above the target range except between 4 AM-8 AM Overnight blood sugars are variably high but generally over 200 at midnight and variably improving by 6 AM including mild hypoglycemia a couple of times POSTPRANDIAL readings are significantly higher at all different times However also appears to be having marked rebound following low normal or low sugars Frequency of monitoring is somewhat intermittent in the last week Overall blood sugars are high preprandial at all meals especially breakfast which is late morning and over 200 at lunch and dinner   CGM use % of time 66  2-week average/GV 230/37  Time in range       35%  % Time Above 180 27  % Time above 250 35  % Time Below 70 1     PRE-MEAL Fasting Lunch Dinner Bedtime Overall  Glucose range:       Averages: 182    230   POST-MEAL PC Breakfast PC Lunch PC Dinner  Glucose range:  Averages: 275 251 240   Previously:            Dietician visit, most recent: never  Weight history:  Wt Readings from Last 3 Encounters:  11/15/20 243 lb (110.2 kg)  11/02/20 232 lb 11.2 oz (105.6 kg)  10/12/20 246 lb 3.2 oz (111.7 kg)    Glycemic control:   Lab Results  Component Value Date   HGBA1C 8.5 (H) 08/18/2020   HGBA1C 10.0 (A) 06/26/2020   Lab Results  Component Value Date   LDLCALC 72 07/18/2020   CREATININE 1.27 (H) 11/02/2020   No results found for: MICRALBCREAT  Lab Results  Component Value Date   FRUCTOSAMINE 282 09/29/2020   FRUCTOSAMINE 350 (H) 07/18/2020    No visits with results within 1 Week(s) from this visit.  Latest known visit with results is:  Infusion on  11/02/2020  Component Date Value Ref Range Status   WBC Count 11/02/2020 13.6 (A) 4.0 - 10.5 K/uL Final   RBC 11/02/2020 3.06 (A) 4.22 - 5.81 MIL/uL Final   Hemoglobin 11/02/2020 9.3 (A) 13.0 - 17.0 g/dL Final   HCT 11/02/2020 28.2 (A) 39.0 - 52.0 % Final   MCV 11/02/2020 92.2  80.0 - 100.0 fL Final   MCH 11/02/2020 30.4  26.0 - 34.0 pg Final   MCHC 11/02/2020 33.0  30.0 - 36.0 g/dL Final   RDW 11/02/2020 17.9 (A) 11.5 - 15.5 % Final   Platelet Count 11/02/2020 131 (A) 150 - 400 K/uL Final   nRBC 11/02/2020 0.1  0.0 - 0.2 % Final   Neutrophils Relative % 11/02/2020 85  % Final   Neutro Abs 11/02/2020 11.6 (A) 1.7 - 7.7 K/uL Final   Lymphocytes Relative 11/02/2020 4  % Final   Lymphs Abs 11/02/2020 0.5 (A) 0.7 - 4.0 K/uL Final   Monocytes Relative 11/02/2020 10  % Final   Monocytes Absolute 11/02/2020 1.3 (A) 0.1 - 1.0 K/uL Final   Eosinophils Relative 11/02/2020 0  % Final   Eosinophils Absolute 11/02/2020 0.0  0.0 - 0.5 K/uL Final   Basophils Relative 11/02/2020 0  % Final   Basophils Absolute 11/02/2020 0.0  0.0 - 0.1 K/uL Final   Immature Granulocytes 11/02/2020 1  % Final   Abs Immature Granulocytes 11/02/2020 0.13 (A) 0.00 - 0.07 K/uL Final   Performed at [facility], [place] [address] [place], [place] [address]   Sodium 11/02/2020 131 (A) 135 - 145 mmol/L Final   Potassium 11/02/2020 4.5  3.5 - 5.1 mmol/L Final   Chloride 11/02/2020 96 (A) 98 - 111 mmol/L Final   CO2 11/02/2020 25  22 - 32 mmol/L Final   Glucose, Bld 11/02/2020 377 (A) 70 - 99 mg/dL Final   Glucose reference range applies only to samples taken after fasting for at least 8 hours.   BUN 11/02/2020 31 (A) 8 - 23 mg/dL Final   Creatinine 11/02/2020 1.27 (A) 0.61 - 1.24 mg/dL Final   Calcium 11/02/2020 9.9  8.9 - 10.3 mg/dL Final   Total Protein 11/02/2020 7.4  6.5 - 8.1 g/dL Final   Albumin 11/02/2020 3.3 (A) 3.5 - 5.0 g/dL Final   AST 11/02/2020 56 (A) 15 - 41 U/L Final   ALT 11/02/2020  11  0 - 44 U/L Final   Alkaline Phosphatase 11/02/2020 1,042 (A) 38 - 126 U/L Final   Total Bilirubin 11/02/2020 0.7  0.3 - 1.2 mg/dL Final   GFR, Estimated 11/02/2020 60 (A) >60 mL/min Final  Comment: (NOTE) Calculated using the CKD-EPI Creatinine Equation (2021)    Anion gap 11/02/2020 10  5 - 15 Final   Performed at [facility], [place] [address] [place], [place] [address]   Prostate Specific Ag, Serum 11/02/2020 205.0 (A) 0.0 - 4.0 ng/mL Final   Comment: (NOTE) Results confirmed on dilution. Roche ECLIA methodology. According to the American Urological Association, Serum PSA should decrease and remain at undetectable levels after radical prostatectomy. The AUA defines biochemical recurrence as an initial PSA value 0.2 ng/mL or greater followed by a subsequent confirmatory PSA value 0.2 ng/mL or greater. Values obtained with different assay methods or kits cannot be used interchangeably. Results cannot be interpreted as absolute evidence of the presence or absence of malignant disease. Performed At: [facility] [address] [place], [place] [id number] Rush Farmer MD FX:[id number]     Allergies as of 11/15/2020   No Known Allergies      Medication List        Accurate as of November 15, 2020  3:06 PM. If you have any questions, ask your nurse or doctor.          acetaminophen 500 MG tablet Commonly known as: TYLENOL Take 500 mg by mouth every 6 (six) hours as needed for mild pain.   ASPIRIN 81 PO Take 81 mg by mouth daily.   BIOFREEZE EX Apply topically.   Calcium-Magnesium-Zinc 333-133-5 MG Tabs Take 1 tablet by mouth daily.   Chromium 1 MG Caps Take by mouth.   dapagliflozin propanediol 5 MG Tabs tablet Commonly known as: Farxiga Take 1 tablet (5 mg total) by mouth daily.   doxycycline 100 MG tablet Commonly known as: VIBRA-TABS   DULoxetine 60 MG capsule Commonly known as: CYMBALTA Take 60 mg by mouth  daily.   fentaNYL 25 MCG/HR Commonly known as: [place] 1 patch onto the skin every 3 (three) days.   FreeStyle Libre 2 Sensor Misc APPLY EVERY 14 DAYS, CHECK GLUCOSE BEFORE EACH MEAL, 2 HRS AFTER, IN THE MORNING AND BEFORE BEDTIME   HumaLOG Junior KwikPen 100 UNIT/ML KwikPen Junior Generic drug: insulin lispro Inject 0.2 mLs (20 Units total) into the skin 3 (three) times daily after meals. What changed:  how much to take additional instructions   lidocaine 5 % Commonly known as: LIDODERM Place 2 patches onto the skin daily. Remove & Discard patch within 12 hours or as directed by MD   lidocaine-prilocaine cream Commonly known as: EMLA Apply 1 application topically as needed.   loperamide 1 MG/5ML solution Commonly known as: IMODIUM Take by mouth as needed for diarrhea or loose stools.   magnesium 30 MG tablet Take 30 mg by mouth 2 (two) times daily.   ondansetron 4 MG tablet Commonly known as: ZOFRAN Take 1 tablet (4 mg total) by mouth every 6 (six) hours as needed. for nausea   oxyCODONE-acetaminophen 7.5-325 MG tablet Commonly known as: PERCOCET Take 1 tablet by mouth 4 (four) times daily as needed.   prochlorperazine 10 MG tablet Commonly known as: COMPAZINE TAKE 1 TABLET BY MOUTH EVERY 6 HOURS AS NEEDED FOR NAUSEA OR VOMITING.   rosuvastatin 10 MG tablet Commonly known as: CRESTOR Take 1 tablet (10 mg total) by mouth daily.   TIGER BALM ARTHRITIS RUB EX Apply 1 application topically 2 (two) times daily as needed (back pain).   [name] FlexTouch 100 UNIT/ML FlexTouch Pen Generic drug: insulin degludec Inject 36 Units into the skin at bedtime.  Allergies: No Known Allergies  Past Medical History:  Diagnosis Date   Cancer ([place])    prostate   Diabetes mellitus without complication ([place])    Dyslipidemia    Hypertension     Past Surgical History:  Procedure Laterality Date   EYE SURGERY     IR IMAGING GUIDED PORT INSERTION  06/12/2020     Family History  Problem Relation Age of Onset   CAD Mother 18   Other Father 43       age   Other Brother 64       unknown cause    Social History:  reports that he has quit smoking. His smoking use included cigarettes. He has a 7.50 pack-year smoking history. He has never used smokeless tobacco. He reports that he does not currently use alcohol. He reports that he does not currently use drugs.   Review of Systems   Lipid history: Has been treated with Crestor by PCP    Lab Results  Component Value Date   CHOL 142 07/18/2020   HDL 53.80 07/18/2020   LDLCALC 72 07/18/2020   TRIG 80.0 07/18/2020   CHOLHDL 3 07/18/2020           Hypertension: Has been present, previously on enalapril, managed by PCP  BP Readings from Last 3 Encounters:  11/15/20 140/62  11/04/20 (!) 152/75  11/02/20 (!) 142/57    Most recent eye exam was in 2022  Most recent foot exam: 5/22  Currently known complications of diabetes: Erectile dysfunction  MULTINODULAR goiter:  He has 3 abnormal lesions and radiologist has recommended biopsy of 2 of the solid nodules with highest TI-RADS 4 Location: Right; Inferior   Maximum size: 2.3 cm; Other 2 dimensions: 2.1 x 2.1 cm Composition: solid/almost completely solid (2)   Echogenicity: hypoechoic (2)    Biopsy has been recommended but he wants to wait till his prostate cancer treatment is complete  Lab Results  Component Value Date   TSH 0.76 07/18/2020     LABS:  No visits with results within 1 Week(s) from this visit.  Latest known visit with results is:  Infusion on 11/02/2020  Component Date Value Ref Range Status   WBC Count 11/02/2020 13.6 (A) 4.0 - 10.5 K/uL Final   RBC 11/02/2020 3.06 (A) 4.22 - 5.81 MIL/uL Final   Hemoglobin 11/02/2020 9.3 (A) 13.0 - 17.0 g/dL Final   HCT 11/02/2020 28.2 (A) 39.0 - 52.0 % Final   MCV 11/02/2020 92.2  80.0 - 100.0 fL Final   MCH 11/02/2020 30.4  26.0 - 34.0 pg Final   MCHC 11/02/2020 33.0   30.0 - 36.0 g/dL Final   RDW 11/02/2020 17.9 (A) 11.5 - 15.5 % Final   Platelet Count 11/02/2020 131 (A) 150 - 400 K/uL Final   nRBC 11/02/2020 0.1  0.0 - 0.2 % Final   Neutrophils Relative % 11/02/2020 85  % Final   Neutro Abs 11/02/2020 11.6 (A) 1.7 - 7.7 K/uL Final   Lymphocytes Relative 11/02/2020 4  % Final   Lymphs Abs 11/02/2020 0.5 (A) 0.7 - 4.0 K/uL Final   Monocytes Relative 11/02/2020 10  % Final   Monocytes Absolute 11/02/2020 1.3 (A) 0.1 - 1.0 K/uL Final   Eosinophils Relative 11/02/2020 0  % Final   Eosinophils Absolute 11/02/2020 0.0  0.0 - 0.5 K/uL Final   Basophils Relative 11/02/2020 0  % Final   Basophils Absolute 11/02/2020 0.0  0.0 - 0.1 K/uL Final   Immature Granulocytes  11/02/2020 1  % Final   Abs Immature Granulocytes 11/02/2020 0.13 (A) 0.00 - 0.07 K/uL Final   Performed at [facility], [place] [address], [place], [place] [address]   Sodium 11/02/2020 131 (A) 135 - 145 mmol/L Final   Potassium 11/02/2020 4.5  3.5 - 5.1 mmol/L Final   Chloride 11/02/2020 96 (A) 98 - 111 mmol/L Final   CO2 11/02/2020 25  22 - 32 mmol/L Final   Glucose, Bld 11/02/2020 377 (A) 70 - 99 mg/dL Final   Glucose reference range applies only to samples taken after fasting for at least 8 hours.   BUN 11/02/2020 31 (A) 8 - 23 mg/dL Final   Creatinine 11/02/2020 1.27 (A) 0.61 - 1.24 mg/dL Final   Calcium 11/02/2020 9.9  8.9 - 10.3 mg/dL Final   Total Protein 11/02/2020 7.4  6.5 - 8.1 g/dL Final   Albumin 11/02/2020 3.3 (A) 3.5 - 5.0 g/dL Final   AST 11/02/2020 56 (A) 15 - 41 U/L Final   ALT 11/02/2020 11  0 - 44 U/L Final   Alkaline Phosphatase 11/02/2020 1,042 (A) 38 - 126 U/L Final   Total Bilirubin 11/02/2020 0.7  0.3 - 1.2 mg/dL Final   GFR, Estimated 11/02/2020 60 (A) >60 mL/min Final   Comment: (NOTE) Calculated using the CKD-EPI Creatinine Equation (2021)    Anion gap 11/02/2020 10  5 - 15 Final   Performed at [facility], [place]  [address], [place], [place] [address]   Prostate Specific Ag, Serum 11/02/2020 205.0 (A) 0.0 - 4.0 ng/mL Final   Comment: (NOTE) Results confirmed on dilution. Roche ECLIA methodology. According to the American Urological Association, Serum PSA should decrease and remain at undetectable levels after radical prostatectomy. The AUA defines biochemical recurrence as an initial PSA value 0.2 ng/mL or greater followed by a subsequent confirmatory PSA value 0.2 ng/mL or greater. Values obtained with different assay methods or kits cannot be used interchangeably. Results cannot be interpreted as absolute evidence of the presence or absence of malignant disease. Performed At: [facility] [place], [place] [id number] Rush Farmer MD NU:[id number]     Physical Examination:  BP 140/62 (BP Location: Right Arm, Patient Position: Sitting, Cuff Size: Normal)   Pulse 81   Ht 6' (1.829 m)   Wt 243 lb (110.2 kg)   SpO2 99%   BMI 32.96 kg/m         ASSESSMENT:  Diabetes type 2 on insulin  See history of present illness for detailed discussion of current diabetes management, blood sugar patterns and problems identified  Most recent A1c is 8.5 %   Current treatment regimen is irregular Ozempic along with basal bolus insulin Blood sugars are reviewed in detail from his freestyle libre download   Blood sugars are worsening compared to his last visit as time in target is only 35% compared to 55%  He has likely not taken any of his insulin doses consistently and also Ozempic Some of this may be related to his having side effects of chemotherapy However he has blood sugars frequently over 350 on his sensor related to missing his insulin doses or skipping the Humalog if blood sugars are near normal before eating  He has been to the diabetes educator before but likely needs more education to understand how he needs to manage his diabetes and insulin  Hypertension:  Followed by PCP and blood pressure is relatively better today   PLAN:  He will see the diabetes educator again For better compliance he can take his [name] at the same time as his Humalog and explained the differences between basal and bolus insulin and the need to take both of them together Since it is unclear how much basal insulin he will need he will only take 34 units for now However needs to go up to 20-25 units of the Humalog for his meals Go up to 0.5 on the Ozempic Keep a record of his insulin doses on the log sheet that was provided He needs to take Humalog BEFORE every meal regardless of [place] blood sugar, may reduce 5 units if blood sugars are low normal Call if blood sugars are not improving  Needs to follow-up with his PCP regarding continued high blood pressure but consider starting back on ACE inhibitor  Follow-up: 2 months  Total visit time including counseling = 30 Minutes  Patient Instructions  Tresiba at supper daily 34 units    HUMALOG 20-25  UNITS 15 min before meals  Reduce juices and sweets  Ozempic 0.5mg  weekly every Wednesdays       [name] 11/15/2020, 3:06 PM   Note: This office note was prepared with Dragon voice recognition system technology. Any transcriptional errors that result from this process are unintentional.

## 2020-11-15 NOTE — Patient Instructions (Addendum)
Tresiba at supper daily 34 units    HUMALOG 20-25  UNITS 15 min before meals  Reduce juices and sweets  Ozempic 0.5mg  weekly every Wednesdays

## 2020-11-16 ENCOUNTER — Other Ambulatory Visit: Payer: Self-pay | Admitting: Oncology

## 2020-11-16 DIAGNOSIS — D649 Anemia, unspecified: Secondary | ICD-10-CM

## 2020-11-16 DIAGNOSIS — C61 Malignant neoplasm of prostate: Secondary | ICD-10-CM

## 2020-11-21 ENCOUNTER — Telehealth: Payer: Self-pay | Admitting: *Deleted

## 2020-11-21 NOTE — Telephone Encounter (Signed)
AAA enrollment form for pluvicto faxed to [facility] in Nuclear Medicine.  Fax confirmation received.

## 2020-11-22 ENCOUNTER — Other Ambulatory Visit (HOSPITAL_COMMUNITY): Payer: Medicare Other

## 2020-11-22 ENCOUNTER — Other Ambulatory Visit: Payer: Self-pay | Admitting: Oncology

## 2020-11-22 ENCOUNTER — Telehealth: Payer: Self-pay | Admitting: *Deleted

## 2020-11-22 MED ORDER — OXYCODONE-ACETAMINOPHEN 7.5-325 MG PO TABS: 1.0000 | ORAL_TABLET | Freq: Four times a day (QID) | ORAL | 0 refills | Status: DC | PRN

## 2020-11-22 MED FILL — Dexamethasone Sodium Phosphate Inj 100 MG/10ML: INTRAMUSCULAR | Qty: 1 | Status: AC

## 2020-11-22 NOTE — Telephone Encounter (Signed)
Stephen Lynch states he needs a refill of hydrocodone

## 2020-11-23 ENCOUNTER — Inpatient Hospital Stay (HOSPITAL_BASED_OUTPATIENT_CLINIC_OR_DEPARTMENT_OTHER): Payer: Medicare Other | Admitting: Oncology

## 2020-11-23 ENCOUNTER — Other Ambulatory Visit: Payer: Self-pay

## 2020-11-23 ENCOUNTER — Other Ambulatory Visit: Payer: Self-pay | Admitting: *Deleted

## 2020-11-23 ENCOUNTER — Inpatient Hospital Stay: Payer: Medicare Other | Attending: Oncology

## 2020-11-23 ENCOUNTER — Inpatient Hospital Stay: Payer: Medicare Other

## 2020-11-23 VITALS — BP 156/65 | HR 94 | Temp 98.7°F | Resp 17 | Ht 72.0 in | Wt 231.1 lb

## 2020-11-23 VITALS — BP 158/63 | HR 88 | Temp 99.4°F | Resp 20

## 2020-11-23 DIAGNOSIS — C61 Malignant neoplasm of prostate: Secondary | ICD-10-CM

## 2020-11-23 DIAGNOSIS — D630 Anemia in neoplastic disease: Secondary | ICD-10-CM | POA: Diagnosis not present

## 2020-11-23 DIAGNOSIS — Z79899 Other long term (current) drug therapy: Secondary | ICD-10-CM | POA: Insufficient documentation

## 2020-11-23 DIAGNOSIS — Z5111 Encounter for antineoplastic chemotherapy: Secondary | ICD-10-CM | POA: Insufficient documentation

## 2020-11-23 DIAGNOSIS — Z95828 Presence of other vascular implants and grafts: Secondary | ICD-10-CM

## 2020-11-23 DIAGNOSIS — C7951 Secondary malignant neoplasm of bone: Secondary | ICD-10-CM | POA: Insufficient documentation

## 2020-11-23 DIAGNOSIS — Z7982 Long term (current) use of aspirin: Secondary | ICD-10-CM | POA: Insufficient documentation

## 2020-11-23 DIAGNOSIS — D649 Anemia, unspecified: Secondary | ICD-10-CM

## 2020-11-23 LAB — CBC WITH DIFFERENTIAL (CANCER CENTER ONLY)
Abs Immature Granulocytes: 0.08 10*3/uL — ABNORMAL HIGH (ref 0.00–0.07)
Basophils Absolute: 0 10*3/uL (ref 0.0–0.1)
Basophils Relative: 0 %
Eosinophils Absolute: 0 10*3/uL (ref 0.0–0.5)
Eosinophils Relative: 0 %
HCT: 23.6 % — ABNORMAL LOW (ref 39.0–52.0)
Hemoglobin: 7.5 g/dL — ABNORMAL LOW (ref 13.0–17.0)
Immature Granulocytes: 1 %
Lymphocytes Relative: 6 %
Lymphs Abs: 0.7 10*3/uL (ref 0.7–4.0)
MCH: 28.5 pg (ref 26.0–34.0)
MCHC: 31.8 g/dL (ref 30.0–36.0)
MCV: 89.7 fL (ref 80.0–100.0)
Monocytes Absolute: 1.2 10*3/uL — ABNORMAL HIGH (ref 0.1–1.0)
Monocytes Relative: 11 %
Neutro Abs: 9.3 10*3/uL — ABNORMAL HIGH (ref 1.7–7.7)
Neutrophils Relative %: 82 %
Platelet Count: 228 10*3/uL (ref 150–400)
RBC: 2.63 MIL/uL — ABNORMAL LOW (ref 4.22–5.81)
RDW: 18.6 % — ABNORMAL HIGH (ref 11.5–15.5)
WBC Count: 11.3 10*3/uL — ABNORMAL HIGH (ref 4.0–10.5)
nRBC: 0 % (ref 0.0–0.2)

## 2020-11-23 LAB — CMP (CANCER CENTER ONLY)
ALT: 15 U/L (ref 0–44)
AST: 33 U/L (ref 15–41)
Albumin: 3 g/dL — ABNORMAL LOW (ref 3.5–5.0)
Alkaline Phosphatase: 519 U/L — ABNORMAL HIGH (ref 38–126)
Anion gap: 9 (ref 5–15)
BUN: 16 mg/dL (ref 8–23)
CO2: 26 mmol/L (ref 22–32)
Calcium: 8.3 mg/dL — ABNORMAL LOW (ref 8.9–10.3)
Chloride: 97 mmol/L — ABNORMAL LOW (ref 98–111)
Creatinine: 0.9 mg/dL (ref 0.61–1.24)
GFR, Estimated: >60 mL/min (ref >60)
Glucose, Bld: 362 mg/dL — ABNORMAL HIGH (ref 70–99)
Potassium: 4.2 mmol/L (ref 3.5–5.1)
Sodium: 132 mmol/L — ABNORMAL LOW (ref 135–145)
Total Bilirubin: 0.6 mg/dL (ref 0.3–1.2)
Total Protein: 7.3 g/dL (ref 6.5–8.1)

## 2020-11-23 LAB — PREPARE RBC (CROSSMATCH)

## 2020-11-23 LAB — SAMPLE TO BLOOD BANK

## 2020-11-23 MED ORDER — SODIUM CHLORIDE 0.9% FLUSH
10.0000 mL | INTRAVENOUS | Status: DC | PRN
  Administered 2020-11-23: 10 mL via INTRAVENOUS

## 2020-11-23 MED ORDER — SODIUM CHLORIDE 0.9% IV SOLUTION
250.0000 mL | Freq: Once | INTRAVENOUS | Status: AC
Start: 2020-11-23 — End: 2020-11-23
  Administered 2020-11-23: 250 mL via INTRAVENOUS

## 2020-11-23 MED ORDER — ACETAMINOPHEN 325 MG PO TABS: 650.0000 mg | ORAL_TABLET | Freq: Once | ORAL | Status: DC

## 2020-11-23 MED ORDER — DIPHENHYDRAMINE HCL 25 MG PO CAPS
25.0000 mg | ORAL_CAPSULE | Freq: Once | ORAL | Status: AC
  Administered 2020-11-23: 25 mg via ORAL
  Filled 2020-11-23: qty 1

## 2020-11-23 MED ORDER — SODIUM CHLORIDE 0.9% FLUSH
10.0000 mL | Freq: Once | INTRAVENOUS | Status: AC
  Administered 2020-11-23: 10 mL via INTRAVENOUS

## 2020-11-23 MED ORDER — HEPARIN SOD (PORK) LOCK FLUSH 100 UNIT/ML IV SOLN
500.0000 [IU] | Freq: Once | INTRAVENOUS | Status: AC
  Administered 2020-11-23: 500 [IU] via INTRAVENOUS

## 2020-11-23 NOTE — Progress Notes (Signed)
Per Dr. Alen Blew, "OK To infuse blood at 377ml/hr since he's getting 2units of PRBCs today."

## 2020-11-23 NOTE — Progress Notes (Signed)
Dr. Alen Blew - verbal order "No chemo today. He needs two units of blood. Tylenol 650 mg PO and Benadryl 25 mg PO Confirmed orders with [facility]

## 2020-11-23 NOTE — Patient Instructions (Signed)
Blood Transfusion, Adult, Care After This sheet gives you information about how to care for yourself after your procedure. Your doctor may also give you more specific instructions. If you have problems or questions, contact your doctor. What can I expect after the procedure? After the procedure, it is common to have: Bruising and soreness at the IV site. A headache. Follow these instructions at home: Insertion site care   Follow instructions from your doctor about how to take care of your insertion site. This is where an IV tube was put into your vein. Make sure you: Wash your hands with soap and water before and after you change your bandage (dressing). If you cannot use soap and water, use hand sanitizer. Change your bandage as told by your doctor. Check your insertion site every day for signs of infection. Check for: Redness, swelling, or pain. Bleeding from the site. Warmth. Pus or a bad smell. General instructions Take over-the-counter and prescription medicines only as told by your doctor. Rest as told by your doctor. Go back to your normal activities as told by your doctor. Keep all follow-up visits as told by your doctor. This is important. Contact a doctor if: You have itching or red, swollen areas of skin (hives). You feel worried or nervous (anxious). You feel weak after doing your normal activities. You have redness, swelling, warmth, or pain around the insertion site. You have blood coming from the insertion site, and the blood does not stop with pressure. You have pus or a bad smell coming from the insertion site. Get help right away if: You have signs of a serious reaction. This may be coming from an allergy or the body's defense system (immune system). Signs include: Trouble breathing or shortness of breath. Swelling of the face or feeling warm (flushed). Fever or chills. Head, chest, or back pain. Dark pee (urine) or blood in the pee. Widespread rash. Fast  heartbeat. Feeling dizzy or light-headed. You may receive your blood transfusion in an outpatient setting. If so, you will be told whom to contact to report any reactions. These symptoms may be an emergency. Do not wait to see if the symptoms will go away. Get medical help right away. Call your local emergency services (911 in the [place] Do not drive yourself to the hospital. Summary Bruising and soreness at the IV site are common. Check your insertion site every day for signs of infection. Rest as told by your doctor. Go back to your normal activities as told by your doctor. Get help right away if you have signs of a serious reaction. This information is not intended to replace advice given to you by your health care provider. Make sure you discuss any questions you have with your health care provider. Document Revised: 05/25/2020 Document Reviewed: 07/23/2018 Elsevier Patient Education  2022 [employer]  

## 2020-11-23 NOTE — Progress Notes (Signed)
Hematology and Oncology Follow Up Visit  Stephen Lynch [id number] [birth year] 75 y.o. 11/23/2020 11:41 AM [name], [name], [name], MD   Principle Diagnosis: 75 year old man with advanced prostate cancer with disease to the bone diagnosed in February 2021.  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   Prior Therapy:  He status post CT-guided biopsy of the right iliac bone.  [name] started on April 20, 2019.  Xtandi 160 mg daily started in April 2021.  Therapy discontinued in March 2022 due to progression of disease.  Zytiga 1000 mg daily with prednisone 5 mg daily started in March 2022.   Current therapy:    Eligard 30 mg started in April 2021.  Next Eligard will be due in December 2022.  Taxotere chemotherapy 75 mg per metered square started on June 08, 2020.  He is here for cycle 9 of therapy.   Interim History: Stephen Lynch is here for a follow-up visit.  Since the last visit, he reports no major changes in his health.  His pain has increased but remains manageable without the use of fentanyl patches at this time.  He denies any nausea, vomiting or abdominal pain.  He denies any excessive fatigue or tiredness.  He denies any falls or syncope.  He still ambulating without any major difficulties.   Medications: Reviewed without changes. Current Outpatient Medications  Medication Sig Dispense Refill   acetaminophen (TYLENOL) 500 MG tablet Take 500 mg by mouth every 6 (six) hours as needed for mild pain.     ASPIRIN 81 PO Take 81 mg by mouth daily.      Calcium-Magnesium-Zinc 333-133-5 MG TABS Take 1 tablet by mouth daily.     Chromium 1 MG CAPS Take by mouth.     Continuous Blood Gluc Sensor (FREESTYLE LIBRE 2 SENSOR) MISC APPLY EVERY 14 DAYS, CHECK GLUCOSE BEFORE EACH MEAL, 2 HRS AFTER, IN THE MORNING AND BEFORE BEDTIME     doxycycline (VIBRA-TABS) 100 MG tablet      DULoxetine (CYMBALTA) 60 MG capsule Take 60 mg by mouth daily.     fentaNYL (DURAGESIC) 25 MCG/HR  Place 1 patch onto the skin every 3 (three) days. 10 patch 0   insulin lispro (HUMALOG) 100 UNIT/ML KwikPen Junior Inject 0.2 mLs (20 Units total) into the skin 3 (three) times daily after meals. (Patient taking differently: Inject 7-25 Units into the skin 3 (three) times daily after meals. Per sliding scale) 3 mL 6   lidocaine (LIDODERM) 5 % Place 2 patches onto the skin daily. Remove & Discard patch within 12 hours or as directed by MD 30 patch 0   lidocaine-prilocaine (EMLA) cream Apply 1 application topically as needed. 30 g 0   loperamide (IMODIUM) 1 MG/5ML solution Take by mouth as needed for diarrhea or loose stools.     magnesium 30 MG tablet Take 30 mg by mouth 2 (two) times daily.     Menthol, Topical Analgesic, (BIOFREEZE EX) Apply topically.     Menthol-Camphor (TIGER BALM ARTHRITIS RUB EX) Apply 1 application topically 2 (two) times daily as needed (back pain).     ondansetron (ZOFRAN) 4 MG tablet TAKE 1 TABLET BY MOUTH EVERY 6 HOURS AS NEEDED. FOR NAUSEA 20 tablet 1   oxyCODONE-acetaminophen (PERCOCET) 7.5-325 MG tablet Take 1 tablet by mouth 4 (four) times daily as needed. 60 tablet 0   prochlorperazine (COMPAZINE) 10 MG tablet TAKE 1 TABLET BY MOUTH EVERY 6 HOURS AS NEEDED FOR NAUSEA OR VOMITING. 30 tablet 0  rosuvastatin (CRESTOR) 10 MG tablet Take 1 tablet (10 mg total) by mouth daily. 90 tablet 3   TRESIBA FLEXTOUCH 100 UNIT/ML FlexTouch Pen Inject 36 Units into the skin at bedtime. 15 mL 3   No current facility-administered medications for this visit.   Facility-Administered Medications Ordered in Other Visits  Medication Dose Route Frequency Provider Last Rate Last Admin   sodium chloride flush (NS) 0.9 % injection 10 mL  10 mL Intravenous PRN [name], MD   10 mL at 11/23/20 1128     Allergies: No Known Allergies    Physical Exam:       Blood pressure (!) 156/65, pulse 94, temperature 98.7 F (37.1 C), temperature source Oral, resp. rate 17, height 6'  (1.829 m), weight 231 lb 1.6 oz (104.8 kg), SpO2 100 %.      ECOG: 1     General appearance: Comfortable appearing without any discomfort Head: Normocephalic without any trauma Oropharynx: Mucous membranes are moist and pink without any thrush or ulcers. Eyes: Pupils are equal and round reactive to light. Lymph nodes: No cervical, supraclavicular, inguinal or axillary lymphadenopathy.   Heart:regular rate and rhythm.  S1 and S2 without leg edema. Lung: Clear without any rhonchi or wheezes.  No dullness to percussion. Abdomin: Soft, nontender, nondistended with good bowel sounds.  No hepatosplenomegaly. Musculoskeletal: No joint deformity or effusion.  Full range of motion noted. Neurological: No deficits noted on motor, sensory and deep tendon reflex exam. Skin: No petechial rash or dryness.  Appeared moist.                  Lab Results: Lab Results  Component Value Date   WBC 13.6 (H) 11/02/2020   HGB 9.3 (L) 11/02/2020   HCT 28.2 (L) 11/02/2020   MCV 92.2 11/02/2020   PLT 131 (L) 11/02/2020     Chemistry      Component Value Date/Time   NA 131 (L) 11/02/2020 1152   K 4.5 11/02/2020 1152   CL 96 (L) 11/02/2020 1152   CO2 25 11/02/2020 1152   BUN 31 (H) 11/02/2020 1152   CREATININE 1.27 (H) 11/02/2020 1152      Component Value Date/Time   CALCIUM 9.9 11/02/2020 1152   ALKPHOS 1,042 (H) 11/02/2020 1152   AST 56 (H) 11/02/2020 1152   ALT 11 11/02/2020 1152   BILITOT 0.7 11/02/2020 1152          Results for Stephen Lynch, Stephen Lynch (MRN [id number]) as of 11/23/2020 11:42  Ref. Range 11/02/2020 11:52  Prostate Specific Ag, Serum Latest Ref Range: 0.0 - 4.0 ng/mL 205.0 (H)        Impression and Plan:   75 year old man with:  1.   Advanced prostate cancer with disease to the bone diagnosed in February 2022.  He has castration-resistant at this time.   Laboratory data and PET imaging were reviewed from November 14, 2020.  The images showed widespread  metastatic disease at this time predominantly in the bone.  Treatment options moving forward at this time including switching to Jevtana versus Pluvecto.  Risks and benefits of these treatments were reviewed at this time.  Appropriate referral for Pluvecto been already made.  Given the lack of benefit associated with Taxotere chemotherapy I have recommended stopping treatment at this time and proceeding with pluvecto.     2.  Androgen deprivation: This will continue indefinitely with the next injection in December 2022.    3.  Bone directed therapy: He is at  risk of developing pathological bone fractures.  Risks and benefits of Xgeva were reviewed.  Calcium supplements will be added and we will await dental clearance before the start.  4.  Prognosis and goals of care: His disease is incurable although aggressive measures are warranted given his reasonable performance status.   5.  Anemia: Related to malignancy.  His hemoglobin today is down I will arrange for packed red cell transfusion.   6.  Bone pain: Manageable at this time despite the worsening progression of disease.   7.  IV access: Port-A-Cath remains in use for future treatment.   8. Follow-up: will be in the next 3 to 4 weeks to follow his progress.    30  minutes were dedicated to this visit.  The time spent on reviewing laboratory data, disease status update, imaging studies discussion and future plan of care review.  Zola Button, MD 10/13/202211:41 AM

## 2020-11-24 ENCOUNTER — Other Ambulatory Visit: Payer: Self-pay | Admitting: *Deleted

## 2020-11-24 ENCOUNTER — Telehealth: Payer: Self-pay | Admitting: *Deleted

## 2020-11-24 LAB — TYPE AND SCREEN
ABO/RH(D): AB POS
Antibody Screen: NEGATIVE
Unit division: 0
Unit division: 0

## 2020-11-24 LAB — BPAM RBC
Blood Product Expiration Date: 202211062359
Blood Product Expiration Date: 202211062359
ISSUE DATE / TIME: 202210131339
ISSUE DATE / TIME: 202210131516
Unit Type and Rh: 6200
Unit Type and Rh: 6200

## 2020-11-24 LAB — PROSTATE-SPECIFIC AG, SERUM (LABCORP): Prostate Specific Ag, Serum: 155 ng/mL — ABNORMAL HIGH (ref 0.0–4.0)

## 2020-11-24 NOTE — Telephone Encounter (Signed)
PC to patient, informed him his PSA is down to 155, he verbalizes understanding.

## 2020-11-24 NOTE — Patient Outreach (Signed)
[place] [facility]) Care Management  11/24/2020  Stone Spirito 1945-02-13 [id number]   Outgoing call placed to member, state he is "blessed."  Continues treatment for metastatic prostate cancer, PSH levels decreased from 205 in September to 155 yesterday.  Denies any urgent concerns, encouraged to contact this care manager with questions.  Agrees to follow up within the next month.   Goals Addressed             This Visit's Progress    THN - Monitor and Manage My Blood Sugar 3 times a day   Not on track      Timeframe:  Short-Term Goal Priority:  High Start Date:   9/16                  Expected End Date:  12/16 (reset, member not consistent)  Barriers: Health Behaviors                 - check blood sugar at prescribed times - check blood sugar before and after exercise - take the blood sugar log to all doctor visits - take the blood sugar meter to all doctor visits    Why is this important?   Checking your blood sugar at home helps to keep it from getting very high or very low.  Writing the results in a diary or log helps the doctor know how to care for you.  Your blood sugar log should have the time, date and the results.  Also, write down the amount of insulin or other medicine that you take.  Other information, like what you ate, exercise done and how you were feeling, will also be helpful.     Notes:   12/9 - Reminded of diabetic diet and encouraged to continue medication adherence  2/15 - Reviewed medication changes and importance of checking blood sugars to decrease risk of hypoglycemic episodes  4/21 - Girlfriend aware of importance or managing blood sugars in addition to cancer treatments  5/25 - Girlfriend report blood sugars have remained less than 200 but not at 130 with fasting as recommended.  He is on his last day of 28 units, will increase to 30 units tomorrow per order if glucose remains greater then 130  6/23 - Per girlfriend, member  continues to have difficulty managing blood sugars.  Has been as low as 59 and as high as 300's.  Will send more DM education, they will continue to monitor  7/22 - Member made aware to increase Tresiba dose by 2 units every 3 days until fasting glucose is 130 or less.  Blood sugar today was 200, last does increase was on 7/16.  He will continue titration once [name] is refilled  8/17 - Report [name] was filled, prescription dose was updated at pharmacy, now taking 38 units.  State blood sugar yesterday was 90, has not checked today, state he is needing to change his sensor.  Continues Humalog by taking 10 units for small meals and 15 units for larger meals.   9/16 - State blood sugars have slightly increased, today was greater than 200.  Admits that he has not been consistently taking the full dose of Tresiba, only took 20 units yesterday.  Educated about importance of taking correct dose in effort to properly manage diabetes and decrease blood sugars consistently.    10/14 - Member admits that he has not been monitoring blood sugars as he has lost his sensor.  This care manager inquired about his  old meter, state he will find that one and prick finger 3 times a day until he has gotten a replacement sensor.  Educated on importance of monitoring especially since his insulin doses were changed (Oxempic increased to 0.5 mg weekly, Humalog increased to 20-25 units with meals).       THN - Set My Target A1C   On track      Timeframe:  Long-Range Goal Priority:  Medium Start Date:       9/16                Expected End Date:  04/26/2021 (remains out of range, goal reset)  Barriers: Health Behaviors Knowledge                      - set target A1C less than 7   Why is this important?   Your target A1C is decided together by you and your doctor.  It is based on several things like your age and other health issues.    Notes:   12/9 - Encouraged again to contact new endocrinologist to schedule  appointment  2/15 - Call place to PCP office to request A1C  4/21 - Appointment scheduled with Dr. Dwyane Dee for 5/16  5/25 - Reminded member/girlfriend of proper diabetic management.  Encouraged to stop late night/over night snacking.    6/23 - [name] was recommended, but unable to afford.  Referral placed to pharmacy team for suggestions  7/22 - Was seen by Dr. Dwyane Dee on 7/14.  Member still having some issues with adhering to insulin regime.  Reviewed instructions again, now taking 29 unites of [place].  However, state he has run out.  Call placed to [facility], notified that new order would need to be placed as they still have dose as 10 units and insurance will not pay for it until next week.  Call placed to Dr. Ronnie Derby office, request made to have prescription called in for updated dose.  8/24 - follow up with Dr. Dwyane Dee next week.  Request to have list of healthy snacks mailed as his blood sugars have been up due to snacking   9/16 - Member re-educated on titration procedure, has not increased greater than 38 units but not consistent with doses.  Verbalizes understanding of adherence, state he will work harder.  Follow up with Dr. Dwyane Dee on 10/5  10/14 - Was seen by Dr. Dwyane Dee on 10/5, still does not remain consistent with insulin management and CBG monitoring.  Call placed to Diabetes educator Leonia Reader), office visit scheduled for 10/19.       [name], [place], MSN [place] [phone]

## 2020-11-24 NOTE — Telephone Encounter (Signed)
-----   Message from Stephen Portela, MD sent at 11/24/2020  8:57 AM EDT ----- Please let him know his PSA went down

## 2020-11-25 ENCOUNTER — Inpatient Hospital Stay: Payer: Medicare Other

## 2020-11-27 ENCOUNTER — Telehealth: Payer: Self-pay | Admitting: Endocrinology

## 2020-11-27 NOTE — Telephone Encounter (Signed)
MEDICATION: Free Style Libre 2 Transmitter  PHARMACY:  [facility] [address]  HAS THE PATIENT CONTACTED THEIR PHARMACY?  yes  IS THIS A 90 DAY SUPPLY : N/S  IS PATIENT OUT OF MEDICATION: transmitter is broken  IF NOT; HOW MUCH IS LEFT: NA  LAST APPOINTMENT DATE: @10 /06/2020  NEXT APPOINTMENT DATE:@12 /06/2020  DO WE HAVE YOUR PERMISSION TO LEAVE A DETAILED MESSAGE?: yes - [phone]  OTHER COMMENTS:    **Let patient know to contact pharmacy at the end of the day to make sure medication is ready. **  ** Please notify patient to allow 48-72 hours to process**  **Encourage patient to contact the pharmacy for refills or they can request refills through [facility]**

## 2020-11-28 ENCOUNTER — Other Ambulatory Visit: Payer: Self-pay | Admitting: Oncology

## 2020-11-28 NOTE — Telephone Encounter (Signed)
Called pt to get clarification on what was needed. Pt states she was needing a Libre reader but has found his. No longer needs Rx to be sent to pharmacy.

## 2020-11-29 ENCOUNTER — Other Ambulatory Visit: Payer: Self-pay | Admitting: Oncology

## 2020-11-29 ENCOUNTER — Ambulatory Visit: Payer: Medicare Other | Admitting: Nutrition

## 2020-11-29 ENCOUNTER — Telehealth: Payer: Self-pay | Admitting: *Deleted

## 2020-11-29 DIAGNOSIS — C61 Malignant neoplasm of prostate: Secondary | ICD-10-CM

## 2020-11-29 NOTE — Telephone Encounter (Signed)
[facility] informed of palliative care referral, ordered by Dr. Alen Blew.

## 2020-11-29 NOTE — Telephone Encounter (Signed)
PC to patient's S/O [name], informed her of referral for palliative care with [facility].  She verbalizes understanding.

## 2020-12-01 ENCOUNTER — Telehealth: Payer: Self-pay | Admitting: *Deleted

## 2020-12-01 NOTE — Telephone Encounter (Signed)
PC to patient's wife [name], verified that patient is only taking one hydrocodone every 2-4 hours. Per Dr. Alen Blew he may take one hydrocodone every 2 hours, she verbalizes understanding.

## 2020-12-01 NOTE — Telephone Encounter (Signed)
Returned [employer] to [place], patient's wife.  She called & said the patient is taking oxycodone every 4 hours, his fentanyl patch & tylenol every 2 hours & is still having breakthrough pain.  She is asking if he can take his oxycodone every 2 hours.  Per Dr. Alen Blew, he may increase his oxycodone to every two hours, advised [name] that patient needs to be aware of how much tylenol he is taking, it is unsafe for him to take more than 4000 mg every 24 hours. She verbalizes understanding.

## 2020-12-05 ENCOUNTER — Telehealth: Payer: Self-pay

## 2020-12-05 ENCOUNTER — Telehealth: Payer: Self-pay | Admitting: *Deleted

## 2020-12-05 ENCOUNTER — Other Ambulatory Visit: Payer: Self-pay | Admitting: Oncology

## 2020-12-05 MED ORDER — FENTANYL 50 MCG/HR TD PT72: 1.0000 | MEDICATED_PATCH | TRANSDERMAL | 0 refills | Status: DC

## 2020-12-05 MED ORDER — HYDROMORPHONE HCL 4 MG PO TABS: 4.0000 mg | ORAL_TABLET | ORAL | 0 refills | Status: DC | PRN

## 2020-12-05 NOTE — Telephone Encounter (Signed)
Spoke with patient's significant other [name] and scheduled an in-person Palliative Consult for 12/13/20 @ 12:30PM.  COVID screening was negative. One cat in the home. Patient lives with [name] and her mother.  Consent obtained; updated Outlook/Netsmart/Team List and Epic.   Family is aware they may be receiving a call from provider the day before or day of to confirm appointment.

## 2020-12-05 NOTE — Telephone Encounter (Signed)
PC to patient's wife, [name], advised her of message below, two new pain prescription have been sent in for patient.  She verbalizes understanding.

## 2020-12-05 NOTE — Telephone Encounter (Signed)
-----   Message from [name], MD sent at 12/05/2020 11:44 AM EDT ----- He needs to increase Fentany to 50 (he can apply two patches till he runs out). I sent RX for Fentanyl 50 and Dilaudid to use as needed. Thanks ----- Message ----- From: Rolene Course, RN Sent: 12/05/2020  11:16 AM EDT To: [name], MD  Mr Dolby's wife called, he continues to have severe unrelieved pain despite his fentanyl patch & taking percocet every 2 hours.  She says he is unable to walk, & isn't eating.  He has a palliative care appointment on 11/2.  She is asking if there is anything that can be done in the meantime.  Please advise.  Thanks, Bethena Roys

## 2020-12-07 ENCOUNTER — Telehealth: Payer: Self-pay

## 2020-12-07 ENCOUNTER — Emergency Department (HOSPITAL_COMMUNITY): Payer: Medicare Other

## 2020-12-07 ENCOUNTER — Inpatient Hospital Stay (HOSPITAL_COMMUNITY)
Admission: EM | Admit: 2020-12-07 | Discharge: 2020-12-10 | DRG: 689 | Disposition: A | Discharge status: Home Health | Payer: Medicare Other | Attending: Internal Medicine | Admitting: Internal Medicine

## 2020-12-07 ENCOUNTER — Encounter (HOSPITAL_COMMUNITY): Payer: Self-pay | Admitting: Emergency Medicine

## 2020-12-07 DIAGNOSIS — R918 Other nonspecific abnormal finding of lung field: Secondary | ICD-10-CM | POA: Diagnosis not present

## 2020-12-07 DIAGNOSIS — Z7982 Long term (current) use of aspirin: Secondary | ICD-10-CM

## 2020-12-07 DIAGNOSIS — G893 Neoplasm related pain (acute) (chronic): Secondary | ICD-10-CM | POA: Diagnosis present

## 2020-12-07 DIAGNOSIS — E785 Hyperlipidemia, unspecified: Secondary | ICD-10-CM | POA: Diagnosis present

## 2020-12-07 DIAGNOSIS — C7951 Secondary malignant neoplasm of bone: Secondary | ICD-10-CM | POA: Diagnosis present

## 2020-12-07 DIAGNOSIS — N3000 Acute cystitis without hematuria: Secondary | ICD-10-CM

## 2020-12-07 DIAGNOSIS — N390 Urinary tract infection, site not specified: Secondary | ICD-10-CM | POA: Diagnosis not present

## 2020-12-07 DIAGNOSIS — G9341 Metabolic encephalopathy: Secondary | ICD-10-CM | POA: Diagnosis not present

## 2020-12-07 DIAGNOSIS — K5900 Constipation, unspecified: Secondary | ICD-10-CM | POA: Diagnosis present

## 2020-12-07 DIAGNOSIS — D638 Anemia in other chronic diseases classified elsewhere: Secondary | ICD-10-CM | POA: Diagnosis not present

## 2020-12-07 DIAGNOSIS — R29898 Other symptoms and signs involving the musculoskeletal system: Secondary | ICD-10-CM | POA: Diagnosis not present

## 2020-12-07 DIAGNOSIS — Z79899 Other long term (current) drug therapy: Secondary | ICD-10-CM

## 2020-12-07 DIAGNOSIS — E119 Type 2 diabetes mellitus without complications: Secondary | ICD-10-CM | POA: Diagnosis present

## 2020-12-07 DIAGNOSIS — R0902 Hypoxemia: Secondary | ICD-10-CM | POA: Diagnosis not present

## 2020-12-07 DIAGNOSIS — B9620 Unspecified Escherichia coli [E. coli] as the cause of diseases classified elsewhere: Secondary | ICD-10-CM | POA: Diagnosis present

## 2020-12-07 DIAGNOSIS — Z20822 Contact with and (suspected) exposure to covid-19: Secondary | ICD-10-CM | POA: Diagnosis not present

## 2020-12-07 DIAGNOSIS — F0390 Unspecified dementia without behavioral disturbance: Secondary | ICD-10-CM | POA: Diagnosis present

## 2020-12-07 DIAGNOSIS — Z794 Long term (current) use of insulin: Secondary | ICD-10-CM

## 2020-12-07 DIAGNOSIS — R52 Pain, unspecified: Secondary | ICD-10-CM

## 2020-12-07 DIAGNOSIS — I10 Essential (primary) hypertension: Secondary | ICD-10-CM | POA: Diagnosis present

## 2020-12-07 DIAGNOSIS — C61 Malignant neoplasm of prostate: Secondary | ICD-10-CM | POA: Diagnosis not present

## 2020-12-07 DIAGNOSIS — R300 Dysuria: Secondary | ICD-10-CM | POA: Diagnosis not present

## 2020-12-07 DIAGNOSIS — R0689 Other abnormalities of breathing: Secondary | ICD-10-CM | POA: Diagnosis not present

## 2020-12-07 DIAGNOSIS — R4182 Altered mental status, unspecified: Secondary | ICD-10-CM | POA: Diagnosis not present

## 2020-12-07 DIAGNOSIS — R404 Transient alteration of awareness: Secondary | ICD-10-CM | POA: Diagnosis not present

## 2020-12-07 DIAGNOSIS — Z8249 Family history of ischemic heart disease and other diseases of the circulatory system: Secondary | ICD-10-CM

## 2020-12-07 DIAGNOSIS — G9340 Encephalopathy, unspecified: Secondary | ICD-10-CM | POA: Diagnosis not present

## 2020-12-07 DIAGNOSIS — G894 Chronic pain syndrome: Secondary | ICD-10-CM | POA: Diagnosis not present

## 2020-12-07 DIAGNOSIS — Z87891 Personal history of nicotine dependence: Secondary | ICD-10-CM

## 2020-12-07 LAB — CBC WITH DIFFERENTIAL/PLATELET
Abs Immature Granulocytes: 0.16 10*3/uL — ABNORMAL HIGH (ref 0.00–0.07)
Basophils Absolute: 0 10*3/uL (ref 0.0–0.1)
Basophils Relative: 0 %
Eosinophils Absolute: 0 10*3/uL (ref 0.0–0.5)
Eosinophils Relative: 0 %
HCT: 25.1 % — ABNORMAL LOW (ref 39.0–52.0)
Hemoglobin: 7.7 g/dL — ABNORMAL LOW (ref 13.0–17.0)
Immature Granulocytes: 1 %
Lymphocytes Relative: 7 %
Lymphs Abs: 0.9 10*3/uL (ref 0.7–4.0)
MCH: 27.8 pg (ref 26.0–34.0)
MCHC: 30.7 g/dL (ref 30.0–36.0)
MCV: 90.6 fL (ref 80.0–100.0)
Monocytes Absolute: 0.9 10*3/uL (ref 0.1–1.0)
Monocytes Relative: 8 %
Neutro Abs: 9.9 10*3/uL — ABNORMAL HIGH (ref 1.7–7.7)
Neutrophils Relative %: 84 %
Platelets: 482 10*3/uL — ABNORMAL HIGH (ref 150–400)
RBC: 2.77 MIL/uL — ABNORMAL LOW (ref 4.22–5.81)
RDW: 19.7 % — ABNORMAL HIGH (ref 11.5–15.5)
WBC: 11.9 10*3/uL — ABNORMAL HIGH (ref 4.0–10.5)
nRBC: 0.3 % — ABNORMAL HIGH (ref 0.0–0.2)

## 2020-12-07 LAB — COMPREHENSIVE METABOLIC PANEL
ALT: 38 U/L (ref 0–44)
AST: 52 U/L — ABNORMAL HIGH (ref 15–41)
Albumin: 1.9 g/dL — ABNORMAL LOW (ref 3.5–5.0)
Alkaline Phosphatase: 270 U/L — ABNORMAL HIGH (ref 38–126)
Anion gap: 9 (ref 5–15)
BUN: 20 mg/dL (ref 8–23)
CO2: 27 mmol/L (ref 22–32)
Calcium: 8.4 mg/dL — ABNORMAL LOW (ref 8.9–10.3)
Chloride: 98 mmol/L (ref 98–111)
Creatinine, Ser: 0.91 mg/dL (ref 0.61–1.24)
GFR, Estimated: >60 mL/min (ref >60)
Glucose, Bld: 169 mg/dL — ABNORMAL HIGH (ref 70–99)
Potassium: 4.5 mmol/L (ref 3.5–5.1)
Sodium: 134 mmol/L — ABNORMAL LOW (ref 135–145)
Total Bilirubin: 0.6 mg/dL (ref 0.3–1.2)
Total Protein: 7.3 g/dL (ref 6.5–8.1)

## 2020-12-07 LAB — RESP PANEL BY RT-PCR (FLU A&B, COVID) ARPGX2
Influenza A by PCR: NEGATIVE
Influenza B by PCR: NEGATIVE
SARS Coronavirus 2 by RT PCR: NEGATIVE

## 2020-12-07 LAB — URINALYSIS, ROUTINE W REFLEX MICROSCOPIC
Bilirubin Urine: NEGATIVE
Glucose, UA: NEGATIVE mg/dL
Ketones, ur: 5 mg/dL — AB
Nitrite: POSITIVE — AB
Protein, ur: NEGATIVE mg/dL
Specific Gravity, Urine: 1.02 (ref 1.005–1.030)
pH: 5 (ref 5.0–8.0)

## 2020-12-07 LAB — CBG MONITORING, ED: Glucose-Capillary: 164 mg/dL — ABNORMAL HIGH (ref 70–99)

## 2020-12-07 LAB — AMMONIA: Ammonia: 15 umol/L (ref 9–35)

## 2020-12-07 LAB — LACTIC ACID, PLASMA: Lactic Acid, Venous: 0.9 mmol/L (ref 0.5–1.9)

## 2020-12-07 MED ORDER — SODIUM CHLORIDE 0.9 % IV SOLN
1.0000 g | INTRAVENOUS | Status: DC
  Administered 2020-12-08 – 2020-12-09 (×2): 1 g via INTRAVENOUS
  Filled 2020-12-07 (×3): qty 10

## 2020-12-07 MED ORDER — LACTATED RINGERS IV BOLUS
1000.0000 mL | Freq: Once | INTRAVENOUS | Status: AC
  Administered 2020-12-07: 1000 mL via INTRAVENOUS

## 2020-12-07 MED ORDER — SODIUM CHLORIDE 0.9 % IV SOLN
1.0000 g | Freq: Once | INTRAVENOUS | Status: AC
  Administered 2020-12-07: 1 g via INTRAVENOUS
  Filled 2020-12-07: qty 10

## 2020-12-07 MED ORDER — ROSUVASTATIN CALCIUM 10 MG PO TABS
10.0000 mg | ORAL_TABLET | Freq: Every evening | ORAL | Status: DC
  Administered 2020-12-07 – 2020-12-09 (×3): 10 mg via ORAL
  Filled 2020-12-07 (×4): qty 1

## 2020-12-07 MED ORDER — DULOXETINE HCL 30 MG PO CPEP
60.0000 mg | ORAL_CAPSULE | Freq: Every day | ORAL | Status: DC
  Administered 2020-12-08 – 2020-12-10 (×3): 60 mg via ORAL
  Filled 2020-12-07 (×3): qty 2

## 2020-12-07 MED ORDER — ONDANSETRON HCL 4 MG/2ML IJ SOLN: 4.0000 mg | Freq: Four times a day (QID) | INTRAMUSCULAR | Status: DC | PRN

## 2020-12-07 MED ORDER — ACETAMINOPHEN 325 MG PO TABS
650.0000 mg | ORAL_TABLET | Freq: Four times a day (QID) | ORAL | Status: DC | PRN
  Administered 2020-12-08: 650 mg via ORAL
  Filled 2020-12-07: qty 2

## 2020-12-07 MED ORDER — SENNA 8.6 MG PO TABS
1.0000 | ORAL_TABLET | Freq: Two times a day (BID) | ORAL | Status: DC
  Administered 2020-12-07 – 2020-12-10 (×6): 8.6 mg via ORAL
  Filled 2020-12-07 (×6): qty 1

## 2020-12-07 MED ORDER — ACETAMINOPHEN 650 MG RE SUPP: 650.0000 mg | Freq: Four times a day (QID) | RECTAL | Status: DC | PRN

## 2020-12-07 MED ORDER — ENOXAPARIN SODIUM 60 MG/0.6ML IJ SOSY
50.0000 mg | PREFILLED_SYRINGE | INTRAMUSCULAR | Status: DC
  Administered 2020-12-07 – 2020-12-09 (×3): 50 mg via SUBCUTANEOUS
  Filled 2020-12-07: qty 0.6
  Filled 2020-12-07: qty 0.5
  Filled 2020-12-07: qty 0.6

## 2020-12-07 MED ORDER — ACETAMINOPHEN 500 MG PO TABS: 1000.0000 mg | ORAL_TABLET | Freq: Four times a day (QID) | ORAL | Status: DC | PRN

## 2020-12-07 MED ORDER — OXYCODONE-ACETAMINOPHEN 7.5-325 MG PO TABS
1.0000 | ORAL_TABLET | Freq: Four times a day (QID) | ORAL | Status: DC | PRN
  Administered 2020-12-08 – 2020-12-10 (×6): 1 via ORAL
  Filled 2020-12-07 (×6): qty 1

## 2020-12-07 MED ORDER — INSULIN ASPART 100 UNIT/ML IJ SOLN
0.0000 [IU] | Freq: Three times a day (TID) | INTRAMUSCULAR | Status: DC
  Administered 2020-12-08: 3 [IU] via SUBCUTANEOUS
  Administered 2020-12-08 (×2): 2 [IU] via SUBCUTANEOUS
  Administered 2020-12-09 (×3): 3 [IU] via SUBCUTANEOUS
  Administered 2020-12-10 (×2): 2 [IU] via SUBCUTANEOUS
  Filled 2020-12-07: qty 0.09

## 2020-12-07 MED ORDER — SORBITOL 70 % SOLN
30.0000 mL | Freq: Every day | Status: DC | PRN
  Filled 2020-12-07: qty 30

## 2020-12-07 MED ORDER — POLYETHYLENE GLYCOL 3350 17 G PO PACK: 17.0000 g | PACK | Freq: Every day | ORAL | Status: DC | PRN

## 2020-12-07 MED ORDER — ONDANSETRON HCL 4 MG PO TABS: 4.0000 mg | ORAL_TABLET | Freq: Four times a day (QID) | ORAL | Status: DC | PRN

## 2020-12-07 MED ORDER — FENTANYL 50 MCG/HR TD PT72
1.0000 | MEDICATED_PATCH | TRANSDERMAL | Status: DC
  Administered 2020-12-09: 1 via TRANSDERMAL
  Filled 2020-12-07 (×2): qty 1

## 2020-12-07 MED ORDER — ACETAMINOPHEN 500 MG PO TABS
1000.0000 mg | ORAL_TABLET | Freq: Once | ORAL | Status: AC
  Administered 2020-12-07: 1000 mg via ORAL
  Filled 2020-12-07: qty 2

## 2020-12-07 MED ORDER — INSULIN GLARGINE-YFGN 100 UNIT/ML ~~LOC~~ SOLN
20.0000 [IU] | Freq: Every day | SUBCUTANEOUS | Status: DC
  Administered 2020-12-07: 20 [IU] via SUBCUTANEOUS
  Filled 2020-12-07 (×2): qty 0.2

## 2020-12-07 MED ORDER — INSULIN DEGLUDEC 100 UNIT/ML ~~LOC~~ SOPN: 20.0000 [IU] | PEN_INJECTOR | Freq: Every day | SUBCUTANEOUS | Status: DC

## 2020-12-07 MED ORDER — HYDROMORPHONE HCL 1 MG/ML IJ SOLN
1.0000 mg | Freq: Once | INTRAMUSCULAR | Status: AC
  Administered 2020-12-07: 1 mg via INTRAVENOUS
  Filled 2020-12-07: qty 1

## 2020-12-07 NOTE — ED Provider Notes (Signed)
[facility] DEPT Provider Note   CSN: [id number] Arrival date & time: 12/07/20  1734     History Chief Complaint  Patient presents with   Dysuria    Stephen Lynch is a 75 y.o. male.  Pt presents to the ED today with AMS.  Pt has a hx of metastatic prostate cancer.  He is no longer on chemo and he is scheduled to see palliative care on 11/2.  Pt has had severe pain and has been on fentanyl and dilaudid for pain.  Per EMS, pt has been confused and has had dysuria.  Pt denies any pain right now.  He does not recall a fever.  However, his wife left a message with oncology saying he's had a fever.      Past Medical History:  Diagnosis Date   Cancer [facility])    prostate   Diabetes mellitus without complication ([place])    Dyslipidemia    Hypertension     Patient Active Problem List   Diagnosis Date Noted   Complicated UTI (urinary tract infection) 12/07/2020   Goals of care, counseling/discussion 05/26/2020   Prostate cancer ([place]) 04/13/2019   Back pain 03/23/2019   Symptomatic anemia 03/23/2019   COVID-19 virus infection 03/23/2019   Dilation of biliary tract 03/23/2019   COPD (chronic obstructive pulmonary disease) ([place]) 03/23/2019   Hypertension    Dyslipidemia    Diabetes mellitus without complication ([place])     Past Surgical History:  Procedure Laterality Date   EYE SURGERY     IR IMAGING GUIDED PORT INSERTION  06/12/2020       Family History  Problem Relation Age of Onset   CAD Mother 74   Other Father 30       age   Other Brother 10       unknown cause    Social History   Tobacco Use   Smoking status: Former    Packs/day: 0.50    Years: 15.00    Pack years: 7.50    Types: Cigarettes   Smokeless tobacco: Never  Substance Use Topics   Alcohol use: Not Currently    Comment: h/o heavy use, quit about 2010   Drug use: Not Currently    Comment: h/o use with cocaine, marijuana, quit about 2010    Home Medications Prior to  Admission medications   Medication Sig Start Date End Date Taking? Authorizing Provider  acetaminophen (TYLENOL) 500 MG tablet Take 500 mg by mouth every 6 (six) hours as needed for mild pain.    [provider]  ASPIRIN 81 PO Take 81 mg by mouth daily.     [provider]  Calcium-Magnesium-Zinc [phone] MG TABS Take 1 tablet by mouth daily.    [provider]  Chromium 1 MG CAPS Take by mouth.    [provider]  Continuous Blood Gluc Sensor (FREESTYLE LIBRE 2 SENSOR) MISC APPLY EVERY 14 DAYS, CHECK GLUCOSE BEFORE EACH MEAL, 2 HRS AFTER, IN THE MORNING AND BEFORE BEDTIME 06/08/20   [provider]  doxycycline (VIBRA-TABS) 100 MG tablet  04/12/19   [provider]  DULoxetine (CYMBALTA) 60 MG capsule Take 60 mg by mouth daily. 03/20/19   [provider]  fentaNYL (DURAGESIC) 50 MCG/HR Place 1 patch onto the skin every 3 (three) days. 12/05/20   [name], MD  HYDROmorphone (DILAUDID) 4 MG tablet Take 1 tablet (4 mg total) by mouth every 4 (four) hours as needed for severe pain. 12/05/20  [name], MD  insulin lispro (HUMALOG) 100 UNIT/ML KwikPen Junior Inject 0.2 mLs (20 Units total) into the skin 3 (three) times daily after meals. Patient taking differently: Inject 7-25 Units into the skin 3 (three) times daily after meals. Per sliding scale 09/25/16   [name], MD  lidocaine (LIDODERM) 5 % Place 2 patches onto the skin daily. Remove & Discard patch within 12 hours or as directed by MD 03/26/19   Flora Lipps, MD  lidocaine-prilocaine (EMLA) cream Apply 1 application topically as needed. 08/31/20   [name], MD  loperamide (IMODIUM) 1 MG/5ML solution Take by mouth as needed for diarrhea or loose stools.    [provider]  magnesium 30 MG tablet Take 30 mg by mouth 2 (two) times daily.    [provider]  Menthol, Topical Analgesic, (BIOFREEZE EX) Apply topically.    [provider]   Menthol-Camphor (TIGER BALM ARTHRITIS RUB EX) Apply 1 application topically 2 (two) times daily as needed (back pain).    [provider]  ondansetron (ZOFRAN) 4 MG tablet TAKE 1 TABLET BY MOUTH EVERY 6 HOURS AS NEEDED. FOR NAUSEA 11/16/20   [name], MD  oxyCODONE-acetaminophen (PERCOCET) 7.5-325 MG tablet Take 1 tablet by mouth 4 (four) times daily as needed. 11/22/20   [name], MD  prochlorperazine (COMPAZINE) 10 MG tablet TAKE 1 TABLET BY MOUTH EVERY 6 HOURS AS NEEDED FOR NAUSEA OR VOMITING. 08/22/20   [name], MD  rosuvastatin (CRESTOR) 10 MG tablet Take 1 tablet (10 mg total) by mouth daily. 09/25/16   [name], MD  TRESIBA FLEXTOUCH 100 UNIT/ML FlexTouch Pen Inject 36 Units into the skin at bedtime. 09/04/20   [name], MD    Allergies    Patient has no known allergies.  Review of Systems   Review of Systems  Constitutional:  Positive for fever.  Genitourinary:  Positive for dysuria.  Neurological:  Positive for weakness.  All other systems reviewed and are negative.  Physical Exam Updated Vital Signs BP (!) 141/62   Pulse 92   Temp (!) 100.7 F (38.2 C) (Rectal)   Resp 16   Ht 6' (1.829 m)   Wt 105 kg   SpO2 97%   BMI 31.39 kg/m   Physical Exam Vitals and nursing note reviewed.  Constitutional:      Appearance: Normal appearance. He is ill-appearing.  HENT:     Head: Normocephalic and atraumatic.     Right Ear: External ear normal.     Left Ear: External ear normal.     Nose: Nose normal.     Mouth/Throat:     Mouth: Mucous membranes are dry.  Eyes:     Extraocular Movements: Extraocular movements intact.     Conjunctiva/sclera: Conjunctivae normal.     Pupils: Pupils are equal, round, and reactive to light.  Cardiovascular:     Rate and Rhythm: Regular rhythm. Tachycardia present.     Pulses: Normal pulses.     Heart sounds: Normal heart sounds.  Pulmonary:     Effort: Pulmonary effort is normal.     Breath sounds:  Normal breath sounds.  Abdominal:     General: Abdomen is flat. Bowel sounds are normal.     Palpations: Abdomen is soft.  Musculoskeletal:        General: Normal range of motion.     Cervical back: Normal range of motion.  Skin:    General: Skin is warm.  Capillary Refill: Capillary refill takes less than 2 seconds.  Neurological:     General: No focal deficit present.     Mental Status: He is alert. He is disoriented.  Psychiatric:        Mood and Affect: Mood normal.        Behavior: Behavior normal.    ED Results / Procedures / Treatments   Labs (all labs ordered are listed, but only abnormal results are displayed) Labs Reviewed  CBC WITH DIFFERENTIAL/PLATELET - Abnormal; Notable for the following components:      Result Value   WBC 11.9 (*)    RBC 2.77 (*)    Hemoglobin 7.7 (*)    HCT 25.1 (*)    RDW 19.7 (*)    Platelets 482 (*)    nRBC 0.3 (*)    Neutro Abs 9.9 (*)    Abs Immature Granulocytes 0.16 (*)    All other components within normal limits  COMPREHENSIVE METABOLIC PANEL - Abnormal; Notable for the following components:   Sodium 134 (*)    Glucose, Bld 169 (*)    Calcium 8.4 (*)    Albumin 1.9 (*)    AST 52 (*)    Alkaline Phosphatase 270 (*)    All other components within normal limits  URINALYSIS, ROUTINE W REFLEX MICROSCOPIC - Abnormal; Notable for the following components:   APPearance HAZY (*)    Hgb urine dipstick MODERATE (*)    Ketones, ur 5 (*)    Nitrite POSITIVE (*)    Leukocytes,Ua MODERATE (*)    Bacteria, UA MANY (*)    All other components within normal limits  CBG MONITORING, ED - Abnormal; Notable for the following components:   Glucose-Capillary 164 (*)    All other components within normal limits  RESP PANEL BY RT-PCR (FLU A&B, COVID) ARPGX2  URINE CULTURE  CULTURE, BLOOD (ROUTINE X 2)  CULTURE, BLOOD (ROUTINE X 2)  AMMONIA  LACTIC ACID, PLASMA    EKG EKG Interpretation  Date/Time:  Thursday December 07 2020 18:11:44  EDT Ventricular Rate:  100 PR Interval:  144 QRS Duration: 79 QT Interval:  319 QTC Calculation: 412 R Axis:   60 Text Interpretation: Sinus tachycardia Abnormal R-wave progression, early transition Nonspecific T abnormalities, lateral leads Since last tracing rate faster Confirmed by Isla Pence [phone]) on 12/07/2020 6:20:22 PM  Radiology CT Head Wo Contrast  Result Date: 12/07/2020 CLINICAL DATA:  Altered mental status. EXAM: CT HEAD WITHOUT CONTRAST TECHNIQUE: Contiguous axial images were obtained from the base of the skull through the vertex without intravenous contrast. COMPARISON:  None. FINDINGS: Brain: The ventricles and sulci appropriate size for patient's age. The gray-white matter discrimination is preserved. There is no acute intracranial hemorrhage. No mass effect midline shift. No extra-axial fluid collection. Vascular: No hyperdense vessel or unexpected calcification. Skull: Normal. Negative for fracture or focal lesion. Sinuses/Orbits: No acute finding. Other: None IMPRESSION: Unremarkable noncontrast CT of the brain. Electronically Signed   By: Anner Crete M.D.   On: 12/07/2020 19:21   DG Chest Portable 1 View  Result Date: 12/07/2020 CLINICAL DATA:  aloc EXAM: PORTABLE CHEST 1 VIEW COMPARISON:  PET CT November 14, 2020. FINDINGS: Lordotic positioning. Poorly defined right basilar opacities. No visible pleural effusions or pneumothorax. Cardiomediastinal silhouette is accentuated by AP technique. Right IJ Port-A-Cath the tip projecting at the lower SVC. IMPRESSION: Poorly defined right basilar opacities, possibly atelectasis. Infection is not excluded. Dedicated PA and lateral radiographs could better characterize if indicated.  Electronically Signed   By: Margaretha Sheffield M.D.   On: 12/07/2020 18:32    Procedures Procedures   Medications Ordered in ED Medications  cefTRIAXone (ROCEPHIN) 1 g in sodium chloride 0.9 % 100 mL IVPB (1 g Intravenous New Bag/Given 12/07/20  2035)  acetaminophen (TYLENOL) tablet 1,000 mg (1,000 mg Oral Given 12/07/20 1938)  lactated ringers bolus 1,000 mL (0 mLs Intravenous Stopped 12/07/20 2048)  HYDROmorphone (DILAUDID) injection 1 mg (1 mg Intravenous Given 12/07/20 2030)    ED Course  I have reviewed the triage vital signs and the nursing notes.  Pertinent labs & imaging results that were available during my care of the patient were reviewed by me and considered in my medical decision making (see chart for details).    MDM Rules/Calculators/A&P                           Pt's wife said his mental status has been off.  He has also been in such pain that he's not been able to get up and move.  Pt does have a fever.  This is from a UTI.  He does not appear to be septic; however.  He is given 1g of rocephin for his UTI.  Pain returned while here and he was given dilaudid for pain.  Pt d/w Dr. Posey Pronto ([facility]) for admission.  Final Clinical Impression(s) / ED Diagnoses Final diagnoses:  Metabolic encephalopathy  Acute cystitis without hematuria  Intractable pain  Prostate cancer metastatic to bone [facility])    Rx / DC Orders ED Discharge Orders     None        Isla Pence, MD 12/07/20 2103

## 2020-12-07 NOTE — ED Triage Notes (Signed)
Pt BIB EMS from home c/o painful urination  and urinary frequency x 2 -3 days. Pcp wants pt evaluated for uti. hx of pancreatic cancer. a&o x 2 (baseline)

## 2020-12-07 NOTE — Telephone Encounter (Signed)
Pts wife, [name], called stating pt is constipated despite him taking Miralax for three days. She states he "is out of it", not eating and not drinking. His urine is brown and does not smell normal. She denies that he has a fever.  Notified Dr. Alen Blew of this and he recommends pt go to the ER for evaluation.  I have called pts wife back and advised as indicated. She expressed understanding of this information.

## 2020-12-07 NOTE — H&P (Signed)
History and Physical    Stephen Lynch XKG:[id number] DOB: Apr 23, 1945 DOA: 12/07/2020  PCP: [name], MD  Patient coming from: Home via EMS  I have personally briefly reviewed patient's old medical records in [place]  Chief Complaint: Dysuria, altered mental status  HPI: Stephen Lynch is a 75 y.o. male with medical history significant for advanced pancreatic cancer with bone metastases, insulin-dependent diabetes, HTN, HLD, anemia of chronic disease who presented to the ED for evaluation of dysuria and altered mental status.  History is supplemented by patient's significant other at bedside.  Patient reports about 2 days ago burning and pain with urination.  He reports decreased urine output than expected.  He has had constipation and reports last bowel movement several days ago.  He is passing flatus.  He has been feeling confused which is significant other also states that confusion is new compared to his baseline.  He has had nausea and low appetite but no vomiting.  He denies any chest pain, dyspnea, abdominal pain, diarrhea.  ED Course:  Initial vitals showed BP 143/69, pulse 99, RR 18, temp 100.7 F, SPO2 97% on room air.  Labs show WBC 11.9, hemoglobin 7.7, platelets 482,000, sodium 134, potassium 4.5, bicarb 27, BUN 20, creatinine 0.91, serum glucose 169, albumin 1.9, AST 52, ALT 38, alk phos 270, total bilirubin 0.6, ammonia 15, lactic acid 0.9.  Urinalysis shows positive nitrates, moderate leukocytes, 0-5 RBC/hpf, 21-50 WBC/hpf, many bacteria on microscopy.  Blood and urine cultures obtained and pending.  SARS-CoV-2 and influenza PCR's are negative.  Portable chest x-ray shows poorly defined right basilar opacity.  Right IJ Port-A-Cath in place.  CT head without contrast is unremarkable.  Patient was given 1 L LR, IV ceftriaxone, oral Tylenol, and 1 mg IV Dilaudid.  The hospitalist service was consulted to admit for further evaluation and management.  Review of  Systems: All systems reviewed and are negative except as documented in history of present illness above.   Past Medical History:  Diagnosis Date   Cancer [facility])    prostate   Diabetes mellitus without complication ([place])    Dyslipidemia    Hypertension     Past Surgical History:  Procedure Laterality Date   EYE SURGERY     IR IMAGING GUIDED PORT INSERTION  06/12/2020    Social History:  reports that he has quit smoking. His smoking use included cigarettes. He has a 7.50 pack-year smoking history. He has never used smokeless tobacco. He reports that he does not currently use alcohol. He reports that he does not currently use drugs.  No Known Allergies  Family History  Problem Relation Age of Onset   CAD Mother 73   Other Father 66       age   Other Brother 56       unknown cause     Prior to Admission medications   Medication Sig Start Date End Date Taking? Authorizing Provider  acetaminophen (TYLENOL) 500 MG tablet Take 500 mg by mouth every 6 (six) hours as needed for mild pain.    [provider]  ASPIRIN 81 PO Take 81 mg by mouth daily.     [provider]  Calcium-Magnesium-Zinc [phone] MG TABS Take 1 tablet by mouth daily.    [provider]  Chromium 1 MG CAPS Take by mouth.    [provider]  Continuous Blood Gluc Sensor (FREESTYLE LIBRE 2 SENSOR) MISC APPLY EVERY 14 DAYS, CHECK GLUCOSE BEFORE EACH MEAL, 2 HRS  AFTER, IN THE MORNING AND BEFORE BEDTIME 06/08/20   [provider]  doxycycline (VIBRA-TABS) 100 MG tablet  04/12/19   [provider]  DULoxetine (CYMBALTA) 60 MG capsule Take 60 mg by mouth daily. 03/20/19   [provider]  fentaNYL (DURAGESIC) 50 MCG/HR Place 1 patch onto the skin every 3 (three) days. 12/05/20   [name], MD  HYDROmorphone (DILAUDID) 4 MG tablet Take 1 tablet (4 mg total) by mouth every 4 (four) hours as needed for severe pain. 12/05/20   [name], MD  insulin lispro  (HUMALOG) 100 UNIT/ML KwikPen Junior Inject 0.2 mLs (20 Units total) into the skin 3 (three) times daily after meals. Patient taking differently: Inject 7-25 Units into the skin 3 (three) times daily after meals. Per sliding scale 09/25/16   [name], MD  lidocaine (LIDODERM) 5 % Place 2 patches onto the skin daily. Remove & Discard patch within 12 hours or as directed by MD 03/26/19   Flora Lipps, MD  lidocaine-prilocaine (EMLA) cream Apply 1 application topically as needed. 08/31/20   [name], MD  loperamide (IMODIUM) 1 MG/5ML solution Take by mouth as needed for diarrhea or loose stools.    [provider]  magnesium 30 MG tablet Take 30 mg by mouth 2 (two) times daily.    [provider]  Menthol, Topical Analgesic, (BIOFREEZE EX) Apply topically.    [provider]  Menthol-Camphor (TIGER BALM ARTHRITIS RUB EX) Apply 1 application topically 2 (two) times daily as needed (back pain).    [provider]  ondansetron (ZOFRAN) 4 MG tablet TAKE 1 TABLET BY MOUTH EVERY 6 HOURS AS NEEDED. FOR NAUSEA 11/16/20   [name], MD  oxyCODONE-acetaminophen (PERCOCET) 7.5-325 MG tablet Take 1 tablet by mouth 4 (four) times daily as needed. 11/22/20   [name], MD  prochlorperazine (COMPAZINE) 10 MG tablet TAKE 1 TABLET BY MOUTH EVERY 6 HOURS AS NEEDED FOR NAUSEA OR VOMITING. 08/22/20   [name], MD  rosuvastatin (CRESTOR) 10 MG tablet Take 1 tablet (10 mg total) by mouth daily. 09/25/16   [name], MD  TRESIBA FLEXTOUCH 100 UNIT/ML FlexTouch Pen Inject 36 Units into the skin at bedtime. 09/04/20   [name], MD    Physical Exam: Vitals:   12/07/20 1900 12/07/20 2000 12/07/20 2045 12/07/20 2100  BP: (!) 143/69 135/78 (!) 145/94 (!) 141/62  Pulse: 98 95 92 92  Resp: 18 20 16 16   Temp:      TempSrc:      SpO2: 97% 97% 96% 97%  Weight:      Height:       Constitutional: Resting supine in bed, NAD, calm, comfortable Eyes:  PERRL, lids and conjunctivae normal ENMT: Mucous membranes are dry. Posterior pharynx clear of any exudate or lesions.Normal dentition.  Neck: normal, supple, no masses. Respiratory: clear to auscultation bilaterally, no wheezing, no crackles. Normal respiratory effort. No accessory muscle use.  Cardiovascular: Regular rate and rhythm, no murmurs / rubs / gallops. No extremity edema. 2+ pedal pulses. Abdomen: no tenderness, no masses palpated. No hepatosplenomegaly. Bowel sounds positive.  Musculoskeletal: no clubbing / cyanosis. No joint deformity upper and lower extremities. Good ROM, no contractures. Normal muscle tone.  Skin: no rashes, lesions, ulcers. No induration Neurologic: CN 2-12 grossly intact. Sensation intact. Strength 5/5 in all 4.  Psychiatric: Alert and oriented to self and place but not year or situation.  Labs on Admission: I have personally  reviewed following labs and imaging studies  CBC: Recent Labs  Lab 12/07/20 1812  WBC 11.9*  NEUTROABS 9.9*  HGB 7.7*  HCT 25.1*  MCV 90.6  PLT 373*   Basic Metabolic Panel: Recent Labs  Lab 12/07/20 1812  NA 134*  K 4.5  CL 98  CO2 27  GLUCOSE 169*  BUN 20  CREATININE 0.91  CALCIUM 8.4*   GFR: Estimated Creatinine Clearance: 90.6 mL/min (by C-G formula based on SCr of 0.91 mg/dL). Liver Function Tests: Recent Labs  Lab 12/07/20 1812  AST 52*  ALT 38  ALKPHOS 270*  BILITOT 0.6  PROT 7.3  ALBUMIN 1.9*   No results for input(s): LIPASE, AMYLASE in the last 168 hours. Recent Labs  Lab 12/07/20 1812  AMMONIA 15   Coagulation Profile: No results for input(s): INR, PROTIME in the last 168 hours. Cardiac Enzymes: No results for input(s): CKTOTAL, CKMB, CKMBINDEX, TROPONINI in the last 168 hours. BNP (last 3 results) No results for input(s): PROBNP in the last 8760 hours. HbA1C: No results for input(s): HGBA1C in the last 72 hours. CBG: Recent Labs  Lab 12/07/20 1830  GLUCAP 164*   Lipid  Profile: No results for input(s): CHOL, HDL, LDLCALC, TRIG, CHOLHDL, LDLDIRECT in the last 72 hours. Thyroid Function Tests: No results for input(s): TSH, T4TOTAL, FREET4, T3FREE, THYROIDAB in the last 72 hours. Anemia Panel: No results for input(s): VITAMINB12, FOLATE, FERRITIN, TIBC, IRON, RETICCTPCT in the last 72 hours. Urine analysis:    Component Value Date/Time   COLORURINE YELLOW 12/07/2020 1935   APPEARANCEUR HAZY (A) 12/07/2020 1935   LABSPEC 1.020 12/07/2020 1935   PHURINE 5.0 12/07/2020 1935   GLUCOSEU NEGATIVE 12/07/2020 1935   HGBUR MODERATE (A) 12/07/2020 1935   BILIRUBINUR NEGATIVE 12/07/2020 1935   KETONESUR 5 (A) 12/07/2020 1935   PROTEINUR NEGATIVE 12/07/2020 1935   NITRITE POSITIVE (A) 12/07/2020 1935   LEUKOCYTESUR MODERATE (A) 12/07/2020 1935    Radiological Exams on Admission: CT Head Wo Contrast  Result Date: 12/07/2020 CLINICAL DATA:  Altered mental status. EXAM: CT HEAD WITHOUT CONTRAST TECHNIQUE: Contiguous axial images were obtained from the base of the skull through the vertex without intravenous contrast. COMPARISON:  None. FINDINGS: Brain: The ventricles and sulci appropriate size for patient's age. The gray-white matter discrimination is preserved. There is no acute intracranial hemorrhage. No mass effect midline shift. No extra-axial fluid collection. Vascular: No hyperdense vessel or unexpected calcification. Skull: Normal. Negative for fracture or focal lesion. Sinuses/Orbits: No acute finding. Other: None IMPRESSION: Unremarkable noncontrast CT of the brain. Electronically Signed   By: Anner Crete M.D.   On: 12/07/2020 19:21   DG Chest Portable 1 View  Result Date: 12/07/2020 CLINICAL DATA:  aloc EXAM: PORTABLE CHEST 1 VIEW COMPARISON:  PET CT November 14, 2020. FINDINGS: Lordotic positioning. Poorly defined right basilar opacities. No visible pleural effusions or pneumothorax. Cardiomediastinal silhouette is accentuated by AP technique. Right IJ  Port-A-Cath the tip projecting at the lower SVC. IMPRESSION: Poorly defined right basilar opacities, possibly atelectasis. Infection is not excluded. Dedicated PA and lateral radiographs could better characterize if indicated. Electronically Signed   By: Margaretha Sheffield M.D.   On: 12/07/2020 18:32    EKG: Personally reviewed. Normal sinus rhythm, rate 100, no acute ischemic changes.  Rate is faster when compared to prior.  Assessment/Plan Principal Problem:   Complicated UTI (urinary tract infection) Active Problems:   Dyslipidemia   Diabetes mellitus without complication ([facility])   Prostate cancer ([place])  Acute metabolic encephalopathy   Stephen Lynch is a 75 y.o. male with medical history significant for advanced pancreatic cancer with bone metastases, insulin-dependent diabetes, HTN, HLD, anemia of chronic disease who is admitted with acute metabolic encephalopathy due to UTI.  Acute complicated UTI: -Continue empiric IV ceftriaxone -Follow urine and blood cultures  Acute metabolic encephalopathy: Likely secondary to acute UTI.  Continue management as above.  Advanced prostate cancer with metastases to bone: Follows with oncology, Dr. Alen Blew.  He was referred to palliative care and has an appointment scheduled for 11/2.  Insulin-dependent diabetes: Place on reduced home insulin degludec and add sensitive SSI.  Anemia of chronic disease: Relatively stable without obvious bleeding.  Continue to monitor.  Hyperlipidemia: Continue rosuvastatin.  Chronic cancer associated pain: Continue fentanyl patch and Percocet as needed.  Add bowel regimen.  DVT prophylaxis: Lovenox Code Status: Full code Family Communication: Discussed with patient's significant other at bedside Disposition Plan: From home, dispo pending clinical progress Consults called: None Level of care: Med-Surg Admission status:  Status is: Observation  The patient remains OBS appropriate and will d/c before 2  midnights.  Zada Finders MD [facility]  If 7PM-7AM, please contact night-coverage [email or web address]  12/07/2020, 10:38 PM

## 2020-12-07 NOTE — ED Notes (Signed)
Stephen Lynch [facility] ) responded to the consult. The patients request will be taken care of first thing in the morning due to needing 2 witnesses as well as a notary for the patients request.

## 2020-12-08 ENCOUNTER — Other Ambulatory Visit: Payer: Self-pay

## 2020-12-08 DIAGNOSIS — Z20822 Contact with and (suspected) exposure to covid-19: Secondary | ICD-10-CM | POA: Diagnosis present

## 2020-12-08 DIAGNOSIS — Z7982 Long term (current) use of aspirin: Secondary | ICD-10-CM | POA: Diagnosis not present

## 2020-12-08 DIAGNOSIS — Z87891 Personal history of nicotine dependence: Secondary | ICD-10-CM | POA: Diagnosis not present

## 2020-12-08 DIAGNOSIS — K5900 Constipation, unspecified: Secondary | ICD-10-CM | POA: Diagnosis present

## 2020-12-08 DIAGNOSIS — N390 Urinary tract infection, site not specified: Secondary | ICD-10-CM | POA: Diagnosis present

## 2020-12-08 DIAGNOSIS — B9620 Unspecified Escherichia coli [E. coli] as the cause of diseases classified elsewhere: Secondary | ICD-10-CM | POA: Diagnosis present

## 2020-12-08 DIAGNOSIS — C7951 Secondary malignant neoplasm of bone: Secondary | ICD-10-CM | POA: Diagnosis present

## 2020-12-08 DIAGNOSIS — D638 Anemia in other chronic diseases classified elsewhere: Secondary | ICD-10-CM | POA: Diagnosis present

## 2020-12-08 DIAGNOSIS — G893 Neoplasm related pain (acute) (chronic): Secondary | ICD-10-CM | POA: Diagnosis present

## 2020-12-08 DIAGNOSIS — C61 Malignant neoplasm of prostate: Secondary | ICD-10-CM | POA: Diagnosis present

## 2020-12-08 DIAGNOSIS — I10 Essential (primary) hypertension: Secondary | ICD-10-CM | POA: Diagnosis present

## 2020-12-08 DIAGNOSIS — G9341 Metabolic encephalopathy: Secondary | ICD-10-CM | POA: Diagnosis present

## 2020-12-08 DIAGNOSIS — E119 Type 2 diabetes mellitus without complications: Secondary | ICD-10-CM | POA: Diagnosis present

## 2020-12-08 DIAGNOSIS — E785 Hyperlipidemia, unspecified: Secondary | ICD-10-CM | POA: Diagnosis present

## 2020-12-08 DIAGNOSIS — Z794 Long term (current) use of insulin: Secondary | ICD-10-CM | POA: Diagnosis not present

## 2020-12-08 DIAGNOSIS — F0390 Unspecified dementia without behavioral disturbance: Secondary | ICD-10-CM | POA: Diagnosis present

## 2020-12-08 DIAGNOSIS — R4182 Altered mental status, unspecified: Secondary | ICD-10-CM | POA: Diagnosis not present

## 2020-12-08 DIAGNOSIS — Z8249 Family history of ischemic heart disease and other diseases of the circulatory system: Secondary | ICD-10-CM | POA: Diagnosis not present

## 2020-12-08 DIAGNOSIS — Z79899 Other long term (current) drug therapy: Secondary | ICD-10-CM | POA: Diagnosis not present

## 2020-12-08 LAB — CBC
HCT: 26.8 % — ABNORMAL LOW (ref 39.0–52.0)
Hemoglobin: 8.2 g/dL — ABNORMAL LOW (ref 13.0–17.0)
MCH: 28.1 pg (ref 26.0–34.0)
MCHC: 30.6 g/dL (ref 30.0–36.0)
MCV: 91.8 fL (ref 80.0–100.0)
Platelets: 470 10*3/uL — ABNORMAL HIGH (ref 150–400)
RBC: 2.92 MIL/uL — ABNORMAL LOW (ref 4.22–5.81)
RDW: 19.8 % — ABNORMAL HIGH (ref 11.5–15.5)
WBC: 10.8 10*3/uL — ABNORMAL HIGH (ref 4.0–10.5)
nRBC: 0.2 % (ref 0.0–0.2)

## 2020-12-08 LAB — IRON AND TIBC
Iron: 35 ug/dL — ABNORMAL LOW (ref 45–182)
Saturation Ratios: 34 % (ref 17.9–39.5)
TIBC: 104 ug/dL — ABNORMAL LOW (ref 250–450)
UIBC: 69 ug/dL

## 2020-12-08 LAB — BASIC METABOLIC PANEL
Anion gap: 9 (ref 5–15)
BUN: 19 mg/dL (ref 8–23)
CO2: 28 mmol/L (ref 22–32)
Calcium: 8.6 mg/dL — ABNORMAL LOW (ref 8.9–10.3)
Chloride: 97 mmol/L — ABNORMAL LOW (ref 98–111)
Creatinine, Ser: 0.77 mg/dL (ref 0.61–1.24)
GFR, Estimated: >60 mL/min (ref >60)
Glucose, Bld: 179 mg/dL — ABNORMAL HIGH (ref 70–99)
Potassium: 4.4 mmol/L (ref 3.5–5.1)
Sodium: 134 mmol/L — ABNORMAL LOW (ref 135–145)

## 2020-12-08 LAB — CBG MONITORING, ED
Glucose-Capillary: 159 mg/dL — ABNORMAL HIGH (ref 70–99)
Glucose-Capillary: 179 mg/dL — ABNORMAL HIGH (ref 70–99)

## 2020-12-08 LAB — GLUCOSE, CAPILLARY
Glucose-Capillary: 207 mg/dL — ABNORMAL HIGH (ref 70–99)
Glucose-Capillary: 244 mg/dL — ABNORMAL HIGH (ref 70–99)

## 2020-12-08 LAB — FERRITIN: Ferritin: 6144 ng/mL — ABNORMAL HIGH (ref 24–336)

## 2020-12-08 LAB — VITAMIN B12: Vitamin B-12: 2553 pg/mL — ABNORMAL HIGH (ref 180–914)

## 2020-12-08 LAB — FOLATE: Folate: 11.9 ng/mL (ref >5.9)

## 2020-12-08 MED ORDER — INSULIN GLARGINE-YFGN 100 UNIT/ML ~~LOC~~ SOLN
20.0000 [IU] | Freq: Every day | SUBCUTANEOUS | Status: DC
  Administered 2020-12-08 – 2020-12-09 (×2): 20 [IU] via SUBCUTANEOUS
  Filled 2020-12-08 (×3): qty 0.2

## 2020-12-08 MED ORDER — INSULIN GLARGINE-YFGN 100 UNIT/ML ~~LOC~~ SOLN: 10.0000 [IU] | Freq: Every day | SUBCUTANEOUS | Status: DC

## 2020-12-08 NOTE — Progress Notes (Signed)
PROGRESS NOTE    Stephen Lynch   VFI:[id number]  DOB: [birth year]  DOA: 12/07/2020 PCP: [name], MD   Brief Narrative:  Stephen Lynch is a 75 y.o. male with medical history significant for advanced pancreatic cancer with bone metastases, insulin-dependent diabetes, HTN, HLD, anemia of chronic disease who presented to the ED for evaluation of dysuria and altered mental status.  History is supplemented by patient's significant other at bedside.  Patient reports about 2 days ago burning and pain with urination.  He reports decreased urine output than expected.  He has had constipation and reports last bowel movement several days ago.  He is passing flatus.  He has been feeling confused which is significant other also states that confusion is new compared to his baseline.  He has had nausea and low appetite but no vomiting.  He denies any chest pain, dyspnea, abdominal pain, diarrhea.     Subjective: He has no appetite and feels weak.     Assessment & Plan:   Principal Problem:   Complicated UTI (urinary tract infection)     Acute metabolic encephalopathy - cont Ceftriaxone and f/u on cultures - confusion is improving Active Problems:  Advanced prostate cancer with metastasis - castration resistant - me to right iliac bone - currently on Zytiga and Prednisone    Diabetes mellitus without complication ([place])  - cont Semglee and Novolog  Time spent in minutes: 35 DVT prophylaxis:  Code Status: full code Family Communication:  Level of Care: Level of care: Med-Surg Disposition Plan:  Status is: Observation  The patient will require care spanning > 2 midnights and should be moved to inpatient because: UTI with weakness and confusion     Consultants:  none Procedures:  none Antimicrobials:  Anti-infectives (From admission, onward)    Start     Dose/Rate Route Frequency Ordered Stop   12/08/20 2100  cefTRIAXone (ROCEPHIN) 1 g in sodium chloride 0.9 % 100 mL IVPB         1 g 200 mL/hr over 30 Minutes Intravenous Every 24 hours 12/07/20 2227     12/07/20 2030  cefTRIAXone (ROCEPHIN) 1 g in sodium chloride 0.9 % 100 mL IVPB        1 g 200 mL/hr over 30 Minutes Intravenous  Once 12/07/20 2029 12/07/20 2113        Objective: Vitals:   12/08/20 0541 12/08/20 0630 12/08/20 0945 12/08/20 1252  BP: (!) 150/49 131/60 (!) 130/51 (!) 149/67  Pulse: (!) 108 (!) 104 (!) 110 (!) 101  Resp: 19 20 18  (!) 22  Temp: 98.8 F (37.1 C)     TempSrc: Oral     SpO2: 96% 99% 97% 95%  Weight:      Height:        Intake/Output Summary (Last 24 hours) at 12/08/2020 1348 Last data filed at 12/07/2020 2113 Gross per 24 hour  Intake 1100 ml  Output --  Net 1100 ml   Filed Weights   12/07/20 1747  Weight: 105 kg    Examination: General exam: Appears comfortable  HEENT: PERRLA, oral mucosa moist, no sclera icterus or thrush Respiratory system: Clear to auscultation. Respiratory effort normal. Cardiovascular system: S1 & S2 heard, RRR.   Gastrointestinal system: Abdomen soft, non-tender, nondistended. Normal bowel sounds. Central nervous system: Alert and oriented. No focal neurological deficits. Extremities: No cyanosis, clubbing or edema Skin: No rashes or ulcers Psychiatry:  Mood & affect appropriate.     Data Reviewed: I have personally reviewed  following labs and imaging studies  CBC: Recent Labs  Lab 12/07/20 1812 12/08/20 0431  WBC 11.9* 10.8*  NEUTROABS 9.9*  --   HGB 7.7* 8.2*  HCT 25.1* 26.8*  MCV 90.6 91.8  PLT 482* 213*   Basic Metabolic Panel: Recent Labs  Lab 12/07/20 1812 12/08/20 0431  NA 134* 134*  K 4.5 4.4  CL 98 97*  CO2 27 28  GLUCOSE 169* 179*  BUN 20 19  CREATININE 0.91 0.77  CALCIUM 8.4* 8.6*   GFR: Estimated Creatinine Clearance: 103.1 mL/min (by C-G formula based on SCr of 0.77 mg/dL). Liver Function Tests: Recent Labs  Lab 12/07/20 1812  AST 52*  ALT 38  ALKPHOS 270*  BILITOT 0.6  PROT 7.3  ALBUMIN  1.9*   No results for input(s): LIPASE, AMYLASE in the last 168 hours. Recent Labs  Lab 12/07/20 1812  AMMONIA 15   Coagulation Profile: No results for input(s): INR, PROTIME in the last 168 hours. Cardiac Enzymes: No results for input(s): CKTOTAL, CKMB, CKMBINDEX, TROPONINI in the last 168 hours. BNP (last 3 results) No results for input(s): PROBNP in the last 8760 hours. HbA1C: No results for input(s): HGBA1C in the last 72 hours. CBG: Recent Labs  Lab 12/07/20 1830 12/08/20 0813 12/08/20 1148  GLUCAP 164* 159* 179*   Lipid Profile: No results for input(s): CHOL, HDL, LDLCALC, TRIG, CHOLHDL, LDLDIRECT in the last 72 hours. Thyroid Function Tests: No results for input(s): TSH, T4TOTAL, FREET4, T3FREE, THYROIDAB in the last 72 hours. Anemia Panel: Recent Labs    12/08/20 0431  VITAMINB12 2,553*  FOLATE 11.9  FERRITIN 6,144*  TIBC 104*  IRON 35*   Urine analysis:    Component Value Date/Time   COLORURINE YELLOW 12/07/2020 1935   APPEARANCEUR HAZY (A) 12/07/2020 1935   LABSPEC 1.020 12/07/2020 1935   PHURINE 5.0 12/07/2020 1935   GLUCOSEU NEGATIVE 12/07/2020 1935   HGBUR MODERATE (A) 12/07/2020 1935   BILIRUBINUR NEGATIVE 12/07/2020 1935   KETONESUR 5 (A) 12/07/2020 1935   PROTEINUR NEGATIVE 12/07/2020 1935   NITRITE POSITIVE (A) 12/07/2020 1935   LEUKOCYTESUR MODERATE (A) 12/07/2020 1935   Sepsis Labs: @LABRCNTIP (procalcitonin:4,lacticidven:4) ) Recent Results (from the past 240 hour(s))  Culture, blood (routine x 2)     Status: None (Preliminary result)   Collection Time: 12/07/20  6:10 PM   Specimen: BLOOD  Result Value Ref Range Status   Specimen Description   Final    BLOOD BLOOD RIGHT HAND Performed at [facility], [place] [address], [place], [place] [address]    Special Requests   Final    BOTTLES DRAWN AEROBIC AND ANAEROBIC Blood Culture adequate volume Performed at [facility] [address],  [place], [place] [address]    Culture   Final    NO GROWTH < 12 HOURS Performed at [facility] [address] [place], [place] [address]    Report Status PENDING  Incomplete  Culture, blood (routine x 2)     Status: None (Preliminary result)   Collection Time: 12/07/20  6:10 PM   Specimen: BLOOD  Result Value Ref Range Status   Specimen Description   Final    BLOOD PORTA CATH Performed at [facility] [address], [place], [place] [address]    Special Requests   Final    BOTTLES DRAWN AEROBIC AND ANAEROBIC Blood Culture adequate volume Performed at [facility] [address] [place], [place] [address]  Culture   Final    NO GROWTH < 12 HOURS Performed at [facility], [place] [address] [place], [place] [address]    Report Status PENDING  Incomplete  Resp Panel by RT-PCR (Flu A&B, Covid) Nasopharyngeal Swab     Status: None   Collection Time: 12/07/20  6:12 PM   Specimen: Nasopharyngeal Swab; Nasopharyngeal(NP) swabs in vial transport medium  Result Value Ref Range Status   SARS Coronavirus 2 by RT PCR NEGATIVE NEGATIVE Final    Comment: (NOTE) SARS-CoV-2 target nucleic acids are NOT DETECTED.  The SARS-CoV-2 RNA is generally detectable in upper respiratory specimens during the acute phase of infection. The lowest concentration of SARS-CoV-2 viral copies this assay can detect is 138 copies/mL. A negative result does not preclude SARS-Cov-2 infection and should not be used as the sole basis for treatment or other patient management decisions. A negative result may occur with  improper specimen collection/handling, submission of specimen other than nasopharyngeal swab, presence of viral mutation(s) within the areas targeted by this assay, and inadequate number of viral copies(<138 copies/mL). A negative result must be combined with clinical observations, patient history, and epidemiological information. The expected result  is Negative.  Fact Sheet for Patients:  EntrepreneurPulse.com.au  Fact Sheet for Healthcare Providers:  IncredibleEmployment.be  This test is no t yet approved or cleared by the [place] FDA and  has been authorized for detection and/or diagnosis of SARS-CoV-2 by FDA under an Emergency Use Authorization (EUA). This EUA will remain  in effect (meaning this test can be used) for the duration of the COVID-19 declaration under Section 564(b)(1) of the Act, 21 U.S.C.section 360bbb-3(b)(1), unless the authorization is terminated  or revoked sooner.       Influenza A by PCR NEGATIVE NEGATIVE Final   Influenza B by PCR NEGATIVE NEGATIVE Final    Comment: (NOTE) The Xpert Xpress SARS-CoV-2/FLU/RSV plus assay is intended as an aid in the diagnosis of influenza from Nasopharyngeal swab specimens and should not be used as a sole basis for treatment. Nasal washings and aspirates are unacceptable for Xpert Xpress SARS-CoV-2/FLU/RSV testing.  Fact Sheet for Patients: EntrepreneurPulse.com.au  Fact Sheet for Healthcare Providers: IncredibleEmployment.be  This test is not yet approved or cleared by the [place] FDA and has been authorized for detection and/or diagnosis of SARS-CoV-2 by FDA under an Emergency Use Authorization (EUA). This EUA will remain in effect (meaning this test can be used) for the duration of the COVID-19 declaration under Section 564(b)(1) of the Act, 21 U.S.C. section 360bbb-3(b)(1), unless the authorization is terminated or revoked.  Performed at [facility], [place] [address] [place], [place] [address]          Radiology Studies: CT Head Wo Contrast  Result Date: 12/07/2020 CLINICAL DATA:  Altered mental status. EXAM: CT HEAD WITHOUT CONTRAST TECHNIQUE: Contiguous axial images were obtained from the base of the skull through the vertex without intravenous  contrast. COMPARISON:  None. FINDINGS: Brain: The ventricles and sulci appropriate size for patient's age. The gray-white matter discrimination is preserved. There is no acute intracranial hemorrhage. No mass effect midline shift. No extra-axial fluid collection. Vascular: No hyperdense vessel or unexpected calcification. Skull: Normal. Negative for fracture or focal lesion. Sinuses/Orbits: No acute finding. Other: None IMPRESSION: Unremarkable noncontrast CT of the brain. Electronically Signed   By: Anner Crete M.D.   On: 12/07/2020 19:21   DG Chest Portable 1 View  Result Date: 12/07/2020 CLINICAL DATA:  aloc EXAM:  PORTABLE CHEST 1 VIEW COMPARISON:  PET CT November 14, 2020. FINDINGS: Lordotic positioning. Poorly defined right basilar opacities. No visible pleural effusions or pneumothorax. Cardiomediastinal silhouette is accentuated by AP technique. Right IJ Port-A-Cath the tip projecting at the lower SVC. IMPRESSION: Poorly defined right basilar opacities, possibly atelectasis. Infection is not excluded. Dedicated PA and lateral radiographs could better characterize if indicated. Electronically Signed   By: Margaretha Sheffield M.D.   On: 12/07/2020 18:32      Scheduled Meds:  DULoxetine  60 mg Oral Daily   enoxaparin (LOVENOX) injection  50 mg Subcutaneous Q24H   [START ON 12/09/2020] fentaNYL  1 patch Transdermal Q72H   insulin aspart  0-9 Units Subcutaneous TID WC   insulin glargine-yfgn  20 Units Subcutaneous QHS   rosuvastatin  10 mg Oral QPM   senna  1 tablet Oral BID   Continuous Infusions:  cefTRIAXone (ROCEPHIN)  IV       LOS: 0 days      Debbe Odea, MD [facility] Pager: [email or web address] 12/08/2020, 1:48 PM

## 2020-12-08 NOTE — ED Notes (Signed)
Patient sitting up on side of bed eating lunch tray. No complaints. No signs of distress.

## 2020-12-08 NOTE — Progress Notes (Signed)
Chaplain responded to spiritual consult for Advanced Directive.  Patient was alert but experiencing discomfort.  His Significant Other Stephen Lynch was not present. "I wish Stephen Lynch were here" he said.  Chaplain reviewed AD with patient explaining the process and decisions to be made.  Chaplain left document for patient to discuss with Stephen Lynch.  Chaplain explained he could complete the document and have it notarized after his discharge if that was more convenient. Rev. Tamsen Snider Pager [phone]

## 2020-12-09 ENCOUNTER — Encounter (HOSPITAL_COMMUNITY): Payer: Self-pay | Admitting: Internal Medicine

## 2020-12-09 DIAGNOSIS — G9341 Metabolic encephalopathy: Secondary | ICD-10-CM

## 2020-12-09 DIAGNOSIS — N390 Urinary tract infection, site not specified: Secondary | ICD-10-CM | POA: Diagnosis not present

## 2020-12-09 LAB — BASIC METABOLIC PANEL
Anion gap: 9 (ref 5–15)
BUN: 21 mg/dL (ref 8–23)
CO2: 28 mmol/L (ref 22–32)
Calcium: 9 mg/dL (ref 8.9–10.3)
Chloride: 97 mmol/L — ABNORMAL LOW (ref 98–111)
Creatinine, Ser: 0.7 mg/dL (ref 0.61–1.24)
GFR, Estimated: >60 mL/min (ref >60)
Glucose, Bld: 265 mg/dL — ABNORMAL HIGH (ref 70–99)
Potassium: 4.6 mmol/L (ref 3.5–5.1)
Sodium: 134 mmol/L — ABNORMAL LOW (ref 135–145)

## 2020-12-09 LAB — GLUCOSE, CAPILLARY
Glucose-Capillary: 241 mg/dL — ABNORMAL HIGH (ref 70–99)
Glucose-Capillary: 213 mg/dL — ABNORMAL HIGH (ref 70–99)
Glucose-Capillary: 232 mg/dL — ABNORMAL HIGH (ref 70–99)
Glucose-Capillary: 213 mg/dL — ABNORMAL HIGH (ref 70–99)

## 2020-12-09 LAB — URINE CULTURE: Culture: >=100000 — AB

## 2020-12-09 MED ORDER — LIDOCAINE 5 % EX PTCH
1.0000 | MEDICATED_PATCH | CUTANEOUS | Status: DC
  Administered 2020-12-09: 1 via TRANSDERMAL
  Filled 2020-12-09 (×2): qty 1

## 2020-12-09 MED ORDER — CEPHALEXIN 500 MG PO CAPS: 500.0000 mg | ORAL_CAPSULE | Freq: Four times a day (QID) | ORAL | 0 refills | Status: AC

## 2020-12-09 NOTE — Progress Notes (Signed)
PROGRESS NOTE    Stephen Lynch   JME:[id number]  DOB: [birth year]  DOA: 12/07/2020 PCP: [name], MD   Brief Narrative:  Stephen Lynch is a 75 y.o. male with medical history significant for advanced pancreatic cancer with bone metastases, insulin-dependent diabetes, HTN, HLD, anemia of chronic disease who presented to the ED for evaluation of dysuria and altered mental status.  History is supplemented by patient's significant other at bedside.  Patient reports about 2 days ago burning and pain with urination.  He reports decreased urine output than expected.  He has had constipation and reports last bowel movement several days ago.  He is passing flatus.  He has been feeling confused which is significant other also states that confusion is new compared to his baseline.  He has had nausea and low appetite but no vomiting.  He denies any chest pain, dyspnea, abdominal pain, diarrhea.     Subjective: He has no new complaints. Feels weak and still has no appetite.     Assessment & Plan:   Principal Problem:   Complicated UTI (urinary tract infection)     Acute metabolic encephalopathy-  - cont Ceftriaxone - for e coli UTI - confusion is improving but he remains somewhat confused - his significant other will come to the hospital to learn how to give him insulin as he is not really able to give himself insulin anymore  Active Problems:  Advanced prostate cancer with metastasis - castration resistant - me to right iliac bone - currently on Zytiga and Prednisone    Diabetes mellitus without complication ([place])  - cont Semglee and Novolog  Time spent in minutes: 35 DVT prophylaxis:  Code Status: full code Family Communication:  Level of Care: Level of care: Med-Surg Disposition Plan:  Status is: Observation  The patient will require care spanning > 2 midnights and should be moved to inpatient because: UTI with weakness and confusion  Consultants:  none Procedures:   none Antimicrobials:  Anti-infectives (From admission, onward)    Start     Dose/Rate Route Frequency Ordered Stop   12/09/20 0000  cephALEXin (KEFLEX) 500 MG capsule        500 mg Oral 4 times daily 12/09/20 1204 12/14/20 2359   12/08/20 2100  cefTRIAXone (ROCEPHIN) 1 g in sodium chloride 0.9 % 100 mL IVPB        1 g 200 mL/hr over 30 Minutes Intravenous Every 24 hours 12/07/20 2227     12/07/20 2030  cefTRIAXone (ROCEPHIN) 1 g in sodium chloride 0.9 % 100 mL IVPB        1 g 200 mL/hr over 30 Minutes Intravenous  Once 12/07/20 2029 12/07/20 2113        Objective: Vitals:   12/08/20 2239 12/09/20 0223 12/09/20 0546 12/09/20 1247  BP: 135/66 140/84 132/71 (!) 147/68  Pulse: 96 94 95 98  Resp: 20 18 20 18   Temp: 98 F (36.7 C) 97.7 F (36.5 C) 98.3 F (36.8 C) 99 F (37.2 C)  TempSrc:  Oral Oral Oral  SpO2: 99% 98% 93% 100%  Weight:      Height:        Intake/Output Summary (Last 24 hours) at 12/09/2020 1457 Last data filed at 12/09/2020 1100 Gross per 24 hour  Intake 220 ml  Output --  Net 220 ml    Filed Weights   12/07/20 1747  Weight: 105 kg    Examination: General exam: Appears comfortable  HEENT: PERRLA, oral mucosa moist, no  sclera icterus or thrush Respiratory system: Clear to auscultation. Respiratory effort normal. Cardiovascular system: S1 & S2 heard, regular rate and rhythm Gastrointestinal system: Abdomen soft, non-tender, nondistended. Normal bowel sounds   Central nervous system: Alert and oriented. No focal neurological deficits. Extremities: No cyanosis, clubbing or edema Skin: No rashes or ulcers Psychiatry:  Mood & affect appropriate.      Data Reviewed: I have personally reviewed following labs and imaging studies  CBC: Recent Labs  Lab 12/07/20 1812 12/08/20 0431  WBC 11.9* 10.8*  NEUTROABS 9.9*  --   HGB 7.7* 8.2*  HCT 25.1* 26.8*  MCV 90.6 91.8  PLT 482* 470*    Basic Metabolic Panel: Recent Labs  Lab 12/07/20 1812  12/08/20 0431 12/09/20 0513  NA 134* 134* 134*  K 4.5 4.4 4.6  CL 98 97* 97*  CO2 27 28 28   GLUCOSE 169* 179* 265*  BUN 20 19 21   CREATININE 0.91 0.77 0.70  CALCIUM 8.4* 8.6* 9.0    GFR: Estimated Creatinine Clearance: 103.1 mL/min (by C-G formula based on SCr of 0.7 mg/dL). Liver Function Tests: Recent Labs  Lab 12/07/20 1812  AST 52*  ALT 38  ALKPHOS 270*  BILITOT 0.6  PROT 7.3  ALBUMIN 1.9*    No results for input(s): LIPASE, AMYLASE in the last 168 hours. Recent Labs  Lab 12/07/20 1812  AMMONIA 15    Coagulation Profile: No results for input(s): INR, PROTIME in the last 168 hours. Cardiac Enzymes: No results for input(s): CKTOTAL, CKMB, CKMBINDEX, TROPONINI in the last 168 hours. BNP (last 3 results) No results for input(s): PROBNP in the last 8760 hours. HbA1C: No results for input(s): HGBA1C in the last 72 hours. CBG: Recent Labs  Lab 12/08/20 1148 12/08/20 1719 12/08/20 2124 12/09/20 0747 12/09/20 1126  GLUCAP 179* 207* 244* 241* 213*    Lipid Profile: No results for input(s): CHOL, HDL, LDLCALC, TRIG, CHOLHDL, LDLDIRECT in the last 72 hours. Thyroid Function Tests: No results for input(s): TSH, T4TOTAL, FREET4, T3FREE, THYROIDAB in the last 72 hours. Anemia Panel: Recent Labs    12/08/20 0431  VITAMINB12 2,553*  FOLATE 11.9  FERRITIN 6,144*  TIBC 104*  IRON 35*    Urine analysis:    Component Value Date/Time   COLORURINE YELLOW 12/07/2020 1935   APPEARANCEUR HAZY (A) 12/07/2020 1935   LABSPEC 1.020 12/07/2020 1935   PHURINE 5.0 12/07/2020 1935   GLUCOSEU NEGATIVE 12/07/2020 1935   HGBUR MODERATE (A) 12/07/2020 1935   BILIRUBINUR NEGATIVE 12/07/2020 1935   KETONESUR 5 (A) 12/07/2020 1935   PROTEINUR NEGATIVE 12/07/2020 1935   NITRITE POSITIVE (A) 12/07/2020 1935   LEUKOCYTESUR MODERATE (A) 12/07/2020 1935   Sepsis Labs: @LABRCNTIP (procalcitonin:4,lacticidven:4) ) Recent Results (from the past 240 hour(s))  Culture, blood  (routine x 2)     Status: None (Preliminary result)   Collection Time: 12/07/20  6:10 PM   Specimen: BLOOD  Result Value Ref Range Status   Specimen Description   Final    BLOOD BLOOD RIGHT HAND Performed at [facility], [place] [address], [place], [place] [address]    Special Requests   Final    BOTTLES DRAWN AEROBIC AND ANAEROBIC Blood Culture adequate volume Performed at [place] [address] [place], [place] [address]    Culture   Final    NO GROWTH 2 DAYS Performed at [facility] [address] [place], [place] [address]    Report Status PENDING  Incomplete  Culture, blood (routine x 2)     Status: None (Preliminary result)   Collection Time: 12/07/20  6:10 PM   Specimen: BLOOD  Result Value Ref Range Status   Specimen Description   Final    BLOOD PORTA CATH Performed at [facility] [address] [place], [place] [address]    Special Requests   Final    BOTTLES DRAWN AEROBIC AND ANAEROBIC Blood Culture adequate volume Performed at [facility] [address], [place], [place] [address]    Culture   Final    NO GROWTH 2 DAYS Performed at [facility] [address] [place], [place] [address]    Report Status PENDING  Incomplete  Resp Panel by RT-PCR (Flu A&B, Covid) Nasopharyngeal Swab     Status: None   Collection Time: 12/07/20  6:12 PM   Specimen: Nasopharyngeal Swab; Nasopharyngeal(NP) swabs in vial transport medium  Result Value Ref Range Status   SARS Coronavirus 2 by RT PCR NEGATIVE NEGATIVE Final    Comment: (NOTE) SARS-CoV-2 target nucleic acids are NOT DETECTED.  The SARS-CoV-2 RNA is generally detectable in upper respiratory specimens during the acute phase of infection. The lowest concentration of SARS-CoV-2 viral copies this assay can detect is 138 copies/mL. A negative result does not preclude SARS-Cov-2 infection and should not be used as the sole  basis for treatment or other patient management decisions. A negative result may occur with  improper specimen collection/handling, submission of specimen other than nasopharyngeal swab, presence of viral mutation(s) within the areas targeted by this assay, and inadequate number of viral copies(<138 copies/mL). A negative result must be combined with clinical observations, patient history, and epidemiological information. The expected result is Negative.  Fact Sheet for Patients:  EntrepreneurPulse.com.au  Fact Sheet for Healthcare Providers:  IncredibleEmployment.be  This test is no t yet approved or cleared by the [place] FDA and  has been authorized for detection and/or diagnosis of SARS-CoV-2 by FDA under an Emergency Use Authorization (EUA). This EUA will remain  in effect (meaning this test can be used) for the duration of the COVID-19 declaration under Section 564(b)(1) of the Act, 21 U.S.C.section 360bbb-3(b)(1), unless the authorization is terminated  or revoked sooner.       Influenza A by PCR NEGATIVE NEGATIVE Final   Influenza B by PCR NEGATIVE NEGATIVE Final    Comment: (NOTE) The Xpert Xpress SARS-CoV-2/FLU/RSV plus assay is intended as an aid in the diagnosis of influenza from Nasopharyngeal swab specimens and should not be used as a sole basis for treatment. Nasal washings and aspirates are unacceptable for Xpert Xpress SARS-CoV-2/FLU/RSV testing.  Fact Sheet for Patients: EntrepreneurPulse.com.au  Fact Sheet for Healthcare Providers: IncredibleEmployment.be  This test is not yet approved or cleared by the [place] FDA and has been authorized for detection and/or diagnosis of SARS-CoV-2 by FDA under an Emergency Use Authorization (EUA). This EUA will remain in effect (meaning this test can be used) for the duration of the COVID-19 declaration under Section 564(b)(1) of the Act,  21 U.S.C. section 360bbb-3(b)(1), unless the authorization is terminated or revoked.  Performed at [facility], [place] [address], [place], [place] [address]   Urine Culture     Status: Abnormal   Collection Time: 12/07/20  7:35 PM   Specimen: In/Out Cath Urine  Result Value Ref Range Status   Specimen Description   Final    IN/OUT CATH URINE Performed at [facility],  [place] [address], [place], [place] [address]    Special Requests   Final    NONE Performed at [facility], [place] [address], [place], [place] [address]    Culture >=100,000 COLONIES/mL ESCHERICHIA COLI (A)  Final   Report Status 12/09/2020 FINAL  Final   Organism ID, Bacteria ESCHERICHIA COLI (A)  Final      Susceptibility   Escherichia coli - MIC*    AMPICILLIN 4 SENSITIVE Sensitive     CEFAZOLIN <=4 SENSITIVE Sensitive     CEFEPIME <=0.12 SENSITIVE Sensitive     CEFTRIAXONE <=0.25 SENSITIVE Sensitive     CIPROFLOXACIN <=0.25 SENSITIVE Sensitive     GENTAMICIN <=1 SENSITIVE Sensitive     IMIPENEM <=0.25 SENSITIVE Sensitive     NITROFURANTOIN <=16 SENSITIVE Sensitive     TRIMETH/SULFA <=20 SENSITIVE Sensitive     AMPICILLIN/SULBACTAM <=2 SENSITIVE Sensitive     PIP/TAZO <=4 SENSITIVE Sensitive     * >=100,000 COLONIES/mL ESCHERICHIA COLI         Radiology Studies: CT Head Wo Contrast  Result Date: 12/07/2020 CLINICAL DATA:  Altered mental status. EXAM: CT HEAD WITHOUT CONTRAST TECHNIQUE: Contiguous axial images were obtained from the base of the skull through the vertex without intravenous contrast. COMPARISON:  None. FINDINGS: Brain: The ventricles and sulci appropriate size for patient's age. The gray-white matter discrimination is preserved. There is no acute intracranial hemorrhage. No mass effect midline shift. No extra-axial fluid collection. Vascular: No hyperdense vessel or unexpected calcification. Skull: Normal. Negative for fracture or focal  lesion. Sinuses/Orbits: No acute finding. Other: None IMPRESSION: Unremarkable noncontrast CT of the brain. Electronically Signed   By: Anner Crete M.D.   On: 12/07/2020 19:21   DG Chest Portable 1 View  Result Date: 12/07/2020 CLINICAL DATA:  aloc EXAM: PORTABLE CHEST 1 VIEW COMPARISON:  PET CT November 14, 2020. FINDINGS: Lordotic positioning. Poorly defined right basilar opacities. No visible pleural effusions or pneumothorax. Cardiomediastinal silhouette is accentuated by AP technique. Right IJ Port-A-Cath the tip projecting at the lower SVC. IMPRESSION: Poorly defined right basilar opacities, possibly atelectasis. Infection is not excluded. Dedicated PA and lateral radiographs could better characterize if indicated. Electronically Signed   By: Margaretha Sheffield M.D.   On: 12/07/2020 18:32      Scheduled Meds:  DULoxetine  60 mg Oral Daily   enoxaparin (LOVENOX) injection  50 mg Subcutaneous Q24H   fentaNYL  1 patch Transdermal Q72H   insulin aspart  0-9 Units Subcutaneous TID WC   insulin glargine-yfgn  20 Units Subcutaneous QHS   rosuvastatin  10 mg Oral QPM   senna  1 tablet Oral BID   Continuous Infusions:  cefTRIAXone (ROCEPHIN)  IV Stopped (12/09/20 0003)     LOS: 1 day      Debbe Odea, MD [facility] Pager: [email or web address] 12/09/2020, 2:57 PM

## 2020-12-09 NOTE — Discharge Summary (Signed)
Physician Discharge Summary  Stephen Lynch TKP:[id number] DOB: [birth year] DOA: 12/07/2020  PCP: [name], MD  Admit date: 12/07/2020 Discharge date: 12/10/2020  Admitted From: home  Disposition:  home   Recommendations for Outpatient Follow-up:  Continue attempts a PT  Home Health:  ordered  Discharge Condition:  stable   CODE STATUS:  Full code   Diet recommendation:  carb modified diet Consultations: none  Procedures/Studies: none   Discharge Diagnoses:  Principal Problem:   Complicated UTI (urinary tract infection) Active Problems:   Acute metabolic encephalopathy   Dyslipidemia   Diabetes mellitus without complication ([place])   Metastatic Prostate cancer ([place])   Brief Summary: Stephen Lynch is a 75 y.o. male with medical history significant for advanced pancreatic cancer with bone metastases, insulin-dependent diabetes, HTN, HLD, anemia of chronic disease who presented to the ED for evaluation of dysuria and altered mental status.  History is supplemented by patient's significant other at bedside.  Patient reports about 2 days ago burning and pain with urination.  He reports decreased urine output than expected.  He has had constipation and reports last bowel movement several days ago.  He is passing flatus.  He has been feeling confused which is significant other also states that confusion is new compared to his baseline.  He has had nausea and low appetite but no vomiting.  He denies any chest pain, dyspnea, abdominal pain, diarrhea.    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 - currently on Zytiga and Prednisone     Diabetes mellitus without complication ([place])  - cont Semglee and Novolog     Discharge Exam: Vitals:   12/10/20 0415 12/10/20 1256  BP: 136/82 130/69  Pulse: 98 94  Resp: 18 18  Temp: 99 F (37.2 C) 98.4 F (36.9 C)  SpO2: 100% 99%   Vitals:   12/09/20 1247 12/09/20 2013 12/10/20 0415 12/10/20 1256  BP: (!) 147/68 (!) 158/50 136/82 130/69  Pulse: 98 67 98 94  Resp: 18 18 18 18   Temp: 99 F (37.2 C) 97.8 F (36.6 C) 99 F (37.2 C) 98.4 F (36.9 C)  TempSrc: Oral Oral Oral Oral  SpO2: 100% 100% 100% 99%  Weight:      Height:        General: Pt is alert, awake, not in acute distress Cardiovascular: RRR, S1/S2 +, no rubs, no gallops Respiratory: CTA bilaterally, no wheezing, no rhonchi Abdominal: Soft, NT, ND, bowel sounds + Extremities: no edema, no cyanosis   Discharge Instructions  Discharge Instructions     Diet - low sodium heart healthy   Complete by: As directed    Increase activity slowly   Complete by: As directed       Allergies as of 12/10/2020       Reactions   Chlorhexidine         Medication List     STOP taking these medications    acetaminophen 500 MG tablet Commonly known as: TYLENOL   HYDROmorphone 4 MG tablet Commonly known as: Dilaudid  TAKE these medications    ASPIRIN 81 PO Take 81 mg by mouth daily.   Calcium-Magnesium-Zinc 333-133-5 MG Tabs Take 1 tablet by mouth daily.   cephALEXin 500 MG capsule Commonly known as: KEFLEX Take 1 capsule (500 mg total) by mouth 4 (four) times daily for 5 days.   Chromium 1 MG Caps Take by mouth.   DULoxetine 60 MG capsule Commonly known as: CYMBALTA Take 60 mg by mouth daily.   fentaNYL 50 MCG/HR Commonly known as: Alexander 1 patch onto the skin every 3 (three) days.   FreeStyle Libre 2 Sensor Misc APPLY EVERY 14 DAYS, CHECK GLUCOSE BEFORE EACH MEAL, 2  HRS AFTER, IN THE MORNING AND BEFORE BEDTIME   HumaLOG Junior KwikPen 100 UNIT/ML KwikPen Junior Generic drug: insulin lispro Inject 0.2 mLs (20 Units total) into the skin 3 (three) times daily after meals. What changed:  how much to take additional instructions   lidocaine 5 % Commonly known as: LIDODERM Place 2 patches onto the skin daily. Remove & Discard patch within 12 hours or as directed by MD   lidocaine-prilocaine cream Commonly known as: EMLA Apply 1 application topically as needed.   loperamide 1 MG/5ML solution Commonly known as: IMODIUM Take 2 mg by mouth daily as needed for diarrhea or loose stools.   magnesium 30 MG tablet Take 30 mg by mouth 2 (two) times daily.   ondansetron 4 MG tablet Commonly known as: ZOFRAN TAKE 1 TABLET BY MOUTH EVERY 6 HOURS AS NEEDED. FOR NAUSEA What changed:  when to take this reasons to take this additional instructions   oxyCODONE-acetaminophen 7.5-325 MG tablet Commonly known as: PERCOCET Take 1 tablet by mouth 4 (four) times daily as needed. What changed: reasons to take this   prochlorperazine 10 MG tablet Commonly known as: COMPAZINE TAKE 1 TABLET BY MOUTH EVERY 6 HOURS AS NEEDED FOR NAUSEA OR VOMITING.   rosuvastatin 10 MG tablet Commonly known as: CRESTOR Take 1 tablet (10 mg total) by mouth daily. What changed: when to take this   TIGER BALM ARTHRITIS RUB EX Apply 1 application topically 2 (two) times daily as needed (back pain).   [name] FlexTouch 100 UNIT/ML FlexTouch Pen Generic drug: insulin degludec Inject 36 Units into the skin at bedtime.        Follow-up Information     [facility] Follow up.   Why: Malachy Mood from [facility] will contact you to set up start of care.  Her phone number is [phone]. Contact information: [place]               Allergies  Allergen Reactions   Chlorhexidine       CT Head Wo Contrast  Result Date: 12/07/2020 CLINICAL DATA:  Altered mental status. EXAM: CT  HEAD WITHOUT CONTRAST TECHNIQUE: Contiguous axial images were obtained from the base of the skull through the vertex without intravenous contrast. COMPARISON:  None. FINDINGS: Brain: The ventricles and sulci appropriate size for patient's age. The gray-white matter discrimination is preserved. There is no acute intracranial hemorrhage. No mass effect midline shift. No extra-axial fluid collection. Vascular: No hyperdense vessel or unexpected calcification. Skull: Normal. Negative for fracture or focal lesion. Sinuses/Orbits: No acute finding. Other: None IMPRESSION: Unremarkable noncontrast CT of the brain. Electronically Signed   By: Anner Crete M.D.   On: 12/07/2020 19:21   DG Chest Portable 1 View  Result Date: 12/07/2020 CLINICAL DATA:  aloc EXAM: PORTABLE CHEST 1 VIEW COMPARISON:  PET CT November 14, 2020. FINDINGS: Lordotic  positioning. Poorly defined right basilar opacities. No visible pleural effusions or pneumothorax. Cardiomediastinal silhouette is accentuated by AP technique. Right IJ Port-A-Cath the tip projecting at the lower SVC. IMPRESSION: Poorly defined right basilar opacities, possibly atelectasis. Infection is not excluded. Dedicated PA and lateral radiographs could better characterize if indicated. Electronically Signed   By: Margaretha Sheffield M.D.   On: 12/07/2020 18:32   NM PET (PSMA) SKULL TO MID THIGH  Result Date: 11/16/2020 CLINICAL DATA:  74 year old male castrate resistant metastatic prostate carcinoma. EXAM: NUCLEAR MEDICINE PET SKULL BASE TO THIGH TECHNIQUE: 9.4 mCi F18 Piflufolastat (Pylarify) was injected intravenously. Full-ring PET imaging was performed from the skull base to thigh after the radiotracer. CT data was obtained and used for attenuation correction and anatomic localization. COMPARISON:  CT 05/23/2020 FINDINGS: NECK No radiotracer activity in neck lymph nodes. Incidental CT finding: None CHEST No abnormal radiotracer activity in the mediastinum. No abnormal  radiotracer activity associated with ground-glass nodule in the LEFT upper lobe measuring 7 mm on image 87/4. Nodule not changed from CT 05/23/2020) Incidental CT finding: None ABDOMEN/PELVIS Prostate: No focal activity in the prostate gland. Lymph nodes: No abnormal radiotracer accumulation within pelvic or abdominal nodes. Liver: No evidence of liver metastasis Incidental CT finding: None SKELETON Innumerable foci of intense radiotracer activity within the axillary and appendicular skeleton. There is a diffuse underlying sclerotic pattern within the bones on CT portion exam. Example lesion inferior RIGHT pubic ramus with SUV max equal 16.3 on image 209. Example lesion in the T9 vertebral body with SUM max equal 37.7. Smaller example lesion in the L5 vertebral body with SUV max equal 18.7. Broad lesion in the RIGHT humeral head with SUV max equal 30.8. Lesions extend to the calvarium and central all the visualized bones. IMPRESSION: 1. Widespread intensely radiotracer avid metastatic prostate cancer within the axillary appendicular skeleton. 2. No evidence radiotracer avid disease within the prostate gland. 3. No lymphadenopathy or visceral metastasis. 4. Stable ground-glass nodule in LEFT upper lobe. Electronically Signed   By: Suzy Bouchard M.D.   On: 11/16/2020 09:29     The results of significant diagnostics from this hospitalization (including imaging, microbiology, ancillary and laboratory) are listed below for reference.     Microbiology: Recent Results (from the past 240 hour(s))  Culture, blood (routine x 2)     Status: None (Preliminary result)   Collection Time: 12/07/20  6:10 PM   Specimen: BLOOD  Result Value Ref Range Status   Specimen Description   Final    BLOOD BLOOD RIGHT HAND Performed at [facility], [place] [address] [place], [place] [address]    Special Requests   Final    BOTTLES DRAWN AEROBIC AND ANAEROBIC Blood Culture adequate volume Performed at  [place] [address], [place], [place] [address]    Culture   Final    NO GROWTH 3 DAYS Performed at [facility], [place] [address], [place], [place] [address]    Report Status PENDING  Incomplete  Culture, blood (routine x 2)     Status: None (Preliminary result)   Collection Time: 12/07/20  6:10 PM   Specimen: BLOOD  Result Value Ref Range Status   Specimen Description   Final    BLOOD PORTA CATH Performed at [place] [address] [place],  [address]    Special Requests   Final    BOTTLES DRAWN AEROBIC AND ANAEROBIC Blood Culture adequate volume Performed at  [facility], [place] [address] [place], [place] [address]    Culture   Final    NO GROWTH 3 DAYS Performed at [facility], [place] [address], [place], [place] [address]    Report Status PENDING  Incomplete  Resp Panel by RT-PCR (Flu A&B, Covid) Nasopharyngeal Swab     Status: None   Collection Time: 12/07/20  6:12 PM   Specimen: Nasopharyngeal Swab; Nasopharyngeal(NP) swabs in vial transport medium  Result Value Ref Range Status   SARS Coronavirus 2 by RT PCR NEGATIVE NEGATIVE Final    Comment: (NOTE) SARS-CoV-2 target nucleic acids are NOT DETECTED.  The SARS-CoV-2 RNA is generally detectable in upper respiratory specimens during the acute phase of infection. The lowest concentration of SARS-CoV-2 viral copies this assay can detect is 138 copies/mL. A negative result does not preclude SARS-Cov-2 infection and should not be used as the sole basis for treatment or other patient management decisions. A negative result may occur with  improper specimen collection/handling, submission of specimen other than nasopharyngeal swab, presence of viral mutation(s) within the areas targeted by this assay, and inadequate number of viral copies(<138 copies/mL). A negative result must be combined with clinical observations, patient history, and  epidemiological information. The expected result is Negative.  Fact Sheet for Patients:  EntrepreneurPulse.com.au  Fact Sheet for Healthcare Providers:  IncredibleEmployment.be  This test is no t yet approved or cleared by the [place] FDA and  has been authorized for detection and/or diagnosis of SARS-CoV-2 by FDA under an Emergency Use Authorization (EUA). This EUA will remain  in effect (meaning this test can be used) for the duration of the COVID-19 declaration under Section 564(b)(1) of the Act, 21 U.S.C.section 360bbb-3(b)(1), unless the authorization is terminated  or revoked sooner.       Influenza A by PCR NEGATIVE NEGATIVE Final   Influenza B by PCR NEGATIVE NEGATIVE Final    Comment: (NOTE) The Xpert Xpress SARS-CoV-2/FLU/RSV plus assay is intended as an aid in the diagnosis of influenza from Nasopharyngeal swab specimens and should not be used as a sole basis for treatment. Nasal washings and aspirates are unacceptable for Xpert Xpress SARS-CoV-2/FLU/RSV testing.  Fact Sheet for Patients: EntrepreneurPulse.com.au  Fact Sheet for Healthcare Providers: IncredibleEmployment.be  This test is not yet approved or cleared by the [place] FDA and has been authorized for detection and/or diagnosis of SARS-CoV-2 by FDA under an Emergency Use Authorization (EUA). This EUA will remain in effect (meaning this test can be used) for the duration of the COVID-19 declaration under Section 564(b)(1) of the Act, 21 U.S.C. section 360bbb-3(b)(1), unless the authorization is terminated or revoked.  Performed at [facility], [place] [address], [place], [place] [address]   Urine Culture     Status: Abnormal   Collection Time: 12/07/20  7:35 PM   Specimen: In/Out Cath Urine  Result Value Ref Range Status   Specimen Description   Final    IN/OUT CATH URINE Performed at [place] [address], [place], [place] [address]    Special Requests   Final    NONE Performed at [facility], [place] [address] [place], [place] [address]    Culture >=100,000 COLONIES/mL ESCHERICHIA COLI (A)  Final   Report Status 12/09/2020 FINAL  Final   Organism ID, Bacteria ESCHERICHIA COLI (A)  Final      Susceptibility   Escherichia coli - MIC*    AMPICILLIN 4 SENSITIVE  Sensitive     CEFAZOLIN <=4 SENSITIVE Sensitive     CEFEPIME <=0.12 SENSITIVE Sensitive     CEFTRIAXONE <=0.25 SENSITIVE Sensitive     CIPROFLOXACIN <=0.25 SENSITIVE Sensitive     GENTAMICIN <=1 SENSITIVE Sensitive     IMIPENEM <=0.25 SENSITIVE Sensitive     NITROFURANTOIN <=16 SENSITIVE Sensitive     TRIMETH/SULFA <=20 SENSITIVE Sensitive     AMPICILLIN/SULBACTAM <=2 SENSITIVE Sensitive     PIP/TAZO <=4 SENSITIVE Sensitive     * >=100,000 COLONIES/mL ESCHERICHIA COLI     Labs: BNP (last 3 results) No results for input(s): BNP in the last 8760 hours. Basic Metabolic Panel: Recent Labs  Lab 12/07/20 1812 12/08/20 0431 12/09/20 0513  NA 134* 134* 134*  K 4.5 4.4 4.6  CL 98 97* 97*  CO2 27 28 28   GLUCOSE 169* 179* 265*  BUN 20 19 21   CREATININE 0.91 0.77 0.70  CALCIUM 8.4* 8.6* 9.0   Liver Function Tests: Recent Labs  Lab 12/07/20 1812  AST 52*  ALT 38  ALKPHOS 270*  BILITOT 0.6  PROT 7.3  ALBUMIN 1.9*   No results for input(s): LIPASE, AMYLASE in the last 168 hours. Recent Labs  Lab 12/07/20 1812  AMMONIA 15   CBC: Recent Labs  Lab 12/07/20 1812 12/08/20 0431  WBC 11.9* 10.8*  NEUTROABS 9.9*  --   HGB 7.7* 8.2*  HCT 25.1* 26.8*  MCV 90.6 91.8  PLT 482* 470*   Cardiac Enzymes: No results for input(s): CKTOTAL, CKMB, CKMBINDEX, TROPONINI in the last 168 hours. BNP: Invalid input(s): POCBNP CBG: Recent Labs  Lab 12/09/20 1714 12/09/20 2156 12/10/20 0726 12/10/20 1053 12/10/20 1133  GLUCAP 232* 213* 188* 191* 177*   D-Dimer No  results for input(s): DDIMER in the last 72 hours. Hgb A1c No results for input(s): HGBA1C in the last 72 hours. Lipid Profile No results for input(s): CHOL, HDL, LDLCALC, TRIG, CHOLHDL, LDLDIRECT in the last 72 hours. Thyroid function studies No results for input(s): TSH, T4TOTAL, T3FREE, THYROIDAB in the last 72 hours.  Invalid input(s): FREET3 Anemia work up Recent Labs    12/08/20 0431  VITAMINB12 2,553*  FOLATE 11.9  FERRITIN 6,144*  TIBC 104*  IRON 35*   Urinalysis    Component Value Date/Time   COLORURINE YELLOW 12/07/2020 1935   APPEARANCEUR HAZY (A) 12/07/2020 1935   LABSPEC 1.020 12/07/2020 1935   PHURINE 5.0 12/07/2020 1935   GLUCOSEU NEGATIVE 12/07/2020 1935   HGBUR MODERATE (A) 12/07/2020 1935   BILIRUBINUR NEGATIVE 12/07/2020 1935   KETONESUR 5 (A) 12/07/2020 1935   PROTEINUR NEGATIVE 12/07/2020 1935   NITRITE POSITIVE (A) 12/07/2020 1935   LEUKOCYTESUR MODERATE (A) 12/07/2020 1935   Sepsis Labs Invalid input(s): PROCALCITONIN,  WBC,  LACTICIDVEN Microbiology Recent Results (from the past 240 hour(s))  Culture, blood (routine x 2)     Status: None (Preliminary result)   Collection Time: 12/07/20  6:10 PM   Specimen: BLOOD  Result Value Ref Range Status   Specimen Description   Final    BLOOD BLOOD RIGHT HAND Performed at [facility], [place] [address], [place], [place] [address]    Special Requests   Final    BOTTLES DRAWN AEROBIC AND ANAEROBIC Blood Culture adequate volume Performed at [place] [address], [place], [place] [address]    Culture   Final    NO GROWTH 3 DAYS Performed at [facility], [place] [address] [place], [place] [address]  Report Status PENDING  Incomplete  Culture, blood (routine x 2)     Status: None (Preliminary result)   Collection Time: 12/07/20  6:10 PM   Specimen: BLOOD  Result Value Ref Range Status   Specimen Description   Final    BLOOD PORTA CATH Performed at  [place] [address] [place], [place] [address]    Special Requests   Final    BOTTLES DRAWN AEROBIC AND ANAEROBIC Blood Culture adequate volume Performed at [place] [address], [place], [place] [address]    Culture   Final    NO GROWTH 3 DAYS Performed at [facility], [place] [address] [place], [place] [address]    Report Status PENDING  Incomplete  Resp Panel by RT-PCR (Flu A&B, Covid) Nasopharyngeal Swab     Status: None   Collection Time: 12/07/20  6:12 PM   Specimen: Nasopharyngeal Swab; Nasopharyngeal(NP) swabs in vial transport medium  Result Value Ref Range Status   SARS Coronavirus 2 by RT PCR NEGATIVE NEGATIVE Final    Comment: (NOTE) SARS-CoV-2 target nucleic acids are NOT DETECTED.  The SARS-CoV-2 RNA is generally detectable in upper respiratory specimens during the acute phase of infection. The lowest concentration of SARS-CoV-2 viral copies this assay can detect is 138 copies/mL. A negative result does not preclude SARS-Cov-2 infection and should not be used as the sole basis for treatment or other patient management decisions. A negative result may occur with  improper specimen collection/handling, submission of specimen other than nasopharyngeal swab, presence of viral mutation(s) within the areas targeted by this assay, and inadequate number of viral copies(<138 copies/mL). A negative result must be combined with clinical observations, patient history, and epidemiological information. The expected result is Negative.  Fact Sheet for Patients:  EntrepreneurPulse.com.au  Fact Sheet for Healthcare Providers:  IncredibleEmployment.be  This test is no t yet approved or cleared by the [place] FDA and  has been authorized for detection and/or diagnosis of SARS-CoV-2 by FDA under an Emergency Use Authorization (EUA). This EUA will remain  in effect (meaning this  test can be used) for the duration of the COVID-19 declaration under Section 564(b)(1) of the Act, 21 U.S.C.section 360bbb-3(b)(1), unless the authorization is terminated  or revoked sooner.       Influenza A by PCR NEGATIVE NEGATIVE Final   Influenza B by PCR NEGATIVE NEGATIVE Final    Comment: (NOTE) The Xpert Xpress SARS-CoV-2/FLU/RSV plus assay is intended as an aid in the diagnosis of influenza from Nasopharyngeal swab specimens and should not be used as a sole basis for treatment. Nasal washings and aspirates are unacceptable for Xpert Xpress SARS-CoV-2/FLU/RSV testing.  Fact Sheet for Patients: EntrepreneurPulse.com.au  Fact Sheet for Healthcare Providers: IncredibleEmployment.be  This test is not yet approved or cleared by the [place] FDA and has been authorized for detection and/or diagnosis of SARS-CoV-2 by FDA under an Emergency Use Authorization (EUA). This EUA will remain in effect (meaning this test can be used) for the duration of the COVID-19 declaration under Section 564(b)(1) of the Act, 21 U.S.C. section 360bbb-3(b)(1), unless the authorization is terminated or revoked.  Performed at [facility], [place] [address] [place], [place] [address]   Urine Culture     Status: Abnormal   Collection Time: 12/07/20  7:35 PM   Specimen: In/Out Cath Urine  Result Value Ref Range Status   Specimen Description   Final    IN/OUT CATH URINE  Performed at [facility], [place] [address], [place], [place] [address]    Special Requests   Final    NONE Performed at [facility], [place] [address], [place], [place] [address]    Culture >=100,000 COLONIES/mL ESCHERICHIA COLI (A)  Final   Report Status 12/09/2020 FINAL  Final   Organism ID, Bacteria ESCHERICHIA COLI (A)  Final      Susceptibility   Escherichia coli - MIC*    AMPICILLIN 4 SENSITIVE Sensitive     CEFAZOLIN <=4  SENSITIVE Sensitive     CEFEPIME <=0.12 SENSITIVE Sensitive     CEFTRIAXONE <=0.25 SENSITIVE Sensitive     CIPROFLOXACIN <=0.25 SENSITIVE Sensitive     GENTAMICIN <=1 SENSITIVE Sensitive     IMIPENEM <=0.25 SENSITIVE Sensitive     NITROFURANTOIN <=16 SENSITIVE Sensitive     TRIMETH/SULFA <=20 SENSITIVE Sensitive     AMPICILLIN/SULBACTAM <=2 SENSITIVE Sensitive     PIP/TAZO <=4 SENSITIVE Sensitive     * >=100,000 COLONIES/mL ESCHERICHIA COLI     Time coordinating discharge in minutes: 65  SIGNED:   Debbe Odea, MD  [facility] 12/10/2020, 3:02 PM

## 2020-12-09 NOTE — Progress Notes (Signed)
Patient in chair for 3 hours. Ambulated again after dinner for 242ft with moderate assist. Patient returned to bed at this time.

## 2020-12-09 NOTE — Progress Notes (Signed)
Patient ambulated to bathroom this morning. Assisted patient with brushing teeth, washing up, changing linens, and ordering food. No complaints at this time.

## 2020-12-09 NOTE — Evaluation (Addendum)
Physical Therapy Evaluation Patient Details Name: Stephen Lynch MRN: [id number] DOB: [birth year] Today's Date: 12/09/2020  History of Present Illness  Pt is 75 yo male with current hx of advanced pancreatic CA with bone metastases who presented on 12/07/20 with burning urination and AMS.  Pt admitted with UTI and metabolic encephalopathy.  Pt also with DM, HTN, HLD, and anemia.  Clinical Impression  Pt admitted with above diagnosis. Normally, pt is completely independent.  He lives with fiancee in multi-level house (could stay on first floor if needed).  Today, pt confused requiring increased time to respond and increased cues. He required min-mod A for most transfers but max A from low toilet.  Did ambulated 100' but with min A and frequent cues. Performed 3 steps with mod A and fatigued easily.  Pt is well below his baseline and would benefit from PT services.  Feel that could progress to [facility] level with supervision, but not safe for that today from PT perspective, if not progressing may need SNF.  (Notified MD and RN) Pt currently with functional limitations due to the deficits listed below (see PT Problem List). Pt will benefit from skilled PT to increase their independence and safety with mobility to allow discharge to the venue listed below.          Recommendations for follow up therapy are one component of a multi-disciplinary discharge planning process, led by the attending physician.  Recommendations may be updated based on patient status, additional functional criteria and insurance authorization.  Follow Up Recommendations Other (comment) (Hopeful for HHPT but if not progressing may need SNF)    Assistance Recommended at Discharge Frequent or constant Supervision/Assistance  Functional Status Assessment Patient has had a recent decline in their functional status and demonstrates the ability to make significant improvements in function in a reasonable and predictable amount of time.   Equipment Recommendations  Rolling walker (2 wheels);3in1 (PT)    Recommendations for Other Services       Precautions / Restrictions Precautions Precautions: Fall      Mobility  Bed Mobility Overal bed mobility: Needs Assistance Bed Mobility: Supine to Sit;Sit to Supine     Supine to sit: Min assist Sit to supine: Mod assist   General bed mobility comments: increased time and cues ; assist to lift trunk and assist for legs back to bed    Transfers Overall transfer level: Needs assistance Equipment used: Rolling walker (2 wheels) Transfers: Sit to/from Stand Sit to Stand: Min assist;Max assist           General transfer comment: Min A and cues for hand placement from bed; Max A with hand rail from low toilet requiring 2 tries    Ambulation/Gait Ambulation/Gait assistance: Min assist Gait Distance (Feet): 100 Feet Assistive device: Rolling walker (2 wheels) Gait Pattern/deviations: Step-to pattern;Decreased stride length;Trunk flexed;Wide base of support Gait velocity: decraesed   General Gait Details: Trunk flexed with frequent cues for RW proximity; assist with RW for turns; min A for balance  Stairs Stairs: Yes Stairs assistance: Mod assist Stair Management: Step to pattern;Forwards;One rail Right Number of Stairs: 3 General stair comments: Up 3 steps forward with HHA and R rail; down 3 sideways; Pt requiring increased assist with fatigue (initially min A but then mod A)  Wheelchair Mobility    Modified Rankin (Stroke Patients Only)       Balance Overall balance assessment: Needs assistance Sitting-balance support: No upper extremity supported Sitting balance-Leahy Scale: Good  Standing balance support: Bilateral upper extremity supported Standing balance-Leahy Scale: Poor Standing balance comment: requiring RW                             Pertinent Vitals/Pain Pain Assessment: 0-10 Pain Score: 5  Pain Location: L  chest/ribs Pain Descriptors / Indicators: Throbbing Pain Intervention(s): Limited activity within patient's tolerance;Monitored during session    [place] expects to be discharged to:: Private residence Living Arrangements: Spouse/significant other Available Help at Discharge: Available 24 hours/day Type of Home: House Home Access: Stairs to enter Entrance Stairs-Rails: None Entrance Stairs-Number of Steps: 1 Alternate Level Stairs-Number of Steps: 14 Home Layout: Two level;Bed/bath upstairs;1/2 bath on main level;Able to live on main level with bedroom/bathroom (Has pull out couch on first floor) Home Equipment: Cane - single point;Shower seat;Grab bars - tub/shower Additional Comments: Pt confused but did provide fairly accurate PLOF -missed some details (confirmed with signficant other)    Prior Function Prior Level of Function : Independent/Modified Independent             Mobility Comments: Normally ambulates without AD in community and performs stairs without difficulty ADLs Comments: Reports independent with ADLs (uses shower chair); Reports could do IADLs     Hand Dominance        Extremity/Trunk Assessment   Upper Extremity Assessment Upper Extremity Assessment: LUE deficits/detail;RUE deficits/detail RUE Deficits / Details: ROM WFL; MMT 4/5 LUE Deficits / Details: ROM WFL; MMT 4/5    Lower Extremity Assessment Lower Extremity Assessment: LLE deficits/detail;RLE deficits/detail RLE Deficits / Details: ROM WFL; MMT 4/5 LLE Deficits / Details: ROM WFL; MMT 4/5    Cervical / Trunk Assessment Cervical / Trunk Assessment: Kyphotic  Communication   Communication: No difficulties  Cognition Arousal/Alertness: Awake/alert Behavior During Therapy: WFL for tasks assessed/performed Overall Cognitive Status: Impaired/Different from baseline Area of Impairment: Attention;Following commands;Safety/judgement;Awareness;Problem solving                    Current Attention Level: Sustained   Following Commands: Follows one step commands with increased time;Follows one step commands inconsistently Safety/Judgement: Decreased awareness of deficits Awareness: Intellectual Problem Solving: Slow processing;Decreased initiation;Difficulty sequencing;Requires verbal cues;Requires tactile cues General Comments: Pt requiring repetition and increased time to respond and at times with inaccurate reponses.  Unable to state President and only able to name 2/3 animals that begin with C in increased time.  At end of session, stated he thought he saw his cat in room .        General Comments      Exercises     Assessment/Plan    PT Assessment Patient needs continued PT services  PT Problem List Decreased strength;Decreased mobility;Decreased safety awareness;Decreased range of motion;Decreased coordination;Decreased activity tolerance;Decreased cognition;Decreased balance;Decreased knowledge of use of DME       PT Treatment Interventions DME instruction;Therapeutic activities;Gait training;Therapeutic exercise;Patient/family education;Stair training;Balance training;Functional mobility training    PT Goals (Current goals can be found in the Care Plan section)  Acute Rehab PT Goals Patient Stated Goal: return home - regain mobility PT Goal Formulation: With patient/family Time For Goal Achievement: 12/23/20 Potential to Achieve Goals: Good    Frequency Min 3X/week   Barriers to discharge Inaccessible home environment      Co-evaluation               AM-PAC PT "6 Clicks" Mobility  Outcome Measure Help needed turning from your back to your  side while in a flat bed without using bedrails?: A Little Help needed moving from lying on your back to sitting on the side of a flat bed without using bedrails?: A Little Help needed moving to and from a bed to a chair (including a wheelchair)?: A Little Help needed standing up from a  chair using your arms (e.g., wheelchair or bedside chair)?: A Lot Help needed to walk in hospital room?: A Little Help needed climbing 3-5 steps with a railing? : A Lot 6 Click Score: 16    End of Session Equipment Utilized During Treatment: Gait belt Activity Tolerance: Patient tolerated treatment well Patient left: in bed;with call bell/phone within reach;with bed alarm set Nurse Communication: Mobility status PT Visit Diagnosis: Unsteadiness on feet (R26.81);Muscle weakness (generalized) (M62.81);Other abnormalities of gait and mobility (R26.89)    Time: 5146-0479 PT Time Calculation (min) (ACUTE ONLY): 39 min   Charges:   PT Evaluation $PT Eval Moderate Complexity: 1 Mod PT Treatments $Gait Training: 8-22 mins $Therapeutic Activity: 8-22 mins        Abran Richard, PT Acute Rehab Services Pager [phone] [facility] [place] 12/09/2020, 2:32 PM

## 2020-12-09 NOTE — Progress Notes (Signed)
Patient holding onto food in his mouth at dinner this evening. Sister states that patient does not eat at home and that he will hold food frequently in his mouth. Sister is concerned that patient may aspirate at home on regular diet.

## 2020-12-09 NOTE — Progress Notes (Signed)
Ambulated patient in the hallway. Patient was able to ambulate 153ft with moderate assistance. Patient was a max assist to go from sit to stand position. Patient placed in chair at this time. Will ambulate again after dinner.

## 2020-12-10 DIAGNOSIS — N390 Urinary tract infection, site not specified: Secondary | ICD-10-CM | POA: Diagnosis not present

## 2020-12-10 LAB — GLUCOSE, CAPILLARY
Glucose-Capillary: 188 mg/dL — ABNORMAL HIGH (ref 70–99)
Glucose-Capillary: 191 mg/dL — ABNORMAL HIGH (ref 70–99)
Glucose-Capillary: 177 mg/dL — ABNORMAL HIGH (ref 70–99)

## 2020-12-10 MED ORDER — FENTANYL 50 MCG/HR TD PT72
1.0000 | MEDICATED_PATCH | TRANSDERMAL | Status: DC
  Administered 2020-12-10: 1 via TRANSDERMAL

## 2020-12-10 MED ORDER — HEPARIN SOD (PORK) LOCK FLUSH 100 UNIT/ML IV SOLN
500.0000 [IU] | INTRAVENOUS | Status: AC | PRN
  Administered 2020-12-10: 500 [IU]
  Filled 2020-12-10: qty 5

## 2020-12-10 NOTE — Evaluation (Signed)
Occupational Therapy Evaluation Patient Details Name: Stephen Lynch MRN: [id number] DOB: November 08, 1945 Today's Date: 12/10/2020   History of Present Illness Pt is 75 yo male with current hx of advanced pancreatic CA with bone metastases who presented on 12/07/20 with burning urination and AMS.  Pt admitted with UTI and metabolic encephalopathy.  Pt also with DM, HTN, HLD, and anemia.   Clinical Impression   Patient is a 75 year old male who was admitted for above. Patient was previously independent in ADLs. Currently, patient is max A for transfers and LB dressing/bathing tasks. Patient was noted to have increased confusion and difficulty word finding during session with nursing made aware. Patient was noted have had a significant decline in function impacting standing balanace, strength, functional activity tolerance, and safety awareness. Patient would continue to benefit from skilled OT services at this time while admitted and after d/c to address noted deficits in order to improve overall safety and independence in ADLs.       Recommendations for follow up therapy are one component of a multi-disciplinary discharge planning process, led by the attending physician.  Recommendations may be updated based on patient status, additional functional criteria and insurance authorization.   Follow Up Recommendations  Skilled nursing-short term rehab (<3 hours/day) (v.s. home with 24/7 care/physical assist pending family support)    Assistance Recommended at Discharge Frequent or constant Supervision/Assistance  Functional Status Assessment  Patient has had a recent decline in their functional status and demonstrates the ability to make significant improvements in function in a reasonable and predictable amount of time.  Equipment Recommendations  None recommended by OT    Recommendations for Other Services       Precautions / Restrictions Precautions Precautions: Fall Restrictions Weight  Bearing Restrictions: No      Mobility Bed Mobility Overal bed mobility: Needs Assistance Bed Mobility: Supine to Sit;Sit to Supine     Supine to sit: Min assist     General bed mobility comments: with increased time. patient needed physical assit to get trunk up to midline    Transfers Overall transfer level: Needs assistance Equipment used: Rolling walker (2 wheels) Transfers: Sit to/from Stand Sit to Stand: Max assist           General transfer comment: patient needed max A for sit to stand from edge of bed with elevated surface with max multimodal cues for proper hand placement.      Balance Overall balance assessment: Needs assistance Sitting-balance support: No upper extremity supported Sitting balance-Leahy Scale: Good     Standing balance support: Bilateral upper extremity supported Standing balance-Leahy Scale: Poor Standing balance comment: patient was noted to have lean to L side with inital standing with patient able to correct with external support and multimodal cues.                           ADL either performed or assessed with clinical judgement   ADL Overall ADL's : Needs assistance/impaired Eating/Feeding: Set up;Sitting;Minimal assistance Eating/Feeding Details (indicate cue type and reason): with increased time to get drink to mouth to have small sips Grooming: Wash/dry face;Sitting Grooming Details (indicate cue type and reason): EOB with increased time and cues to complete task Upper Body Bathing: Minimal assistance;Sitting Upper Body Bathing Details (indicate cue type and reason): cues to sequence all areas are reached. Lower Body Bathing: Maximal assistance;Sitting/lateral leans;Sit to/from stand Lower Body Bathing Details (indicate cue type and reason): patient was max  A for LB bathing and dressing tasks on this date. Upper Body Dressing : Minimal assistance;Sitting   Lower Body Dressing: Maximal assistance;Sit to/from  stand;Sitting/lateral leans   Toilet Transfer: Maximal assistance;Rolling walker (2 wheels) Toilet Transfer Details (indicate cue type and reason): patient was max A for sit to stand with RW with education on proper hand placement and sequencing of task to transfer from edge of bed to recliner in room. Toileting- Clothing Manipulation and Hygiene: Maximal assistance;Sit to/from stand       Functional mobility during ADLs: Maximal assistance;Rolling walker (2 wheels) General ADL Comments: patients BP sitting on edge of bed was 127/70 mmhg. patients blood pressure in standing was 125/62 mmhg.     Vision   Additional Comments: hard to assess with patients current cogntiive status.     Perception     Praxis      Pertinent Vitals/Pain Pain Assessment: Faces Faces Pain Scale: Hurts even more Pain Location: trunk Pain Descriptors / Indicators: Discomfort;Grimacing;Other (Comment) (patient's facial expression upon sitting on edge of bed.) Pain Intervention(s): Limited activity within patient's tolerance;Monitored during session     Hand Dominance Right   Extremity/Trunk Assessment Upper Extremity Assessment Upper Extremity Assessment: RUE deficits/detail;LUE deficits/detail RUE Deficits / Details: patients ROM was [facility]. strength was noted to be 4/5 LUE Deficits / Details: patients ROM was [facility]. strength was noted to be 4/5   Lower Extremity Assessment Lower Extremity Assessment: Defer to PT evaluation   Cervical / Trunk Assessment Cervical / Trunk Assessment: Kyphotic   Communication Communication Communication: No difficulties   Cognition Arousal/Alertness: Awake/alert Behavior During Therapy: WFL for tasks assessed/performed Overall Cognitive Status: Impaired/Different from baseline Area of Impairment: Attention;Following commands;Safety/judgement;Awareness;Problem solving                       Following Commands: Follows one step commands with increased  time;Follows one step commands inconsistently     Problem Solving: Slow processing;Decreased initiation;Difficulty sequencing;Requires verbal cues;Requires tactile cues General Comments: patients wife was called during session with wife reporting that patient's cognitition is not at baseline. patient was only oriented to self at this time. patient was unable to think of items he wanted with various cues provided. patient took increased time to comminucate with verbal and hand signals that he wanted nursing to check blood sugar. nurse was contacted for such as soon as patients desire was obtained.     General Comments       Exercises     Shoulder Instructions      Home Living Family/patient expects to be discharged to:: Private residence Living Arrangements: Spouse/significant other Available Help at Discharge: Available 24 hours/day (wife is home most of time but someone is there if not) Type of Home: House Home Access: Stairs to enter [employer] of Steps: 1 Entrance Stairs-Rails: None Home Layout: Two level;Bed/bath upstairs;1/2 bath on main level;Able to live on main level with bedroom/bathroom Alternate Level Stairs-Number of Steps: 14 Alternate Level Stairs-Rails: Right Bathroom Shower/Tub: Teacher, early years/pre: Standard     Home Equipment: Cane - single point;Shower seat;Grab bars - tub/shower          Prior Functioning/Environment Prior Level of Function : Independent/Modified Independent             Mobility Comments: Normally ambulates without AD in community and performs stairs without difficulty ADLs Comments: Reports independent with ADLs (uses shower chair); Reports could do IADLs. patients wife was called to obtain PLOF on this  date.        OT Problem List: Decreased strength;Decreased range of motion;Decreased activity tolerance;Impaired balance (sitting and/or standing);Decreased safety awareness;Decreased knowledge of  precautions;Decreased knowledge of use of DME or AE;Decreased cognition;Decreased coordination      OT Treatment/Interventions: Self-care/ADL training;Therapeutic exercise;Neuromuscular education;Energy conservation;DME and/or AE instruction;Therapeutic activities;Balance training;Patient/family education    OT Goals(Current goals can be found in the care plan section) Acute Rehab OT Goals Patient Stated Goal: none stated OT Goal Formulation: With patient Time For Goal Achievement: 12/24/20 Potential to Achieve Goals: Good  OT Frequency: Min 2X/week   Barriers to D/C:    patients wife is not at home all the time       Co-evaluation              AM-PAC OT "6 Clicks" Daily Activity     Outcome Measure Help from another person eating meals?: A Little Help from another person taking care of personal grooming?: A Little Help from another person toileting, which includes using toliet, bedpan, or urinal?: A Lot Help from another person bathing (including washing, rinsing, drying)?: A Lot Help from another person to put on and taking off regular upper body clothing?: A Little Help from another person to put on and taking off regular lower body clothing?: A Lot 6 Click Score: 15   End of Session Equipment Utilized During Treatment: Gait belt;Rolling walker (2 wheels) Nurse Communication: Mobility status  Activity Tolerance: Patient tolerated treatment well Patient left: in chair;with call bell/phone within reach;with chair alarm set  OT Visit Diagnosis: Unsteadiness on feet (R26.81);Muscle weakness (generalized) (M62.81);Pain                Time: 6629-4765 OT Time Calculation (min): 39 min Charges:  OT General Charges $OT Visit: 1 Visit OT Evaluation $OT Eval Moderate Complexity: 1 Mod OT Treatments $Self Care/Home Management : 23-37 mins  Jackelyn Poling OTR/L, MS Acute Rehabilitation Department Office# [phone] Pager# [phone]   [place] 12/10/2020,  1:39 PM

## 2020-12-10 NOTE — Plan of Care (Signed)

## 2020-12-10 NOTE — TOC Transition Note (Signed)
Transition of Care [facility]) - CM/SW Discharge Note   Patient Details  Name: Stephen Lynch MRN: [id number] Date of Birth: May 30, 1945  [facility] CM/SW Contact:  Ross Ludwig, LCSW Phone Number: 12/10/2020, 12:22 PM   Clinical Narrative:     CSW spoke to patient's wife to discuss home health agency.   She did not have a preference for agencies.  CSW contacted [facility], they can accept patient.  Patient will be going home with home health through [facility].  CSW signing off please reconsult with any other social work needs, home health agency has been notified of planned discharge.  Patient also will need a 3 in 1, CSW spoke to [place] and they will deliver before patient leaves.    Final next level of care: [place] Barriers to Discharge: Barriers Resolved   Patient Goals and CMS Choice Patient states their goals for this hospitalization and ongoing recovery are:: To return back home with home health. CMS Medicare.gov Compare Post Acute Care list provided to:: Patient Represenative (must comment) Choice offered to / list presented to : Spouse  Discharge Placement  Patient discharging home with home health today.                     Discharge Plan and Services     Post Acute Care Choice: Home Health, Durable Medical Equipment          DME Arranged: 3-N-1 DME Agency: [facility] Date DME Agency Contacted: 12/10/20 Time DME Agency Contacted: 1220 Representative spoke with at DME Agency: [place]: PT, OT, RN, Nurse's Aide [place] Agency: [place] Date [place]: 12/10/20 Time [place]: 1222 Representative spoke with at [place]: [place] Determinants of Health ([place]) Interventions     Readmission Risk Interventions No flowsheet data found.

## 2020-12-10 NOTE — Progress Notes (Signed)
Physical Therapy Treatment Patient Details Name: Stephen Lynch MRN: [id number] DOB: [birth year] Today's Date: 12/10/2020   History of Present Illness Pt is 75 yo male with current hx of advanced pancreatic CA with bone metastases who presented on 12/07/20 with burning urination and AMS.  Pt admitted with UTI and metabolic encephalopathy.  Pt also with DM, HTN, HLD, and anemia.    PT Comments    Pt with noted delayed processing, tangential thinking and fatigues easily but very cooperative and up with min assist to ambulate limited distance in hall.   Recommendations for follow up therapy are one component of a multi-disciplinary discharge planning process, led by the attending physician.  Recommendations may be updated based on patient status, additional functional criteria and insurance authorization.  Follow Up Recommendations  Other (comment)     Assistance Recommended at Discharge Frequent or constant Supervision/Assistance  Equipment Recommendations  Rolling walker (2 wheels);3in1 (PT)    Recommendations for Other Services       Precautions / Restrictions Precautions Precautions: Fall Restrictions Weight Bearing Restrictions: No     Mobility  Bed Mobility Overal bed mobility: Needs Assistance Bed Mobility: Supine to Sit;Sit to Supine     Supine to sit: Min assist     General bed mobility comments: Pt up in chair and returned to same    Transfers Overall transfer level: Needs assistance Equipment used: Rolling walker (2 wheels) Transfers: Sit to/from Stand Sit to Stand: Min assist           General transfer comment: cues for scoot forward and use of UEs to self assist    Ambulation/Gait Ambulation/Gait assistance: Min assist Gait Distance (Feet): 26 Feet Assistive device: Rolling walker (2 wheels) Gait Pattern/deviations: Step-to pattern;Decreased stride length;Trunk flexed Gait velocity: decreased   General Gait Details: cues for posture, position  from RW and safety awareness   Stairs Stairs: Yes Stairs assistance: Min assist Stair Management: No rails;Step to pattern;Forwards Number of Stairs: 1 General stair comments: single step with RW and steady assist   Wheelchair Mobility    Modified Rankin (Stroke Patients Only)       Balance Overall balance assessment: Needs assistance Sitting-balance support: No upper extremity supported Sitting balance-Leahy Scale: Good     Standing balance support: No upper extremity supported Standing balance-Leahy Scale: Fair Standing balance comment: patient was noted to have lean to L side with inital standing with patient able to correct with external support and multimodal cues.                            Cognition Arousal/Alertness: Awake/alert Behavior During Therapy: Flat affect;WFL for tasks assessed/performed Overall Cognitive Status: Impaired/Different from baseline Area of Impairment: Attention;Following commands;Safety/judgement;Awareness;Problem solving                       Following Commands: Follows one step commands with increased time Safety/Judgement: Decreased awareness of deficits   Problem Solving: Slow processing;Decreased initiation;Difficulty sequencing;Requires verbal cues;Requires tactile cues General Comments: patients wife was called during session with wife reporting that patient's cognitition is not at baseline. patient was only oriented to self at this time. patient was unable to think of items he wanted with various cues provided. patient took increased time to comminucate with verbal and hand signals that he wanted nursing to check blood sugar. nurse was contacted for such as soon as patients desire was obtained.        Exercises  General Comments        Pertinent Vitals/Pain Pain Assessment: No/denies pain Faces Pain Scale: Hurts even more Pain Location: trunk Pain Descriptors / Indicators: Discomfort;Grimacing;Other  (Comment) (patient's facial expression upon sitting on edge of bed.) Pain Intervention(s): Limited activity within patient's tolerance;Monitored during session    Home Living                          Prior Function            PT Goals (current goals can now be found in the care plan section) Acute Rehab PT Goals Patient Stated Goal: return home - regain mobility PT Goal Formulation: With patient/family Time For Goal Achievement: 12/23/20 Potential to Achieve Goals: Good Progress towards PT goals: Progressing toward goals    Frequency    Min 3X/week      PT Plan Current plan remains appropriate    Co-evaluation              AM-PAC PT "6 Clicks" Mobility   Outcome Measure  Help needed turning from your back to your side while in a flat bed without using bedrails?: A Little Help needed moving from lying on your back to sitting on the side of a flat bed without using bedrails?: A Little Help needed moving to and from a bed to a chair (including a wheelchair)?: A Little Help needed standing up from a chair using your arms (e.g., wheelchair or bedside chair)?: A Lot Help needed to walk in hospital room?: A Little Help needed climbing 3-5 steps with a railing? : A Lot 6 Click Score: 16    End of Session Equipment Utilized During Treatment: Gait belt Activity Tolerance: Patient limited by fatigue Patient left: in chair;with call bell/phone within reach;with chair alarm set Nurse Communication: Mobility status PT Visit Diagnosis: Unsteadiness on feet (R26.81);Muscle weakness (generalized) (M62.81);Other abnormalities of gait and mobility (R26.89)     Time: 1305-1330 PT Time Calculation (min) (ACUTE ONLY): 25 min  Charges:  $Gait Training: 8-22 mins                     [place] Pager [phone] Office [phone]    Stephen Lynch 12/10/2020, 3:32 PM

## 2020-12-10 NOTE — Progress Notes (Signed)
Nutrition Brief Note  Patient identified on the Malnutrition Screening Tool (MST) Report  Wt Readings from Last 15 Encounters:  12/07/20 105 kg  11/23/20 104.8 kg  11/15/20 110.2 kg  11/02/20 105.6 kg  10/12/20 111.7 kg  10/04/20 108.9 kg  09/21/20 110 kg  08/31/20 107.3 kg  08/23/20 104.5 kg  08/10/20 103.8 kg  07/24/20 101.6 kg  07/24/20 102 kg  07/20/20 101.6 kg  06/29/20 98 kg  06/26/20 97 kg   Stephen Lynch is a 75 y.o. male with medical history significant for advanced pancreatic cancer with bone metastases, insulin-dependent diabetes, HTN, HLD, anemia of chronic disease who presented to the ED for evaluation of dysuria and altered mental status.  History is supplemented by patient's significant other at bedside.  Pt admitted with complicated UTI.   Pt unavailable at time of visit. Attempted to speak with pt via call to hospital room phone, however, unable to reach. RD unable to obtain further nutrition-related history or complete nutrition-focused physical exam at this time.    Reviewed wt hx; wt has been stable over the past 3 months.   Noted plan to discharge home today.  Current diet order is heart healthy, carb modified, patient is consuming approximately 25-100%% of meals at this time. Labs and medications reviewed.   No nutrition interventions warranted at this time. If nutrition issues arise, please consult RD.   Loistine Chance, RD, LDN, [place] Registered Dietitian II Certified Diabetes Care and Education Specialist Please refer to [facility] for RD and/or RD on-call/weekend/after hours pager

## 2020-12-11 ENCOUNTER — Telehealth: Payer: Self-pay

## 2020-12-11 DIAGNOSIS — E119 Type 2 diabetes mellitus without complications: Secondary | ICD-10-CM | POA: Diagnosis not present

## 2020-12-11 DIAGNOSIS — I10 Essential (primary) hypertension: Secondary | ICD-10-CM | POA: Diagnosis not present

## 2020-12-11 DIAGNOSIS — E7849 Other hyperlipidemia: Secondary | ICD-10-CM | POA: Diagnosis not present

## 2020-12-11 NOTE — Telephone Encounter (Signed)
Spoke with [name], pt's partner who states pt was d/c from hospital and is still confused. Marita Snellen to encourage fluids, food and take abx as directed.Pt still c/o pain and [name] asked by pt's Dilaudid was d/c. Marita Snellen I would route her message to Dr Alen Blew to see if he would like to see pt sooner than 11/11. [name] verbalized thanks and understanding.

## 2020-12-12 DIAGNOSIS — Z7982 Long term (current) use of aspirin: Secondary | ICD-10-CM | POA: Diagnosis not present

## 2020-12-12 DIAGNOSIS — C61 Malignant neoplasm of prostate: Secondary | ICD-10-CM | POA: Diagnosis not present

## 2020-12-12 DIAGNOSIS — E119 Type 2 diabetes mellitus without complications: Secondary | ICD-10-CM | POA: Diagnosis not present

## 2020-12-12 DIAGNOSIS — F0390 Unspecified dementia without behavioral disturbance: Secondary | ICD-10-CM | POA: Diagnosis not present

## 2020-12-12 DIAGNOSIS — B9620 Unspecified Escherichia coli [E. coli] as the cause of diseases classified elsewhere: Secondary | ICD-10-CM | POA: Diagnosis not present

## 2020-12-12 DIAGNOSIS — I10 Essential (primary) hypertension: Secondary | ICD-10-CM | POA: Diagnosis not present

## 2020-12-12 DIAGNOSIS — G9341 Metabolic encephalopathy: Secondary | ICD-10-CM | POA: Diagnosis not present

## 2020-12-12 DIAGNOSIS — D630 Anemia in neoplastic disease: Secondary | ICD-10-CM | POA: Diagnosis not present

## 2020-12-12 DIAGNOSIS — C7951 Secondary malignant neoplasm of bone: Secondary | ICD-10-CM | POA: Diagnosis not present

## 2020-12-12 DIAGNOSIS — N390 Urinary tract infection, site not specified: Secondary | ICD-10-CM | POA: Diagnosis not present

## 2020-12-12 DIAGNOSIS — Z794 Long term (current) use of insulin: Secondary | ICD-10-CM | POA: Diagnosis not present

## 2020-12-12 DIAGNOSIS — E785 Hyperlipidemia, unspecified: Secondary | ICD-10-CM | POA: Diagnosis not present

## 2020-12-12 LAB — CULTURE, BLOOD (ROUTINE X 2)
Culture: NO GROWTH
Special Requests: ADEQUATE

## 2020-12-13 ENCOUNTER — Other Ambulatory Visit: Payer: Self-pay

## 2020-12-13 ENCOUNTER — Other Ambulatory Visit: Payer: Self-pay | Admitting: *Deleted

## 2020-12-13 ENCOUNTER — Other Ambulatory Visit: Payer: Medicare Other | Admitting: Hospice

## 2020-12-13 DIAGNOSIS — Z515 Encounter for palliative care: Secondary | ICD-10-CM

## 2020-12-13 DIAGNOSIS — C61 Malignant neoplasm of prostate: Secondary | ICD-10-CM | POA: Diagnosis not present

## 2020-12-13 DIAGNOSIS — C7951 Secondary malignant neoplasm of bone: Secondary | ICD-10-CM | POA: Diagnosis not present

## 2020-12-13 DIAGNOSIS — R52 Pain, unspecified: Secondary | ICD-10-CM

## 2020-12-13 DIAGNOSIS — K5901 Slow transit constipation: Secondary | ICD-10-CM | POA: Diagnosis not present

## 2020-12-13 LAB — CULTURE, BLOOD (ROUTINE X 2): Special Requests: ADEQUATE

## 2020-12-13 NOTE — Patient Outreach (Signed)
[facility]) Care Management  12/13/2020  Stephen Lynch 07-08-1945 [id number]   RED ON EMMI ALERT - General Discharge Day # 1 Date: 11/1 Red Alert Reason: Did not schedule follow up appointment   Member report this is not a good time to talk, state he will call back.    Plan: RN CM will await call back, if no call back will follow up within the next week as planned.  [name], [place], MSN [name] [phone]

## 2020-12-13 NOTE — Progress Notes (Signed)
[facility] Consult Note Telephone: [phone]  Fax: [phone]  PATIENT NAME: Shahid Flori [address] [place] [address] [phone] (home)  DOB: [birth year] MRN: [id number]  PRIMARY CARE PROVIDER:    [name], [place],  [address] [place] [address] [phone]  REFERRING PROVIDER:   [name], [place],  [place] [address] [phone]  RESPONSIBLE PARTY:   Self [name] is best to call  Contact Information     Name Relation Home Work Mobile   Adel other [phone]  [phone]        I met face to face with patient and family at home. Palliative Care was asked to follow this patient by consultation request of  [name], MD to address advance care planning, complex medical decision making and goals of care clarification. [name] and patent's 2 sisters are at home with patient during visit. Santiago Glad DNP student shadowing. This is the initial visit.    ASSESSMENT AND / RECOMMENDATIONS:   Advance Care Planning: Our advance care planning conversation included a discussion about:    The value and importance of advance care planning  Difference between Hospice and Palliative care Exploration of goals of care in the event of a sudden injury or illness  Identification and preparation of a healthcare agent  Review and updating or creation of an  advance directive document . Decision not to resuscitate or to de-escalate disease focused treatments due to poor prognosis.  CODE STATUS: Extensive discussion on ramifications and implications of CODE STATUS.  Patient elects partial code today.  He desires chest compressions with ACLS medications, no intubation/mechanical ventilation.  Goals of Care: Goals include to maximize quality of life and symptom management.  Patient is open to hospice services in the future when it is warranted.  I spent 46 minutes providing this  initial consultation. More than 50% of the time in this consultation was spent on counseling patient and coordinating communication. --------------------------------------------------------------------------------------------------------------------------------------  Symptom Management/Plan: Metastatic Prostate cancer: Completed chemo last week with plan for possible radiation.  Follow-up with oncologist as planned. Weakness: : PT/OT in process.  Generalized Pain: Managed with Fentanyl patch and Oxycodone. Constipation: Managed with MiraLAX Type 2 DM: Recent A1c in July 2022 is  8.7% down from 10.2% May 2022.  Continue insulin and Tresiba as ordered.  Education on no concentrated sweets.  Repeat A1c, routine CBC CMP.  Follow up: Palliative care will continue to follow for complex medical decision making, advance care planning, and clarification of goals. Return 6 weeks or prn.Encouraged to call provider sooner with any concerns.   Family /Caregiver/Community Supports: Patient lives at home with his fiance who is involved in his care.  Strong family support system identified.  HOSPICE ELIGIBILITY/DIAGNOSIS: TBD  Chief Complaint: Initial Palliative care visit  HISTORY OF PRESENT ILLNESS:  Irfan Veal is a 75 y.o. year old male  with multiple medical conditions including metastatic prostate cancer to the bone.  Patient completed chemo with plan for possible radiation in the near future.  Patient endorses weakness from all the chemo treatments and from hospitalization for acute metabolic encephalopathy related to UTI 10/27- 12/10/2020, treated and discharged home with Home health PT/OT.  History of type 2 diabetes mellitus, back pain, COVID-19 viral infection. History obtained from review of EMR, discussion with primary team, caregiver, family and/or Mr. Lanni.  Review and summarization of Epic records shows history from other than patient. Rest of  10 point ROS asked and negative.     Review of  lab tests/diagnostics   Recent Labs  Lab 12/07/20 1812 12/08/20 0431  WBC 11.9* 10.8*  HGB 7.7* 8.2*  HCT 25.1* 26.8*  PLT 482* 470*  MCV 90.6 91.8   Recent Labs  Lab 12/07/20 1812 12/08/20 0431 12/09/20 0513  NA 134* 134* 134*  K 4.5 4.4 4.6  CL 98 97* 97*  CO2 27 28 28   BUN 20 19 21   CREATININE 0.91 0.77 0.70  GLUCOSE 169* 179* 265*    Recent Labs  Lab 12/07/20 1812  AST 52*  ALT 38  ALKPHOS 270*    ROS General: NAD EYES: denies vision changes ENMT: denies dysphagia Cardiovascular: denies chest pain/discomfort Pulmonary: denies cough, denies SOB Abdomen: endorses good appetite, denies constipation/diarrhea GU: denies dysuria, urinary frequency MSK:  endorses weakness,  no falls reported Skin: denies rashes or wounds Neurological: denies pain, denies insomnia Psych: Endorses positive mood Heme/lymph/immuno: denies bruises, abnormal bleeding  Physical Exam: Height/Weight: 6 feet 1 inch/ 200 Ibs Constitutional: NAD General: Well groomed, cooperative EYES: anicteric sclera, lids intact, no discharge  ENMT: Moist mucous membrane CV: S1 S2, RRR, no LE edema Pulmonary: LCTA, no increased work of breathing, no cough, Abdomen: active BS + 4 quadrants, soft and non tender GU: no suprapubic tenderness MSK: weakness, ambulatory with rolling walker Skin: warm and dry, no rashes or wounds on visible skin Neuro:  weakness, otherwise non focal, mild confusion/memory loss Psych: non-anxious affect Hem/lymph/immuno: no widespread bruising   PAST MEDICAL HISTORY:  Active Ambulatory Problems    Diagnosis Date Noted   Back pain 03/23/2019   Hypertension    Dyslipidemia    Diabetes mellitus without complication (HCC)    Symptomatic anemia 03/23/2019   COVID-19 virus infection 03/23/2019   Dilation of biliary tract 03/23/2019   COPD (chronic obstructive pulmonary disease) ([place]) 03/23/2019   Prostate cancer ([place]) 04/13/2019   Goals of care, counseling/discussion  07/12/5613   Complicated UTI (urinary tract infection) 37/94/3276   Acute metabolic encephalopathy 14/70/9295   Resolved Ambulatory Problems    Diagnosis Date Noted   Metastasis to spinal column ([facility]) 03/23/2019   Past Medical History:  Diagnosis Date   Cancer ([place])     SOCIAL HX:  Social History   Tobacco Use   Smoking status: Former    Packs/day: 0.50    Years: 15.00    Pack years: 7.50    Types: Cigarettes   Smokeless tobacco: Never  Substance Use Topics   Alcohol use: Not Currently    Comment: h/o heavy use, quit about 2010     FAMILY HX:  Family History  Problem Relation Age of Onset   CAD Mother 56   Other Father 36       age   Other Brother 71       unknown cause      ALLERGIES:  Allergies  Allergen Reactions   Chlorhexidine       PERTINENT MEDICATIONS:  Outpatient Encounter Medications as of 12/13/2020  Medication Sig   ASPIRIN 81 PO Take 81 mg by mouth daily.    Calcium-Magnesium-Zinc 333-133-5 MG TABS Take 1 tablet by mouth daily.   cephALEXin (KEFLEX) 500 MG capsule Take 1 capsule (500 mg total) by mouth 4 (four) times daily for 5 days.   Chromium 1 MG CAPS Take by mouth.   Continuous Blood Gluc Sensor (FREESTYLE LIBRE 2 SENSOR) MISC APPLY EVERY 14 DAYS, CHECK GLUCOSE BEFORE EACH MEAL, 2  HRS AFTER, IN THE MORNING AND BEFORE BEDTIME   DULoxetine (CYMBALTA) 60 MG capsule Take 60 mg by mouth daily.   fentaNYL (DURAGESIC) 50 MCG/HR Place 1 patch onto the skin every 3 (three) days.   insulin lispro (HUMALOG) 100 UNIT/ML KwikPen Junior Inject 0.2 mLs (20 Units total) into the skin 3 (three) times daily after meals. (Patient taking differently: Inject 7-25 Units into the skin 3 (three) times daily after meals. Per sliding scale)   lidocaine (LIDODERM) 5 % Place 2 patches onto the skin daily. Remove & Discard patch within 12 hours or as directed by MD   lidocaine-prilocaine (EMLA) cream Apply 1 application topically as needed. (Patient not taking: No sig  reported)   loperamide (IMODIUM) 1 MG/5ML solution Take 2 mg by mouth daily as needed for diarrhea or loose stools.   magnesium 30 MG tablet Take 30 mg by mouth 2 (two) times daily.   Menthol-Camphor (TIGER BALM ARTHRITIS RUB EX) Apply 1 application topically 2 (two) times daily as needed (back pain).   ondansetron (ZOFRAN) 4 MG tablet TAKE 1 TABLET BY MOUTH EVERY 6 HOURS AS NEEDED. FOR NAUSEA (Patient taking differently: Take 4 mg by mouth every 8 (eight) hours as needed for nausea or vomiting.)   oxyCODONE-acetaminophen (PERCOCET) 7.5-325 MG tablet Take 1 tablet by mouth 4 (four) times daily as needed. (Patient taking differently: Take 1 tablet by mouth 4 (four) times daily as needed for moderate pain.)   prochlorperazine (COMPAZINE) 10 MG tablet TAKE 1 TABLET BY MOUTH EVERY 6 HOURS AS NEEDED FOR NAUSEA OR VOMITING. (Patient not taking: No sig reported)   rosuvastatin (CRESTOR) 10 MG tablet Take 1 tablet (10 mg total) by mouth daily. (Patient taking differently: Take 10 mg by mouth every evening.)   TRESIBA FLEXTOUCH 100 UNIT/ML FlexTouch Pen Inject 36 Units into the skin at bedtime.   No facility-administered encounter medications on file as of 12/13/2020.     Thank you for the opportunity to participate in the care of Mr. Ambrocio.  The palliative care team will continue to follow. Please call our office at [phone] if we can be of additional assistance.   Note: Portions of this note were generated with [employer]. Dictation errors may occur despite best attempts at proofreading.  Teodoro Spray, NP

## 2020-12-14 ENCOUNTER — Encounter: Payer: Self-pay | Admitting: Oncology

## 2020-12-15 ENCOUNTER — Other Ambulatory Visit: Payer: Self-pay | Admitting: *Deleted

## 2020-12-15 ENCOUNTER — Telehealth: Payer: Self-pay | Admitting: *Deleted

## 2020-12-15 ENCOUNTER — Other Ambulatory Visit: Payer: Self-pay | Admitting: Oncology

## 2020-12-15 DIAGNOSIS — C61 Malignant neoplasm of prostate: Secondary | ICD-10-CM

## 2020-12-15 MED ORDER — HYDROMORPHONE HCL 4 MG PO TABS: 4.0000 mg | ORAL_TABLET | ORAL | 0 refills | Status: DC | PRN

## 2020-12-15 MED ORDER — OXYCODONE-ACETAMINOPHEN 7.5-325 MG PO TABS: 1.0000 | ORAL_TABLET | ORAL | 0 refills | Status: DC | PRN

## 2020-12-15 NOTE — Telephone Encounter (Signed)
-----   Message from [name], MD sent at 12/15/2020  4:05 PM EDT ----- Sure.  He can take Percocet every 6 and Dilaudid every 4. ----- Message ----- From: Rolene Course, RN Sent: 12/15/2020   3:46 PM EDT To: [name], MD  Is it ok for him to take the percocet as well?  If so, would you advise him alternating the Dilaudid & Percocet?  ----- Message ----- From: [name], MD Sent: 12/15/2020   3:36 PM EDT To: Rolene Course, RN  He needs to continue Fentanyl and use Dilaudid as needed.l I will send a new RX ----- Message ----- From: Rolene Course, RN Sent: 12/15/2020   3:06 PM EDT To: [name], MD  Mr Hasten's wife called, he is still having difficulty managing his pain.  He is taking his percocet every two hours but this is not helping at night.  She says he had a rx for dilaudid 4 mg before but this was DC'd when he was in the hospital recently.  He is also on a fentanyl patch.  She is asking for some clarification with his pain meds.  Please advise.  Thanks, Bethena Roys

## 2020-12-15 NOTE — Telephone Encounter (Signed)
PC to patient's caregiver, [name], informed her Dr. Alen Blew has sent in rx for Dilaudid.  Instructed her patient may take percocet every 6 hours and dilaudid every 4 hours & to use his fentanyl patch.  Informed her patient is not to take the percocet every 2 hours as instructed before because of the addition of another narcotic pain medication.  Informed her of the risk of respiratory depression if patient is taking too much narcotic pain medication.  She verbalizes understanding.

## 2020-12-18 ENCOUNTER — Other Ambulatory Visit: Payer: Self-pay | Admitting: *Deleted

## 2020-12-18 ENCOUNTER — Encounter (HOSPITAL_COMMUNITY): Payer: Self-pay

## 2020-12-18 ENCOUNTER — Other Ambulatory Visit: Payer: Self-pay

## 2020-12-18 ENCOUNTER — Inpatient Hospital Stay: Payer: Medicare Other | Attending: Oncology

## 2020-12-18 ENCOUNTER — Other Ambulatory Visit: Payer: Self-pay | Admitting: Oncology

## 2020-12-18 ENCOUNTER — Ambulatory Visit (HOSPITAL_COMMUNITY)
Admission: RE | Admit: 2020-12-18 | Discharge: 2020-12-18 | Disposition: A | Discharge status: Home / Self Care | Payer: Medicare Other | Source: Ambulatory Visit | Attending: Oncology | Admitting: Oncology

## 2020-12-18 DIAGNOSIS — C7951 Secondary malignant neoplasm of bone: Secondary | ICD-10-CM | POA: Insufficient documentation

## 2020-12-18 DIAGNOSIS — D6481 Anemia due to antineoplastic chemotherapy: Secondary | ICD-10-CM | POA: Insufficient documentation

## 2020-12-18 DIAGNOSIS — C61 Malignant neoplasm of prostate: Secondary | ICD-10-CM | POA: Diagnosis not present

## 2020-12-18 DIAGNOSIS — Z7982 Long term (current) use of aspirin: Secondary | ICD-10-CM | POA: Insufficient documentation

## 2020-12-18 DIAGNOSIS — Z5189 Encounter for other specified aftercare: Secondary | ICD-10-CM | POA: Insufficient documentation

## 2020-12-18 DIAGNOSIS — D649 Anemia, unspecified: Secondary | ICD-10-CM

## 2020-12-18 DIAGNOSIS — T451X5A Adverse effect of antineoplastic and immunosuppressive drugs, initial encounter: Secondary | ICD-10-CM | POA: Diagnosis not present

## 2020-12-18 DIAGNOSIS — Z79899 Other long term (current) drug therapy: Secondary | ICD-10-CM | POA: Diagnosis not present

## 2020-12-18 DIAGNOSIS — Z192 Hormone resistant malignancy status: Secondary | ICD-10-CM | POA: Diagnosis not present

## 2020-12-18 DIAGNOSIS — Z5111 Encounter for antineoplastic chemotherapy: Secondary | ICD-10-CM | POA: Insufficient documentation

## 2020-12-18 LAB — BASIC METABOLIC PANEL
Anion gap: 8 (ref 5–15)
BUN: 21 mg/dL (ref 8–23)
CO2: 27 mmol/L (ref 22–32)
Calcium: 8.1 mg/dL — ABNORMAL LOW (ref 8.9–10.3)
Chloride: 96 mmol/L — ABNORMAL LOW (ref 98–111)
Creatinine, Ser: 0.79 mg/dL (ref 0.61–1.24)
GFR, Estimated: >60 mL/min (ref >60)
Glucose, Bld: 191 mg/dL — ABNORMAL HIGH (ref 70–99)
Potassium: 4.3 mmol/L (ref 3.5–5.1)
Sodium: 131 mmol/L — ABNORMAL LOW (ref 135–145)

## 2020-12-18 LAB — CBC WITH DIFFERENTIAL/PLATELET
Abs Immature Granulocytes: 0.17 10*3/uL — ABNORMAL HIGH (ref 0.00–0.07)
Basophils Absolute: 0 10*3/uL (ref 0.0–0.1)
Basophils Relative: 0 %
Eosinophils Absolute: 0 10*3/uL (ref 0.0–0.5)
Eosinophils Relative: 0 %
HCT: 22.4 % — ABNORMAL LOW (ref 39.0–52.0)
Hemoglobin: 6.7 g/dL — CL (ref 13.0–17.0)
Immature Granulocytes: 2 %
Lymphocytes Relative: 13 %
Lymphs Abs: 1.1 10*3/uL (ref 0.7–4.0)
MCH: 26.9 pg (ref 26.0–34.0)
MCHC: 29.9 g/dL — ABNORMAL LOW (ref 30.0–36.0)
MCV: 90 fL (ref 80.0–100.0)
Monocytes Absolute: 0.7 10*3/uL (ref 0.1–1.0)
Monocytes Relative: 8 %
Neutro Abs: 6.7 10*3/uL (ref 1.7–7.7)
Neutrophils Relative %: 77 %
Platelets: 360 10*3/uL (ref 150–400)
RBC: 2.49 MIL/uL — ABNORMAL LOW (ref 4.22–5.81)
RDW: 20.4 % — ABNORMAL HIGH (ref 11.5–15.5)
WBC: 8.7 10*3/uL (ref 4.0–10.5)
nRBC: 0.3 % — ABNORMAL HIGH (ref 0.0–0.2)

## 2020-12-18 MED ORDER — SODIUM CHLORIDE 0.9 % IV SOLN: INTRAVENOUS | Status: DC

## 2020-12-18 NOTE — Written Directive (Addendum)
MOLECULAR IMAGING AND THERAPEUTICS WRITTEN DIRECTIVE   PATIENT NAME: Stephen Lynch  PT DOB:   July 16, 1945                                              MRN: [id number]  ---------------------------------------------------------------------------------------------------------------------   PLUVICTO  THERAPY   RADIOPHARMACEUTICAL: Lutetium 177 vipivotide tetraxetan (Pluvicto)     PRESCRIBED DOSE FOR ADMINISTRATION:  200 mCi   ROUTE OFADMINISTRATION:  IV   DIAGNOSIS:  Prostate Cancer    REFERRING PHYSICIAN:Dr. [id number]   TREATMENT #:1   ADDITIONAL PHYSICIAN COMMENTS/NOTES:   AUTHORIZED Felisa Bonier & TIME STAMP:John Melba Coon, MD   12/18/20    9:14 AM

## 2020-12-19 ENCOUNTER — Other Ambulatory Visit: Payer: Self-pay | Admitting: *Deleted

## 2020-12-19 ENCOUNTER — Telehealth: Payer: Self-pay | Admitting: Endocrinology

## 2020-12-19 ENCOUNTER — Telehealth: Payer: Self-pay | Admitting: Family Medicine

## 2020-12-19 DIAGNOSIS — C61 Malignant neoplasm of prostate: Secondary | ICD-10-CM

## 2020-12-19 LAB — PREPARE RBC (CROSSMATCH)

## 2020-12-19 NOTE — Telephone Encounter (Signed)
Wife called stating PT BS highs [phone] and request to be called [phone].

## 2020-12-19 NOTE — Telephone Encounter (Signed)
   Masaru Chamberlin DOB: December 29, 1945 MRN: [id number]   RIDER WAIVER AND RELEASE OF LIABILITY  For purposes of improving physical access to our facilities, [facility] is pleased to partner with third parties to provide [facility] patients or other authorized individuals the option of convenient, on-demand ground transportation services (the [place]") through use of the technology service that enables users to request on-demand ground transportation from independent third-party providers.  By opting to use and accept these [employer], I, the undersigned, hereby agree on behalf of myself, and on behalf of any minor child using the Government social research officer for whom I am the parent or legal guardian, as follows:  Government social research officer provided to me are provided by independent third-party transportation providers who are not [employer] or employees and who are unaffiliated with [employer]. [facility] is neither a transportation carrier nor a common or public carrier. [facility] has no control over the quality or safety of the transportation that occurs as a result of the [employer]. [facility] cannot guarantee that any third-party transportation provider will complete any arranged transportation service. [facility] makes no representation, warranty, or guarantee regarding the reliability, timeliness, quality, safety, suitability, or availability of any of the Transport Services or that they will be error free. I fully understand that traveling by vehicle involves risks and dangers of serious bodily injury, including permanent disability, paralysis, and death. I agree, on behalf of myself and on behalf of any minor child using the Transport Services for whom I am the parent or legal guardian, that the entire risk arising out of my use of the [employer] remains solely with me, to the maximum extent permitted under applicable law. The [employer] are provided "as  is" and "as available." [facility] disclaims all representations and warranties, express, implied or statutory, not expressly set out in these terms, including the implied warranties of merchantability and fitness for a particular purpose. I hereby waive and release [facility], its agents, employees, officers, directors, representatives, insurers, attorneys, assigns, successors, subsidiaries, and affiliates from any and all past, present, or future claims, demands, liabilities, actions, causes of action, or suits of any kind directly or indirectly arising from acceptance and use of the [employer]. I further waive and release [facility] and its affiliates from all present and future liability and responsibility for any injury or death to persons or damages to property caused by or related to the use of the [employer]. I have read this Waiver and Release of Liability, and I understand the terms used in it and their legal significance. This Waiver is freely and voluntarily given with the understanding that my right (as well as the right of any minor child for whom I am the parent or legal guardian using the [employer]) to legal recourse against  in connection with the [employer] is knowingly surrendered in return for use of these services.   I attest that I read the consent document to [name], gave Mr. Matton the opportunity to ask questions and answered the questions asked (if any). I affirm that [name] then provided consent for he's participation in this program.     Legrand Pitts, Patient wife gave consent on his behalf

## 2020-12-20 ENCOUNTER — Inpatient Hospital Stay: Payer: Medicare Other

## 2020-12-20 ENCOUNTER — Other Ambulatory Visit: Payer: Self-pay

## 2020-12-20 ENCOUNTER — Encounter: Payer: Self-pay | Admitting: *Deleted

## 2020-12-20 DIAGNOSIS — C61 Malignant neoplasm of prostate: Secondary | ICD-10-CM | POA: Diagnosis not present

## 2020-12-20 DIAGNOSIS — C7951 Secondary malignant neoplasm of bone: Secondary | ICD-10-CM | POA: Diagnosis not present

## 2020-12-20 DIAGNOSIS — Z192 Hormone resistant malignancy status: Secondary | ICD-10-CM | POA: Diagnosis not present

## 2020-12-20 DIAGNOSIS — D649 Anemia, unspecified: Secondary | ICD-10-CM

## 2020-12-20 DIAGNOSIS — T451X5A Adverse effect of antineoplastic and immunosuppressive drugs, initial encounter: Secondary | ICD-10-CM | POA: Diagnosis not present

## 2020-12-20 DIAGNOSIS — Z5111 Encounter for antineoplastic chemotherapy: Secondary | ICD-10-CM | POA: Diagnosis not present

## 2020-12-20 DIAGNOSIS — D6481 Anemia due to antineoplastic chemotherapy: Secondary | ICD-10-CM | POA: Diagnosis not present

## 2020-12-20 MED ORDER — DIPHENHYDRAMINE HCL 25 MG PO CAPS
25.0000 mg | ORAL_CAPSULE | Freq: Once | ORAL | Status: AC
  Administered 2020-12-20: 25 mg via ORAL

## 2020-12-20 MED ORDER — SODIUM CHLORIDE 0.9% IV SOLUTION
250.0000 mL | Freq: Once | INTRAVENOUS | Status: AC
  Administered 2020-12-20: 250 mL via INTRAVENOUS

## 2020-12-20 MED ORDER — ACETAMINOPHEN 325 MG PO TABS
650.0000 mg | ORAL_TABLET | Freq: Once | ORAL | Status: AC
  Administered 2020-12-20: 650 mg via ORAL

## 2020-12-20 NOTE — Progress Notes (Signed)
[place] [facility]  Clinical Social Work was referred by medical oncology RN for assessment of psychosocial needs.  Clinical Social Worker contacted caregiver by phone  to offer support and assess for needs.    Mrs. Bhardwaj shared she is having difficulty transporting patient to [facility] appointments. She has been enrolled in [facility] and feels this will be suitable option, although she has difficulty getting patient to the door for pick up.   Patient's spouse asked for home care resources to help care for patient in home. CSW explained home care is out of pocket expense and not aware of any free community resources.    Gwinda Maine, LCSW  Clinical Social Worker [facility]

## 2020-12-21 DIAGNOSIS — C7951 Secondary malignant neoplasm of bone: Secondary | ICD-10-CM | POA: Diagnosis not present

## 2020-12-21 DIAGNOSIS — D630 Anemia in neoplastic disease: Secondary | ICD-10-CM | POA: Diagnosis not present

## 2020-12-21 DIAGNOSIS — C61 Malignant neoplasm of prostate: Secondary | ICD-10-CM | POA: Diagnosis not present

## 2020-12-21 DIAGNOSIS — G9341 Metabolic encephalopathy: Secondary | ICD-10-CM | POA: Diagnosis not present

## 2020-12-21 DIAGNOSIS — F0390 Unspecified dementia without behavioral disturbance: Secondary | ICD-10-CM | POA: Diagnosis not present

## 2020-12-21 DIAGNOSIS — E119 Type 2 diabetes mellitus without complications: Secondary | ICD-10-CM | POA: Diagnosis not present

## 2020-12-21 LAB — TYPE AND SCREEN
ABO/RH(D): AB POS
Antibody Screen: NEGATIVE
Unit division: 0
Unit division: 0

## 2020-12-21 LAB — BPAM RBC
Blood Product Expiration Date: 202211222359
Blood Product Expiration Date: 202211222359
ISSUE DATE / TIME: 202211091104
ISSUE DATE / TIME: 202211091104
Unit Type and Rh: 6200
Unit Type and Rh: 6200

## 2020-12-22 ENCOUNTER — Inpatient Hospital Stay: Payer: Medicare Other

## 2020-12-22 ENCOUNTER — Other Ambulatory Visit: Payer: Self-pay | Admitting: *Deleted

## 2020-12-22 ENCOUNTER — Other Ambulatory Visit: Payer: Self-pay

## 2020-12-22 ENCOUNTER — Inpatient Hospital Stay (HOSPITAL_BASED_OUTPATIENT_CLINIC_OR_DEPARTMENT_OTHER): Payer: Medicare Other | Admitting: Oncology

## 2020-12-22 VITALS — BP 137/74 | HR 105 | Temp 97.5°F | Resp 17

## 2020-12-22 DIAGNOSIS — Z192 Hormone resistant malignancy status: Secondary | ICD-10-CM | POA: Diagnosis not present

## 2020-12-22 DIAGNOSIS — C61 Malignant neoplasm of prostate: Secondary | ICD-10-CM | POA: Diagnosis not present

## 2020-12-22 DIAGNOSIS — D649 Anemia, unspecified: Secondary | ICD-10-CM

## 2020-12-22 DIAGNOSIS — T451X5A Adverse effect of antineoplastic and immunosuppressive drugs, initial encounter: Secondary | ICD-10-CM | POA: Diagnosis not present

## 2020-12-22 DIAGNOSIS — Z5111 Encounter for antineoplastic chemotherapy: Secondary | ICD-10-CM | POA: Diagnosis not present

## 2020-12-22 DIAGNOSIS — C7951 Secondary malignant neoplasm of bone: Secondary | ICD-10-CM | POA: Diagnosis not present

## 2020-12-22 DIAGNOSIS — D6481 Anemia due to antineoplastic chemotherapy: Secondary | ICD-10-CM | POA: Diagnosis not present

## 2020-12-22 MED ORDER — FENTANYL 100 MCG/HR TD PT72: 1.0000 | MEDICATED_PATCH | TRANSDERMAL | 0 refills | Status: DC

## 2020-12-22 MED ORDER — HYDROMORPHONE HCL 4 MG PO TABS: 4.0000 mg | ORAL_TABLET | ORAL | 0 refills | Status: DC | PRN

## 2020-12-22 NOTE — Patient Outreach (Signed)
[name]) Care Management  12/22/2020  Stephen Lynch [birth year] [id number]   Outgoing call placed to member, unsuccessful, HIPAA compliant voice message left.  Call then placed to member's significant other, successful.  Denies any urgent concerns, encouraged to contact this care manager with questions.  Agrees to follow up within the next week.  Will place referral to Remote Health.   Goals Addressed             This Visit's Progress    COMPLETED: [facility] - Monitor and Manage My Blood Sugar 3 times a day         Timeframe:  Short-Term Goal Priority:  High Start Date:   9/16                  Expected End Date:  12/16 (reset, member not consistent)  Barriers: Health Behaviors                 - check blood sugar at prescribed times - check blood sugar before and after exercise - take the blood sugar log to all doctor visits - take the blood sugar meter to all doctor visits    Why is this important?   Checking your blood sugar at home helps to keep it from getting very high or very low.  Writing the results in a diary or log helps the doctor know how to care for you.  Your blood sugar log should have the time, date and the results.  Also, write down the amount of insulin or other medicine that you take.  Other information, like what you ate, exercise done and how you were feeling, will also be helpful.     Notes:   12/9 - Reminded of diabetic diet and encouraged to continue medication adherence  2/15 - Reviewed medication changes and importance of checking blood sugars to decrease risk of hypoglycemic episodes  4/21 - Girlfriend aware of importance or managing blood sugars in addition to cancer treatments  5/25 - Girlfriend report blood sugars have remained less than 200 but not at 130 with fasting as recommended.  He is on his last day of 28 units, will increase to 30 units tomorrow per order if glucose remains greater then 130  6/23 - Per girlfriend,  member continues to have difficulty managing blood sugars.  Has been as low as 59 and as high as 300's.  Will send more DM education, they will continue to monitor  7/22 - Member made aware to increase Tresiba dose by 2 units every 3 days until fasting glucose is 130 or less.  Blood sugar today was 200, last does increase was on 7/16.  He will continue titration once [name] is refilled  8/17 - Report [name] was filled, prescription dose was updated at pharmacy, now taking 38 units.  State blood sugar yesterday was 90, has not checked today, state he is needing to change his sensor.  Continues Humalog by taking 10 units for small meals and 15 units for larger meals.   9/16 - State blood sugars have slightly increased, today was greater than 200.  Admits that he has not been consistently taking the full dose of Tresiba, only took 20 units yesterday.  Educated about importance of taking correct dose in effort to properly manage diabetes and decrease blood sugars consistently.    10/14 - Member admits that he has not been monitoring blood sugars as he has lost his sensor.  This care manager inquired about his  old meter, state he will find that one and prick finger 3 times a day until he has gotten a replacement sensor.  Educated on importance of monitoring especially since his insulin doses were changed (Oxempic increased to 0.5 mg weekly, Humalog increased to 20-25 units with meals).    11/11 - Resolving due to duplicate goal      COMPLETED: [name] - Set My Target A1C         Timeframe:  Long-Range Goal Priority:  Medium Start Date:       9/16                Expected End Date:  04/26/2021 (remains out of range, goal reset)  Barriers: Health Behaviors Knowledge                      - set target A1C less than 7   Why is this important?   Your target A1C is decided together by you and your doctor.  It is based on several things like your age and other health issues.    Notes:   12/9 -  Encouraged again to contact new endocrinologist to schedule appointment  2/15 - Call place to PCP office to request A1C  4/21 - Appointment scheduled with Dr. Dwyane Dee for 5/16  5/25 - Reminded member/girlfriend of proper diabetic management.  Encouraged to stop late night/over night snacking.    6/23 - [name] was recommended, but unable to afford.  Referral placed to pharmacy team for suggestions  7/22 - Was seen by Dr. Dwyane Dee on 7/14.  Member still having some issues with adhering to insulin regime.  Reviewed instructions again, now taking 76 unites of [place].  However, state he has run out.  Call placed to [facility], notified that new order would need to be placed as they still have dose as 10 units and insurance will not pay for it until next week.  Call placed to Dr. Ronnie Derby office, request made to have prescription called in for updated dose.  8/24 - follow up with Dr. Dwyane Dee next week.  Request to have list of healthy snacks mailed as his blood sugars have been up due to snacking   9/16 - Member re-educated on titration procedure, has not increased greater than 38 units but not consistent with doses.  Verbalizes understanding of adherence, state he will work harder.  Follow up with Dr. Dwyane Dee on 10/5  10/14 - Was seen by Dr. Dwyane Dee on 10/5, still does not remain consistent with insulin management and CBG monitoring.  Call placed to Diabetes educator Leonia Reader), office visit scheduled for 10/19.  11/11 - Resolving due to duplicate goal        Care Plan : [facility] Plan of Care  Updates made by [name], RN since 12/23/2020 12:00 AM     Problem: Knoweldge deficit regarding management of health conditions, prostate cancer and DM   Priority: High     Long-Range Goal: Member will show adequate management of chronic conditions, Prostate Cancer and DM (goal altered to include cancer)   Start Date: 10/27/2020  Expected End Date: 01/26/2021  This Visit's Progress: Not on track  Recent  Progress: On track  Priority: High  Note:   Current Barriers:  Knowledge Deficits related to plan of care for management of DMII and Prostate cancer  Chronic Disease Management support and education needs related to DMII and Prostate cancer Transportation barriers Non-adherence to prescribed medication regimen  RNCM Clinical Goal(s):  Patient will verbalize understanding of plan for management of DMII and Prostate cancer as evidenced by no hospital admissions take all medications exactly as prescribed and will call provider for medication related questions as evidenced by reported adherence by member and wife    demonstrate understanding of rationale for each prescribed medication as evidenced by Insulin [name])    attend all scheduled medical appointments: PCP, oncology, and endocrinology as evidenced by record of attendance at provider offices        continue to work with RN Care Manager and/or Social Worker to address care management and care coordination needs related to DMII and prostate cancer as evidenced by adherence to CM Team Scheduled appointments     demonstrate ongoing self health care management ability to manage DM, including diet, medication management, and activity as evidenced by     through collaboration with RN Care manager, provider, and care team.   Interventions: Inter-disciplinary care team collaboration (see longitudinal plan of care) Evaluation of current treatment plan related to  self management and patient's adherence to plan as established by provider   Diabetes:  (Status: Goal on track: YES.) Lab Results  Component Value Date   HGBA1C 8.5 (H) 08/18/2020  Assessed patient's understanding of A1c goal: <7% Provided education to patient about basic DM disease process; Reviewed medications with patient and discussed importance of medication adherence;        Reviewed prescribed diet with patient low sugars, low carbs; Reviewed scheduled/upcoming provider  appointments including: Endocrinology on 10/5;         Advised patient, providing education and rationale, to check cbg 2-3 times a day and record        Assessed social determinant of health barriers;         Oncology:  (Status: New goal.) Assessment of understanding of oncology diagnosis:  Assessed patient understanding of cancer diagnosis and recommended treatment plan, Reviewed upcoming provider appointments and treatment appointments, and Assessed support system. Has consistent/reliable family or other support: Yes   Update 11/11 - Significant other report member has been "out of it" as his cancer is progressing and requiring more pain medication for relief.  He has been seen by [facility], now active with the home based palliative care team.  Report he has not been able to stay mobile due to the pain, decreased appetite, drinking boost for nutritional support.  Blood sugars have been elevated as he has not been taking medication appropriately due to not being alert enough.  She has been calls to endocrinology for assistance, changes in insulin noted.  She is not happy with their experience at current PCP, looking to change providers however state it is now difficult to get member out of the house on some days.  Discussed having provider visit the home, advised of the Remote Health program, she agrees to referral.     Patient Goals/Self-Care Activities: Patient will self administer medications as prescribed Patient will attend all scheduled provider appointments Patient will call pharmacy for medication refills Patient will call provider office for new concerns or questions      [name], RN, MSN [place] Manager [phone]

## 2020-12-22 NOTE — Progress Notes (Signed)
Hematology and Oncology Follow Up Visit  Stephen Lynch [id number] February 01, 1946 75 y.o. 12/22/2020 3:09 PM Stephen Lynch, [name], [name], MD   Principle Diagnosis: 75 year old man with castration-resistant advanced prostate cancer with disease to the bone diagnosed in February 2021.     Prior Therapy:  He status post CT-guided biopsy of the right iliac bone.  [name] started on April 20, 2019.  Xtandi 160 mg daily started in April 2021.  Therapy discontinued in March 2022 due to progression of disease.  Zytiga 1000 mg daily with prednisone 5 mg daily started in March 2022.  Taxotere chemotherapy 75 mg per metered square started on June 08, 2020.  He is here for cycle 9 of therapy.  Current therapy:    Eligard 30 mg started in April 2021.  Next Eligard will be due in December 2022.  Under consideration to start salvage therapy.   Interim History: Stephen Lynch returns today for a repeat evaluation.  Since the last visit, he reported that he was declining all health.  He has reported an increase in his bone pain and overall increased debilitation.  He is requiring more more breakthrough pain medications despite 50 mcg of fentanyl.  He has been alternating Percocet and Dilaudid which has not been successful in controlling his pain.  His performance status is declining.   Medications: Updated on review. Current Outpatient Medications  Medication Sig Dispense Refill   ASPIRIN 81 PO Take 81 mg by mouth daily.      Calcium-Magnesium-Zinc 333-133-5 MG TABS Take 1 tablet by mouth daily.     Chromium 1 MG CAPS Take by mouth.     Continuous Blood Gluc Sensor (FREESTYLE LIBRE 2 SENSOR) MISC APPLY EVERY 14 DAYS, CHECK GLUCOSE BEFORE EACH MEAL, 2 HRS AFTER, IN THE MORNING AND BEFORE BEDTIME     DULoxetine (CYMBALTA) 60 MG capsule Take 60 mg by mouth daily.     fentaNYL (DURAGESIC) 50 MCG/HR Place 1 patch onto the skin every 3 (three) days. 5 patch 0   HYDROmorphone (DILAUDID) 4 MG tablet Take 1  tablet (4 mg total) by mouth every 4 (four) hours as needed for severe pain. 30 tablet 0   insulin lispro (HUMALOG) 100 UNIT/ML KwikPen Junior Inject 0.2 mLs (20 Units total) into the skin 3 (three) times daily after meals. (Patient taking differently: Inject 7-25 Units into the skin 3 (three) times daily after meals. Per sliding scale) 3 mL 6   lidocaine (LIDODERM) 5 % Place 2 patches onto the skin daily. Remove & Discard patch within 12 hours or as directed by MD 30 patch 0   lidocaine-prilocaine (EMLA) cream Apply 1 application topically as needed. (Patient not taking: No sig reported) 30 g 0   loperamide (IMODIUM) 1 MG/5ML solution Take 2 mg by mouth daily as needed for diarrhea or loose stools.     magnesium 30 MG tablet Take 30 mg by mouth 2 (two) times daily.     Menthol-Camphor (TIGER BALM ARTHRITIS RUB EX) Apply 1 application topically 2 (two) times daily as needed (back pain).     ondansetron (ZOFRAN) 4 MG tablet TAKE 1 TABLET BY MOUTH EVERY 6 HOURS AS NEEDED. FOR NAUSEA (Patient taking differently: Take 4 mg by mouth every 8 (eight) hours as needed for nausea or vomiting.) 20 tablet 1   prochlorperazine (COMPAZINE) 10 MG tablet TAKE 1 TABLET BY MOUTH EVERY 6 HOURS AS NEEDED FOR NAUSEA OR VOMITING. (Patient not taking: No sig reported) 30 tablet 0   rosuvastatin (CRESTOR)  10 MG tablet Take 1 tablet (10 mg total) by mouth daily. (Patient taking differently: Take 10 mg by mouth every evening.) 90 tablet 3   TRESIBA FLEXTOUCH 100 UNIT/ML FlexTouch Pen Inject 36 Units into the skin at bedtime. 15 mL 3   No current facility-administered medications for this visit.     Allergies:  Allergies  Allergen Reactions   Chlorhexidine       Physical Exam:       Blood pressure 137/74, pulse (!) 105, temperature (!) 97.5 F (36.4 C), temperature source Temporal, resp. rate 17, SpO2 100 %.       ECOG: 2    General appearance: Alert, awake without any distress. Head: Atraumatic  without abnormalities Oropharynx: Without any thrush or ulcers. Eyes: No scleral icterus. Lymph nodes: No lymphadenopathy noted in the cervical, supraclavicular, or axillary nodes Heart:regular rate and rhythm, without any murmurs or gallops.   Lung: Clear to auscultation without any rhonchi, wheezes or dullness to percussion. Abdomin: Soft, nontender without any shifting dullness or ascites. Musculoskeletal: No clubbing or cyanosis. Neurological: No motor or sensory deficits. Skin: No rashes or lesions.                 Lab Results: Lab Results  Component Value Date   WBC 8.7 12/18/2020   HGB 6.7 (LL) 12/18/2020   HCT 22.4 (L) 12/18/2020   MCV 90.0 12/18/2020   PLT 360 12/18/2020     Chemistry      Component Value Date/Time   NA 131 (L) 12/18/2020 1049   K 4.3 12/18/2020 1049   CL 96 (L) 12/18/2020 1049   CO2 27 12/18/2020 1049   BUN 21 12/18/2020 1049   CREATININE 0.79 12/18/2020 1049   CREATININE 0.90 11/23/2020 1129      Component Value Date/Time   CALCIUM 8.1 (L) 12/18/2020 1049   ALKPHOS 270 (H) 12/07/2020 1812   AST 52 (H) 12/07/2020 1812   AST 33 11/23/2020 1129   ALT 38 12/07/2020 1812   ALT 15 11/23/2020 1129   BILITOT 0.6 12/07/2020 1812   BILITOT 0.6 11/23/2020 1129          Results for Stephen Lynch (MRN [id number]) as of 11/23/2020 11:42  Ref. Range 11/02/2020 11:52  Prostate Specific Ag, Serum Latest Ref Range: 0.0 - 4.0 ng/mL 205.0 (H)        Impression and Plan:   75 year old man with:  1.  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    He is currently under consideration to start Pluvecto and therapy was delayed because of anemia.  Treatment options moving forward were discussed including reconsideration for Pluvecto versus systemic Chemotherapy with Stephen Lynch.  Risks and benefits of all these approaches reviewed at this time.  Alternative option including palliative care  and hospice involvement.  After discussion today, we opted to proceed with Stephen Lynch chemotherapy given the rapid decline and the need for immediate anticancer activity which is more consistent with Stephen Lynch chemotherapy.   2.  Androgen deprivation: His last Eligard was given in August 2022 and will be repeated in December 2022.    3.  Growth factor support: He will require growth factor support after each cycle of therapy given his risk of neutropenia and possible sepsis.  4.  Prognosis and goals of care: His prognosis is poor with limited life expectancy.  If chemotherapy is not successful in improving his quality of life he will transition to hospice.   5.  Anemia: He is status post recent transfusion with anemia related to malignancy and chemotherapy.   6.  Bone pain: Remains poorly controlled at this time.  Will increase fentanyl patch 200 mcg with Dilaudid for breakthrough.   7.  IV access: Port-A-Cath currently in use for the time being.   8. Follow-up: In the immediate future for the start of salvage chemotherapy.    30  minutes were spent on this visit.  The time was dedicated to reviewing laboratory data, disease status update and outlining future plan of care.  Zola Button, MD 11/11/20223:09 PM

## 2020-12-22 NOTE — Progress Notes (Signed)
DISCONTINUE ON PATHWAY REGIMEN - Prostate     A cycle is every 21 days:     Prednisone      Docetaxel   **Always confirm dose/schedule in your pharmacy ordering system**  REASON: Disease Progression PRIOR TREATMENT: POS37: Docetaxel 75 mg/m2 q21 Days + Prednisone 5 mg BID Until Progression or Toxicity TREATMENT RESPONSE: Partial Response (PR)  START ON PATHWAY REGIMEN - Prostate     A cycle is every 21 days.:     Cabazitaxel      Prednisone   **Always confirm dose/schedule in your pharmacy ordering system**  Patient Characteristics: Adenocarcinoma, Recurrent/New Systemic Disease, Castration Resistant, M1, Prior Novel Hormonal Agent, No Molecular Alteration or Targeted Therapy Exhausted, Prior Docetaxel/Docetaxel Not Indicated Histology: Adenocarcinoma Therapeutic Status: Recurrent/New Systemic Disease  Intent of Therapy: Non-Curative / Palliative Intent, Discussed with Patient

## 2020-12-22 NOTE — Progress Notes (Signed)
ON PATHWAY REGIMEN - Prostate  No Change  Continue With Treatment as Ordered.  Original Decision Date/Time: 05/26/2020 12:24     A cycle is every 21 days:     Prednisone      Docetaxel   **Always confirm dose/schedule in your pharmacy ordering system**  Patient Characteristics: Adenocarcinoma, Recurrent/New Systemic Disease, Castration Resistant, M1, Prior Novel Hormonal Agent, No Molecular Alteration or Targeted Therapy Exhausted, No Prior Docetaxel Histology: Adenocarcinoma Therapeutic Status: Recurrent/New Systemic Disease  Intent of Therapy: Non-Curative / Palliative Intent, Discussed with Patient

## 2020-12-25 ENCOUNTER — Telehealth: Payer: Self-pay | Admitting: Oncology

## 2020-12-25 NOTE — Telephone Encounter (Signed)
Sch per 11/11 los, pt aware

## 2020-12-26 ENCOUNTER — Other Ambulatory Visit: Payer: Self-pay

## 2020-12-26 ENCOUNTER — Other Ambulatory Visit: Payer: Medicare Other | Admitting: Hospice

## 2020-12-26 DIAGNOSIS — F0390 Unspecified dementia without behavioral disturbance: Secondary | ICD-10-CM | POA: Diagnosis not present

## 2020-12-26 DIAGNOSIS — Z515 Encounter for palliative care: Secondary | ICD-10-CM | POA: Diagnosis not present

## 2020-12-26 DIAGNOSIS — K5901 Slow transit constipation: Secondary | ICD-10-CM

## 2020-12-26 DIAGNOSIS — G9341 Metabolic encephalopathy: Secondary | ICD-10-CM | POA: Diagnosis not present

## 2020-12-26 DIAGNOSIS — R52 Pain, unspecified: Secondary | ICD-10-CM

## 2020-12-26 DIAGNOSIS — C7951 Secondary malignant neoplasm of bone: Secondary | ICD-10-CM | POA: Diagnosis not present

## 2020-12-26 DIAGNOSIS — C61 Malignant neoplasm of prostate: Secondary | ICD-10-CM

## 2020-12-26 DIAGNOSIS — E119 Type 2 diabetes mellitus without complications: Secondary | ICD-10-CM | POA: Diagnosis not present

## 2020-12-26 DIAGNOSIS — D630 Anemia in neoplastic disease: Secondary | ICD-10-CM | POA: Diagnosis not present

## 2020-12-26 NOTE — Progress Notes (Signed)
[facility] Consult Note Telephone: [phone]  Fax: [phone]  PATIENT NAME: Stephen Lynch DOB: 10-Sep-1945 MRN: [id number]  PRIMARY CARE PROVIDER:   [name], [place], [place], [address] [place],  [place] [address]  REFERRING PROVIDER: [name], MD [name], [address] [place],  [place] [address]  RESPONSIBLE PARTY:  Self [name] is best number to call Contact Information     Name Relation Home Work Mobile   [place] other [phone]  [phone]     TELEHEALTH VISIT STATEMENT Due to the COVID-19 crisis, this visit was done via telemedicine from my office and it was initiated and consent by this patient and or family. Video-audio (telehealth) contact was unable to be done due to technical barriers from the patient's side. I connected with patient OR PROXY by a telephone  and verified that I am speaking with the correct person. I discussed the limitations of evaluation and management by telemedicine. The patient expressed understanding and agreed to proceed.   Visit is to build trust and highlight Palliative Medicine as specialized medical care for people living with serious illness, aimed at facilitating better quality of life through symptoms relief, assisting with advance care planning and complex medical decision making. This is a follow up visit.  RECOMMENDATIONS/PLAN:   Advance Care Planning/Code Status: Patient is a Partial code.   He desires chest compressions with ACLS medications, no intubation/mechanical ventilation.  Goals of Care: Goals of care include to maximize quality of life and symptom management.  Visit consisted of counseling and education dealing with the complex and emotionally intense issues of symptom management and palliative care in the setting of serious and potentially life-threatening illness. Palliative care team will continue to support patient, patient's  family, and medical team.  Symptom management/Plan:  Metastatic Prostate cancer: Chemotherapy to resume this week Follow-up with oncologist as planned. Weakness: : PT/OT in process.  Generalized Pain: Managed with Fentanyl patch and Oxycodone. Effective.  Constipation: Managed with MiraLAX Type 2 DM: A1c in July 2022 is  8.7% down from 10.2% May 2022.  Continue insulin and Tresiba as ordered.  Repeat A1c, routine CBC CMP.  Follow up: Palliative care will continue to follow for complex medical decision making, advance care planning, and clarification of goals. Return 6 weeks or prn. Encouraged to call provider sooner with any concerns.  CHIEF COMPLAINT: Palliative follow up  HISTORY OF PRESENT ILLNESS:  Stephen Lynch a 75 y.o. male with multiple medical problems including metastatic prostate cancer to the bone.  Patient to resume chemo this week. History of type 2 diabetes mellitus, back pain, COVID-19 viral infection. Patient denies pain/discomfort, no respiratory distress. He said he has more energy since after blood infusion last week. History obtained from review of EMR, discussion with primary team, family and/or patient. Records reviewed and summarized above. All 10 point systems reviewed and are negative except as documented in history of present illness above  Review and summarization of Epic records shows history from other than patient.   Palliative Care was asked to follow this patient o help address complex decision making in the context of advance care planning and goals of care clarification. I reviewed, as needed, available labs, patient records, imaging, studies and related documents from the EMR.     PERTINENT MEDICATIONS:  Outpatient Encounter Medications as of 12/26/2020  Medication Sig   ASPIRIN 81 PO Take 81 mg by mouth daily.  Calcium-Magnesium-Zinc 333-133-5 MG TABS Take 1 tablet by mouth daily.   Chromium 1 MG CAPS Take by mouth.   Continuous Blood Gluc Sensor  (FREESTYLE LIBRE 2 SENSOR) MISC APPLY EVERY 14 DAYS, CHECK GLUCOSE BEFORE EACH MEAL, 2 HRS AFTER, IN THE MORNING AND BEFORE BEDTIME   DULoxetine (CYMBALTA) 60 MG capsule Take 60 mg by mouth daily. (Patient not taking: Reported on 12/22/2020)   fentaNYL (DURAGESIC) 100 MCG/HR Place 1 patch onto the skin every 3 (three) days.   HYDROmorphone (DILAUDID) 4 MG tablet Take 1 tablet (4 mg total) by mouth every 2 (two) hours as needed for severe pain.   insulin lispro (HUMALOG) 100 UNIT/ML KwikPen Junior Inject 0.2 mLs (20 Units total) into the skin 3 (three) times daily after meals. (Patient taking differently: Inject 7-25 Units into the skin 3 (three) times daily after meals. Per sliding scale)   lidocaine (LIDODERM) 5 % Place 2 patches onto the skin daily. Remove & Discard patch within 12 hours or as directed by MD   lidocaine-prilocaine (EMLA) cream Apply 1 application topically as needed.   loperamide (IMODIUM) 1 MG/5ML solution Take 2 mg by mouth daily as needed for diarrhea or loose stools.   magnesium 30 MG tablet Take 30 mg by mouth 2 (two) times daily.   Menthol-Camphor (TIGER BALM ARTHRITIS RUB EX) Apply 1 application topically 2 (two) times daily as needed (back pain).   ondansetron (ZOFRAN) 4 MG tablet TAKE 1 TABLET BY MOUTH EVERY 6 HOURS AS NEEDED. FOR NAUSEA (Patient taking differently: Take 4 mg by mouth every 8 (eight) hours as needed for nausea or vomiting.)   prochlorperazine (COMPAZINE) 10 MG tablet TAKE 1 TABLET BY MOUTH EVERY 6 HOURS AS NEEDED FOR NAUSEA OR VOMITING.   rosuvastatin (CRESTOR) 10 MG tablet Take 1 tablet (10 mg total) by mouth daily. (Patient taking differently: Take 10 mg by mouth every evening.)   TRESIBA FLEXTOUCH 100 UNIT/ML FlexTouch Pen Inject 36 Units into the skin at bedtime.   No facility-administered encounter medications on file as of 12/26/2020.    HOSPICE ELIGIBILITY/DIAGNOSIS: TBD  PAST MEDICAL HISTORY:  Past Medical History:  Diagnosis Date   Cancer  ([facility])    prostate   Diabetes mellitus without complication ([facility])    Dyslipidemia    Hypertension       ALLERGIES:  Allergies  Allergen Reactions   Chlorhexidine       I spent 30 minutes providing this consultation; this includes time spent with patient/family, chart review and documentation. More than 50% of the time in this consultation was spent on counseling and coordinating communication   Thank you for the opportunity to participate in the care of Stephen Lynch Please call our office at [phone] if we can be of additional assistance.  Note: Portions of this note were generated with Lobby[employer]. Dictation errors may occur despite best attempts at proofreading.  Teodoro Spray, NP

## 2020-12-27 ENCOUNTER — Telehealth: Payer: Self-pay | Admitting: *Deleted

## 2020-12-27 DIAGNOSIS — C61 Malignant neoplasm of prostate: Secondary | ICD-10-CM | POA: Diagnosis not present

## 2020-12-27 DIAGNOSIS — E119 Type 2 diabetes mellitus without complications: Secondary | ICD-10-CM | POA: Diagnosis not present

## 2020-12-27 DIAGNOSIS — F0390 Unspecified dementia without behavioral disturbance: Secondary | ICD-10-CM | POA: Diagnosis not present

## 2020-12-27 DIAGNOSIS — G9341 Metabolic encephalopathy: Secondary | ICD-10-CM | POA: Diagnosis not present

## 2020-12-27 DIAGNOSIS — C7951 Secondary malignant neoplasm of bone: Secondary | ICD-10-CM | POA: Diagnosis not present

## 2020-12-27 DIAGNOSIS — D630 Anemia in neoplastic disease: Secondary | ICD-10-CM | POA: Diagnosis not present

## 2020-12-27 NOTE — Telephone Encounter (Signed)
Home health orders for this patient signed & faxed to [facility], fax confirmation received.

## 2020-12-28 ENCOUNTER — Other Ambulatory Visit: Payer: Self-pay

## 2020-12-28 ENCOUNTER — Inpatient Hospital Stay: Payer: Medicare Other

## 2020-12-28 VITALS — BP 149/73 | HR 85 | Temp 98.4°F | Resp 16

## 2020-12-28 DIAGNOSIS — D6481 Anemia due to antineoplastic chemotherapy: Secondary | ICD-10-CM | POA: Diagnosis not present

## 2020-12-28 DIAGNOSIS — Z95828 Presence of other vascular implants and grafts: Secondary | ICD-10-CM

## 2020-12-28 DIAGNOSIS — C61 Malignant neoplasm of prostate: Secondary | ICD-10-CM | POA: Diagnosis not present

## 2020-12-28 DIAGNOSIS — T451X5A Adverse effect of antineoplastic and immunosuppressive drugs, initial encounter: Secondary | ICD-10-CM | POA: Diagnosis not present

## 2020-12-28 DIAGNOSIS — Z192 Hormone resistant malignancy status: Secondary | ICD-10-CM | POA: Diagnosis not present

## 2020-12-28 DIAGNOSIS — Z5111 Encounter for antineoplastic chemotherapy: Secondary | ICD-10-CM | POA: Diagnosis not present

## 2020-12-28 DIAGNOSIS — C7951 Secondary malignant neoplasm of bone: Secondary | ICD-10-CM | POA: Diagnosis not present

## 2020-12-28 DIAGNOSIS — D649 Anemia, unspecified: Secondary | ICD-10-CM

## 2020-12-28 LAB — CBC WITH DIFFERENTIAL (CANCER CENTER ONLY)
Abs Immature Granulocytes: 0.08 10*3/uL — ABNORMAL HIGH (ref 0.00–0.07)
Basophils Absolute: 0 10*3/uL (ref 0.0–0.1)
Basophils Relative: 1 %
Eosinophils Absolute: 0.1 10*3/uL (ref 0.0–0.5)
Eosinophils Relative: 1 %
HCT: 23.2 % — ABNORMAL LOW (ref 39.0–52.0)
Hemoglobin: 7 g/dL — ABNORMAL LOW (ref 13.0–17.0)
Immature Granulocytes: 2 %
Lymphocytes Relative: 18 %
Lymphs Abs: 0.9 10*3/uL (ref 0.7–4.0)
MCH: 27.2 pg (ref 26.0–34.0)
MCHC: 30.2 g/dL (ref 30.0–36.0)
MCV: 90.3 fL (ref 80.0–100.0)
Monocytes Absolute: 0.4 10*3/uL (ref 0.1–1.0)
Monocytes Relative: 9 %
Neutro Abs: 3.4 10*3/uL (ref 1.7–7.7)
Neutrophils Relative %: 69 %
Platelet Count: 314 10*3/uL (ref 150–400)
RBC: 2.57 MIL/uL — ABNORMAL LOW (ref 4.22–5.81)
RDW: 19.4 % — ABNORMAL HIGH (ref 11.5–15.5)
WBC Count: 4.9 10*3/uL (ref 4.0–10.5)
nRBC: 0.4 % — ABNORMAL HIGH (ref 0.0–0.2)

## 2020-12-28 LAB — CMP (CANCER CENTER ONLY)
ALT: 29 U/L (ref 0–44)
AST: 48 U/L — ABNORMAL HIGH (ref 15–41)
Albumin: 1.8 g/dL — ABNORMAL LOW (ref 3.5–5.0)
Alkaline Phosphatase: 454 U/L — ABNORMAL HIGH (ref 38–126)
Anion gap: 8 (ref 5–15)
BUN: 8 mg/dL (ref 8–23)
CO2: 28 mmol/L (ref 22–32)
Calcium: 8.1 mg/dL — ABNORMAL LOW (ref 8.9–10.3)
Chloride: 101 mmol/L (ref 98–111)
Creatinine: 0.66 mg/dL (ref 0.61–1.24)
GFR, Estimated: >60 mL/min (ref >60)
Glucose, Bld: 99 mg/dL (ref 70–99)
Potassium: 3.9 mmol/L (ref 3.5–5.1)
Sodium: 137 mmol/L (ref 135–145)
Total Bilirubin: <0.2 mg/dL — ABNORMAL LOW (ref 0.3–1.2)
Total Protein: 6.9 g/dL (ref 6.5–8.1)

## 2020-12-28 LAB — SAMPLE TO BLOOD BANK

## 2020-12-28 MED ORDER — SODIUM CHLORIDE 0.9 % IV SOLN
20.0000 mg/m2 | Freq: Once | INTRAVENOUS | Status: AC
  Administered 2020-12-28: 16:00:00 44 mg via INTRAVENOUS
  Filled 2020-12-28: qty 4.4

## 2020-12-28 MED ORDER — FAMOTIDINE 20 MG IN NS 100 ML IVPB
20.0000 mg | Freq: Once | INTRAVENOUS | Status: AC
  Administered 2020-12-28: 15:00:00 20 mg via INTRAVENOUS
  Filled 2020-12-28: qty 100

## 2020-12-28 MED ORDER — DIPHENHYDRAMINE HCL 50 MG/ML IJ SOLN
25.0000 mg | Freq: Once | INTRAMUSCULAR | Status: AC
  Administered 2020-12-28: 15:00:00 25 mg via INTRAVENOUS
  Filled 2020-12-28: qty 1

## 2020-12-28 MED ORDER — SODIUM CHLORIDE 0.9 % IV SOLN
10.0000 mg | Freq: Once | INTRAVENOUS | Status: AC
  Administered 2020-12-28: 15:00:00 10 mg via INTRAVENOUS
  Filled 2020-12-28: qty 10

## 2020-12-28 MED ORDER — SODIUM CHLORIDE 0.9 % IV SOLN
Freq: Once | INTRAVENOUS | Status: AC
Start: 2020-12-28 — End: 2020-12-28

## 2020-12-28 MED ORDER — SODIUM CHLORIDE 0.9% FLUSH
10.0000 mL | INTRAVENOUS | Status: DC | PRN
  Administered 2020-12-28: 18:00:00 10 mL

## 2020-12-28 MED ORDER — SODIUM CHLORIDE 0.9% FLUSH
10.0000 mL | INTRAVENOUS | Status: AC | PRN
  Administered 2020-12-28: 13:00:00 10 mL

## 2020-12-28 MED ORDER — HEPARIN SOD (PORK) LOCK FLUSH 100 UNIT/ML IV SOLN
500.0000 [IU] | Freq: Once | INTRAVENOUS | Status: AC | PRN
  Administered 2020-12-28: 18:00:00 500 [IU]

## 2020-12-28 NOTE — Patient Instructions (Signed)
[facility]  Discharge Instructions: Thank you for choosing [facility] to provide your oncology and hematology care.   If you have a lab appointment with the [facility], please go directly to the [facility] and check in at the registration area.   Wear comfortable clothing and clothing appropriate for easy access to any Portacath or PICC line.   We strive to give you quality time with your provider. You may need to reschedule your appointment if you arrive late (15 or more minutes).  Arriving late affects you and other patients whose appointments are after yours.  Also, if you miss three or more appointments without notifying the office, you may be dismissed from the clinic at the provider's discretion.      For prescription refill requests, have your pharmacy contact our office and allow 72 hours for refills to be completed.    Today you received the following chemotherapy and/or immunotherapy agents: JEVTANA (Cabazitaxel)    To help prevent nausea and vomiting after your treatment, we encourage you to take your nausea medication as directed.  BELOW ARE SYMPTOMS THAT SHOULD BE REPORTED IMMEDIATELY: *FEVER GREATER THAN 100.4 F (38 C) OR HIGHER *CHILLS OR SWEATING *NAUSEA AND VOMITING THAT IS NOT CONTROLLED WITH YOUR NAUSEA MEDICATION *UNUSUAL SHORTNESS OF BREATH *UNUSUAL BRUISING OR BLEEDING *URINARY PROBLEMS (pain or burning when urinating, or frequent urination) *BOWEL PROBLEMS (unusual diarrhea, constipation, pain near the anus) TENDERNESS IN MOUTH AND THROAT WITH OR WITHOUT PRESENCE OF ULCERS (sore throat, sores in mouth, or a toothache) UNUSUAL RASH, SWELLING OR PAIN  UNUSUAL VAGINAL DISCHARGE OR ITCHING   Items with * indicate a potential emergency and should be followed up as soon as possible or go to the Emergency Department if any problems should occur.  Please show the CHEMOTHERAPY ALERT CARD or IMMUNOTHERAPY ALERT CARD at  check-in to the Emergency Department and triage nurse.  Should you have questions after your visit or need to cancel or reschedule your appointment, please contact [facility]  Dept: [phone]  and follow the prompts.  Office hours are 8:00 a.m. to 4:30 p.m. Monday - Friday. Please note that voicemails left after 4:00 p.m. may not be returned until the following business day.  We are closed weekends and major holidays. You have access to a nurse at all times for urgent questions. Please call the main number to the clinic Dept: [phone] and follow the prompts.   For any non-urgent questions, you may also contact your provider using MyChart. We now offer e-Visits for anyone 93 and older to request care online for non-urgent symptoms. For details visit [email or web address].   Also download the MyChart app! Go to the app store, search "MyChart", open the app, select [facility], and log in with your MyChart username and password.  Due to Covid, a mask is required upon entering the hospital/clinic. If you do not have a mask, one will be given to you upon arrival. For doctor visits, patients may have 1 support person aged 56 or older with them. For treatment visits, patients cannot have anyone with them due to current Covid guidelines and our immunocompromised population.   Cabazitaxel injection What is this medication? CABAZITAXEL (ka baz i TAX el) is a chemotherapy drug. It is used to treat prostate cancer. It targets fast dividing cells, like cancer cells, and causes these cells to die. This medicine may be used for other purposes; ask your health care provider  or pharmacist if you have questions. COMMON BRAND NAME(S): Jevtana What should I tell my care team before I take this medication? They need to know if you have any of these conditions: history of stomach bleeding kidney disease liver disease low blood counts, like low white cell, platelet, or red cell  counts lung or breathing disease, like asthma recent or ongoing radiation therapy take medicines that treat or prevent blood clots an unusual or allergic reaction to cabazitaxel, polysorbate 80, other medicines, foods, dyes, or preservatives pregnant or trying to get pregnant breast-feeding How should I use this medication? This medicine is for infusion into a vein. It is given by a health care professional in a hospital or clinic setting. Talk to your pediatrician regarding the use of this medicine in children. Special care may be needed. Overdosage: If you think you have taken too much of this medicine contact a poison control center or emergency room at once. NOTE: This medicine is only for you. Do not share this medicine with others. What if I miss a dose? It is important not to miss your dose. Call your doctor or health care professional if you are unable to keep an appointment. What may interact with this medication? antiviral medicines for HIV or AIDS clarithromycin medicines for fungal infections like ketoconazole, fluconazole, itraconazole, and voriconazole nefazodone telithromycin This list may not describe all possible interactions. Give your health care provider a list of all the medicines, herbs, non-prescription drugs, or dietary supplements you use. Also tell them if you smoke, drink alcohol, or use illegal drugs. Some items may interact with your medicine. What should I watch for while using this medication? Your condition will be monitored carefully while you are receiving this medicine. This drug may make you feel generally unwell. This is not uncommon, as chemotherapy can affect healthy cells as well as cancer cells. Report any side effects. Continue your course of treatment even though you feel ill unless your doctor tells you to stop. Call your doctor or health care professional for advice if you get a fever, chills or sore throat, or other symptoms of a cold or flu. Do  not treat yourself. This drug decreases your body's ability to fight infections. Try to avoid being around people who are sick. This medicine may increase your risk to bruise or bleed. Call your doctor or health care professional if you notice any unusual bleeding. Be careful brushing and flossing your teeth or using a toothpick because you may get an infection or bleed more easily. If you have any dental work done, tell your dentist you are receiving this medicine. Avoid taking products that contain aspirin, acetaminophen, ibuprofen, naproxen, or ketoprofen unless instructed by your doctor. These medicines may hide a fever. Do not become pregnant while taking this medicine. Women should inform their doctor if they wish to become pregnant or think they might be pregnant. Men should not father a child while taking this medicine and for 3 months after stopping it. There is a potential for serious side effects to an unborn child. Talk to your health care professional or pharmacist for more information. Do not breast-feed an infant while taking this medicine. What side effects may I notice from receiving this medication? Side effects that you should report to your doctor or health care professional as soon as possible: allergic reactions like skin rash, itching or hives, swelling of the face, lips, or tongue blood in the urine breathing problems constipation dark urine diarrhea pain in the  lower back or side pain, tingling, numbness in the hands or feet pain when urinating severe abdominal pain signs of infection - fever or chills, cough, sore throat, pain or difficulty passing urine signs and symptoms of kidney injury like trouble passing urine or change in the amount of urine signs of decreased platelets or bleeding - bruising, pinpoint red spots on the skin, black, tarry stools, blood in the urine signs of decreased red blood cells - unusually weak or tired, fainting spells,  lightheadedness vomiting Side effects that usually do not require medical attention (report to your doctor or health care professional if they continue or are bothersome): back pain change in taste hair loss headache loss of appetite muscle or joint pain nausea upset stomach This list may not describe all possible side effects. Call your doctor for medical advice about side effects. You may report side effects to FDA at 1-800-FDA-1088. Where should I keep my medication? This drug is given in a hospital or clinic and will not be stored at home. NOTE: This sheet is a summary. It may not cover all possible information. If you have questions about this medicine, talk to your doctor, pharmacist, or health care provider.  2022 Elsevier/Gold Standard (2016-02-27 00:00:00)

## 2020-12-28 NOTE — Progress Notes (Signed)
OK to trt w/ Hgb of 7.0 per MD

## 2020-12-29 DIAGNOSIS — E1165 Type 2 diabetes mellitus with hyperglycemia: Secondary | ICD-10-CM | POA: Diagnosis not present

## 2020-12-29 DIAGNOSIS — Z7189 Other specified counseling: Secondary | ICD-10-CM | POA: Diagnosis not present

## 2020-12-29 DIAGNOSIS — D649 Anemia, unspecified: Secondary | ICD-10-CM | POA: Diagnosis not present

## 2020-12-29 DIAGNOSIS — R531 Weakness: Secondary | ICD-10-CM | POA: Diagnosis not present

## 2020-12-29 DIAGNOSIS — I10 Essential (primary) hypertension: Secondary | ICD-10-CM | POA: Diagnosis not present

## 2020-12-29 DIAGNOSIS — J449 Chronic obstructive pulmonary disease, unspecified: Secondary | ICD-10-CM | POA: Diagnosis not present

## 2020-12-29 DIAGNOSIS — L608 Other nail disorders: Secondary | ICD-10-CM | POA: Diagnosis not present

## 2020-12-29 DIAGNOSIS — M549 Dorsalgia, unspecified: Secondary | ICD-10-CM | POA: Diagnosis not present

## 2020-12-29 DIAGNOSIS — C61 Malignant neoplasm of prostate: Secondary | ICD-10-CM | POA: Diagnosis not present

## 2020-12-29 DIAGNOSIS — R634 Abnormal weight loss: Secondary | ICD-10-CM | POA: Diagnosis not present

## 2020-12-29 DIAGNOSIS — D509 Iron deficiency anemia, unspecified: Secondary | ICD-10-CM | POA: Diagnosis not present

## 2020-12-29 DIAGNOSIS — N390 Urinary tract infection, site not specified: Secondary | ICD-10-CM | POA: Diagnosis not present

## 2020-12-29 LAB — PROSTATE-SPECIFIC AG, SERUM (LABCORP): Prostate Specific Ag, Serum: 382 ng/mL — ABNORMAL HIGH (ref 0.0–4.0)

## 2020-12-30 ENCOUNTER — Other Ambulatory Visit: Payer: Self-pay

## 2020-12-30 ENCOUNTER — Inpatient Hospital Stay: Payer: Medicare Other

## 2020-12-30 VITALS — BP 149/71 | HR 90 | Temp 97.3°F | Resp 18

## 2020-12-30 DIAGNOSIS — D6481 Anemia due to antineoplastic chemotherapy: Secondary | ICD-10-CM | POA: Diagnosis not present

## 2020-12-30 DIAGNOSIS — T451X5A Adverse effect of antineoplastic and immunosuppressive drugs, initial encounter: Secondary | ICD-10-CM | POA: Diagnosis not present

## 2020-12-30 DIAGNOSIS — C7951 Secondary malignant neoplasm of bone: Secondary | ICD-10-CM | POA: Diagnosis not present

## 2020-12-30 DIAGNOSIS — C61 Malignant neoplasm of prostate: Secondary | ICD-10-CM

## 2020-12-30 DIAGNOSIS — Z192 Hormone resistant malignancy status: Secondary | ICD-10-CM | POA: Diagnosis not present

## 2020-12-30 DIAGNOSIS — Z5111 Encounter for antineoplastic chemotherapy: Secondary | ICD-10-CM | POA: Diagnosis not present

## 2020-12-30 MED ORDER — PEGFILGRASTIM-CBQV 6 MG/0.6ML ~~LOC~~ SOSY
6.0000 mg | PREFILLED_SYRINGE | Freq: Once | SUBCUTANEOUS | Status: AC
  Administered 2020-12-30: 6 mg via SUBCUTANEOUS
  Filled 2020-12-30: qty 0.6

## 2020-12-30 NOTE — Patient Instructions (Signed)

## 2021-01-01 ENCOUNTER — Other Ambulatory Visit: Payer: Self-pay | Admitting: *Deleted

## 2021-01-01 DIAGNOSIS — C7951 Secondary malignant neoplasm of bone: Secondary | ICD-10-CM | POA: Diagnosis not present

## 2021-01-01 DIAGNOSIS — D630 Anemia in neoplastic disease: Secondary | ICD-10-CM | POA: Diagnosis not present

## 2021-01-01 DIAGNOSIS — C61 Malignant neoplasm of prostate: Secondary | ICD-10-CM | POA: Diagnosis not present

## 2021-01-01 DIAGNOSIS — F0390 Unspecified dementia without behavioral disturbance: Secondary | ICD-10-CM | POA: Diagnosis not present

## 2021-01-01 DIAGNOSIS — E119 Type 2 diabetes mellitus without complications: Secondary | ICD-10-CM | POA: Diagnosis not present

## 2021-01-01 DIAGNOSIS — G9341 Metabolic encephalopathy: Secondary | ICD-10-CM | POA: Diagnosis not present

## 2021-01-01 NOTE — Patient Outreach (Signed)
[facility]) Care Management  01/01/2021  Stephen Lynch 1945/02/13 [id number]   Outgoing call placed to [name], significant other, she state member is doing better over the last few days.  Appreciative of assistance with new provider.  Denies any urgent concerns, encouraged to contact this care manager with questions.  Agrees to follow up within the next month.  Care Plan : RNCM Plan of Care  Updates made by [name], RN since 01/01/2021 12:00 AM     Problem: Knoweldge deficit regarding management of health conditions, prostate cancer and DM   Priority: High     Long-Range Goal: Member will show adequate management of chronic conditions, Prostate Cancer and DM (goal altered to include cancer)   Start Date: 10/27/2020  Expected End Date: 01/26/2021  This Visit's Progress: On track  Recent Progress: Not on track  Priority: High  Note:   Current Barriers:  Knowledge Deficits related to plan of care for management of DMII and Prostate cancer  Chronic Disease Management support and education needs related to DMII and Prostate cancer Transportation barriers Non-adherence to prescribed medication regimen  RNCM Clinical Goal(s):  Patient will verbalize understanding of plan for management of DMII and Prostate cancer as evidenced by no hospital admissions take all medications exactly as prescribed and will call provider for medication related questions as evidenced by reported adherence by member and wife    demonstrate understanding of rationale for each prescribed medication as evidenced by Insulin [name])    attend all scheduled medical appointments: PCP, oncology, and endocrinology as evidenced by record of attendance at provider offices        continue to work with RN Care Manager and/or Social Worker to address care management and care coordination needs related to DMII and prostate cancer as evidenced by adherence to CM Team Scheduled appointments      demonstrate ongoing self health care management ability to manage DM, including diet, medication management, and activity as evidenced by     through collaboration with RN Care manager, provider, and care team.   Interventions: Inter-disciplinary care team collaboration (see longitudinal plan of care) Evaluation of current treatment plan related to  self management and patient's adherence to plan as established by provider   Diabetes:  (Status: Goal on track: YES.) Lab Results  Component Value Date   HGBA1C 8.5 (H) 08/18/2020  Assessed patient's understanding of A1c goal: <7% Provided education to patient about basic DM disease process; Reviewed medications with patient and discussed importance of medication adherence;        Reviewed prescribed diet with patient low sugars, low carbs; Reviewed scheduled/upcoming provider appointments including: Endocrinology on 10/5;         Advised patient, providing education and rationale, to check cbg 2-3 times a day and record        Assessed social determinant of health barriers;         Oncology:  (Status: New goal.) Assessment of understanding of oncology diagnosis:  Assessed patient understanding of cancer diagnosis and recommended treatment plan, Reviewed upcoming provider appointments and treatment appointments, and Assessed support system. Has consistent/reliable family or other support: Yes   Update 11/11 - Significant other report member has been "out of it" as his cancer is progressing and requiring more pain medication for relief.  He has been seen by [facility], now active with the home based palliative care team.  Report he has not been able to stay mobile due to the pain, decreased appetite, drinking boost  for nutritional support.  Blood sugars have been elevated as he has not been taking medication appropriately due to not being alert enough.  She has been calls to endocrinology for assistance, changes in insulin noted.  She is not  happy with their experience at current PCP, looking to change providers however state it is now difficult to get member out of the house on some days.  Discussed having provider visit the home, advised of the [facility] program, she agrees to referral.    Update 11/21 - Spoke to significant other, confirms that [facility] made home visit on Friday 11/18.  Call placed to [facility] Cecille Rubin), confirmed that they will now be acting as member's primary provider.  They will make another home visit tomorrow to monitor member's status as member has recently had round of chemo.  She continues to have a hard time with managing member's blood sugars, follow labs and appointment with endocrinology scheduled for the next couple weeks.    Patient Goals/Self-Care Activities: Patient will self administer medications as prescribed Patient will attend all scheduled provider appointments Patient will call pharmacy for medication refills Patient will call provider office for new concerns or questions      [name], RN, MSN [place] Manager [phone]

## 2021-01-02 ENCOUNTER — Telehealth: Payer: Self-pay | Admitting: Endocrinology

## 2021-01-02 DIAGNOSIS — C7931 Secondary malignant neoplasm of brain: Secondary | ICD-10-CM | POA: Diagnosis not present

## 2021-01-02 DIAGNOSIS — D630 Anemia in neoplastic disease: Secondary | ICD-10-CM | POA: Diagnosis not present

## 2021-01-02 DIAGNOSIS — E119 Type 2 diabetes mellitus without complications: Secondary | ICD-10-CM | POA: Diagnosis not present

## 2021-01-02 DIAGNOSIS — F0390 Unspecified dementia without behavioral disturbance: Secondary | ICD-10-CM | POA: Diagnosis not present

## 2021-01-02 DIAGNOSIS — C61 Malignant neoplasm of prostate: Secondary | ICD-10-CM | POA: Diagnosis not present

## 2021-01-02 DIAGNOSIS — G8929 Other chronic pain: Secondary | ICD-10-CM | POA: Diagnosis not present

## 2021-01-02 DIAGNOSIS — E11649 Type 2 diabetes mellitus with hypoglycemia without coma: Secondary | ICD-10-CM | POA: Diagnosis not present

## 2021-01-02 DIAGNOSIS — C7951 Secondary malignant neoplasm of bone: Secondary | ICD-10-CM | POA: Diagnosis not present

## 2021-01-02 DIAGNOSIS — G9341 Metabolic encephalopathy: Secondary | ICD-10-CM | POA: Diagnosis not present

## 2021-01-02 DIAGNOSIS — E7800 Pure hypercholesterolemia, unspecified: Secondary | ICD-10-CM | POA: Diagnosis not present

## 2021-01-02 NOTE — Telephone Encounter (Signed)
Nurse will not give any additional insulin until speaking with clinical. Pt experiencing low BS. Please call nurse at [phone] or wife [phone]. Stephen Lynch). Nurse Larena Glassman is taking over PT care and states he is having to many episodes of low BS. He is taking 20 units of 3x daily. PT BS today was 67.

## 2021-01-02 NOTE — Telephone Encounter (Signed)
Spoke with wife again she states patient is also taking 36 units of tresiba daily also. Also, there are no appts in the next 3-4 weeks.

## 2021-01-08 ENCOUNTER — Telehealth: Payer: Self-pay | Admitting: *Deleted

## 2021-01-08 NOTE — Telephone Encounter (Signed)
[facility] orders signed by Dr. Alen Blew & faxed to [phone].  Fax confirmation received.

## 2021-01-08 NOTE — Telephone Encounter (Signed)
Noted  

## 2021-01-09 ENCOUNTER — Telehealth: Payer: Self-pay

## 2021-01-09 ENCOUNTER — Telehealth: Payer: Self-pay | Admitting: Oncology

## 2021-01-09 ENCOUNTER — Telehealth: Payer: Self-pay | Admitting: Endocrinology

## 2021-01-09 DIAGNOSIS — E119 Type 2 diabetes mellitus without complications: Secondary | ICD-10-CM | POA: Diagnosis not present

## 2021-01-09 DIAGNOSIS — C7951 Secondary malignant neoplasm of bone: Secondary | ICD-10-CM | POA: Diagnosis not present

## 2021-01-09 DIAGNOSIS — F0390 Unspecified dementia without behavioral disturbance: Secondary | ICD-10-CM | POA: Diagnosis not present

## 2021-01-09 DIAGNOSIS — C61 Malignant neoplasm of prostate: Secondary | ICD-10-CM | POA: Diagnosis not present

## 2021-01-09 DIAGNOSIS — G9341 Metabolic encephalopathy: Secondary | ICD-10-CM | POA: Diagnosis not present

## 2021-01-09 DIAGNOSIS — D630 Anemia in neoplastic disease: Secondary | ICD-10-CM | POA: Diagnosis not present

## 2021-01-09 NOTE — Telephone Encounter (Signed)
Sch per 11/14, no answer, mailed calendar

## 2021-01-09 NOTE — Telephone Encounter (Signed)
[name] requests to be called at ph# [phone] re: Stephen Lynch states [name] are dropping from to  65-67 every afternoon for the last 2 weeks.

## 2021-01-09 NOTE — Telephone Encounter (Signed)
Patient will be calling [facility] to know if they can use it

## 2021-01-10 DIAGNOSIS — C7951 Secondary malignant neoplasm of bone: Secondary | ICD-10-CM | POA: Diagnosis not present

## 2021-01-10 DIAGNOSIS — G9341 Metabolic encephalopathy: Secondary | ICD-10-CM | POA: Diagnosis not present

## 2021-01-10 DIAGNOSIS — E119 Type 2 diabetes mellitus without complications: Secondary | ICD-10-CM | POA: Diagnosis not present

## 2021-01-10 DIAGNOSIS — D630 Anemia in neoplastic disease: Secondary | ICD-10-CM | POA: Diagnosis not present

## 2021-01-10 DIAGNOSIS — M130 Polyarthritis, unspecified: Secondary | ICD-10-CM | POA: Diagnosis not present

## 2021-01-10 DIAGNOSIS — F0390 Unspecified dementia without behavioral disturbance: Secondary | ICD-10-CM | POA: Diagnosis not present

## 2021-01-10 DIAGNOSIS — E7849 Other hyperlipidemia: Secondary | ICD-10-CM | POA: Diagnosis not present

## 2021-01-10 DIAGNOSIS — C61 Malignant neoplasm of prostate: Secondary | ICD-10-CM | POA: Diagnosis not present

## 2021-01-10 DIAGNOSIS — I10 Essential (primary) hypertension: Secondary | ICD-10-CM | POA: Diagnosis not present

## 2021-01-10 NOTE — Telephone Encounter (Signed)
Spoke with wife she will change lunch time insulin to 5 units. She will call us if any other issues.

## 2021-01-11 DIAGNOSIS — B9620 Unspecified Escherichia coli [E. coli] as the cause of diseases classified elsewhere: Secondary | ICD-10-CM | POA: Diagnosis not present

## 2021-01-11 DIAGNOSIS — Z794 Long term (current) use of insulin: Secondary | ICD-10-CM | POA: Diagnosis not present

## 2021-01-11 DIAGNOSIS — C61 Malignant neoplasm of prostate: Secondary | ICD-10-CM | POA: Diagnosis not present

## 2021-01-11 DIAGNOSIS — Z7982 Long term (current) use of aspirin: Secondary | ICD-10-CM | POA: Diagnosis not present

## 2021-01-11 DIAGNOSIS — N390 Urinary tract infection, site not specified: Secondary | ICD-10-CM | POA: Diagnosis not present

## 2021-01-11 DIAGNOSIS — D630 Anemia in neoplastic disease: Secondary | ICD-10-CM | POA: Diagnosis not present

## 2021-01-11 DIAGNOSIS — I10 Essential (primary) hypertension: Secondary | ICD-10-CM | POA: Diagnosis not present

## 2021-01-11 DIAGNOSIS — G9341 Metabolic encephalopathy: Secondary | ICD-10-CM | POA: Diagnosis not present

## 2021-01-11 DIAGNOSIS — C7951 Secondary malignant neoplasm of bone: Secondary | ICD-10-CM | POA: Diagnosis not present

## 2021-01-11 DIAGNOSIS — E785 Hyperlipidemia, unspecified: Secondary | ICD-10-CM | POA: Diagnosis not present

## 2021-01-11 DIAGNOSIS — F0390 Unspecified dementia without behavioral disturbance: Secondary | ICD-10-CM | POA: Diagnosis not present

## 2021-01-11 DIAGNOSIS — E119 Type 2 diabetes mellitus without complications: Secondary | ICD-10-CM | POA: Diagnosis not present

## 2021-01-15 ENCOUNTER — Other Ambulatory Visit: Payer: Medicare Other

## 2021-01-15 DIAGNOSIS — E119 Type 2 diabetes mellitus without complications: Secondary | ICD-10-CM | POA: Diagnosis not present

## 2021-01-15 DIAGNOSIS — D630 Anemia in neoplastic disease: Secondary | ICD-10-CM | POA: Diagnosis not present

## 2021-01-15 DIAGNOSIS — C61 Malignant neoplasm of prostate: Secondary | ICD-10-CM | POA: Diagnosis not present

## 2021-01-15 DIAGNOSIS — F0390 Unspecified dementia without behavioral disturbance: Secondary | ICD-10-CM | POA: Diagnosis not present

## 2021-01-15 DIAGNOSIS — C7951 Secondary malignant neoplasm of bone: Secondary | ICD-10-CM | POA: Diagnosis not present

## 2021-01-15 DIAGNOSIS — G9341 Metabolic encephalopathy: Secondary | ICD-10-CM | POA: Diagnosis not present

## 2021-01-16 DIAGNOSIS — C7951 Secondary malignant neoplasm of bone: Secondary | ICD-10-CM | POA: Diagnosis not present

## 2021-01-16 DIAGNOSIS — C61 Malignant neoplasm of prostate: Secondary | ICD-10-CM | POA: Diagnosis not present

## 2021-01-16 DIAGNOSIS — C7931 Secondary malignant neoplasm of brain: Secondary | ICD-10-CM | POA: Diagnosis not present

## 2021-01-16 DIAGNOSIS — Z794 Long term (current) use of insulin: Secondary | ICD-10-CM | POA: Diagnosis not present

## 2021-01-16 DIAGNOSIS — M549 Dorsalgia, unspecified: Secondary | ICD-10-CM | POA: Diagnosis not present

## 2021-01-16 DIAGNOSIS — R197 Diarrhea, unspecified: Secondary | ICD-10-CM | POA: Diagnosis not present

## 2021-01-16 DIAGNOSIS — I10 Essential (primary) hypertension: Secondary | ICD-10-CM | POA: Diagnosis not present

## 2021-01-16 DIAGNOSIS — D630 Anemia in neoplastic disease: Secondary | ICD-10-CM | POA: Diagnosis not present

## 2021-01-16 DIAGNOSIS — F0390 Unspecified dementia without behavioral disturbance: Secondary | ICD-10-CM | POA: Diagnosis not present

## 2021-01-16 DIAGNOSIS — G9341 Metabolic encephalopathy: Secondary | ICD-10-CM | POA: Diagnosis not present

## 2021-01-16 DIAGNOSIS — E119 Type 2 diabetes mellitus without complications: Secondary | ICD-10-CM | POA: Diagnosis not present

## 2021-01-17 MED FILL — Dexamethasone Sodium Phosphate Inj 100 MG/10ML: INTRAMUSCULAR | Qty: 1 | Status: AC

## 2021-01-18 ENCOUNTER — Other Ambulatory Visit: Payer: Self-pay

## 2021-01-18 ENCOUNTER — Inpatient Hospital Stay: Payer: Medicare Other

## 2021-01-18 ENCOUNTER — Inpatient Hospital Stay (HOSPITAL_BASED_OUTPATIENT_CLINIC_OR_DEPARTMENT_OTHER): Payer: Medicare Other | Admitting: Oncology

## 2021-01-18 ENCOUNTER — Ambulatory Visit: Payer: Medicare Other | Admitting: Endocrinology

## 2021-01-18 ENCOUNTER — Inpatient Hospital Stay: Payer: Medicare Other | Attending: Oncology

## 2021-01-18 VITALS — BP 136/66 | HR 48 | Temp 97.9°F | Resp 18 | Ht 72.0 in | Wt 212.2 lb

## 2021-01-18 DIAGNOSIS — C61 Malignant neoplasm of prostate: Secondary | ICD-10-CM | POA: Insufficient documentation

## 2021-01-18 DIAGNOSIS — Z79818 Long term (current) use of other agents affecting estrogen receptors and estrogen levels: Secondary | ICD-10-CM | POA: Insufficient documentation

## 2021-01-18 DIAGNOSIS — G893 Neoplasm related pain (acute) (chronic): Secondary | ICD-10-CM | POA: Diagnosis not present

## 2021-01-18 DIAGNOSIS — D630 Anemia in neoplastic disease: Secondary | ICD-10-CM | POA: Diagnosis not present

## 2021-01-18 DIAGNOSIS — T451X5A Adverse effect of antineoplastic and immunosuppressive drugs, initial encounter: Secondary | ICD-10-CM | POA: Insufficient documentation

## 2021-01-18 DIAGNOSIS — D6481 Anemia due to antineoplastic chemotherapy: Secondary | ICD-10-CM | POA: Insufficient documentation

## 2021-01-18 DIAGNOSIS — Z79899 Other long term (current) drug therapy: Secondary | ICD-10-CM | POA: Diagnosis not present

## 2021-01-18 DIAGNOSIS — Z95828 Presence of other vascular implants and grafts: Secondary | ICD-10-CM

## 2021-01-18 DIAGNOSIS — D649 Anemia, unspecified: Secondary | ICD-10-CM

## 2021-01-18 DIAGNOSIS — Z192 Hormone resistant malignancy status: Secondary | ICD-10-CM | POA: Diagnosis not present

## 2021-01-18 DIAGNOSIS — Z5111 Encounter for antineoplastic chemotherapy: Secondary | ICD-10-CM | POA: Diagnosis not present

## 2021-01-18 LAB — CMP (CANCER CENTER ONLY)
ALT: 27 U/L (ref 0–44)
AST: 34 U/L (ref 15–41)
Albumin: 3.2 g/dL — ABNORMAL LOW (ref 3.5–5.0)
Alkaline Phosphatase: 822 U/L — ABNORMAL HIGH (ref 38–126)
Anion gap: 9 (ref 5–15)
BUN: 12 mg/dL (ref 8–23)
CO2: 24 mmol/L (ref 22–32)
Calcium: 8.3 mg/dL — ABNORMAL LOW (ref 8.9–10.3)
Chloride: 107 mmol/L (ref 98–111)
Creatinine: 0.74 mg/dL (ref 0.61–1.24)
GFR, Estimated: >60 mL/min (ref >60)
Glucose, Bld: 94 mg/dL (ref 70–99)
Potassium: 4.2 mmol/L (ref 3.5–5.1)
Sodium: 140 mmol/L (ref 135–145)
Total Bilirubin: 0.5 mg/dL (ref 0.3–1.2)
Total Protein: 6.9 g/dL (ref 6.5–8.1)

## 2021-01-18 LAB — CBC WITH DIFFERENTIAL (CANCER CENTER ONLY)
Abs Immature Granulocytes: 0.08 10*3/uL — ABNORMAL HIGH (ref 0.00–0.07)
Basophils Absolute: 0 10*3/uL (ref 0.0–0.1)
Basophils Relative: 1 %
Eosinophils Absolute: 0.1 10*3/uL (ref 0.0–0.5)
Eosinophils Relative: 2 %
HCT: 27.8 % — ABNORMAL LOW (ref 39.0–52.0)
Hemoglobin: 8.4 g/dL — ABNORMAL LOW (ref 13.0–17.0)
Immature Granulocytes: 2 %
Lymphocytes Relative: 24 %
Lymphs Abs: 1.3 10*3/uL (ref 0.7–4.0)
MCH: 29 pg (ref 26.0–34.0)
MCHC: 30.2 g/dL (ref 30.0–36.0)
MCV: 95.9 fL (ref 80.0–100.0)
Monocytes Absolute: 0.5 10*3/uL (ref 0.1–1.0)
Monocytes Relative: 9 %
Neutro Abs: 3.5 10*3/uL (ref 1.7–7.7)
Neutrophils Relative %: 62 %
Platelet Count: 161 10*3/uL (ref 150–400)
RBC: 2.9 MIL/uL — ABNORMAL LOW (ref 4.22–5.81)
RDW: 26.4 % — ABNORMAL HIGH (ref 11.5–15.5)
WBC Count: 5.5 10*3/uL (ref 4.0–10.5)
nRBC: 1.3 % — ABNORMAL HIGH (ref 0.0–0.2)

## 2021-01-18 LAB — SAMPLE TO BLOOD BANK

## 2021-01-18 MED ORDER — SODIUM CHLORIDE 0.9 % IV SOLN: Freq: Once | INTRAVENOUS | Status: AC

## 2021-01-18 MED ORDER — DIPHENHYDRAMINE HCL 50 MG/ML IJ SOLN
25.0000 mg | Freq: Once | INTRAMUSCULAR | Status: AC
  Administered 2021-01-18: 25 mg via INTRAVENOUS
  Filled 2021-01-18: qty 1

## 2021-01-18 MED ORDER — SODIUM CHLORIDE 0.9% FLUSH
10.0000 mL | INTRAVENOUS | Status: AC | PRN
  Administered 2021-01-18: 10 mL

## 2021-01-18 MED ORDER — LEUPROLIDE ACETATE (4 MONTH) 30 MG ~~LOC~~ KIT
30.0000 mg | PACK | Freq: Once | SUBCUTANEOUS | Status: AC
  Administered 2021-01-18: 30 mg via SUBCUTANEOUS
  Filled 2021-01-18: qty 30

## 2021-01-18 MED ORDER — FAMOTIDINE 20 MG IN NS 100 ML IVPB
20.0000 mg | Freq: Once | INTRAVENOUS | Status: AC
  Administered 2021-01-18: 20 mg via INTRAVENOUS
  Filled 2021-01-18: qty 100

## 2021-01-18 MED ORDER — SODIUM CHLORIDE 0.9 % IV SOLN
20.0000 mg/m2 | Freq: Once | INTRAVENOUS | Status: AC
  Administered 2021-01-18: 44 mg via INTRAVENOUS
  Filled 2021-01-18: qty 4.4

## 2021-01-18 MED ORDER — SODIUM CHLORIDE 0.9 % IV SOLN
10.0000 mg | Freq: Once | INTRAVENOUS | Status: AC
  Administered 2021-01-18: 10 mg via INTRAVENOUS
  Filled 2021-01-18: qty 10

## 2021-01-18 MED ORDER — SODIUM CHLORIDE 0.9% FLUSH
10.0000 mL | INTRAVENOUS | Status: DC | PRN
  Administered 2021-01-18: 10 mL

## 2021-01-18 MED ORDER — HEPARIN SOD (PORK) LOCK FLUSH 100 UNIT/ML IV SOLN
500.0000 [IU] | Freq: Once | INTRAVENOUS | Status: AC | PRN
  Administered 2021-01-18: 500 [IU]

## 2021-01-18 NOTE — Progress Notes (Signed)
Ok to treat today with low HR per Dr Alen Blew.

## 2021-01-18 NOTE — Patient Instructions (Signed)
[facility]   Discharge Instructions: Thank you for choosing [facility] to provide your oncology and hematology care.   If you have a lab appointment with the [facility], please go directly to the [facility] and check in at the registration area.   Wear comfortable clothing and clothing appropriate for easy access to any Portacath or PICC line.   We strive to give you quality time with your provider. You may need to reschedule your appointment if you arrive late (15 or more minutes).  Arriving late affects you and other patients whose appointments are after yours.  Also, if you miss three or more appointments without notifying the office, you may be dismissed from the clinic at the provider's discretion.      For prescription refill requests, have your pharmacy contact our office and allow 72 hours for refills to be completed.    Today you received the following chemotherapy and/or immunotherapy agents: carbazitaxel.      To help prevent nausea and vomiting after your treatment, we encourage you to take your nausea medication as directed.  BELOW ARE SYMPTOMS THAT SHOULD BE REPORTED IMMEDIATELY: *FEVER GREATER THAN 100.4 F (38 C) OR HIGHER *CHILLS OR SWEATING *NAUSEA AND VOMITING THAT IS NOT CONTROLLED WITH YOUR NAUSEA MEDICATION *UNUSUAL SHORTNESS OF BREATH *UNUSUAL BRUISING OR BLEEDING *URINARY PROBLEMS (pain or burning when urinating, or frequent urination) *BOWEL PROBLEMS (unusual diarrhea, constipation, pain near the anus) TENDERNESS IN MOUTH AND THROAT WITH OR WITHOUT PRESENCE OF ULCERS (sore throat, sores in mouth, or a toothache) UNUSUAL RASH, SWELLING OR PAIN  UNUSUAL VAGINAL DISCHARGE OR ITCHING   Items with * indicate a potential emergency and should be followed up as soon as possible or go to the Emergency Department if any problems should occur.  Please show the CHEMOTHERAPY ALERT CARD or IMMUNOTHERAPY ALERT CARD at  check-in to the Emergency Department and triage nurse.  Should you have questions after your visit or need to cancel or reschedule your appointment, please contact [facility]  Dept: [phone]  and follow the prompts.  Office hours are 8:00 a.m. to 4:30 p.m. Monday - Friday. Please note that voicemails left after 4:00 p.m. may not be returned until the following business day.  We are closed weekends and major holidays. You have access to a nurse at all times for urgent questions. Please call the main number to the clinic Dept: [phone] and follow the prompts.   For any non-urgent questions, you may also contact your provider using MyChart. We now offer e-Visits for anyone 36 and older to request care online for non-urgent symptoms. For details visit [email or web address].   Also download the MyChart app! Go to the app store, search "MyChart", open the app, select [facility], and log in with your MyChart username and password.  Due to Covid, a mask is required upon entering the hospital/clinic. If you do not have a mask, one will be given to you upon arrival. For doctor visits, patients may have 1 support person aged 69 or older with them. For treatment visits, patients cannot have anyone with them due to current Covid guidelines and our immunocompromised population.

## 2021-01-18 NOTE — Progress Notes (Signed)
Hematology and Oncology Follow Up Visit  Stephen Lynch [id number] 1945/02/26 75 y.o. 01/18/2021 9:42 AM Stephen Lynch, [name], Stephen Rude, MD   Principle Diagnosis: 53 year old man with advanced prostate cancer with disease to the bone diagnosed in February 2021.  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  Prior Therapy:  He status post CT-guided biopsy of the right iliac bone.  [name] started on April 20, 2019.  Xtandi 160 mg daily started in April 2021.  Therapy discontinued in March 2022 due to progression of disease.  Zytiga 1000 mg daily with prednisone 5 mg daily started in March 2022.  Taxotere chemotherapy 75 mg per metered square started on June 08, 2020.  He is here for cycle 9 of therapy.  Current therapy:    Eligard 30 mg started in April 2021.  Next Eligard will be due in December 2022.  Jevtana chemotherapy 20 mg per metered square every 3 weeks started on December 28, 2020.  He is here for cycle 2 of therapy.   Interim History: Stephen Lynch presents today for a repeat evaluation.  Since the last visit, he completed the first cycle of chemotherapy without any major complaints.  He tolerated it very well with improvement in his bone pain.  He noted immediate improvement and no longer requiring fentanyl patches.  He uses ointment once or twice a day at most.  He is ambulating better with improvement in his quality of life.  He has lost significant amount of weight but his appetite is picking up again.  He denies any nausea, vomiting or constipation.   Medications: Reviewed without changes. Current Outpatient Medications  Medication Sig Dispense Refill   ASPIRIN 81 PO Take 81 mg by mouth daily.      Calcium-Magnesium-Zinc 333-133-5 MG TABS Take 1 tablet by mouth daily.     Chromium 1 MG CAPS Take by mouth.     Continuous Blood Gluc Sensor (FREESTYLE LIBRE 2 SENSOR) MISC APPLY EVERY 14 DAYS, CHECK GLUCOSE BEFORE EACH MEAL, 2 HRS AFTER, IN THE MORNING AND BEFORE  BEDTIME     DULoxetine (CYMBALTA) 60 MG capsule Take 60 mg by mouth daily. (Patient not taking: Reported on 12/22/2020)     fentaNYL (DURAGESIC) 100 MCG/HR Place 1 patch onto the skin every 3 (three) days. 10 patch 0   HYDROmorphone (DILAUDID) 4 MG tablet Take 1 tablet (4 mg total) by mouth every 2 (two) hours as needed for severe pain. 90 tablet 0   insulin lispro (HUMALOG) 100 UNIT/ML KwikPen Junior Inject 0.2 mLs (20 Units total) into the skin 3 (three) times daily after meals. (Patient taking differently: Inject 7-25 Units into the skin 3 (three) times daily after meals. Per sliding scale) 3 mL 6   lidocaine (LIDODERM) 5 % Place 2 patches onto the skin daily. Remove & Discard patch within 12 hours or as directed by MD 30 patch 0   lidocaine-prilocaine (EMLA) cream Apply 1 application topically as needed. 30 g 0   loperamide (IMODIUM) 1 MG/5ML solution Take 2 mg by mouth daily as needed for diarrhea or loose stools.     magnesium 30 MG tablet Take 30 mg by mouth 2 (two) times daily.     Menthol-Camphor (TIGER BALM ARTHRITIS RUB EX) Apply 1 application topically 2 (two) times daily as needed (back pain).     ondansetron (ZOFRAN) 4 MG tablet TAKE 1 TABLET BY MOUTH EVERY 6 HOURS AS NEEDED. FOR NAUSEA (Patient taking differently: Take 4 mg by mouth every 8 (eight) hours  as needed for nausea or vomiting.) 20 tablet 1   prochlorperazine (COMPAZINE) 10 MG tablet TAKE 1 TABLET BY MOUTH EVERY 6 HOURS AS NEEDED FOR NAUSEA OR VOMITING. 30 tablet 0   rosuvastatin (CRESTOR) 10 MG tablet Take 1 tablet (10 mg total) by mouth daily. (Patient taking differently: Take 10 mg by mouth every evening.) 90 tablet 3   TRESIBA FLEXTOUCH 100 UNIT/ML FlexTouch Pen Inject 36 Units into the skin at bedtime. 15 mL 3   No current facility-administered medications for this visit.     Allergies:  Allergies  Allergen Reactions   Chlorhexidine       Physical Exam:  Blood pressure 136/66, pulse (!) 48, temperature 97.9  F (36.6 C), temperature source Temporal, resp. rate 18, height 6' (1.829 m), weight 212 lb 3.2 oz (96.3 kg), SpO2 100 %.             ECOG: 2     General appearance: Comfortable appearing without any discomfort Head: Normocephalic without any trauma Oropharynx: Mucous membranes are moist and pink without any thrush or ulcers. Eyes: Pupils are equal and round reactive to light. Lymph nodes: No cervical, supraclavicular, inguinal or axillary lymphadenopathy.   Heart:regular rate and rhythm.  S1 and S2 without leg edema. Lung: Clear without any rhonchi or wheezes.  No dullness to percussion. Abdomin: Soft, nontender, nondistended with good bowel sounds.  No hepatosplenomegaly. Musculoskeletal: No joint deformity or effusion.  Full range of motion noted. Neurological: No deficits noted on motor, sensory and deep tendon reflex exam. Skin: No petechial rash or dryness.  Appeared moist.                  Lab Results: Lab Results  Component Value Date   WBC 4.9 12/28/2020   HGB 7.0 (L) 12/28/2020   HCT 23.2 (L) 12/28/2020   MCV 90.3 12/28/2020   PLT 314 12/28/2020     Chemistry      Component Value Date/Time   NA 137 12/28/2020 1330   K 3.9 12/28/2020 1330   CL 101 12/28/2020 1330   CO2 28 12/28/2020 1330   BUN 8 12/28/2020 1330   CREATININE 0.66 12/28/2020 1330      Component Value Date/Time   CALCIUM 8.1 (L) 12/28/2020 1330   ALKPHOS 454 (H) 12/28/2020 1330   AST 48 (H) 12/28/2020 1330   ALT 29 12/28/2020 1330   BILITOT <0.2 (L) 12/28/2020 1330            Latest Reference Range & Units 11/23/20 11:29 12/28/20 13:30  Prostate Specific Ag, Serum 0.0 - 4.0 ng/mL 155.0 (H) 382.0 (H)  (H): Data is abnormally high      Impression and Plan:   75 year old man with:  1.  Advanced prostate cancer with disease to the bone diagnosed in February 2022.  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   He is currently on Jevtana chemotherapy  without any major complications.  Risks and benefits of continuing this treatment were discussed at this time.  Potential complications that include nausea, vomiting, myelosuppression, neutropenia and possible sepsis were reiterated.  He is agreeable to continue at this time given his excellent clinical response and will continue to monitor his PSA.   2.  Androgen deprivation: He will receive Eligard today and repeated in 4 months.  Complication occluding hot flashes and weight gain were reviewed.    3.  Growth factor support: He is at risk of neutropenia and possible sepsis.  He will receive growth  factor support after each cycle.  4.  Prognosis and goals of care: Therapy remains palliative though aggressive measures are warranted given his reasonable performance status.   5.  Anemia: Related to chemotherapy and malignancy.  He will continue to receive supportive transfusion as needed.  His hemoglobin is adequate today and does not require transfusion.   6.  Bone pain: He is currently on Dilaudid only with fentanyl patch not needed given his excellent response to therapy.   7.  IV access: Port-A-Cath continues to be in use without any issues.   8. Follow-up: In 3 weeks for repeat follow-up.    30  minutes were dedicated to this encounter.  The time was spent on reviewing laboratory data, disease status update and addressing complications related to cancer and cancer therapy.  Zola Button, MD 12/8/20229:42 AM

## 2021-01-19 ENCOUNTER — Telehealth: Payer: Self-pay | Admitting: *Deleted

## 2021-01-19 ENCOUNTER — Other Ambulatory Visit: Payer: Self-pay | Admitting: *Deleted

## 2021-01-19 DIAGNOSIS — C61 Malignant neoplasm of prostate: Secondary | ICD-10-CM

## 2021-01-19 DIAGNOSIS — Z95828 Presence of other vascular implants and grafts: Secondary | ICD-10-CM

## 2021-01-19 LAB — PROSTATE-SPECIFIC AG, SERUM (LABCORP): Prostate Specific Ag, Serum: 167 ng/mL — ABNORMAL HIGH (ref 0.0–4.0)

## 2021-01-19 MED ORDER — LIDOCAINE-PRILOCAINE 2.5-2.5 % EX CREA: 1.0000 "application " | TOPICAL_CREAM | CUTANEOUS | 0 refills | Status: DC | PRN

## 2021-01-19 MED ORDER — HYDROMORPHONE HCL 4 MG PO TABS: 4.0000 mg | ORAL_TABLET | ORAL | 0 refills | Status: DC | PRN

## 2021-01-19 MED ORDER — ONDANSETRON HCL 4 MG PO TABS: 4.0000 mg | ORAL_TABLET | Freq: Four times a day (QID) | ORAL | 1 refills | Status: DC | PRN

## 2021-01-19 NOTE — Telephone Encounter (Signed)
-----   Message from [name], MD sent at 01/19/2021  8:21 AM EST ----- Please let him know his PSA is down

## 2021-01-19 NOTE — Telephone Encounter (Signed)
PC to patient, spoke with his wife [name] - informed her his PSA is 167.  She verbalizes understanding.

## 2021-01-20 ENCOUNTER — Inpatient Hospital Stay: Payer: Medicare Other

## 2021-01-20 ENCOUNTER — Other Ambulatory Visit: Payer: Self-pay

## 2021-01-20 VITALS — BP 131/67 | HR 82 | Temp 97.2°F | Resp 18

## 2021-01-20 DIAGNOSIS — Z5111 Encounter for antineoplastic chemotherapy: Secondary | ICD-10-CM | POA: Diagnosis not present

## 2021-01-20 DIAGNOSIS — C61 Malignant neoplasm of prostate: Secondary | ICD-10-CM | POA: Diagnosis not present

## 2021-01-20 DIAGNOSIS — D6481 Anemia due to antineoplastic chemotherapy: Secondary | ICD-10-CM | POA: Diagnosis not present

## 2021-01-20 DIAGNOSIS — D630 Anemia in neoplastic disease: Secondary | ICD-10-CM | POA: Diagnosis not present

## 2021-01-20 DIAGNOSIS — Z192 Hormone resistant malignancy status: Secondary | ICD-10-CM | POA: Diagnosis not present

## 2021-01-20 DIAGNOSIS — Z79818 Long term (current) use of other agents affecting estrogen receptors and estrogen levels: Secondary | ICD-10-CM | POA: Diagnosis not present

## 2021-01-20 MED ORDER — PEGFILGRASTIM-CBQV 6 MG/0.6ML ~~LOC~~ SOSY
6.0000 mg | PREFILLED_SYRINGE | Freq: Once | SUBCUTANEOUS | Status: AC
  Administered 2021-01-20: 6 mg via SUBCUTANEOUS
  Filled 2021-01-20: qty 0.6

## 2021-01-20 NOTE — Patient Instructions (Signed)

## 2021-01-23 ENCOUNTER — Other Ambulatory Visit: Payer: Self-pay | Admitting: *Deleted

## 2021-01-23 DIAGNOSIS — C7951 Secondary malignant neoplasm of bone: Secondary | ICD-10-CM | POA: Diagnosis not present

## 2021-01-23 DIAGNOSIS — C61 Malignant neoplasm of prostate: Secondary | ICD-10-CM | POA: Diagnosis not present

## 2021-01-23 DIAGNOSIS — G9341 Metabolic encephalopathy: Secondary | ICD-10-CM | POA: Diagnosis not present

## 2021-01-23 DIAGNOSIS — F0390 Unspecified dementia without behavioral disturbance: Secondary | ICD-10-CM | POA: Diagnosis not present

## 2021-01-23 DIAGNOSIS — E119 Type 2 diabetes mellitus without complications: Secondary | ICD-10-CM | POA: Diagnosis not present

## 2021-01-23 DIAGNOSIS — D630 Anemia in neoplastic disease: Secondary | ICD-10-CM | POA: Diagnosis not present

## 2021-01-23 NOTE — Patient Outreach (Signed)
[facility]) Care Management  [place]  01/23/2021   Stephen Lynch 17-May-1945 [id number]   Outgoing call placed to member's significant other, successful.  Denies any urgent concerns, encouraged to contact this care manager with questions.    Encounter Medications:  Outpatient Encounter Medications as of 01/23/2021  Medication Sig   ASPIRIN 81 PO Take 81 mg by mouth daily.    Calcium-Magnesium-Zinc 333-133-5 MG TABS Take 1 tablet by mouth daily.   Chromium 1 MG CAPS Take by mouth.   Continuous Blood Gluc Sensor (FREESTYLE LIBRE 2 SENSOR) MISC APPLY EVERY 14 DAYS, CHECK GLUCOSE BEFORE EACH MEAL, 2 HRS AFTER, IN THE MORNING AND BEFORE BEDTIME   DULoxetine (CYMBALTA) 60 MG capsule Take 60 mg by mouth daily. (Patient not taking: Reported on 12/22/2020)   HYDROmorphone (DILAUDID) 4 MG tablet Take 1 tablet (4 mg total) by mouth every 2 (two) hours as needed for severe pain.   insulin lispro (HUMALOG) 100 UNIT/ML KwikPen Junior Inject 0.2 mLs (20 Units total) into the skin 3 (three) times daily after meals. (Patient taking differently: Inject 7-25 Units into the skin 3 (three) times daily after meals. Per sliding scale)   lidocaine (LIDODERM) 5 % Place 2 patches onto the skin daily. Remove & Discard patch within 12 hours or as directed by MD   lidocaine-prilocaine (EMLA) cream Apply 1 application topically as needed.   loperamide (IMODIUM) 1 MG/5ML solution Take 2 mg by mouth daily as needed for diarrhea or loose stools.   magnesium 30 MG tablet Take 30 mg by mouth 2 (two) times daily.   Menthol-Camphor (TIGER BALM ARTHRITIS RUB EX) Apply 1 application topically 2 (two) times daily as needed (back pain).   ondansetron (ZOFRAN) 4 MG tablet Take 1 tablet (4 mg total) by mouth every 6 (six) hours as needed. for nausea   prochlorperazine (COMPAZINE) 10 MG tablet TAKE 1 TABLET BY MOUTH EVERY 6 HOURS AS NEEDED FOR NAUSEA OR VOMITING.   rosuvastatin (CRESTOR) 10 MG tablet  Take 1 tablet (10 mg total) by mouth daily. (Patient taking differently: Take 10 mg by mouth every evening.)   TRESIBA FLEXTOUCH 100 UNIT/ML FlexTouch Pen Inject 36 Units into the skin at bedtime.   No facility-administered encounter medications on file as of 01/23/2021.    Functional Status:  In your present state of health, do you have any difficulty performing the following activities: 12/07/2020  Hearing? N  Vision? N  Difficulty concentrating or making decisions? Y  Walking or climbing stairs? Y  Dressing or bathing? Y  Doing errands, shopping? Y  Some recent data might be hidden    Fall/Depression Screening: Fall Risk  07/24/2020 03/28/2020 04/01/2019  Falls in the past year? 0 0 0  Number falls in past yr: - 0 0  Injury with Fall? - 0 0  Risk for fall due to : - - No Fall Risks  Follow up - - Falls prevention discussed   PHQ 2/9 Scores 07/24/2020 04/01/2019  PHQ - 2 Score 0 0    Assessment:   Care Plan Care Plan : RNCM Plan of Care  Updates made by [name], RN since 01/23/2021 12:00 AM     Problem: Knoweldge deficit regarding management of health conditions, prostate cancer and DM   Priority: High     Long-Range Goal: Member will show adequate management of chronic conditions, Prostate Cancer and DM (goal altered to include cancer)   Start Date: 10/27/2020  Expected End Date: 04/26/2021  Recent Progress: On track  Priority: High  Note:   Current Barriers:  Knowledge Deficits related to plan of care for management of DMII and Prostate cancer  Chronic Disease Management support and education needs related to DMII and Prostate cancer Transportation barriers Non-adherence to prescribed medication regimen  RNCM Clinical Goal(s):  Patient will verbalize understanding of plan for management of DMII and Prostate cancer as evidenced by no hospital admissions take all medications exactly as prescribed and will call provider for medication related questions as  evidenced by reported adherence by member and wife    demonstrate understanding of rationale for each prescribed medication as evidenced by Insulin [name])    attend all scheduled medical appointments: PCP, oncology, and endocrinology as evidenced by record of attendance at provider offices        continue to work with RN Care Manager and/or Social Worker to address care management and care coordination needs related to DMII and prostate cancer as evidenced by adherence to CM Team Scheduled appointments     demonstrate ongoing self health care management ability to manage DM, including diet, medication management, and activity as evidenced by     through collaboration with RN Care manager, provider, and care team.   Interventions: Inter-disciplinary care team collaboration (see longitudinal plan of care) Evaluation of current treatment plan related to  self management and patient's adherence to plan as established by provider   Diabetes:  (Status: Goal on track: YES.) Lab Results  Component Value Date   HGBA1C 8.5 (H) 08/18/2020  Assessed patient's understanding of A1c goal: <7% Provided education to patient about basic DM disease process; Reviewed medications with patient and discussed importance of medication adherence;        Reviewed prescribed diet with patient low sugars, low carbs; Reviewed scheduled/upcoming provider appointments including: Endocrinology on 10/5;         Advised patient, providing education and rationale, to check cbg 2-3 times a day and record        Assessed social determinant of health barriers;         Oncology:  (Status: Goal on track:  Yes.) Assessment of understanding of oncology diagnosis:  Assessed patient understanding of cancer diagnosis and recommended treatment plan, Reviewed upcoming provider appointments and treatment appointments, and Assessed support system. Has consistent/reliable family or other support: Yes   Update 11/11 - Significant other  report member has been "out of it" as his cancer is progressing and requiring more pain medication for relief.  He has been seen by [facility], now active with the home based palliative care team.  Report he has not been able to stay mobile due to the pain, decreased appetite, drinking boost for nutritional support.  Blood sugars have been elevated as he has not been taking medication appropriately due to not being alert enough.  She has been calls to endocrinology for assistance, changes in insulin noted.  She is not happy with their experience at current PCP, looking to change providers however state it is now difficult to get member out of the house on some days.  Discussed having provider visit the home, advised of the [facility] program, she agrees to referral.    Update 11/21 - Spoke to significant other, confirms that [facility] made home visit on Friday 11/18.  Call placed to [facility] Cecille Rubin), confirmed that they will now be acting as member's primary provider.  They will make another home visit tomorrow to monitor member's status as member has recently  had round of chemo.  She continues to have a hard time with managing member's blood sugars, follow labs and appointment with endocrinology scheduled for the next couple weeks.    Patient Goals/Self-Care Activities: Patient will self administer medications as prescribed Patient will attend all scheduled provider appointments Patient will call pharmacy for medication refills Patient will call provider office for new concerns or questions    Update 12/13 - Significant other state member is doing better than he has been the last couple weeks.  He has restarted chemo, PSA has decreased from 382 to 167.  Follow up with oncology on 12/29.  His appetite has increased however they were still having intermittent trouble with diabetes management.  Significant other called endocrinology office to report hypoglycemic episodes, lunch time insulin was  decreased.  Since, readings have ranged from 160's-240.  Follow up with endocrinology on 1/5.        Plan:  Follow-up: Patient agrees to Care Plan and Follow-up. Follow-up in 1 month(s).  [name], [place], MSN [facility] [phone]

## 2021-01-25 DIAGNOSIS — C7951 Secondary malignant neoplasm of bone: Secondary | ICD-10-CM | POA: Diagnosis not present

## 2021-01-25 DIAGNOSIS — C61 Malignant neoplasm of prostate: Secondary | ICD-10-CM | POA: Diagnosis not present

## 2021-01-25 DIAGNOSIS — D630 Anemia in neoplastic disease: Secondary | ICD-10-CM | POA: Diagnosis not present

## 2021-01-25 DIAGNOSIS — G9341 Metabolic encephalopathy: Secondary | ICD-10-CM | POA: Diagnosis not present

## 2021-01-25 DIAGNOSIS — E119 Type 2 diabetes mellitus without complications: Secondary | ICD-10-CM | POA: Diagnosis not present

## 2021-01-25 DIAGNOSIS — F0390 Unspecified dementia without behavioral disturbance: Secondary | ICD-10-CM | POA: Diagnosis not present

## 2021-01-29 ENCOUNTER — Other Ambulatory Visit (HOSPITAL_COMMUNITY): Payer: Medicare Other

## 2021-01-30 DIAGNOSIS — C7951 Secondary malignant neoplasm of bone: Secondary | ICD-10-CM | POA: Diagnosis not present

## 2021-01-30 DIAGNOSIS — G9341 Metabolic encephalopathy: Secondary | ICD-10-CM | POA: Diagnosis not present

## 2021-01-30 DIAGNOSIS — F0390 Unspecified dementia without behavioral disturbance: Secondary | ICD-10-CM | POA: Diagnosis not present

## 2021-01-30 DIAGNOSIS — C61 Malignant neoplasm of prostate: Secondary | ICD-10-CM | POA: Diagnosis not present

## 2021-01-30 DIAGNOSIS — D630 Anemia in neoplastic disease: Secondary | ICD-10-CM | POA: Diagnosis not present

## 2021-01-30 DIAGNOSIS — E119 Type 2 diabetes mellitus without complications: Secondary | ICD-10-CM | POA: Diagnosis not present

## 2021-01-31 DIAGNOSIS — F0390 Unspecified dementia without behavioral disturbance: Secondary | ICD-10-CM | POA: Diagnosis not present

## 2021-01-31 DIAGNOSIS — G9341 Metabolic encephalopathy: Secondary | ICD-10-CM | POA: Diagnosis not present

## 2021-01-31 DIAGNOSIS — C7951 Secondary malignant neoplasm of bone: Secondary | ICD-10-CM | POA: Diagnosis not present

## 2021-01-31 DIAGNOSIS — E119 Type 2 diabetes mellitus without complications: Secondary | ICD-10-CM | POA: Diagnosis not present

## 2021-01-31 DIAGNOSIS — C61 Malignant neoplasm of prostate: Secondary | ICD-10-CM | POA: Diagnosis not present

## 2021-01-31 DIAGNOSIS — D630 Anemia in neoplastic disease: Secondary | ICD-10-CM | POA: Diagnosis not present

## 2021-02-01 DIAGNOSIS — C7951 Secondary malignant neoplasm of bone: Secondary | ICD-10-CM | POA: Diagnosis not present

## 2021-02-01 DIAGNOSIS — D630 Anemia in neoplastic disease: Secondary | ICD-10-CM | POA: Diagnosis not present

## 2021-02-01 DIAGNOSIS — C61 Malignant neoplasm of prostate: Secondary | ICD-10-CM | POA: Diagnosis not present

## 2021-02-01 DIAGNOSIS — F0390 Unspecified dementia without behavioral disturbance: Secondary | ICD-10-CM | POA: Diagnosis not present

## 2021-02-01 DIAGNOSIS — G9341 Metabolic encephalopathy: Secondary | ICD-10-CM | POA: Diagnosis not present

## 2021-02-01 DIAGNOSIS — E119 Type 2 diabetes mellitus without complications: Secondary | ICD-10-CM | POA: Diagnosis not present

## 2021-02-08 ENCOUNTER — Inpatient Hospital Stay (HOSPITAL_BASED_OUTPATIENT_CLINIC_OR_DEPARTMENT_OTHER): Payer: Medicare Other | Admitting: Oncology

## 2021-02-08 ENCOUNTER — Other Ambulatory Visit: Payer: Self-pay

## 2021-02-08 ENCOUNTER — Inpatient Hospital Stay: Payer: Medicare Other

## 2021-02-08 VITALS — BP 147/70 | HR 82 | Temp 98.7°F | Resp 18 | Wt 217.7 lb

## 2021-02-08 DIAGNOSIS — C61 Malignant neoplasm of prostate: Secondary | ICD-10-CM | POA: Diagnosis not present

## 2021-02-08 DIAGNOSIS — Z95828 Presence of other vascular implants and grafts: Secondary | ICD-10-CM

## 2021-02-08 DIAGNOSIS — D649 Anemia, unspecified: Secondary | ICD-10-CM

## 2021-02-08 DIAGNOSIS — D630 Anemia in neoplastic disease: Secondary | ICD-10-CM | POA: Diagnosis not present

## 2021-02-08 DIAGNOSIS — D6481 Anemia due to antineoplastic chemotherapy: Secondary | ICD-10-CM | POA: Diagnosis not present

## 2021-02-08 DIAGNOSIS — Z79818 Long term (current) use of other agents affecting estrogen receptors and estrogen levels: Secondary | ICD-10-CM | POA: Diagnosis not present

## 2021-02-08 DIAGNOSIS — Z192 Hormone resistant malignancy status: Secondary | ICD-10-CM | POA: Diagnosis not present

## 2021-02-08 DIAGNOSIS — Z5111 Encounter for antineoplastic chemotherapy: Secondary | ICD-10-CM | POA: Diagnosis not present

## 2021-02-08 LAB — CMP (CANCER CENTER ONLY)
ALT: 46 U/L — ABNORMAL HIGH (ref 0–44)
AST: 58 U/L — ABNORMAL HIGH (ref 15–41)
Albumin: 3.8 g/dL (ref 3.5–5.0)
Alkaline Phosphatase: 440 U/L — ABNORMAL HIGH (ref 38–126)
Anion gap: 6 (ref 5–15)
BUN: 21 mg/dL (ref 8–23)
CO2: 26 mmol/L (ref 22–32)
Calcium: 8.9 mg/dL (ref 8.9–10.3)
Chloride: 105 mmol/L (ref 98–111)
Creatinine: 0.82 mg/dL (ref 0.61–1.24)
GFR, Estimated: >60 mL/min (ref >60)
Glucose, Bld: 108 mg/dL — ABNORMAL HIGH (ref 70–99)
Potassium: 4.4 mmol/L (ref 3.5–5.1)
Sodium: 137 mmol/L (ref 135–145)
Total Bilirubin: 0.7 mg/dL (ref 0.3–1.2)
Total Protein: 7.2 g/dL (ref 6.5–8.1)

## 2021-02-08 LAB — CBC WITH DIFFERENTIAL (CANCER CENTER ONLY)
Abs Immature Granulocytes: 0.04 10*3/uL (ref 0.00–0.07)
Basophils Absolute: 0 10*3/uL (ref 0.0–0.1)
Basophils Relative: 0 %
Eosinophils Absolute: 0 10*3/uL (ref 0.0–0.5)
Eosinophils Relative: 1 %
HCT: 27.9 % — ABNORMAL LOW (ref 39.0–52.0)
Hemoglobin: 8.6 g/dL — ABNORMAL LOW (ref 13.0–17.0)
Immature Granulocytes: 1 %
Lymphocytes Relative: 20 %
Lymphs Abs: 1.2 10*3/uL (ref 0.7–4.0)
MCH: 30.9 pg (ref 26.0–34.0)
MCHC: 30.8 g/dL (ref 30.0–36.0)
MCV: 100.4 fL — ABNORMAL HIGH (ref 80.0–100.0)
Monocytes Absolute: 0.8 10*3/uL (ref 0.1–1.0)
Monocytes Relative: 12 %
Neutro Abs: 4.2 10*3/uL (ref 1.7–7.7)
Neutrophils Relative %: 66 %
Platelet Count: 146 10*3/uL — ABNORMAL LOW (ref 150–400)
RBC: 2.78 MIL/uL — ABNORMAL LOW (ref 4.22–5.81)
RDW: 23.8 % — ABNORMAL HIGH (ref 11.5–15.5)
WBC Count: 6.3 10*3/uL (ref 4.0–10.5)
nRBC: 0.3 % — ABNORMAL HIGH (ref 0.0–0.2)

## 2021-02-08 LAB — SAMPLE TO BLOOD BANK

## 2021-02-08 MED ORDER — PROCHLORPERAZINE MALEATE 10 MG PO TABS
10.0000 mg | ORAL_TABLET | Freq: Four times a day (QID) | ORAL | 3 refills | Status: DC | PRN
Start: 2021-02-08 — End: 2021-03-01

## 2021-02-08 MED ORDER — DIPHENHYDRAMINE HCL 50 MG/ML IJ SOLN
25.0000 mg | Freq: Once | INTRAMUSCULAR | Status: AC
  Administered 2021-02-08: 13:00:00 25 mg via INTRAVENOUS
  Filled 2021-02-08: qty 1

## 2021-02-08 MED ORDER — SODIUM CHLORIDE 0.9 % IV SOLN
10.0000 mg | Freq: Once | INTRAVENOUS | Status: AC
  Administered 2021-02-08: 14:00:00 10 mg via INTRAVENOUS
  Filled 2021-02-08: qty 10

## 2021-02-08 MED ORDER — SODIUM CHLORIDE 0.9 % IV SOLN: Freq: Once | INTRAVENOUS | Status: AC

## 2021-02-08 MED ORDER — SODIUM CHLORIDE 0.9% FLUSH
10.0000 mL | INTRAVENOUS | Status: DC | PRN
  Administered 2021-02-08: 16:00:00 10 mL

## 2021-02-08 MED ORDER — SODIUM CHLORIDE 0.9 % IV SOLN
20.0000 mg/m2 | Freq: Once | INTRAVENOUS | Status: AC
  Administered 2021-02-08: 15:00:00 44 mg via INTRAVENOUS
  Filled 2021-02-08: qty 4.4

## 2021-02-08 MED ORDER — OXYCODONE HCL 10 MG PO TABS
10.0000 mg | ORAL_TABLET | ORAL | 0 refills | Status: DC | PRN
Start: 2021-02-08 — End: 2021-03-01

## 2021-02-08 MED ORDER — HEPARIN SOD (PORK) LOCK FLUSH 100 UNIT/ML IV SOLN
500.0000 [IU] | Freq: Once | INTRAVENOUS | Status: AC | PRN
  Administered 2021-02-08: 16:00:00 500 [IU]

## 2021-02-08 MED ORDER — SODIUM CHLORIDE 0.9% FLUSH
10.0000 mL | INTRAVENOUS | Status: DC | PRN
  Administered 2021-02-08: 12:00:00 10 mL via INTRAVENOUS

## 2021-02-08 MED ORDER — FAMOTIDINE 20 MG IN NS 100 ML IVPB
20.0000 mg | Freq: Once | INTRAVENOUS | Status: AC
  Administered 2021-02-08: 14:00:00 20 mg via INTRAVENOUS
  Filled 2021-02-08: qty 100

## 2021-02-08 NOTE — Patient Instructions (Signed)
[facility]COL[facility]  Discharge Instructions: Thank you for choosing [facility] to provide your oncology and hematology care.   If you have a lab appointment with the [facility], please go directly to the [facility] and check in at the registration area.   Wear comfortable clothing and clothing appropriate for easy access to any Portacath or PICC line.   We strive to give you quality time with your provider. You may need to reschedule your appointment if you arrive late (15 or more minutes).  Arriving late affects you and other patients whose appointments are after yours.  Also, if you miss three or more appointments without notifying the office, you may be dismissed from the clinic at the providers discretion.      For prescription refill requests, have your pharmacy contact our office and allow 72 hours for refills to be completed.    Today you received the following chemotherapy and/or immunotherapy agents: Jevtana      To help prevent nausea and vomiting after your treatment, we encourage you to take your nausea medication as directed.  BELOW ARE SYMPTOMS THAT SHOULD BE REPORTED IMMEDIATELY: *FEVER GREATER THAN 100.4 F (38 C) OR HIGHER *CHILLS OR SWEATING *NAUSEA AND VOMITING THAT IS NOT CONTROLLED WITH YOUR NAUSEA MEDICATION *UNUSUAL SHORTNESS OF BREATH *UNUSUAL BRUISING OR BLEEDING *URINARY PROBLEMS (pain or burning when urinating, or frequent urination) *BOWEL PROBLEMS (unusual diarrhea, constipation, pain near the anus) TENDERNESS IN MOUTH AND THROAT WITH OR WITHOUT PRESENCE OF ULCERS (sore throat, sores in mouth, or a toothache) UNUSUAL RASH, SWELLING OR PAIN  UNUSUAL VAGINAL DISCHARGE OR ITCHING   Items with * indicate a potential emergency and should be followed up as soon as possible or go to the Emergency Department if any problems should occur.  Please show the CHEMOTHERAPY ALERT CARD or IMMUNOTHERAPY ALERT CARD at check-in to  the Emergency Department and triage nurse.  Should you have questions after your visit or need to cancel or reschedule your appointment, please contact [facility]  Dept: [phone]  and follow the prompts.  Office hours are 8:00 a.m. to 4:30 p.m. Monday - Friday. Please note that voicemails left after 4:00 p.m. may not be returned until the following business day.  We are closed weekends and major holidays. You have access to a nurse at all times for urgent questions. Please call the main number to the clinic Dept: [phone] and follow the prompts.   For any non-urgent questions, you may also contact your provider using MyChart. We now offer e-Visits for anyone 29 and older to request care online for non-urgent symptoms. For details visit [email or web address].   Also download the MyChart app! Go to the app store, search "MyChart", open the app, select [facility], and log in with your MyChart username and password.  Due to Covid, a mask is required upon entering the hospital/clinic. If you do not have a mask, one will be given to you upon arrival. For doctor visits, patients may have 1 support person aged 55 or older with them. For treatment visits, patients cannot have anyone with them due to current Covid guidelines and our immunocompromised population.

## 2021-02-08 NOTE — Progress Notes (Signed)
Hematology and Oncology Follow Up Visit  Kimberly Coye [id number] 24-Sep-1945 75 y.o. 02/08/2021 11:50 AM [name], [name], [name], MD   Principle Diagnosis: 75 year old man with castration-resistant advanced prostate cancer with disease to the bone diagnosed in February 2021.    Prior Therapy:  He status post CT-guided biopsy of the right iliac bone.  [name] started on April 20, 2019.  Xtandi 160 mg daily started in April 2021.  Therapy discontinued in March 2022 due to progression of disease.  Zytiga 1000 mg daily with prednisone 5 mg daily started in March 2022.  Taxotere chemotherapy 75 mg per metered square started on June 08, 2020.  He is here for cycle 9 of therapy.  Current therapy:    Eligard 30 mg started in April 2021.  Next Eligard will be given in April 2023.  Jevtana chemotherapy 20 mg per metered square every 3 weeks started on December 28, 2020.  He is here for cycle 3 of therapy.   Interim History: Mr. Woo returns today for a follow-up visit.  Since the last visit, he reports continuous improvement in his overall health and pain levels.  His mobility is improved and he is eating better.  He has gained 5 pounds since the last visit.  He denies any nausea, vomiting or abdominal pain.  He denies any complications related to [facility].  He denies any worsening neuropathy or excessive fatigue.  His quality of life dramatically improved with chemotherapy.   Medications: Updated on review. Current Outpatient Medications  Medication Sig Dispense Refill   ASPIRIN 81 PO Take 81 mg by mouth daily.      Calcium-Magnesium-Zinc 333-133-5 MG TABS Take 1 tablet by mouth daily.     Chromium 1 MG CAPS Take by mouth.     Continuous Blood Gluc Sensor (FREESTYLE LIBRE 2 SENSOR) MISC APPLY EVERY 14 DAYS, CHECK GLUCOSE BEFORE EACH MEAL, 2 HRS AFTER, IN THE MORNING AND BEFORE BEDTIME     DULoxetine (CYMBALTA) 60 MG capsule Take 60 mg by mouth daily. (Patient not taking: Reported on  12/22/2020)     HYDROmorphone (DILAUDID) 4 MG tablet Take 1 tablet (4 mg total) by mouth every 2 (two) hours as needed for severe pain. 90 tablet 0   insulin lispro (HUMALOG) 100 UNIT/ML KwikPen Junior Inject 0.2 mLs (20 Units total) into the skin 3 (three) times daily after meals. (Patient taking differently: Inject 7-25 Units into the skin 3 (three) times daily after meals. Per sliding scale) 3 mL 6   lidocaine (LIDODERM) 5 % Place 2 patches onto the skin daily. Remove & Discard patch within 12 hours or as directed by MD 30 patch 0   lidocaine-prilocaine (EMLA) cream Apply 1 application topically as needed. 30 g 0   loperamide (IMODIUM) 1 MG/5ML solution Take 2 mg by mouth daily as needed for diarrhea or loose stools.     magnesium 30 MG tablet Take 30 mg by mouth 2 (two) times daily.     Menthol-Camphor (TIGER BALM ARTHRITIS RUB EX) Apply 1 application topically 2 (two) times daily as needed (back pain).     ondansetron (ZOFRAN) 4 MG tablet Take 1 tablet (4 mg total) by mouth every 6 (six) hours as needed. for nausea 20 tablet 1   prochlorperazine (COMPAZINE) 10 MG tablet TAKE 1 TABLET BY MOUTH EVERY 6 HOURS AS NEEDED FOR NAUSEA OR VOMITING. 30 tablet 0   rosuvastatin (CRESTOR) 10 MG tablet Take 1 tablet (10 mg total) by mouth daily. (Patient taking differently:  Take 10 mg by mouth every evening.) 90 tablet 3   TRESIBA FLEXTOUCH 100 UNIT/ML FlexTouch Pen Inject 36 Units into the skin at bedtime. 15 mL 3   No current facility-administered medications for this visit.   Facility-Administered Medications Ordered in Other Visits  Medication Dose Route Frequency Provider Last Rate Last Admin   sodium chloride flush (NS) 0.9 % injection 10 mL  10 mL Intravenous PRN [name], MD         Allergies:  Allergies  Allergen Reactions   Chlorhexidine       Physical Exam:        Blood pressure (!) 147/70, pulse 82, temperature 98.7 F (37.1 C), temperature source Oral, resp. rate 18,  weight 217 lb 11.2 oz (98.7 kg), SpO2 98 %.        ECOG: 2    General appearance: Alert, awake without any distress. Head: Atraumatic without abnormalities Oropharynx: Without any thrush or ulcers. Eyes: No scleral icterus. Lymph nodes: No lymphadenopathy noted in the cervical, supraclavicular, or axillary nodes Heart:regular rate and rhythm, without any murmurs or gallops.   Lung: Clear to auscultation without any rhonchi, wheezes or dullness to percussion. Abdomin: Soft, nontender without any shifting dullness or ascites. Musculoskeletal: No clubbing or cyanosis. Neurological: No motor or sensory deficits. Skin: No rashes or lesions.                  Lab Results: Lab Results  Component Value Date   WBC 5.5 01/18/2021   HGB 8.4 (L) 01/18/2021   HCT 27.8 (L) 01/18/2021   MCV 95.9 01/18/2021   PLT 161 01/18/2021     Chemistry      Component Value Date/Time   NA 140 01/18/2021 1005   K 4.2 01/18/2021 1005   CL 107 01/18/2021 1005   CO2 24 01/18/2021 1005   BUN 12 01/18/2021 1005   CREATININE 0.74 01/18/2021 1005      Component Value Date/Time   CALCIUM 8.3 (L) 01/18/2021 1005   ALKPHOS 822 (H) 01/18/2021 1005   AST 34 01/18/2021 1005   ALT 27 01/18/2021 1005   BILITOT 0.5 01/18/2021 1005            Latest Reference Range & Units 12/28/20 13:30 01/18/21 10:05  Prostate Specific Ag, Serum 0.0 - 4.0 ng/mL 382.0 (H) 167.0 (H)  (H): Data is abnormally high     Impression and Plan:   75 year old man with:  1.  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     He has tolerated Jevtana chemotherapy without any major complications.  His PSA showed an excellent response after the first cycle of therapy with also improvement in his clinical status.  Risks and benefits of continuing this treatment were discussed at this time.  Complications that include nausea, vomiting, neuropathy and  infusion related issues were reviewed.  He is agreeable to continue at this time.  2.  Androgen deprivation: Next Eligard will be given in April 2023.  Long-term complications including weight gain and hot flashes among others were reiterated.    3.  Growth factor support: He will receive growth factor support after each cycle of therapy.  Given his previous heavy treatment history, he is at high risk of developing neutropenia and sepsis.  4.  Prognosis and goals of care: His disease is incurable although aggressive measures are warranted.  Performance status remains adequate to proceed with treatment.   5.  Anemia: Related  to malignancy and continues to improve at this time.  Hemoglobin today is 8.6 and does not require any transfusion.   6.  Bone pain: He has required less Dilaudid and prefers to switch to oxycodone which I will refill for him.   7.  IV access: Port-A-Cath remains in use without any issues.  8. Follow-up: He will return in 3 weeks for a follow-up visit.    30  minutes were spent on this visit.  Time was dedicated to reviewing laboratory data, disease status update, treatment choices and future plan of care discussion.  Zola Button, MD 12/29/202211:50 AM

## 2021-02-09 ENCOUNTER — Encounter: Payer: Self-pay | Admitting: Oncology

## 2021-02-09 ENCOUNTER — Telehealth: Payer: Self-pay | Admitting: *Deleted

## 2021-02-09 DIAGNOSIS — C61 Malignant neoplasm of prostate: Secondary | ICD-10-CM | POA: Diagnosis not present

## 2021-02-09 DIAGNOSIS — D630 Anemia in neoplastic disease: Secondary | ICD-10-CM | POA: Diagnosis not present

## 2021-02-09 DIAGNOSIS — E119 Type 2 diabetes mellitus without complications: Secondary | ICD-10-CM | POA: Diagnosis not present

## 2021-02-09 DIAGNOSIS — G9341 Metabolic encephalopathy: Secondary | ICD-10-CM | POA: Diagnosis not present

## 2021-02-09 DIAGNOSIS — F0390 Unspecified dementia without behavioral disturbance: Secondary | ICD-10-CM | POA: Diagnosis not present

## 2021-02-09 DIAGNOSIS — C7951 Secondary malignant neoplasm of bone: Secondary | ICD-10-CM | POA: Diagnosis not present

## 2021-02-09 LAB — PROSTATE-SPECIFIC AG, SERUM (LABCORP): Prostate Specific Ag, Serum: 218 ng/mL — ABNORMAL HIGH (ref 0.0–4.0)

## 2021-02-09 NOTE — Telephone Encounter (Signed)
PC to patient, spoke with his wife [name] - informed her of Dr. Hazeline Junker message below.  She verbalizes understanding.

## 2021-02-09 NOTE — Telephone Encounter (Signed)
-----   Message from [name], MD sent at 02/09/2021  9:11 AM EST ----- Please let him know his PSA is up slightly but still overall down from before. No changes for now

## 2021-02-10 ENCOUNTER — Other Ambulatory Visit: Payer: Self-pay

## 2021-02-10 ENCOUNTER — Inpatient Hospital Stay: Payer: Medicare Other

## 2021-02-10 VITALS — BP 129/63 | HR 81 | Temp 98.3°F | Resp 17

## 2021-02-10 DIAGNOSIS — C61 Malignant neoplasm of prostate: Secondary | ICD-10-CM | POA: Diagnosis not present

## 2021-02-10 DIAGNOSIS — G9341 Metabolic encephalopathy: Secondary | ICD-10-CM | POA: Diagnosis not present

## 2021-02-10 DIAGNOSIS — Z87440 Personal history of urinary (tract) infections: Secondary | ICD-10-CM | POA: Diagnosis not present

## 2021-02-10 DIAGNOSIS — Z5111 Encounter for antineoplastic chemotherapy: Secondary | ICD-10-CM | POA: Diagnosis not present

## 2021-02-10 DIAGNOSIS — D630 Anemia in neoplastic disease: Secondary | ICD-10-CM | POA: Diagnosis not present

## 2021-02-10 DIAGNOSIS — F0390 Unspecified dementia without behavioral disturbance: Secondary | ICD-10-CM | POA: Diagnosis not present

## 2021-02-10 DIAGNOSIS — E785 Hyperlipidemia, unspecified: Secondary | ICD-10-CM | POA: Diagnosis not present

## 2021-02-10 DIAGNOSIS — I10 Essential (primary) hypertension: Secondary | ICD-10-CM | POA: Diagnosis not present

## 2021-02-10 DIAGNOSIS — Z794 Long term (current) use of insulin: Secondary | ICD-10-CM | POA: Diagnosis not present

## 2021-02-10 DIAGNOSIS — Z7982 Long term (current) use of aspirin: Secondary | ICD-10-CM | POA: Diagnosis not present

## 2021-02-10 DIAGNOSIS — Z192 Hormone resistant malignancy status: Secondary | ICD-10-CM | POA: Diagnosis not present

## 2021-02-10 DIAGNOSIS — Z79818 Long term (current) use of other agents affecting estrogen receptors and estrogen levels: Secondary | ICD-10-CM | POA: Diagnosis not present

## 2021-02-10 DIAGNOSIS — C7951 Secondary malignant neoplasm of bone: Secondary | ICD-10-CM | POA: Diagnosis not present

## 2021-02-10 DIAGNOSIS — D6481 Anemia due to antineoplastic chemotherapy: Secondary | ICD-10-CM | POA: Diagnosis not present

## 2021-02-10 DIAGNOSIS — Z9181 History of falling: Secondary | ICD-10-CM | POA: Diagnosis not present

## 2021-02-10 DIAGNOSIS — E119 Type 2 diabetes mellitus without complications: Secondary | ICD-10-CM | POA: Diagnosis not present

## 2021-02-10 MED ORDER — PEGFILGRASTIM-CBQV 6 MG/0.6ML ~~LOC~~ SOSY
6.0000 mg | PREFILLED_SYRINGE | Freq: Once | SUBCUTANEOUS | Status: AC
  Administered 2021-02-10: 6 mg via SUBCUTANEOUS
  Filled 2021-02-10: qty 0.6

## 2021-02-10 NOTE — Patient Instructions (Signed)

## 2021-02-13 ENCOUNTER — Other Ambulatory Visit: Payer: Self-pay

## 2021-02-13 ENCOUNTER — Other Ambulatory Visit (INDEPENDENT_AMBULATORY_CARE_PROVIDER_SITE_OTHER): Payer: Medicare Other

## 2021-02-13 DIAGNOSIS — E1165 Type 2 diabetes mellitus with hyperglycemia: Secondary | ICD-10-CM | POA: Diagnosis not present

## 2021-02-13 DIAGNOSIS — Z794 Long term (current) use of insulin: Secondary | ICD-10-CM | POA: Diagnosis not present

## 2021-02-13 LAB — BASIC METABOLIC PANEL
BUN: 14 mg/dL (ref 6–23)
CO2: 25 mEq/L (ref 19–32)
Calcium: 8.7 mg/dL (ref 8.4–10.5)
Chloride: 100 mEq/L (ref 96–112)
Creatinine, Ser: 0.73 mg/dL (ref 0.40–1.50)
GFR: 89.93 mL/min (ref >60.00)
Glucose, Bld: 204 mg/dL — ABNORMAL HIGH (ref 70–99)
Potassium: 4.2 mEq/L (ref 3.5–5.1)
Sodium: 133 mEq/L — ABNORMAL LOW (ref 135–145)

## 2021-02-13 LAB — HEMOGLOBIN A1C: Hgb A1c MFr Bld: 6 % (ref 4.6–6.5)

## 2021-02-14 ENCOUNTER — Encounter: Payer: Self-pay | Admitting: Oncology

## 2021-02-14 LAB — FRUCTOSAMINE: Fructosamine: 290 umol/L — ABNORMAL HIGH (ref 0–285)

## 2021-02-15 ENCOUNTER — Encounter: Payer: Self-pay | Admitting: Endocrinology

## 2021-02-15 ENCOUNTER — Other Ambulatory Visit: Payer: Self-pay

## 2021-02-15 ENCOUNTER — Ambulatory Visit (INDEPENDENT_AMBULATORY_CARE_PROVIDER_SITE_OTHER): Payer: Medicare Other | Admitting: Endocrinology

## 2021-02-15 VITALS — BP 148/64 | HR 84 | Ht 73.0 in | Wt 220.4 lb

## 2021-02-15 DIAGNOSIS — I10 Essential (primary) hypertension: Secondary | ICD-10-CM | POA: Diagnosis not present

## 2021-02-15 DIAGNOSIS — C7931 Secondary malignant neoplasm of brain: Secondary | ICD-10-CM | POA: Diagnosis not present

## 2021-02-15 DIAGNOSIS — G8929 Other chronic pain: Secondary | ICD-10-CM | POA: Diagnosis not present

## 2021-02-15 DIAGNOSIS — C7951 Secondary malignant neoplasm of bone: Secondary | ICD-10-CM | POA: Diagnosis not present

## 2021-02-15 DIAGNOSIS — E11649 Type 2 diabetes mellitus with hypoglycemia without coma: Secondary | ICD-10-CM | POA: Diagnosis not present

## 2021-02-15 DIAGNOSIS — E1165 Type 2 diabetes mellitus with hyperglycemia: Secondary | ICD-10-CM | POA: Diagnosis not present

## 2021-02-15 DIAGNOSIS — Z794 Long term (current) use of insulin: Secondary | ICD-10-CM | POA: Diagnosis not present

## 2021-02-15 DIAGNOSIS — C61 Malignant neoplasm of prostate: Secondary | ICD-10-CM | POA: Diagnosis not present

## 2021-02-15 NOTE — Patient Instructions (Addendum)
Take Humalog before each meal or large snack

## 2021-02-15 NOTE — Progress Notes (Signed)
Patient ID: Stephen Lynch, male   DOB: 1945-12-02, 76 y.o.   MRN: [id number]           Reason for Appointment: Endocrinology follow-up   PCP: Dr. Criss Rosales   History of Present Illness:          Date of diagnosis of type 2 diabetes mellitus: 2007?       Background history:   He has been on insulin since about the time of diagnosis.  He thinks initially oral agents that were prescribed did not work No detailed history of his previous treatment is available and A1c from last year was over 12%  Recent history:   Most recent A1c is 6% compared to 8.5  INSULIN regimen is: [place] 36 U at bedtime, Humalog 14-5-14 units before meals  Non-insulin hypoglycemic drugs the patient is taking are:   Current management, blood sugar patterns and problems identified:  He has lost significant amount of weight since his last visit because of intercurrent illnesses and treatment for his prostate cancer  Because of tendency to hypoglycemia his Humalog dose has been progressively decreased over the phone  However he still continue same dose of [place]  He was told to start or increase his Ozempic but he forgot to do so However is already losing weight  Also blood sugars appear to be significantly better compared to his last visit  His blood sugars are variable at all times and appears to be having sporadic high readings at lunch and dinnertime  Occasionally will have high readings late at night because of snacks such as last night  Also may sometimes take additional Humalog insulin postprandially when blood sugars are higher and blood sugar may be low normal  Otherwise hypoglycemia has been minimal  Currently not doing much exercise Previously had not taken [place] because of cost   Typical meal intake: Breakfast is usually cereal and almond milk or grits and eggs.  Lunch fruits, tuna or other seafood dinner is rice, vegetables and protein.                Snacks are nuts, fruits, ice cream,  chocolate  Exercise: minimal, limited by weakness  Glucose monitoring:  done 1-2 times a day         Glucometer: Libre version 2  Interpretation of the download as follows  Overall blood sugars are on an average in the upper end of the target range with moderate fluctuation  No consistent pattern is seen since blood sugar levels show sporadic episodes of HYPERGLYCEMIA at all different times including overnight  His best blood sugars are on 1/1 when they were fairly consistently within the target range at all times  Overnight blood sugars are mostly high averaging at least 150 with some variability and occasional significant high readings usually starting before bedtime  Hypoglycemia has been minimal with only 1 episode at 2 AM preceded by a high blood sugar POSTPRANDIAL readings are in general Consistently however he has sporadic spikes after lunch or dinner as well as occasionally in  Highest blood sugars overall were on 12/30  CGM use % of time 100  2-week average/GV 168  Time in range 63  % Time Above 180 27  % Time above 250 9  % Time Below 70 1     PRE-MEAL Fasting Lunch Dinner Bedtime Overall  Glucose range:       Averages: 159 155 166     POST-MEAL PC Breakfast PC Lunch PC Dinner  Glucose  range:     Averages: 182 159 197   Previously:  CGM use % of time 66  2-week average/GV 230/37  Time in range       35%  % Time Above 180 27  % Time above 250 35  % Time Below 70 1     PRE-MEAL Fasting Lunch Dinner Bedtime Overall  Glucose range:       Averages: 182    230   POST-MEAL PC Breakfast PC Lunch PC Dinner  Glucose range:     Averages: 275 251 240    Dietician visit, most recent: never  Weight history:  Wt Readings from Last 3 Encounters:  02/15/21 220 lb 6.4 oz (100 kg)  02/08/21 217 lb 11.2 oz (98.7 kg)  01/18/21 212 lb 3.2 oz (96.3 kg)    Glycemic control:   Lab Results  Component Value Date   HGBA1C 6.0 02/13/2021   HGBA1C 8.5 (H) 08/18/2020    HGBA1C 10.0 (A) 06/26/2020   Lab Results  Component Value Date   LDLCALC 72 07/18/2020   CREATININE 0.73 02/13/2021   No results found for: [facility]  Lab Results  Component Value Date   FRUCTOSAMINE 290 (H) 02/13/2021   FRUCTOSAMINE 282 09/29/2020   FRUCTOSAMINE 350 (H) 07/18/2020    Lab on 02/13/2021  Component Date Value Ref Range Status   Fructosamine 02/13/2021 290 (H)  0 - 285 umol/L Final   Comment: Published reference interval for apparently healthy subjects between age 7 and 61 is 1 - 285 umol/L and in a poorly controlled diabetic population is 228 - 563 umol/L with a mean of 396 umol/L.    Sodium 02/13/2021 133 (L)  135 - 145 mEq/L Final   Potassium 02/13/2021 4.2  3.5 - 5.1 mEq/L Final   Chloride 02/13/2021 100  96 - 112 mEq/L Final   CO2 02/13/2021 25  19 - 32 mEq/L Final   Glucose, Bld 02/13/2021 204 (H)  70 - 99 mg/dL Final   BUN 02/13/2021 14  6 - 23 mg/dL Final   Creatinine, Ser 02/13/2021 0.73  0.40 - 1.50 mg/dL Final   GFR 02/13/2021 89.93  >60.00 mL/min Final   Calculated using the CKD-EPI Creatinine Equation (2021)   Calcium 02/13/2021 8.7  8.4 - 10.5 mg/dL Final   Hgb A1c MFr Bld 02/13/2021 6.0  4.6 - 6.5 % Final   Glycemic Control Guidelines for People with Diabetes:Non Diabetic:  <6%Goal of Therapy: <7%Additional Action Suggested:  >8%     Allergies as of 02/15/2021       Reactions   Chlorhexidine         Medication List        Accurate as of February 15, 2021 11:59 PM. If you have any questions, ask your nurse or doctor.          ASPIRIN 81 PO Take 81 mg by mouth daily.   Calcium-Magnesium-Zinc 333-133-5 MG Tabs Take 1 tablet by mouth daily.   Chromium 1 MG Caps Take by mouth.   DULoxetine 60 MG capsule Commonly known as: CYMBALTA Take 60 mg by mouth daily.   FreeStyle Libre 2 Sensor Misc APPLY EVERY 14 DAYS, CHECK GLUCOSE BEFORE EACH MEAL, 2 HRS AFTER, IN THE MORNING AND BEFORE BEDTIME   HumaLOG Junior KwikPen 100  UNIT/ML Generic drug: Insulin lispro Inject 0.2 mLs (20 Units total) into the skin 3 (three) times daily after meals. What changed:  how much to take additional instructions   HumaLOG KwikPen 100  UNIT/ML KwikPen Generic drug: insulin lispro 20 Units in the morning, at noon, and at bedtime. 14units before breakfast, 5 units before lunch, and 14 units before dinner What changed: Another medication with the same name was changed. Make sure you understand how and when to take each.   lidocaine 5 % Commonly known as: LIDODERM Place 2 patches onto the skin daily. Remove & Discard patch within 12 hours or as directed by MD   lidocaine-prilocaine cream Commonly known as: EMLA Apply 1 application topically as needed.   loperamide 1 MG/5ML solution Commonly known as: IMODIUM Take 2 mg by mouth daily as needed for diarrhea or loose stools.   magnesium 30 MG tablet Take 30 mg by mouth 2 (two) times daily.   ondansetron 4 MG tablet Commonly known as: ZOFRAN Take 1 tablet (4 mg total) by mouth every 6 (six) hours as needed. for nausea   Oxycodone HCl 10 MG Tabs Take 1 tablet (10 mg total) by mouth every 4 (four) hours as needed.   prochlorperazine 10 MG tablet Commonly known as: COMPAZINE Take 1 tablet (10 mg total) by mouth every 6 (six) hours as needed.   rosuvastatin 10 MG tablet Commonly known as: CRESTOR Take 1 tablet (10 mg total) by mouth daily. What changed: when to take this   TIGER BALM ARTHRITIS RUB EX Apply 1 application topically 2 (two) times daily as needed (back pain).   [name] FlexTouch 100 UNIT/ML FlexTouch Pen Generic drug: insulin degludec Inject 36 Units into the skin at bedtime.        Allergies:  Allergies  Allergen Reactions   Chlorhexidine     Past Medical History:  Diagnosis Date   Cancer ([place])    prostate   Diabetes mellitus without complication ([place])    Dyslipidemia    Hypertension     Past Surgical History:  Procedure Laterality  Date   EYE SURGERY     IR IMAGING GUIDED PORT INSERTION  06/12/2020    Family History  Problem Relation Age of Onset   CAD Mother 31   Other Father 56       age   Other Brother 74       unknown cause    Social History:  reports that he has quit smoking. His smoking use included cigarettes. He has a 7.50 pack-year smoking history. He has never used smokeless tobacco. He reports that he does not currently use alcohol. He reports that he does not currently use drugs.   Review of Systems   Lipid history: Has been treated with Crestor by PCP    Lab Results  Component Value Date   CHOL 142 07/18/2020   HDL 53.80 07/18/2020   LDLCALC 72 07/18/2020   TRIG 80.0 07/18/2020   CHOLHDL 3 07/18/2020           Hypertension: Has been present variably, previously on enalapril, managed by PCP  BP Readings from Last 3 Encounters:  02/15/21 (!) 148/64  02/10/21 129/63  02/08/21 (!) 147/70    Most recent eye exam was in 2022  Most recent foot exam: 5/22  Currently known complications of diabetes: Erectile dysfunction  MULTINODULAR goiter:  He has 3 abnormal lesions and radiologist has recommended biopsy of 2 of the solid nodules with highest TI-RADS 4 Location: Right; Inferior   Maximum size: 2.3 cm; Other 2 dimensions: 2.1 x 2.1 cm Composition: solid/almost completely solid (2)   Echogenicity: hypoechoic (2)    Biopsy has been recommended but he  wants to wait till his prostate cancer treatment is complete  Lab Results  Component Value Date   TSH 0.76 07/18/2020     LABS:  Lab on 02/13/2021  Component Date Value Ref Range Status   Fructosamine 02/13/2021 290 (H)  0 - 285 umol/L Final   Comment: Published reference interval for apparently healthy subjects between age 60 and 13 is 15 - 285 umol/L and in a poorly controlled diabetic population is 228 - 563 umol/L with a mean of 396 umol/L.    Sodium 02/13/2021 133 (L)  135 - 145 mEq/L Final   Potassium 02/13/2021 4.2   3.5 - 5.1 mEq/L Final   Chloride 02/13/2021 100  96 - 112 mEq/L Final   CO2 02/13/2021 25  19 - 32 mEq/L Final   Glucose, Bld 02/13/2021 204 (H)  70 - 99 mg/dL Final   BUN 02/13/2021 14  6 - 23 mg/dL Final   Creatinine, Ser 02/13/2021 0.73  0.40 - 1.50 mg/dL Final   GFR 02/13/2021 89.93  >60.00 mL/min Final   Calculated using the CKD-EPI Creatinine Equation (2021)   Calcium 02/13/2021 8.7  8.4 - 10.5 mg/dL Final   Hgb A1c MFr Bld 02/13/2021 6.0  4.6 - 6.5 % Final   Glycemic Control Guidelines for People with Diabetes:Non Diabetic:  <6%Goal of Therapy: <7%Additional Action Suggested:  >8%     Physical Examination:  BP (!) 148/64    Pulse 84    Ht 6\' 1"  (1.854 m)    Wt 220 lb 6.4 oz (100 kg)    SpO2 95%    BMI 29.08 kg/m         ASSESSMENT:  Diabetes type 2 on insulin  See history of present illness for detailed discussion of current diabetes management, blood sugar patterns and problems identified  He is now on basal bolus insulin insulin only Monitoring is done with the freestyle libre  Most recent A1c is 6% compared to 8.5 %  Hemoglobin A1c may be affected by his anemia  His blood sugar patterns and day-to-day management was reviewed in detail  Blood sugars are generally better compared to last time and now 63% within target range compared to 35 Although he is requiring overall less insulin especially at mealtime because of decreased intake and weight loss he still has somewhat variable blood sugars Is having difficulty understanding how to adjust his mealtime dose based on what he is eating and also making sure he takes insulin before starting to eat Occasionally has significantly high readings at bedtime related to snacks  Fasting blood sugars are variable but not consistently high  History of multinodular goiter: We will need to discuss considering his needle aspiration biopsy    PLAN:    He will not restart Ozempic  He was explained the need to take his mealtime  insulin before starting to eat every meal and snack Again explained the action of mealtime insulin and onset of action as well as duration of effect and need to cover postprandial spikes  His wife will help him to be better compliant with the insulin doses Also may need to take 4 to 6 units for bedtime snacks that have carbohydrate He will likely need to increase his mealtime dose by 2 to 4 units when eating more carbohydrates or eating out No change in [place] as needed  With his blood pressure being periodically high will consider starting losartan on the next visit  Follow-up: 2 months  Total visit time including  counseling = 30 Minutes  Patient Instructions  Take Humalog before each meal or large snack       [name] 02/16/2021, 9:22 AM   Note: This office note was prepared with Dragon voice recognition system technology. Any transcriptional errors that result from this process are unintentional.

## 2021-02-16 DIAGNOSIS — G9341 Metabolic encephalopathy: Secondary | ICD-10-CM | POA: Diagnosis not present

## 2021-02-16 DIAGNOSIS — I10 Essential (primary) hypertension: Secondary | ICD-10-CM | POA: Diagnosis not present

## 2021-02-16 DIAGNOSIS — F0390 Unspecified dementia without behavioral disturbance: Secondary | ICD-10-CM | POA: Diagnosis not present

## 2021-02-16 DIAGNOSIS — C7951 Secondary malignant neoplasm of bone: Secondary | ICD-10-CM | POA: Diagnosis not present

## 2021-02-16 DIAGNOSIS — C61 Malignant neoplasm of prostate: Secondary | ICD-10-CM | POA: Diagnosis not present

## 2021-02-16 DIAGNOSIS — E1165 Type 2 diabetes mellitus with hyperglycemia: Secondary | ICD-10-CM | POA: Insufficient documentation

## 2021-02-16 DIAGNOSIS — E119 Type 2 diabetes mellitus without complications: Secondary | ICD-10-CM | POA: Diagnosis not present

## 2021-02-19 DIAGNOSIS — G9341 Metabolic encephalopathy: Secondary | ICD-10-CM | POA: Diagnosis not present

## 2021-02-19 DIAGNOSIS — F0390 Unspecified dementia without behavioral disturbance: Secondary | ICD-10-CM | POA: Diagnosis not present

## 2021-02-19 DIAGNOSIS — E119 Type 2 diabetes mellitus without complications: Secondary | ICD-10-CM | POA: Diagnosis not present

## 2021-02-19 DIAGNOSIS — C61 Malignant neoplasm of prostate: Secondary | ICD-10-CM | POA: Diagnosis not present

## 2021-02-19 DIAGNOSIS — I10 Essential (primary) hypertension: Secondary | ICD-10-CM | POA: Diagnosis not present

## 2021-02-19 DIAGNOSIS — C7951 Secondary malignant neoplasm of bone: Secondary | ICD-10-CM | POA: Diagnosis not present

## 2021-02-20 ENCOUNTER — Other Ambulatory Visit: Payer: Self-pay | Admitting: *Deleted

## 2021-02-20 NOTE — Patient Outreach (Signed)
[facility]) Care Management  [place]  02/20/2021   Stephen Lynch 12/31/1945 [id number]   Outgoing call place to significant other, state she does not have time to talk.  Call then placed to member, successful.  Denies any urgent concerns, encouraged to contact this care manager with questions.    Encounter Medications:  Outpatient Encounter Medications as of 02/20/2021  Medication Sig   ASPIRIN 81 PO Take 81 mg by mouth daily.    Calcium-Magnesium-Zinc 333-133-5 MG TABS Take 1 tablet by mouth daily.   Chromium 1 MG CAPS Take by mouth.   Continuous Blood Gluc Sensor (FREESTYLE LIBRE 2 SENSOR) MISC APPLY EVERY 14 DAYS, CHECK GLUCOSE BEFORE EACH MEAL, 2 HRS AFTER, IN THE MORNING AND BEFORE BEDTIME   DULoxetine (CYMBALTA) 60 MG capsule Take 60 mg by mouth daily. (Patient not taking: Reported on 12/22/2020)   HUMALOG KWIKPEN 100 UNIT/ML KwikPen 20 Units in the morning, at noon, and at bedtime. 14units before breakfast, 5 units before lunch, and 14 units before dinner   insulin lispro (HUMALOG) 100 UNIT/ML KwikPen Junior Inject 0.2 mLs (20 Units total) into the skin 3 (three) times daily after meals. (Patient taking differently: Inject 7-25 Units into the skin 3 (three) times daily after meals. Per sliding scale)   lidocaine (LIDODERM) 5 % Place 2 patches onto the skin daily. Remove & Discard patch within 12 hours or as directed by MD   lidocaine-prilocaine (EMLA) cream Apply 1 application topically as needed. (Patient not taking: Reported on 02/15/2021)   loperamide (IMODIUM) 1 MG/5ML solution Take 2 mg by mouth daily as needed for diarrhea or loose stools.   magnesium 30 MG tablet Take 30 mg by mouth 2 (two) times daily. (Patient not taking: Reported on 02/15/2021)   Menthol-Camphor (TIGER BALM ARTHRITIS RUB EX) Apply 1 application topically 2 (two) times daily as needed (back pain).   ondansetron (ZOFRAN) 4 MG tablet Take 1 tablet (4 mg total) by mouth every 6 (six)  hours as needed. for nausea   Oxycodone HCl 10 MG TABS Take 1 tablet (10 mg total) by mouth every 4 (four) hours as needed.   prochlorperazine (COMPAZINE) 10 MG tablet Take 1 tablet (10 mg total) by mouth every 6 (six) hours as needed. (Patient not taking: Reported on 02/15/2021)   rosuvastatin (CRESTOR) 10 MG tablet Take 1 tablet (10 mg total) by mouth daily. (Patient taking differently: Take 10 mg by mouth every evening.)   TRESIBA FLEXTOUCH 100 UNIT/ML FlexTouch Pen Inject 36 Units into the skin at bedtime.   No facility-administered encounter medications on file as of 02/20/2021.    Functional Status:  In your present state of health, do you have any difficulty performing the following activities: 12/07/2020  Hearing? N  Vision? N  Difficulty concentrating or making decisions? Y  Walking or climbing stairs? Y  Dressing or bathing? Y  Doing errands, shopping? Y  Some recent data might be hidden    Fall/Depression Screening: Fall Risk  07/24/2020 03/28/2020 04/01/2019  Falls in the past year? 0 0 0  Number falls in past yr: - 0 0  Injury with Fall? - 0 0  Risk for fall due to : - - No Fall Risks  Follow up - - Falls prevention discussed   PHQ 2/9 Scores 07/24/2020 04/01/2019  PHQ - 2 Score 0 0    Assessment:   Care Plan Care Plan : RNCM Plan of Care  Updates made by [name], RN since  02/20/2021 12:00 AM     Problem: Know[place]ge deficit regarding management of health conditions, prostate cancer and DM   Priority: High     Long-Range Goal: Member will show adequate management of chronic conditions, Prostate Cancer and DM (goal altered to include cancer)   Start Date: 10/27/2020  Expected End Date: 04/26/2021  This Visit's Progress: On track  Recent Progress: On track  Priority: High  Note:   Current Barriers:  Knowledge Deficits related to plan of care for management of DMII and Prostate cancer  Chronic Disease Management support and education needs related to DMII and  Prostate cancer Transportation barriers Non-adherence to prescribed medication regimen  RNCM Clinical Goal(s):  Patient will verbalize understanding of plan for management of DMII and Prostate cancer as evidenced by no hospital admissions take all medications exactly as prescribed and will call provider for medication related questions as evidenced by reported adherence by member and wife    demonstrate understanding of rationale for each prescribed medication as evidenced by Insulin [name])    attend all scheduled medical appointments: PCP, oncology, and endocrinology as evidenced by record of attendance at provider offices        continue to work with RN Care Manager and/or Social Worker to address care management and care coordination needs related to DMII and prostate cancer as evidenced by adherence to CM Team Scheduled appointments     demonstrate ongoing self health care management ability to manage DM, including diet, medication management, and activity as evidenced by     through collaboration with RN Care manager, provider, and care team.   Interventions: Inter-disciplinary care team collaboration (see longitudinal plan of care) Evaluation of current treatment plan related to  self management and patient's adherence to plan as established by provider   Diabetes:  (Status: Goal on track: YES.) Lab Results  Component Value Date   HGBA1C 8.5 (H) 08/18/2020  Assessed patient's understanding of A1c goal: <7% Provided education to patient about basic DM disease process; Reviewed medications with patient and discussed importance of medication adherence;        Reviewed prescribed diet with patient low sugars, low carbs; Reviewed scheduled/upcoming provider appointments including: Endocrinology on 10/5;         Advised patient, providing education and rationale, to check cbg 2-3 times a day and record        Assessed social determinant of health barriers;         Oncology:  (Status:  Goal on track:  Yes.) Assessment of understanding of oncology diagnosis:  Assessed patient understanding of cancer diagnosis and recommended treatment plan, Reviewed upcoming provider appointments and treatment appointments, and Assessed support system. Has consistent/reliable family or other support: Yes   Update 11/11 - Significant other report member has been "out of it" as his cancer is progressing and requiring more pain medication for relief.  He has been seen by [facility], now active with the home based palliative care team.  Report he has not been able to stay mobile due to the pain, decreased appetite, drinking boost for nutritional support.  Blood sugars have been elevated as he has not been taking medication appropriately due to not being alert enough.  She has been calls to endocrinology for assistance, changes in insulin noted.  She is not happy with their experience at current PCP, looking to change providers however state it is now difficult to get member out of the house on some days.  Discussed having provider visit the home, advised  of the [facility] program, she agrees to referral.    Update 11/21 - Spoke to significant other, confirms that [facility] made home visit on Friday 11/18.  Call placed to [facility] Cecille Rubin), confirmed that they will now be acting as member's primary provider.  They will make another home visit tomorrow to monitor member's status as member has recently had round of chemo.  She continues to have a hard time with managing member's blood sugars, follow labs and appointment with endocrinology scheduled for the next couple weeks.    Patient Goals/Self-Care Activities: Patient will self administer medications as prescribed Patient will attend all scheduled provider appointments Patient will call pharmacy for medication refills Patient will call provider office for new concerns or questions    Update 12/13 - Significant other state member is doing  better than he has been the last couple weeks.  He has restarted chemo, PSA has decreased from 382 to 167.  Follow up with oncology on 12/29.  His appetite has increased however they were still having intermittent trouble with diabetes management.  Significant other called endocrinology office to report hypoglycemic episodes, lunch time insulin was decreased.  Since, readings have ranged from 160's-240.  Follow up with endocrinology on 1/5.   Update 1/10 - Member report he is feeling "much better."  Acknowledges that he was not doing well a few months ago, has progressed with the help of chemo and PT.  Expresses gratitude for this care manager assisting significant other in securing new primary provider.  PSA levels have increased slightly but remain lower than previously (currently 218).  Next oncology appointment scheduled for 1/19.  Was seen by endocrinology on 1/5, most recent A1C was 6, will continue current treatment.  Blood sugar today was 120, will follow up with endocrinology on 4/5.          Plan:  Follow-up: Patient agrees to Care Plan and Follow-up. Follow-up in 1 month(s)  [name], RN, MSN, [place] [phone]

## 2021-02-26 ENCOUNTER — Telehealth: Payer: Self-pay | Admitting: Endocrinology

## 2021-02-26 DIAGNOSIS — Z794 Long term (current) use of insulin: Secondary | ICD-10-CM

## 2021-02-26 DIAGNOSIS — E1165 Type 2 diabetes mellitus with hyperglycemia: Secondary | ICD-10-CM

## 2021-02-26 MED ORDER — FREESTYLE LIBRE 2 SENSOR MISC: 2 refills | Status: AC

## 2021-02-26 NOTE — Telephone Encounter (Signed)
Rx sent 

## 2021-02-26 NOTE — Telephone Encounter (Signed)
MEDICATION: Continuous Blood Gluc Sensor (FREESTYLE LIBRE 2 SENSOR) MISC  PHARMACY:   [id number] [place], [place] - [address] Phone:  [phone]  Fax:  [phone]      HAS THE PATIENT CONTACTED THEIR PHARMACY?  no  IS THIS A 90 DAY SUPPLY : 30 DAY  IS PATIENT OUT OF MEDICATION: YES  IF NOT; HOW MUCH IS LEFT:   LAST APPOINTMENT DATE: @1 /06/2021  NEXT APPOINTMENT DATE:@4 /04/2021  DO WE HAVE YOUR PERMISSION TO LEAVE A DETAILED MESSAGE?:  OTHER COMMENTS: Patient request to receive 2 Libre sensor per prescription. Please call patient at [phone] when sent.   **Let patient know to contact pharmacy at the end of the day to make sure medication is ready. **  ** Please notify patient to allow 48-72 hours to process**  **Encourage patient to contact the pharmacy for refills or they can request refills through [facility]**

## 2021-02-27 ENCOUNTER — Telehealth: Payer: Self-pay | Admitting: *Deleted

## 2021-02-27 NOTE — Telephone Encounter (Signed)
Faxed orders for Home Health signed by Dr. Alen Blew Fax confirmation received  Copy of orders sent to HIM to be scanned to patient chart

## 2021-02-28 ENCOUNTER — Telehealth: Payer: Self-pay | Admitting: *Deleted

## 2021-02-28 MED FILL — Dexamethasone Sodium Phosphate Inj 100 MG/10ML: INTRAMUSCULAR | Qty: 1 | Status: AC

## 2021-02-28 NOTE — Telephone Encounter (Signed)
Patient's [significant other] [name] called to inform Dr. Alen Blew of concerns about patient in advance of appt with Dr. Alen Blew on 03/01/21: Per [name] - pain has increased significantly in last 5-6 days. Now having pain over entire body. Minimal appetite and very weak. Message routed to Dr. Alen Blew.

## 2021-02-28 NOTE — Telephone Encounter (Signed)
Contacted by Lynelle Smoke, Physical Therapist with [facility] to report patient declined PT on 02/27/21 due to fatigue and generalized body pain.  Message routed to Dr. Alen Blew as Juluis Rainier

## 2021-03-01 ENCOUNTER — Inpatient Hospital Stay: Payer: Medicare Other

## 2021-03-01 ENCOUNTER — Inpatient Hospital Stay: Payer: Medicare Other | Attending: Oncology

## 2021-03-01 ENCOUNTER — Other Ambulatory Visit: Payer: Self-pay

## 2021-03-01 ENCOUNTER — Inpatient Hospital Stay (HOSPITAL_BASED_OUTPATIENT_CLINIC_OR_DEPARTMENT_OTHER): Payer: Medicare Other | Admitting: Oncology

## 2021-03-01 VITALS — BP 154/62 | HR 89 | Temp 97.7°F | Resp 18 | Ht 73.0 in | Wt 213.3 lb

## 2021-03-01 DIAGNOSIS — G893 Neoplasm related pain (acute) (chronic): Secondary | ICD-10-CM | POA: Insufficient documentation

## 2021-03-01 DIAGNOSIS — C61 Malignant neoplasm of prostate: Secondary | ICD-10-CM

## 2021-03-01 DIAGNOSIS — Z5111 Encounter for antineoplastic chemotherapy: Secondary | ICD-10-CM | POA: Diagnosis not present

## 2021-03-01 DIAGNOSIS — D649 Anemia, unspecified: Secondary | ICD-10-CM | POA: Diagnosis not present

## 2021-03-01 DIAGNOSIS — C7951 Secondary malignant neoplasm of bone: Secondary | ICD-10-CM | POA: Insufficient documentation

## 2021-03-01 DIAGNOSIS — Z79899 Other long term (current) drug therapy: Secondary | ICD-10-CM | POA: Insufficient documentation

## 2021-03-01 DIAGNOSIS — Z191 Hormone sensitive malignancy status: Secondary | ICD-10-CM | POA: Insufficient documentation

## 2021-03-01 DIAGNOSIS — Z5189 Encounter for other specified aftercare: Secondary | ICD-10-CM | POA: Insufficient documentation

## 2021-03-01 DIAGNOSIS — Z95828 Presence of other vascular implants and grafts: Secondary | ICD-10-CM

## 2021-03-01 LAB — CBC WITH DIFFERENTIAL (CANCER CENTER ONLY)
Abs Immature Granulocytes: 0.03 10*3/uL (ref 0.00–0.07)
Basophils Absolute: 0 10*3/uL (ref 0.0–0.1)
Basophils Relative: 0 %
Eosinophils Absolute: 0 10*3/uL (ref 0.0–0.5)
Eosinophils Relative: 0 %
HCT: 26.2 % — ABNORMAL LOW (ref 39.0–52.0)
Hemoglobin: 8.7 g/dL — ABNORMAL LOW (ref 13.0–17.0)
Immature Granulocytes: 1 %
Lymphocytes Relative: 13 %
Lymphs Abs: 0.8 10*3/uL (ref 0.7–4.0)
MCH: 32.2 pg (ref 26.0–34.0)
MCHC: 33.2 g/dL (ref 30.0–36.0)
MCV: 97 fL (ref 80.0–100.0)
Monocytes Absolute: 0.6 10*3/uL (ref 0.1–1.0)
Monocytes Relative: 10 %
Neutro Abs: 4.8 10*3/uL (ref 1.7–7.7)
Neutrophils Relative %: 76 %
Platelet Count: 154 10*3/uL (ref 150–400)
RBC: 2.7 MIL/uL — ABNORMAL LOW (ref 4.22–5.81)
RDW: 18.6 % — ABNORMAL HIGH (ref 11.5–15.5)
WBC Count: 6.3 10*3/uL (ref 4.0–10.5)
nRBC: 0 % (ref 0.0–0.2)

## 2021-03-01 LAB — SAMPLE TO BLOOD BANK

## 2021-03-01 LAB — CMP (CANCER CENTER ONLY)
ALT: 33 U/L (ref 0–44)
AST: 66 U/L — ABNORMAL HIGH (ref 15–41)
Albumin: 3.5 g/dL (ref 3.5–5.0)
Alkaline Phosphatase: 458 U/L — ABNORMAL HIGH (ref 38–126)
Anion gap: 10 (ref 5–15)
BUN: 23 mg/dL (ref 8–23)
CO2: 24 mmol/L (ref 22–32)
Calcium: 8.9 mg/dL (ref 8.9–10.3)
Chloride: 99 mmol/L (ref 98–111)
Creatinine: 0.85 mg/dL (ref 0.61–1.24)
GFR, Estimated: >60 mL/min (ref >60)
Glucose, Bld: 213 mg/dL — ABNORMAL HIGH (ref 70–99)
Potassium: 4.5 mmol/L (ref 3.5–5.1)
Sodium: 133 mmol/L — ABNORMAL LOW (ref 135–145)
Total Bilirubin: 0.7 mg/dL (ref 0.3–1.2)
Total Protein: 7.3 g/dL (ref 6.5–8.1)

## 2021-03-01 MED ORDER — SODIUM CHLORIDE 0.9% FLUSH
10.0000 mL | INTRAVENOUS | Status: DC | PRN
  Administered 2021-03-01: 10 mL via INTRAVENOUS

## 2021-03-01 MED ORDER — SODIUM CHLORIDE 0.9 % IV SOLN
10.0000 mg | Freq: Once | INTRAVENOUS | Status: AC
  Administered 2021-03-01: 10 mg via INTRAVENOUS
  Filled 2021-03-01: qty 10

## 2021-03-01 MED ORDER — HEPARIN SOD (PORK) LOCK FLUSH 100 UNIT/ML IV SOLN
500.0000 [IU] | Freq: Once | INTRAVENOUS | Status: AC | PRN
  Administered 2021-03-01: 500 [IU]

## 2021-03-01 MED ORDER — DIPHENHYDRAMINE HCL 50 MG/ML IJ SOLN
25.0000 mg | Freq: Once | INTRAMUSCULAR | Status: AC
  Administered 2021-03-01: 25 mg via INTRAVENOUS
  Filled 2021-03-01: qty 1

## 2021-03-01 MED ORDER — PROCHLORPERAZINE MALEATE 10 MG PO TABS: 10.0000 mg | ORAL_TABLET | Freq: Four times a day (QID) | ORAL | 3 refills | Status: DC | PRN

## 2021-03-01 MED ORDER — FAMOTIDINE 20 MG IN NS 100 ML IVPB
20.0000 mg | Freq: Once | INTRAVENOUS | Status: AC
  Administered 2021-03-01: 20 mg via INTRAVENOUS
  Filled 2021-03-01: qty 100

## 2021-03-01 MED ORDER — SODIUM CHLORIDE 0.9% FLUSH
10.0000 mL | INTRAVENOUS | Status: DC | PRN
  Administered 2021-03-01: 10 mL

## 2021-03-01 MED ORDER — OXYCODONE HCL 10 MG PO TABS: 10.0000 mg | ORAL_TABLET | ORAL | 0 refills | Status: DC | PRN

## 2021-03-01 MED ORDER — SODIUM CHLORIDE 0.9 % IV SOLN
20.0000 mg/m2 | Freq: Once | INTRAVENOUS | Status: AC
  Administered 2021-03-01: 44 mg via INTRAVENOUS
  Filled 2021-03-01: qty 4.4

## 2021-03-01 MED ORDER — SODIUM CHLORIDE 0.9 % IV SOLN: Freq: Once | INTRAVENOUS | Status: AC

## 2021-03-01 NOTE — Patient Instructions (Signed)
[facility]  Discharge Instructions: Thank you for choosing [facility] to provide your oncology and hematology care.   If you have a lab appointment with the [facility], please go directly to the [facility] and check in at the registration area.   Wear comfortable clothing and clothing appropriate for easy access to any Portacath or PICC line.   We strive to give you quality time with your provider. You may need to reschedule your appointment if you arrive late (15 or more minutes).  Arriving late affects you and other patients whose appointments are after yours.  Also, if you miss three or more appointments without notifying the office, you may be dismissed from the clinic at the providers discretion.      For prescription refill requests, have your pharmacy contact our office and allow 72 hours for refills to be completed.    Today you received the following chemotherapy and/or immunotherapy agents Jevtana      To help prevent nausea and vomiting after your treatment, we encourage you to take your nausea medication as directed.  BELOW ARE SYMPTOMS THAT SHOULD BE REPORTED IMMEDIATELY: *FEVER GREATER THAN 100.4 F (38 C) OR HIGHER *CHILLS OR SWEATING *NAUSEA AND VOMITING THAT IS NOT CONTROLLED WITH YOUR NAUSEA MEDICATION *UNUSUAL SHORTNESS OF BREATH *UNUSUAL BRUISING OR BLEEDING *URINARY PROBLEMS (pain or burning when urinating, or frequent urination) *BOWEL PROBLEMS (unusual diarrhea, constipation, pain near the anus) TENDERNESS IN MOUTH AND THROAT WITH OR WITHOUT PRESENCE OF ULCERS (sore throat, sores in mouth, or a toothache) UNUSUAL RASH, SWELLING OR PAIN  UNUSUAL VAGINAL DISCHARGE OR ITCHING   Items with * indicate a potential emergency and should be followed up as soon as possible or go to the Emergency Department if any problems should occur.  Please show the CHEMOTHERAPY ALERT CARD or IMMUNOTHERAPY ALERT CARD at check-in to the  Emergency Department and triage nurse.  Should you have questions after your visit or need to cancel or reschedule your appointment, please contact [facility]  Dept: [phone]  and follow the prompts.  Office hours are 8:00 a.m. to 4:30 p.m. Monday - Friday. Please note that voicemails left after 4:00 p.m. may not be returned until the following business day.  We are closed weekends and major holidays. You have access to a nurse at all times for urgent questions. Please call the main number to the clinic Dept: [phone] and follow the prompts.   For any non-urgent questions, you may also contact your provider using MyChart. We now offer e-Visits for anyone 75 and older to request care online for non-urgent symptoms. For details visit [email or web address].   Also download the MyChart app! Go to the app store, search "MyChart", open the app, select [facility], and log in with your MyChart username and password.  Due to Covid, a mask is required upon entering the hospital/clinic. If you do not have a mask, one will be given to you upon arrival. For doctor visits, patients may have 1 support person aged 75 or older with them. For treatment visits, patients cannot have anyone with them due to current Covid guidelines and our immunocompromised population.

## 2021-03-01 NOTE — Progress Notes (Signed)
Hematology and Oncology Follow Up Visit  Stephen Lynch [id number] July 28, 1945 76 y.o. 03/01/2021 8:01 AM [facility], Christene Lye, MD   Principle Diagnosis: 76 year old man with advanced prostate cancer disease to the bone diagnosed in February 2021.  He has castration-resistant a disease.  Prior Therapy:  He status post CT-guided biopsy of the right iliac bone.  [name] started on April 20, 2019.  Xtandi 160 mg daily started in April 2021.  Therapy discontinued in March 2022 due to progression of disease.  Zytiga 1000 mg daily with prednisone 5 mg daily started in March 2022.  Taxotere chemotherapy 75 mg per metered square started on June 08, 2020.  He is here for cycle 9 of therapy.  Current therapy:    Eligard 30 mg started in April 2021.  Next Eligard will be given in April 2023.  Jevtana chemotherapy 20 mg per metered square every 3 weeks started on December 28, 2020.  He is here for cycle 4 of therapy.   Interim History: [name] presents today for repeat evaluation.  Since the last visit, he reports few complaints at this time.  He reports increased shoulder and lower back pain despite being on oxycodone.  He denies any nausea, vomiting or abdominal pain but has reported decline in his appetite.  His performance status remains adequate and continues to ambulate without any major difficulties.  He has lost weight since the last visit.  He denies any worsening neuropathy or excessive fatigue.   Medications: Reviewed without changes. Current Outpatient Medications  Medication Sig Dispense Refill   ASPIRIN 81 PO Take 81 mg by mouth daily.      Calcium-Magnesium-Zinc 333-133-5 MG TABS Take 1 tablet by mouth daily.     Chromium 1 MG CAPS Take by mouth.     Continuous Blood Gluc Sensor (FREESTYLE LIBRE 2 SENSOR) MISC APPLY EVERY 14 DAYS, CHECK GLUCOSE BEFORE EACH MEAL, 2 HRS AFTER, IN THE MORNING AND BEFORE BEDTIME 2 each 2   DULoxetine (CYMBALTA) 60 MG capsule  Take 60 mg by mouth daily. (Patient not taking: Reported on 12/22/2020)     HUMALOG KWIKPEN 100 UNIT/ML KwikPen 20 Units in the morning, at noon, and at bedtime. 14units before breakfast, 5 units before lunch, and 14 units before dinner     insulin lispro (HUMALOG) 100 UNIT/ML KwikPen Junior Inject 0.2 mLs (20 Units total) into the skin 3 (three) times daily after meals. (Patient taking differently: Inject 7-25 Units into the skin 3 (three) times daily after meals. Per sliding scale) 3 mL 6   lidocaine (LIDODERM) 5 % Place 2 patches onto the skin daily. Remove & Discard patch within 12 hours or as directed by MD 30 patch 0   lidocaine-prilocaine (EMLA) cream Apply 1 application topically as needed. (Patient not taking: Reported on 02/15/2021) 30 g 0   loperamide (IMODIUM) 1 MG/5ML solution Take 2 mg by mouth daily as needed for diarrhea or loose stools.     magnesium 30 MG tablet Take 30 mg by mouth 2 (two) times daily. (Patient not taking: Reported on 02/15/2021)     Menthol-Camphor (TIGER BALM ARTHRITIS RUB EX) Apply 1 application topically 2 (two) times daily as needed (back pain).     ondansetron (ZOFRAN) 4 MG tablet Take 1 tablet (4 mg total) by mouth every 6 (six) hours as needed. for nausea 20 tablet 1   Oxycodone HCl 10 MG TABS Take 1 tablet (10 mg total) by mouth every 4 (four) hours as needed. [place]  tablet 0   prochlorperazine (COMPAZINE) 10 MG tablet Take 1 tablet (10 mg total) by mouth every 6 (six) hours as needed. (Patient not taking: Reported on 02/15/2021) 30 tablet 3   rosuvastatin (CRESTOR) 10 MG tablet Take 1 tablet (10 mg total) by mouth daily. (Patient taking differently: Take 10 mg by mouth every evening.) 90 tablet 3   TRESIBA FLEXTOUCH 100 UNIT/ML FlexTouch Pen Inject 36 Units into the skin at bedtime. 15 mL 3   No current facility-administered medications for this visit.     Allergies:  Allergies  Allergen Reactions   Chlorhexidine       Physical Exam:  Blood pressure (!)  154/62, pulse 89, temperature 97.7 F (36.5 C), temperature source Axillary, resp. rate 18, height 6\' 1"  (1.854 m), weight 213 lb 4.8 oz (96.8 kg), SpO2 100 %.   ECOG: 2     General appearance: Comfortable appearing without any discomfort Head: Normocephalic without any trauma Oropharynx: Mucous membranes are moist and pink without any thrush or ulcers. Eyes: Pupils are equal and round reactive to light. Lymph nodes: No cervical, supraclavicular, inguinal or axillary lymphadenopathy.   Heart:regular rate and rhythm.  S1 and S2 without leg edema. Lung: Clear without any rhonchi or wheezes.  No dullness to percussion. Abdomin: Soft, nontender, nondistended with good bowel sounds.  No hepatosplenomegaly. Musculoskeletal: No joint deformity or effusion.  Full range of motion noted. Neurological: No deficits noted on motor, sensory and deep tendon reflex exam. Skin: No petechial rash or dryness.  Appeared moist.                   Lab Results: Lab Results  Component Value Date   WBC 6.3 02/08/2021   HGB 8.6 (L) 02/08/2021   HCT 27.9 (L) 02/08/2021   MCV 100.4 (H) 02/08/2021   PLT 146 (L) 02/08/2021     Chemistry      Component Value Date/Time   NA 133 (L) 02/13/2021 1153   K 4.2 02/13/2021 1153   CL 100 02/13/2021 1153   CO2 25 02/13/2021 1153   BUN 14 02/13/2021 1153   CREATININE 0.73 02/13/2021 1153   CREATININE 0.82 02/08/2021 1138      Component Value Date/Time   CALCIUM 8.7 02/13/2021 1153   ALKPHOS 440 (H) 02/08/2021 1138   AST 58 (H) 02/08/2021 1138   ALT 46 (H) 02/08/2021 1138   BILITOT 0.7 02/08/2021 1138          Latest Reference Range & Units 02/08/21 11:38  Prostate Specific Ag, Serum 0.0 - 4.0 ng/mL 218.0 (H)  (H): Data is abnormally high     Impression and Plan:   76 year old man with:  1.  Advanced prostate cancer with disease to the bone diagnosed in February 2022.  He has castration-resistant disease.  He continues to  receive Jevtana chemotherapy with initial response clinical and PSA criteria.  Risks and benefits of continuing this treatment versus alternative treatment options were reviewed.  After discussion today, we opted to continue with chemotherapy given his reasonable clinical response.  Different salvage therapy options including addition 117 among other options were reiterated.  2.  Androgen deprivation: This is to be continued indefinitely.  Next Eligard will be given in April 3295 with complications reiterated today.   3.  Growth factor support: He is at risk of neutropenia and possible sepsis.  I recommended continuing growth factor support after each cycle of therapy.   4.  Prognosis and goals of care: Therapy remains  palliative although aggressive measures are warranted given his reasonable performance status.   5.  Anemia: He has not required any transfusion recently but could in the future.  His anemia is related to malignancy and chemotherapy.  Hemoglobin is adequate today and does not require any transfusion.   6.  Bone pain: Continues to be an issue related to prostate cancer progression.  His pain is predominantly in the shoulder and lower back.  I will refer him to radiation oncology for possible palliative radiation treatment.   7.  IV access: Port-A-Cath continues to be in use without any issues.  8. Follow-up: In 3 weeks for the next cycle of therapy.    30  minutes were dedicated to this encounter.  The time was spent on reviewing laboratory data, disease status update and outlining future plan of care discussion.  Zola Button, MD 1/19/20238:01 AM

## 2021-03-02 LAB — PROSTATE-SPECIFIC AG, SERUM (LABCORP): Prostate Specific Ag, Serum: 463 ng/mL — ABNORMAL HIGH (ref 0.0–4.0)

## 2021-03-03 ENCOUNTER — Other Ambulatory Visit: Payer: Self-pay

## 2021-03-03 ENCOUNTER — Inpatient Hospital Stay: Payer: Medicare Other

## 2021-03-03 VITALS — BP 151/70 | HR 93 | Temp 97.5°F | Resp 18

## 2021-03-03 DIAGNOSIS — C7951 Secondary malignant neoplasm of bone: Secondary | ICD-10-CM | POA: Diagnosis not present

## 2021-03-03 DIAGNOSIS — Z191 Hormone sensitive malignancy status: Secondary | ICD-10-CM | POA: Diagnosis not present

## 2021-03-03 DIAGNOSIS — Z5189 Encounter for other specified aftercare: Secondary | ICD-10-CM | POA: Diagnosis not present

## 2021-03-03 DIAGNOSIS — Z5111 Encounter for antineoplastic chemotherapy: Secondary | ICD-10-CM | POA: Diagnosis not present

## 2021-03-03 DIAGNOSIS — C61 Malignant neoplasm of prostate: Secondary | ICD-10-CM | POA: Diagnosis not present

## 2021-03-03 DIAGNOSIS — Z79899 Other long term (current) drug therapy: Secondary | ICD-10-CM | POA: Diagnosis not present

## 2021-03-03 MED ORDER — PEGFILGRASTIM-CBQV 6 MG/0.6ML ~~LOC~~ SOSY
6.0000 mg | PREFILLED_SYRINGE | Freq: Once | SUBCUTANEOUS | Status: AC
  Administered 2021-03-03: 6 mg via SUBCUTANEOUS

## 2021-03-05 ENCOUNTER — Telehealth: Payer: Self-pay | Admitting: Oncology

## 2021-03-05 NOTE — Telephone Encounter (Signed)
Scheduled per los, patient has been called and notified of upcoming appointments. 

## 2021-03-11 ENCOUNTER — Other Ambulatory Visit: Payer: Self-pay | Admitting: Endocrinology

## 2021-03-11 DIAGNOSIS — E119 Type 2 diabetes mellitus without complications: Secondary | ICD-10-CM | POA: Diagnosis not present

## 2021-03-11 DIAGNOSIS — I10 Essential (primary) hypertension: Secondary | ICD-10-CM | POA: Diagnosis not present

## 2021-03-11 DIAGNOSIS — E7849 Other hyperlipidemia: Secondary | ICD-10-CM | POA: Diagnosis not present

## 2021-03-11 DIAGNOSIS — M130 Polyarthritis, unspecified: Secondary | ICD-10-CM | POA: Diagnosis not present

## 2021-03-12 ENCOUNTER — Other Ambulatory Visit (HOSPITAL_COMMUNITY): Payer: Medicare Other

## 2021-03-12 DIAGNOSIS — Z9181 History of falling: Secondary | ICD-10-CM | POA: Diagnosis not present

## 2021-03-12 DIAGNOSIS — C61 Malignant neoplasm of prostate: Secondary | ICD-10-CM | POA: Diagnosis not present

## 2021-03-12 DIAGNOSIS — D630 Anemia in neoplastic disease: Secondary | ICD-10-CM | POA: Diagnosis not present

## 2021-03-12 DIAGNOSIS — C7951 Secondary malignant neoplasm of bone: Secondary | ICD-10-CM | POA: Diagnosis not present

## 2021-03-12 DIAGNOSIS — F0390 Unspecified dementia without behavioral disturbance: Secondary | ICD-10-CM | POA: Diagnosis not present

## 2021-03-12 DIAGNOSIS — I10 Essential (primary) hypertension: Secondary | ICD-10-CM | POA: Diagnosis not present

## 2021-03-12 DIAGNOSIS — Z794 Long term (current) use of insulin: Secondary | ICD-10-CM | POA: Diagnosis not present

## 2021-03-12 DIAGNOSIS — Z7982 Long term (current) use of aspirin: Secondary | ICD-10-CM | POA: Diagnosis not present

## 2021-03-12 DIAGNOSIS — E119 Type 2 diabetes mellitus without complications: Secondary | ICD-10-CM | POA: Diagnosis not present

## 2021-03-12 DIAGNOSIS — E785 Hyperlipidemia, unspecified: Secondary | ICD-10-CM | POA: Diagnosis not present

## 2021-03-12 DIAGNOSIS — G9341 Metabolic encephalopathy: Secondary | ICD-10-CM | POA: Diagnosis not present

## 2021-03-12 DIAGNOSIS — Z87440 Personal history of urinary (tract) infections: Secondary | ICD-10-CM | POA: Diagnosis not present

## 2021-03-14 ENCOUNTER — Other Ambulatory Visit: Payer: Self-pay | Admitting: Endocrinology

## 2021-03-14 DIAGNOSIS — E1165 Type 2 diabetes mellitus with hyperglycemia: Secondary | ICD-10-CM

## 2021-03-14 DIAGNOSIS — Z794 Long term (current) use of insulin: Secondary | ICD-10-CM

## 2021-03-16 DIAGNOSIS — C7951 Secondary malignant neoplasm of bone: Secondary | ICD-10-CM | POA: Diagnosis not present

## 2021-03-16 DIAGNOSIS — G9341 Metabolic encephalopathy: Secondary | ICD-10-CM | POA: Diagnosis not present

## 2021-03-16 DIAGNOSIS — I10 Essential (primary) hypertension: Secondary | ICD-10-CM | POA: Diagnosis not present

## 2021-03-16 DIAGNOSIS — E119 Type 2 diabetes mellitus without complications: Secondary | ICD-10-CM | POA: Diagnosis not present

## 2021-03-16 DIAGNOSIS — C61 Malignant neoplasm of prostate: Secondary | ICD-10-CM | POA: Diagnosis not present

## 2021-03-16 DIAGNOSIS — F0390 Unspecified dementia without behavioral disturbance: Secondary | ICD-10-CM | POA: Diagnosis not present

## 2021-03-20 ENCOUNTER — Other Ambulatory Visit: Payer: Self-pay | Admitting: *Deleted

## 2021-03-20 NOTE — Patient Outreach (Signed)
[facility]) Care Management Telephonic RN Care Manager Note   03/20/2021 Name:  Stephen Lynch MRN:  [id number] DOB:  1945-12-11  Summary: Stephen Lynch call placed to member and significant other, successful.  Denies any urgent concerns, encouraged to contact this care manager with questions.   Recommendations/Changes made from today's visit: Take pain medications as instructed, notify provider if pain not relieved.  Monitor blood sugars prior to meals and take insulin according to orders.   Subjective: Stephen Lynch is an 76 y.o. year old male who is a primary patient of [facility]. The care management team was consulted for assistance with care management and/or care coordination needs.    Telephonic RN Care Manager completed Telephone Visit today.  Objective:   Medications Reviewed Today     Reviewed by Cinda Quest, [place] (Certified Medical Assistant) on 02/15/21 at 66  Med List Status: <None>   Medication Order Taking? Sig Documenting Provider Last Dose Status Informant  ASPIRIN 81 PO [id number] Yes Take 81 mg by mouth daily.  [provider] Taking Active Spouse/Significant Other  Calcium-Magnesium-Zinc [phone] MG TABS [id number] Yes Take 1 tablet by mouth daily. [provider] Taking Active Spouse/Significant Other  Chromium 1 MG CAPS [id number] Yes Take by mouth. [provider] Taking Active Spouse/Significant Other  Continuous Blood Gluc Sensor (FREESTYLE LIBRE 2 SENSOR) MISC [id number] Yes APPLY EVERY 14 DAYS, CHECK GLUCOSE BEFORE EACH MEAL, 2 HRS AFTER, IN THE MORNING AND BEFORE BEDTIME [provider] Taking Active Spouse/Significant Other  DULoxetine (CYMBALTA) 60 MG capsule [id number] No Take 60 mg by mouth daily.  Patient not taking: Reported on 12/22/2020   [provider] Not Taking Consider Medication Status and Discontinue (Patient Preference) Spouse/Significant Other  insulin lispro (HUMALOG)  100 UNIT/ML KwikPen Junior [id number]  Inject 0.2 mLs (20 Units total) into the skin 3 (three) times daily after meals.  Patient taking differently: Inject 7-25 Units into the skin 3 (three) times daily after meals. Per sliding scale   [name], MD  Active Spouse/Significant Other  lidocaine (LIDODERM) 5 % [id number] Yes Place 2 patches onto the skin daily. Remove & Discard patch within 12 hours or as directed by MD Flora Lipps, MD Taking Active Spouse/Significant Other  lidocaine-prilocaine (EMLA) cream [id number] No Apply 1 application topically as needed.  Patient not taking: Reported on 02/15/2021   [name], MD Not Taking Consider Medication Status and Discontinue (Patient Preference)   loperamide (IMODIUM) 1 MG/5ML solution [id number] Yes Take 2 mg by mouth daily as needed for diarrhea or loose stools. [provider] Taking Active Spouse/Significant Other  magnesium 30 MG tablet [id number] No Take 30 mg by mouth 2 (two) times daily.  Patient not taking: Reported on 02/15/2021   [provider] Not Taking Consider Medication Status and Discontinue (Patient Preference) Spouse/Significant Other  Menthol-Camphor (TIGER BALM ARTHRITIS RUB EX) [id number] Yes Apply 1 application topically 2 (two) times daily as needed (back pain). [provider] Taking Active Spouse/Significant Other  ondansetron (ZOFRAN) 4 MG tablet [id number] Yes Take 1 tablet (4 mg total) by mouth every 6 (six) hours as needed. for nausea [name], MD Taking Active   Oxycodone HCl 10 MG TABS [id number] Yes Take 1 tablet (10 mg total) by mouth every 4 (four) hours as needed. [name], MD Taking Active   prochlorperazine (COMPAZINE) 10 MG tablet [id number] No Take 1 tablet (10 mg total) by mouth every 6 (six) hours as needed.  Patient not  taking: Reported on 02/15/2021   [name], MD Not Taking Consider Medication Status and Discontinue (Patient Preference)   rosuvastatin  (CRESTOR) 10 MG tablet [id number] Yes Take 1 tablet (10 mg total) by mouth daily.  Patient taking differently: Take 10 mg by mouth every evening.   [name], MD Taking Active   TRESIBA FLEXTOUCH 100 UNIT/ML FlexTouch Pen [id number] Yes Inject 36 Units into the skin at bedtime. [name], MD Taking Active Spouse/Significant Other  Med List Note [name], [facility] 05/01/20 1251): Zytiga filled through [facility]             SDOH:  (Social Determinants of Health) assessments and interventions performed:     Care Plan  Review of patient past medical history, allergies, medications, health status, including review of consultants reports, laboratory and other test data, was performed as part of comprehensive evaluation for care management services.   Care Plan : RNCM Plan of Care  Updates made by [name], RN since 03/20/2021 12:00 AM     Problem: Knoweldge deficit regarding management of health conditions, prostate cancer and DM   Priority: High     Long-Range Goal: Member will show adequate management of chronic conditions, Prostate Cancer and DM (goal altered to include cancer)   Start Date: 10/27/2020  Expected End Date: 04/26/2021  This Visit's Progress: On track  Recent Progress: On track  Priority: High  Note:   Current Barriers:  Knowledge Deficits related to plan of care for management of DMII and Prostate cancer  Chronic Disease Management support and education needs related to DMII and Prostate cancer Transportation barriers Non-adherence to prescribed medication regimen  RNCM Clinical Goal(s):  Patient will verbalize understanding of plan for management of DMII and Prostate cancer as evidenced by no hospital admissions take all medications exactly as prescribed and will call provider for medication related questions as evidenced by reported adherence by member and wife    demonstrate understanding of rationale for each prescribed medication  as evidenced by Insulin [name])    attend all scheduled medical appointments: PCP, oncology, and endocrinology as evidenced by record of attendance at provider offices        continue to work with RN Care Manager and/or Social Worker to address care management and care coordination needs related to DMII and prostate cancer as evidenced by adherence to CM Team Scheduled appointments     demonstrate ongoing self health care management ability to manage DM, including diet, medication management, and activity as evidenced by     through collaboration with RN Care manager, provider, and care team.   Interventions: Inter-disciplinary care team collaboration (see longitudinal plan of care) Evaluation of current treatment plan related to  self management and patient's adherence to plan as established by provider   Diabetes:  (Status: Goal on track: YES.) Lab Results  Component Value Date   HGBA1C 8.5 (H) 08/18/2020  Assessed patient's understanding of A1c goal: <7% Provided education to patient about basic DM disease process; Reviewed medications with patient and discussed importance of medication adherence;        Reviewed prescribed diet with patient low sugars, low carbs; Reviewed scheduled/upcoming provider appointments including: Endocrinology on 10/5;         Advised patient, providing education and rationale, to check cbg 2-3 times a day and record        Assessed social determinant of health barriers;         Oncology:  (Status: Goal on  track:  NO.) Assessment of understanding of oncology diagnosis:  Assessed patient understanding of cancer diagnosis and recommended treatment plan, Reviewed upcoming provider appointments and treatment appointments, and Assessed support system. Has consistent/reliable family or other support: Yes   Update 11/11 - Significant other report member has been "out of it" as his cancer is progressing and requiring more pain medication for relief.  He has been  seen by [facility], now active with the home based palliative care team.  Report he has not been able to stay mobile due to the pain, decreased appetite, drinking boost for nutritional support.  Blood sugars have been elevated as he has not been taking medication appropriately due to not being alert enough.  She has been calls to endocrinology for assistance, changes in insulin noted.  She is not happy with their experience at current PCP, looking to change providers however state it is now difficult to get member out of the house on some days.  Discussed having provider visit the home, advised of the [facility] program, she agrees to referral.    Update 11/21 - Spoke to significant other, confirms that [facility] made home visit on Friday 11/18.  Call placed to [facility] Cecille Rubin), confirmed that they will now be acting as member's primary provider.  They will make another home visit tomorrow to monitor member's status as member has recently had round of chemo.  She continues to have a hard time with managing member's blood sugars, follow labs and appointment with endocrinology scheduled for the next couple weeks.    Patient Goals/Self-Care Activities: Patient will self administer medications as prescribed Patient will attend all scheduled provider appointments Patient will call pharmacy for medication refills Patient will call provider office for new concerns or questions    Update 12/13 - Significant other state member is doing better than he has been the last couple weeks.  He has restarted chemo, PSA has decreased from 382 to 167.  Follow up with oncology on 12/29.  His appetite has increased however they were still having intermittent trouble with diabetes management.  Significant other called endocrinology office to report hypoglycemic episodes, lunch time insulin was decreased.  Since, readings have ranged from 160's-240.  Follow up with endocrinology on 1/5.   Update 1/10 - Member  report he is feeling "much better."  Acknowledges that he was not doing well a few months ago, has progressed with the help of chemo and PT.  Expresses gratitude for this care manager assisting significant other in securing new primary provider.  PSA levels have increased slightly but remain lower than previously (currently 218).  Next oncology appointment scheduled for 1/19.  Was seen by endocrinology on 1/5, most recent A1C was 6, will continue current treatment.  Blood sugar today was 120, will follow up with endocrinology on 4/5.   Update 2/7 [facility] he is not feeling well today, having pain over the last couple weeks.  Rate today at 3/10, generalized.  He is taking Oxycodone with relief, also has Lidocaine patch every 12 hours.  Does report he was able to get up to cook breakfast this morning, which is better then the last few days.  Report blood glucose was about 200 today, but this was after breakfast. Discussed monitoring prior to eating and reviewed insuline doses.  Has follow up with endocrinology on 4/5.  Continue to have chemotherapy sessions however PSA increased to 463 on 1/19.  He will have his next treatment tomorrow and will see  radiology oncology on Friday.  Remains active with [facility] for PCP services, state last visit was last Friday.  Per chart, he has also had home health ordered however he declined to have PT due to pain.  Encouraged to participate with agency to promote optimal health and strength.        Plan:  Telephone follow up appointment with care management team member scheduled for:  1 month The patient has been provided with contact information for the care management team and has been advised to call with any health related questions or concerns.   [name], RN, MSN, [place] Manager [phone]

## 2021-03-21 ENCOUNTER — Telehealth: Payer: Self-pay | Admitting: Oncology

## 2021-03-21 MED FILL — Dexamethasone Sodium Phosphate Inj 100 MG/10ML: INTRAMUSCULAR | Qty: 1 | Status: AC

## 2021-03-21 NOTE — Progress Notes (Signed)
GU Location of Tumor / Histology: Advanced prostate cancer with disease to the bone diagnosed in February 2022.(Right Iliac bone)  If Prostate Cancer, and PSA is (205 as of 10/2020)  Past/Anticipated interventions by medical oncology, if any:  03/01/2021 Dr. Alen Blew  Prior Therapy:   He status post CT-guided biopsy of the right iliac bone.   [name] started on April 20, 2019.   Xtandi 160 mg daily started in April 2021.  Therapy discontinued in March 2022 due to progression of disease.   Zytiga 1000 mg daily with prednisone 5 mg daily started in March 2022.   Taxotere chemotherapy 75 mg per metered square started on June 08, 2020.  He is here for cycle 9 of therapy.   Current therapy:     Eligard 30 mg started in April 2021.  Next Eligard will be given in April 2023.   Jevtana chemotherapy 20 mg per metered square every 3 weeks started on December 28, 2020.  He is here for cycle 4 of therapy.  Weight changes, if any:   30 lb weight loss within six month period time.  IPSS:  23 SHIM:  13   Bowel/Bladder complaints, if any:  Scant possible hemorrhoids. Constipation taking stool softeners.  Has issues with starting and stopping.  Nausea/Vomiting, if any:  Had some issues with nausea taking medication as needed.  Pain issues, if any: 3/10   SAFETY ISSUES: Prior radiation?   No Pacemaker/ICD? No    Possible current pregnancy?  Male Is the patient on methotrexate?  No  Current Complaints / other details:  Port-A-Cath

## 2021-03-21 NOTE — Telephone Encounter (Signed)
Called patient regarding upcoming appointment, spoke with patient's partner. Patient will be notified.

## 2021-03-22 ENCOUNTER — Other Ambulatory Visit: Payer: Self-pay

## 2021-03-22 ENCOUNTER — Inpatient Hospital Stay: Payer: Medicare Other | Attending: Oncology

## 2021-03-22 ENCOUNTER — Inpatient Hospital Stay: Payer: Medicare Other

## 2021-03-22 ENCOUNTER — Inpatient Hospital Stay (HOSPITAL_BASED_OUTPATIENT_CLINIC_OR_DEPARTMENT_OTHER): Payer: Medicare Other | Admitting: Oncology

## 2021-03-22 VITALS — BP 125/61 | HR 99 | Temp 98.1°F | Resp 19 | Ht 73.0 in | Wt 204.6 lb

## 2021-03-22 DIAGNOSIS — T451X5A Adverse effect of antineoplastic and immunosuppressive drugs, initial encounter: Secondary | ICD-10-CM | POA: Diagnosis not present

## 2021-03-22 DIAGNOSIS — D6481 Anemia due to antineoplastic chemotherapy: Secondary | ICD-10-CM | POA: Insufficient documentation

## 2021-03-22 DIAGNOSIS — C7951 Secondary malignant neoplasm of bone: Secondary | ICD-10-CM | POA: Insufficient documentation

## 2021-03-22 DIAGNOSIS — Z5189 Encounter for other specified aftercare: Secondary | ICD-10-CM | POA: Diagnosis not present

## 2021-03-22 DIAGNOSIS — C61 Malignant neoplasm of prostate: Secondary | ICD-10-CM

## 2021-03-22 DIAGNOSIS — Z95828 Presence of other vascular implants and grafts: Secondary | ICD-10-CM

## 2021-03-22 DIAGNOSIS — Z192 Hormone resistant malignancy status: Secondary | ICD-10-CM | POA: Insufficient documentation

## 2021-03-22 DIAGNOSIS — D630 Anemia in neoplastic disease: Secondary | ICD-10-CM | POA: Diagnosis not present

## 2021-03-22 DIAGNOSIS — Z79899 Other long term (current) drug therapy: Secondary | ICD-10-CM | POA: Diagnosis not present

## 2021-03-22 DIAGNOSIS — D649 Anemia, unspecified: Secondary | ICD-10-CM

## 2021-03-22 DIAGNOSIS — Z5111 Encounter for antineoplastic chemotherapy: Secondary | ICD-10-CM | POA: Diagnosis not present

## 2021-03-22 DIAGNOSIS — G893 Neoplasm related pain (acute) (chronic): Secondary | ICD-10-CM | POA: Insufficient documentation

## 2021-03-22 LAB — CMP (CANCER CENTER ONLY)
ALT: 26 U/L (ref 0–44)
AST: 55 U/L — ABNORMAL HIGH (ref 15–41)
Albumin: 3.2 g/dL — ABNORMAL LOW (ref 3.5–5.0)
Alkaline Phosphatase: 431 U/L — ABNORMAL HIGH (ref 38–126)
Anion gap: 10 (ref 5–15)
BUN: 19 mg/dL (ref 8–23)
CO2: 24 mmol/L (ref 22–32)
Calcium: 8.6 mg/dL — ABNORMAL LOW (ref 8.9–10.3)
Chloride: 100 mmol/L (ref 98–111)
Creatinine: 0.79 mg/dL (ref 0.61–1.24)
GFR, Estimated: >60 mL/min (ref >60)
Glucose, Bld: 244 mg/dL — ABNORMAL HIGH (ref 70–99)
Potassium: 4.3 mmol/L (ref 3.5–5.1)
Sodium: 134 mmol/L — ABNORMAL LOW (ref 135–145)
Total Bilirubin: 0.9 mg/dL (ref 0.3–1.2)
Total Protein: 6.9 g/dL (ref 6.5–8.1)

## 2021-03-22 LAB — CBC WITH DIFFERENTIAL (CANCER CENTER ONLY)
Abs Immature Granulocytes: 0.06 10*3/uL (ref 0.00–0.07)
Basophils Absolute: 0 10*3/uL (ref 0.0–0.1)
Basophils Relative: 0 %
Eosinophils Absolute: 0 10*3/uL (ref 0.0–0.5)
Eosinophils Relative: 0 %
HCT: 27 % — ABNORMAL LOW (ref 39.0–52.0)
Hemoglobin: 8.7 g/dL — ABNORMAL LOW (ref 13.0–17.0)
Immature Granulocytes: 1 %
Lymphocytes Relative: 7 %
Lymphs Abs: 0.6 10*3/uL — ABNORMAL LOW (ref 0.7–4.0)
MCH: 30.5 pg (ref 26.0–34.0)
MCHC: 32.2 g/dL (ref 30.0–36.0)
MCV: 94.7 fL (ref 80.0–100.0)
Monocytes Absolute: 0.8 10*3/uL (ref 0.1–1.0)
Monocytes Relative: 10 %
Neutro Abs: 6.7 10*3/uL (ref 1.7–7.7)
Neutrophils Relative %: 82 %
Platelet Count: 162 10*3/uL (ref 150–400)
RBC: 2.85 MIL/uL — ABNORMAL LOW (ref 4.22–5.81)
RDW: 19.4 % — ABNORMAL HIGH (ref 11.5–15.5)
WBC Count: 8.2 10*3/uL (ref 4.0–10.5)
nRBC: 0 % (ref 0.0–0.2)

## 2021-03-22 LAB — SAMPLE TO BLOOD BANK

## 2021-03-22 MED ORDER — FAMOTIDINE IN NACL 20-0.9 MG/50ML-% IV SOLN
20.0000 mg | Freq: Once | INTRAVENOUS | Status: AC
  Administered 2021-03-22: 20 mg via INTRAVENOUS
  Filled 2021-03-22: qty 50

## 2021-03-22 MED ORDER — SODIUM CHLORIDE 0.9 % IV SOLN
10.0000 mg | Freq: Once | INTRAVENOUS | Status: AC
  Administered 2021-03-22: 10 mg via INTRAVENOUS
  Filled 2021-03-22: qty 10

## 2021-03-22 MED ORDER — DIPHENHYDRAMINE HCL 50 MG/ML IJ SOLN
25.0000 mg | Freq: Once | INTRAMUSCULAR | Status: AC
  Administered 2021-03-22: 25 mg via INTRAVENOUS
  Filled 2021-03-22: qty 1

## 2021-03-22 MED ORDER — HEPARIN SOD (PORK) LOCK FLUSH 100 UNIT/ML IV SOLN
500.0000 [IU] | Freq: Once | INTRAVENOUS | Status: AC | PRN
  Administered 2021-03-22: 500 [IU]

## 2021-03-22 MED ORDER — SODIUM CHLORIDE 0.9% FLUSH
10.0000 mL | INTRAVENOUS | Status: AC | PRN
  Administered 2021-03-22: 10 mL

## 2021-03-22 MED ORDER — SODIUM CHLORIDE 0.9 % IV SOLN
20.0000 mg/m2 | Freq: Once | INTRAVENOUS | Status: AC
  Administered 2021-03-22: 44 mg via INTRAVENOUS
  Filled 2021-03-22: qty 4.4

## 2021-03-22 MED ORDER — SODIUM CHLORIDE 0.9 % IV SOLN: Freq: Once | INTRAVENOUS | Status: AC

## 2021-03-22 MED ORDER — SODIUM CHLORIDE 0.9% FLUSH
10.0000 mL | INTRAVENOUS | Status: DC | PRN
  Administered 2021-03-22: 10 mL

## 2021-03-22 NOTE — Patient Instructions (Signed)
[facility]  Discharge Instructions: Thank you for choosing [facility] to provide your oncology and hematology care.   If you have a lab appointment with the [facility], please go directly to the [facility] and check in at the registration area.   Wear comfortable clothing and clothing appropriate for easy access to any Portacath or PICC line.   We strive to give you quality time with your provider. You may need to reschedule your appointment if you arrive late (15 or more minutes).  Arriving late affects you and other patients whose appointments are after yours.  Also, if you miss three or more appointments without notifying the office, you may be dismissed from the clinic at the providers discretion.      For prescription refill requests, have your pharmacy contact our office and allow 72 hours for refills to be completed.    Today you received the following chemotherapy and/or immunotherapy agents: Jevtana      To help prevent nausea and vomiting after your treatment, we encourage you to take your nausea medication as directed.  BELOW ARE SYMPTOMS THAT SHOULD BE REPORTED IMMEDIATELY: *FEVER GREATER THAN 100.4 F (38 C) OR HIGHER *CHILLS OR SWEATING *NAUSEA AND VOMITING THAT IS NOT CONTROLLED WITH YOUR NAUSEA MEDICATION *UNUSUAL SHORTNESS OF BREATH *UNUSUAL BRUISING OR BLEEDING *URINARY PROBLEMS (pain or burning when urinating, or frequent urination) *BOWEL PROBLEMS (unusual diarrhea, constipation, pain near the anus) TENDERNESS IN MOUTH AND THROAT WITH OR WITHOUT PRESENCE OF ULCERS (sore throat, sores in mouth, or a toothache) UNUSUAL RASH, SWELLING OR PAIN  UNUSUAL VAGINAL DISCHARGE OR ITCHING   Items with * indicate a potential emergency and should be followed up as soon as possible or go to the Emergency Department if any problems should occur.  Please show the CHEMOTHERAPY ALERT CARD or IMMUNOTHERAPY ALERT CARD at check-in to  the Emergency Department and triage nurse.  Should you have questions after your visit or need to cancel or reschedule your appointment, please contact [facility]  Dept: [phone]  and follow the prompts.  Office hours are 8:00 a.m. to 4:30 p.m. Monday - Friday. Please note that voicemails left after 4:00 p.m. may not be returned until the following business day.  We are closed weekends and major holidays. You have access to a nurse at all times for urgent questions. Please call the main number to the clinic Dept: [phone] and follow the prompts.   For any non-urgent questions, you may also contact your provider using MyChart. We now offer e-Visits for anyone 33 and older to request care online for non-urgent symptoms. For details visit [email or web address].   Also download the MyChart app! Go to the app store, search "MyChart", open the app, select [facility], and log in with your MyChart username and password.  Due to Covid, a mask is required upon entering the hospital/clinic. If you do not have a mask, one will be given to you upon arrival. For doctor visits, patients may have 1 support person aged 26 or older with them. For treatment visits, patients cannot have anyone with them due to current Covid guidelines and our immunocompromised population.

## 2021-03-22 NOTE — Progress Notes (Signed)
Hematology and Oncology Follow Up Visit  Stephen Lynch [id number] Feb 06, 1946 76 y.o. 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    Prior Therapy:  He status post CT-guided biopsy of the right iliac bone.  [name] started on April 20, 2019.  Xtandi 160 mg daily started in April 2021.  Therapy discontinued in March 2022 due to progression of disease.  Zytiga 1000 mg daily with prednisone 5 mg daily started in March 2022.  Taxotere chemotherapy 75 mg per metered square started on June 08, 2020.  He is here for cycle 9 of therapy.  Current therapy:    Eligard 30 mg started in April 2021.  Next Eligard will be given in April 2023.  Jevtana chemotherapy 20 mg per metered square every 3 weeks started on December 28, 2020.  He is here for cycle 5 of therapy.   Interim History: Stephen Lynch returns today for a follow-up visit.  Since the last visit, he reports few complaints including increase in his pain and fatigue.  He has reported increased shoulder and lower back pain associated with increased disability.  He is taking oxycodone for his pain medication at this time.  His performance status has declined with decrease in his appetite and further weight loss.  Medications: Updated on review. Current Outpatient Medications  Medication Sig Dispense Refill   ASPIRIN 81 PO Take 81 mg by mouth daily.      Calcium-Magnesium-Zinc 333-133-5 MG TABS Take 1 tablet by mouth daily.     Chromium 1 MG CAPS Take by mouth.     Continuous Blood Gluc Sensor (FREESTYLE LIBRE 2 SENSOR) MISC APPLY EVERY 14 DAYS, CHECK GLUCOSE BEFORE EACH MEAL, 2 HRS AFTER, IN THE MORNING AND BEFORE BEDTIME 2 each 2   DULoxetine (CYMBALTA) 60 MG capsule Take 60 mg by mouth daily. (Patient not taking: Reported on 12/22/2020)     HUMALOG KWIKPEN 100 UNIT/ML KwikPen 20 Units  in the morning, at noon, and at bedtime. 14units before breakfast, 5 units before lunch, and 14 units before dinner     insulin lispro (HUMALOG) 100 UNIT/ML KwikPen Junior Inject 0.2 mLs (20 Units total) into the skin 3 (three) times daily after meals. (Patient taking differently: Inject 7-25 Units into the skin 3 (three) times daily after meals. Per sliding scale) 3 mL 6   lidocaine (LIDODERM) 5 % Place 2 patches onto the skin daily. Remove & Discard patch within 12 hours or as directed by MD 30 patch 0   lidocaine-prilocaine (EMLA) cream Apply 1 application topically as needed. (Patient not taking: Reported on 02/15/2021) 30 g 0   loperamide (IMODIUM) 1 MG/5ML solution Take 2 mg by mouth daily as needed for diarrhea or loose stools.     magnesium 30 MG tablet Take 30 mg by mouth 2 (two) times daily. (Patient not taking: Reported on 02/15/2021)     Menthol-Camphor (TIGER BALM ARTHRITIS RUB EX) Apply 1 application topically 2 (two) times daily as needed (back pain).     ondansetron (ZOFRAN) 4 MG tablet Take 1 tablet (4 mg total) by mouth every 6 (six) hours as needed. for nausea 20 tablet 1   Oxycodone HCl 10 MG TABS Take 1 tablet (10 mg total) by mouth every 4 (four) hours as needed. 90 tablet 0   prochlorperazine (COMPAZINE) 10 MG tablet Take 1 tablet (10 mg total) by mouth every 6 (  six) hours as needed. 30 tablet 3   rosuvastatin (CRESTOR) 10 MG tablet Take 1 tablet (10 mg total) by mouth daily. (Patient taking differently: Take 10 mg by mouth every evening.) 90 tablet 3   TRESIBA FLEXTOUCH 100 UNIT/ML FlexTouch Pen INJECT 36 UNITS INTO THE SKIN AT BEDTIME. 15 mL 3   No current facility-administered medications for this visit.     Allergies:  Allergies  Allergen Reactions   Chlorhexidine       Physical Exam:  Blood pressure 125/61, pulse 99, temperature 98.1 F (36.7 C), temperature source Temporal, resp. rate 19, height 6\' 1"  (1.854 m), weight 204 lb 9.6 oz (92.8 kg), SpO2 98  %.    ECOG: 2     General appearance: Alert, awake without any distress. Head: Atraumatic without abnormalities Oropharynx: Without any thrush or ulcers. Eyes: No scleral icterus. Lymph nodes: No lymphadenopathy noted in the cervical, supraclavicular, or axillary nodes Heart:regular rate and rhythm, without any murmurs or gallops.   Lung: Clear to auscultation without any rhonchi, wheezes or dullness to percussion. Abdomin: Soft, nontender without any shifting dullness or ascites. Musculoskeletal: No clubbing or cyanosis. Neurological: No motor or sensory deficits. Skin: No rashes or lesions. Psychiatric: Mood and affect appeared normal.                  Lab Results: Lab Results  Component Value Date   WBC 6.3 03/01/2021   HGB 8.7 (L) 03/01/2021   HCT 26.2 (L) 03/01/2021   MCV 97.0 03/01/2021   PLT 154 03/01/2021     Chemistry      Component Value Date/Time   NA 133 (L) 03/01/2021 0817   K 4.5 03/01/2021 0817   CL 99 03/01/2021 0817   CO2 24 03/01/2021 0817   BUN 23 03/01/2021 0817   CREATININE 0.85 03/01/2021 0817      Component Value Date/Time   CALCIUM 8.9 03/01/2021 0817   ALKPHOS 458 (H) 03/01/2021 0817   AST 66 (H) 03/01/2021 0817   ALT 33 03/01/2021 0817   BILITOT 0.7 03/01/2021 0817        Latest Reference Range & Units 02/08/21 11:38 03/01/21 08:17  Prostate Specific Ag, Serum 0.0 - 4.0 ng/mL 218.0 (H) 463.0 (H)  (H): Data is abnormally high       Impression and Plan:   76 year old man with:  1.  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    He is currently on Jevtana chemotherapy with reasonable clinical benefit and a PSA decline initially although he is experiencing a quick relapse.  Management choices moving forward were discussed which includes continuing Jevtana, switching to Pluvicto versus supportive care only.  The rapid rise in his PSA certainly concerning for  developing refractory disease.  After discussion today, we opted to proceed with Jevtana chemotherapy given the lack of alternative options.  He will receive palliative radiation therapy temporarily and will consider future treatment options with the next visit.  It is possible to transition to hospice at that time given his rapid decline.  2.  Androgen deprivation: He will continue Eligard indefinitely with the next injection scheduled for April 2023.   3.  Growth factor support: I recommended continuing growth factor support after each cycle of therapy given his risk of neutropenia and sepsis.   4.  Prognosis and goals of care: His disease is incurable although aggressive measures are warranted given his reasonable performance status.   5.  Anemia: Related  to malignancy and chemotherapy.  He has received transfusion in the past and we will continue to monitor.  6.  Bone pain: Related to advanced prostate cancer.  He is currently on oxycodone with pain is manageable.  Long-acting pain medication has been tried previously with morphine and fentanyl which she has not preferred.   7.  IV access: Port-A-Cath remains in place and accessed for chemotherapy.  8. Follow-up: In 3 weeks for repeat follow-up.    30  minutes were spent on this visit.  The time was dedicated to reviewing laboratory data, disease status update outlining future plan of care.  Zola Button, MD 2/9/20239:10 AM

## 2021-03-23 ENCOUNTER — Ambulatory Visit
Admission: RE | Admit: 2021-03-23 | Discharge: 2021-03-23 | Disposition: A | Discharge status: Home / Self Care | Payer: Medicare Other | Source: Ambulatory Visit | Attending: Radiation Oncology | Admitting: Radiation Oncology

## 2021-03-23 VITALS — BP 150/74 | HR 81 | Temp 96.2°F | Resp 18 | Ht 73.0 in | Wt 218.2 lb

## 2021-03-23 DIAGNOSIS — Z7982 Long term (current) use of aspirin: Secondary | ICD-10-CM | POA: Insufficient documentation

## 2021-03-23 DIAGNOSIS — C61 Malignant neoplasm of prostate: Secondary | ICD-10-CM | POA: Diagnosis not present

## 2021-03-23 DIAGNOSIS — E11649 Type 2 diabetes mellitus with hypoglycemia without coma: Secondary | ICD-10-CM | POA: Diagnosis not present

## 2021-03-23 DIAGNOSIS — R9721 Rising PSA following treatment for malignant neoplasm of prostate: Secondary | ICD-10-CM | POA: Insufficient documentation

## 2021-03-23 DIAGNOSIS — E785 Hyperlipidemia, unspecified: Secondary | ICD-10-CM | POA: Diagnosis not present

## 2021-03-23 DIAGNOSIS — I10 Essential (primary) hypertension: Secondary | ICD-10-CM | POA: Insufficient documentation

## 2021-03-23 DIAGNOSIS — Z79899 Other long term (current) drug therapy: Secondary | ICD-10-CM | POA: Diagnosis not present

## 2021-03-23 DIAGNOSIS — C7931 Secondary malignant neoplasm of brain: Secondary | ICD-10-CM | POA: Diagnosis not present

## 2021-03-23 DIAGNOSIS — C7951 Secondary malignant neoplasm of bone: Secondary | ICD-10-CM | POA: Diagnosis not present

## 2021-03-23 DIAGNOSIS — E119 Type 2 diabetes mellitus without complications: Secondary | ICD-10-CM | POA: Insufficient documentation

## 2021-03-23 DIAGNOSIS — G8929 Other chronic pain: Secondary | ICD-10-CM | POA: Diagnosis not present

## 2021-03-23 DIAGNOSIS — Z794 Long term (current) use of insulin: Secondary | ICD-10-CM | POA: Insufficient documentation

## 2021-03-23 LAB — PROSTATE-SPECIFIC AG, SERUM (LABCORP): Prostate Specific Ag, Serum: 490 ng/mL — ABNORMAL HIGH (ref 0.0–4.0)

## 2021-03-23 NOTE — Progress Notes (Signed)
Introduced myself to the patient as the prostate nurse navigator.  No barriers to care identified at this time.  He is here to discuss his radiation treatment options.  I gave him my business card and asked him to call me with questions or concerns.  Verbalized understanding.  ?

## 2021-03-23 NOTE — Progress Notes (Signed)
Radiation Oncology         [phone] ________________________________  Initial Outpatient Consultation  Name: Stephen Lynch MRN: [id number]  Date: 03/23/2021  DOB: August 03, 1945  AQ:[id number]  Shadad, Mathis Dad, MD   REFERRING PHYSICIAN: [name], MD  DIAGNOSIS: 76 y.o. 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    ICD-10-CM   1. Malignant neoplasm of prostate ([place])  C61     2. Malignant neoplasm of prostate metastatic to bone ([place])  C61    C79.51     3. Metastasis to spinal column ([facility])  C79.51       HISTORY OF PRESENT ILLNESS: Stephen Lynch is a 76 y.o. male with a diagnosis of metastatic prostate cancer. In summary, he initially presented to the emergency department with severe back pain in 03/2019. He was found to have diffuse bony metastasis and an elevated PSA of 193. He was referred to Dr. Alen Blew and started on Firmagon 04/20/19. He underwent right iliac bone biopsy on 04/22/19, which confirmed metastatic adenocarcinoma, compatible with prostatic primary. He received Xtandi from 05/2019 through 04/2020, discontinued due to disease progression. He was switched to [facility] with prednisone at that time again had disease progression and was switched to Taxotere chemo on 06/08/20.  More recently, he underwent restaging PSMA PET scan on 11/14/20 showing widespread intensely radiotracer-avid metastatic prostate cancer within the axillary appendicular skeleton without evidence of radiotracer-avid disease within the prostate gland and no lymphadenopathy or visceral metastasis. Given the lack of benefit from Taxotere and his decline in health within the following month after his scan, he was switched to [facility] chemotherapy on 12/28/20 and also continues on ADT with Eligard every 4 months. Despite and initial response in PSA with the [facility], his PSA has continued to rise, from 167 on 01/18/21 to  490 on 03/22/21.   Given the rising PSA and his increasing shoulder and low back pain, the patient has kindly been referred today for discussion of palliative radiation treatment options.  PREVIOUS RADIATION THERAPY: No  PAST MEDICAL HISTORY:  Past Medical History:  Diagnosis Date   Cancer ([facility])    prostate   Diabetes mellitus without complication ([place])    Dyslipidemia    Hypertension       PAST SURGICAL HISTORY: Past Surgical History:  Procedure Laterality Date   EYE SURGERY     IR IMAGING GUIDED PORT INSERTION  06/12/2020    FAMILY HISTORY:  Family History  Problem Relation Age of Onset   CAD Mother 46   Other Father 83       age   Other Brother 65       unknown cause    SOCIAL HISTORY:  Social History   Socioeconomic History   Marital status: Legally Separated    Spouse name: Not on file   Number of children: Not on file   Years of education: Not on file   Highest education level: Not on file  Occupational History   Occupation: retired Air cabin crew at the [employer] Use   Smoking status: Former    Packs/day: 0.50    Years: 15.00    Pack years: 7.50    Types: Cigarettes   Smokeless tobacco: Never  Substance and Sexual Activity   Alcohol use: Not Currently    Comment: h/o heavy use, quit about 2010   Drug use: Not Currently    Comment: h/o use with cocaine, marijuana,  quit about 2010   Sexual activity: Not on file  Other Topics Concern   Not on file  Social History Narrative   Not on file   Social Determinants of Health   Financial Resource Strain: Not on file  Food Insecurity: Not on file  Transportation Needs: Not on file  Physical Activity: Not on file  Stress: Not on file  Social Connections: Not on file  Intimate Partner Violence: Not on file    ALLERGIES: Chlorhexidine  MEDICATIONS:  Current Outpatient Medications  Medication Sig Dispense Refill   ASPIRIN 81 PO Take 81 mg by mouth daily.      B-D ULTRAFINE III SHORT PEN 31G X 8 MM  MISC Inject into the skin 3 (three) times daily.     Calcium-Magnesium-Zinc 333-133-5 MG TABS Take 1 tablet by mouth daily.     Chromium 1 MG CAPS Take by mouth.     Continuous Blood Gluc Sensor (FREESTYLE LIBRE 2 SENSOR) MISC APPLY EVERY 14 DAYS, CHECK GLUCOSE BEFORE EACH MEAL, 2 HRS AFTER, IN THE MORNING AND BEFORE BEDTIME 2 each 2   DULoxetine (CYMBALTA) 60 MG capsule Take 60 mg by mouth daily. (Patient not taking: Reported on 12/22/2020)     HUMALOG KWIKPEN 100 UNIT/ML KwikPen 20 Units in the morning, at noon, and at bedtime. 14units before breakfast, 5 units before lunch, and 14 units before dinner     insulin lispro (HUMALOG) 100 UNIT/ML KwikPen Junior Inject 0.2 mLs (20 Units total) into the skin 3 (three) times daily after meals. (Patient taking differently: Inject 7-25 Units into the skin 3 (three) times daily after meals. Per sliding scale) 3 mL 6   lidocaine (LIDODERM) 5 % Place 2 patches onto the skin daily. Remove & Discard patch within 12 hours or as directed by MD 30 patch 0   lidocaine-prilocaine (EMLA) cream Apply 1 application topically as needed. (Patient not taking: Reported on 02/15/2021) 30 g 0   loperamide (IMODIUM) 1 MG/5ML solution Take 2 mg by mouth daily as needed for diarrhea or loose stools.     magnesium 30 MG tablet Take 30 mg by mouth 2 (two) times daily. (Patient not taking: Reported on 02/15/2021)     Menthol-Camphor (TIGER BALM ARTHRITIS RUB EX) Apply 1 application topically 2 (two) times daily as needed (back pain).     ondansetron (ZOFRAN) 4 MG tablet Take 1 tablet (4 mg total) by mouth every 6 (six) hours as needed. for nausea 20 tablet 1   Oxycodone HCl 10 MG TABS Take 1 tablet (10 mg total) by mouth every 4 (four) hours as needed. 90 tablet 0   prochlorperazine (COMPAZINE) 10 MG tablet Take 1 tablet (10 mg total) by mouth every 6 (six) hours as needed. 30 tablet 3   rosuvastatin (CRESTOR) 10 MG tablet Take 1 tablet (10 mg total) by mouth daily. (Patient taking  differently: Take 10 mg by mouth every evening.) 90 tablet 3   TRESIBA FLEXTOUCH 100 UNIT/ML FlexTouch Pen INJECT 36 UNITS INTO THE SKIN AT BEDTIME. 15 mL 3   No current facility-administered medications for this encounter.    REVIEW OF SYSTEMS:  On review of systems, the patient reports that he is doing well overall. He denies any chest pain, shortness of breath, cough, fevers, chills, night sweats. He reports a 30 lb weight loss in the last 6 months. He denies any bowel disturbances, and denies abdominal pain, nausea or vomiting. He describes his pain today as 3/10 but it increases with  any activity.  He has some diffuse bony pain but reports that the most painful sites are in both of his shoulders and his low back.  He will occasionally get pain that shoots down his legs as well as paresthesias in the lower extremities with weightbearing activity.  His IPSS was 23, indicating moderate urinary symptoms. His SHIM was 13, indicating he has moderate erectile dysfunction. A complete review of systems is obtained and is otherwise negative.    PHYSICAL EXAM:  Wt Readings from Last 3 Encounters:  03/23/21 218 lb 4 oz (99 kg)  03/22/21 204 lb 9.6 oz (92.8 kg)  03/01/21 213 lb 4.8 oz (96.8 kg)   Temp Readings from Last 3 Encounters:  03/23/21 (!) 96.2 F (35.7 C) (Temporal)  03/22/21 98.1 F (36.7 C) (Temporal)  03/03/21 (!) 97.5 F (36.4 C) (Temporal)   BP Readings from Last 3 Encounters:  03/23/21 (!) 150/74  03/22/21 125/61  03/03/21 (!) 151/70   Pulse Readings from Last 3 Encounters:  03/23/21 81  03/22/21 99  03/03/21 93   Pain Assessment Pain Score: 3  Pain Loc: Abdomen/10  In general this is a well appearing African-American male in no acute distress. He's alert and oriented x4 and appropriate throughout the examination. Cardiopulmonary assessment is negative for acute distress, and he exhibits normal effort.     KPS = 60  100 - Normal; no complaints; no evidence of  disease. 90   - Able to carry on normal activity; minor signs or symptoms of disease. 80   - Normal activity with effort; some signs or symptoms of disease. 46   - Cares for self; unable to carry on normal activity or to do active work. 60   - Requires occasional assistance, but is able to care for most of his personal needs. 50   - Requires considerable assistance and frequent medical care. 71   - Disabled; requires special care and assistance. 72   - Severely disabled; hospital admission is indicated although death not imminent. 101   - Very sick; hospital admission necessary; active supportive treatment necessary. 10   - Moribund; fatal processes progressing rapidly. 0     - Dead  Karnofsky DA, Abelmann Campbell, Craver LS and Burchenal [facility] [phone]) The use of the nitrogen mustards in the palliative treatment of carcinoma: with particular reference to bronchogenic carcinoma Cancer 1 634-56  LABORATORY DATA:  Lab Results  Component Value Date   WBC 8.2 03/22/2021   HGB 8.7 (L) 03/22/2021   HCT 27.0 (L) 03/22/2021   MCV 94.7 03/22/2021   PLT 162 03/22/2021   Lab Results  Component Value Date   NA 134 (L) 03/22/2021   K 4.3 03/22/2021   CL 100 03/22/2021   CO2 24 03/22/2021   Lab Results  Component Value Date   ALT 26 03/22/2021   AST 55 (H) 03/22/2021   ALKPHOS 431 (H) 03/22/2021   BILITOT 0.9 03/22/2021     RADIOGRAPHY: No results found.    IMPRESSION/PLAN: 1. 76 y.o. gentleman with castrate resistant metastatic prostate cancer with painful osseous metastatic lesions in the shoulders and lumbar spine. Today, we talked to the patient and his wife about the findings and workup thus far. We discussed the natural history of metastatic prostate cancer and general treatment, highlighting the role of palliative radiotherapy in the management of painful osseous metastasis. We discussed the available radiation techniques, and focused on the details and logistics of delivery.  The  recommendation is to proceed  with a 2-week course of daily palliative radiotherapy to the painful lesions in the bilateral shoulders and lumbar spine at L3.  We reviewed the anticipated acute and late sequelae associated with radiation in this setting. The patient was encouraged to ask questions that were answered to his stated satisfaction.  At the conclusion of our conversation, the patient is interested in moving forward with the recommended 2-week course of palliative radiotherapy to the bilateral shoulders and lumbar spine at L3.  He has freely signed written consent to proceed today in the office and a copy of this document will be placed in his medical record.  He will be scheduled for CT simulation/treatment planning in anticipation of beginning his daily treatments in the near future.  We will share our discussion with Dr. Alen Blew and proceed with treatment planning accordingly.  We enjoyed meeting him and his wife today and look forward to continue to participate in his care.  We personally spent 60 minutes in this encounter including chart review, reviewing radiological studies, meeting face-to-face with the patient, entering orders and completing documentation.    [name], PA-C    Tyler Pita, MD  [facility] Direct Dial: [phone]   Fax: [phone] [email or web address]   Skype   LinkedIn   This document serves as a record of services personally performed by Tyler Pita, MD and Freeman Caldron, PA-C. It was created on their behalf by Wilburn Mylar, a trained medical scribe. The creation of this record is based on the scribe's personal observations and the provider's statements to them. This document has been checked and approved by the attending provider.

## 2021-03-24 ENCOUNTER — Inpatient Hospital Stay: Payer: Medicare Other

## 2021-03-24 ENCOUNTER — Other Ambulatory Visit: Payer: Self-pay

## 2021-03-24 VITALS — BP 139/66 | HR 87 | Temp 97.5°F | Resp 19

## 2021-03-24 DIAGNOSIS — C7951 Secondary malignant neoplasm of bone: Secondary | ICD-10-CM | POA: Diagnosis not present

## 2021-03-24 DIAGNOSIS — Z5189 Encounter for other specified aftercare: Secondary | ICD-10-CM | POA: Diagnosis not present

## 2021-03-24 DIAGNOSIS — Z5111 Encounter for antineoplastic chemotherapy: Secondary | ICD-10-CM | POA: Diagnosis not present

## 2021-03-24 DIAGNOSIS — D630 Anemia in neoplastic disease: Secondary | ICD-10-CM | POA: Diagnosis not present

## 2021-03-24 DIAGNOSIS — Z192 Hormone resistant malignancy status: Secondary | ICD-10-CM | POA: Diagnosis not present

## 2021-03-24 DIAGNOSIS — C61 Malignant neoplasm of prostate: Secondary | ICD-10-CM | POA: Diagnosis not present

## 2021-03-24 MED ORDER — PEGFILGRASTIM-CBQV 6 MG/0.6ML ~~LOC~~ SOSY
6.0000 mg | PREFILLED_SYRINGE | Freq: Once | SUBCUTANEOUS | Status: AC
  Administered 2021-03-24: 6 mg via SUBCUTANEOUS
  Filled 2021-03-24: qty 0.6

## 2021-03-24 NOTE — Patient Instructions (Signed)

## 2021-03-26 ENCOUNTER — Telehealth: Payer: Self-pay

## 2021-03-26 ENCOUNTER — Other Ambulatory Visit: Payer: Self-pay | Admitting: Oncology

## 2021-03-26 ENCOUNTER — Other Ambulatory Visit: Payer: Self-pay

## 2021-03-26 ENCOUNTER — Ambulatory Visit
Admission: RE | Admit: 2021-03-26 | Discharge: 2021-03-26 | Disposition: A | Discharge status: Home / Self Care | Payer: Medicare Other | Source: Ambulatory Visit | Attending: Radiation Oncology | Admitting: Radiation Oncology

## 2021-03-26 ENCOUNTER — Other Ambulatory Visit: Payer: Self-pay | Admitting: *Deleted

## 2021-03-26 DIAGNOSIS — C61 Malignant neoplasm of prostate: Secondary | ICD-10-CM | POA: Insufficient documentation

## 2021-03-26 DIAGNOSIS — C7951 Secondary malignant neoplasm of bone: Secondary | ICD-10-CM | POA: Diagnosis not present

## 2021-03-26 DIAGNOSIS — Z95828 Presence of other vascular implants and grafts: Secondary | ICD-10-CM

## 2021-03-26 DIAGNOSIS — Z510 Encounter for antineoplastic radiation therapy: Secondary | ICD-10-CM | POA: Diagnosis not present

## 2021-03-26 MED ORDER — OXYCODONE HCL 10 MG PO TABS: 10.0000 mg | ORAL_TABLET | ORAL | 0 refills | Status: DC | PRN

## 2021-03-26 MED ORDER — LIDOCAINE-PRILOCAINE 2.5-2.5 % EX CREA: 1.0000 "application " | TOPICAL_CREAM | CUTANEOUS | 0 refills | Status: DC | PRN

## 2021-03-26 MED ORDER — PROCHLORPERAZINE MALEATE 10 MG PO TABS: 10.0000 mg | ORAL_TABLET | Freq: Four times a day (QID) | ORAL | 3 refills | Status: AC | PRN

## 2021-03-26 MED ORDER — ONDANSETRON HCL 4 MG PO TABS: 4.0000 mg | ORAL_TABLET | Freq: Four times a day (QID) | ORAL | 1 refills | Status: DC | PRN

## 2021-03-26 NOTE — Telephone Encounter (Signed)
Stephen Lynch partner called wanting to know if appointment today should be cancel due to vomiting/diarrhea took medication to help both issues and was asked to call back before appointment time.  She forgot to call so this RN called them to found out if Stephen Lynch was well enough to come in this afternoon.  He is going to be in for his scheduled appointment.  Nothing else follows.

## 2021-03-26 NOTE — Progress Notes (Signed)
°  Radiation Oncology         [phone] ________________________________  Name: Ellsworth Waldschmidt MRN: [id number]  Date: 03/26/2021  DOB: 1945-05-27  SIMULATION AND TREATMENT PLANNING NOTE    ICD-10-CM   1. Prostate cancer ([place])  C61       DIAGNOSIS:  76 y.o. 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  NARRATIVE:  The patient was brought to the [place].  Identity was confirmed.  All relevant records and images related to the planned course of therapy were reviewed.  The patient freely provided informed written consent to proceed with treatment after reviewing the details related to the planned course of therapy. The consent form was witnessed and verified by the simulation staff.  Then, the patient was set-up in a stable reproducible  supine position for radiation therapy.  CT images were obtained.  Surface markings were placed.  The CT images were loaded into the planning software.  Then the target and avoidance structures were contoured including kidneys.  Treatment planning then occurred.  The radiation prescription was entered and confirmed.  Then, I designed and supervised the construction of a total of 3 medically necessary complex treatment devices with VacLoc positioner and 2 MLCs to shield kidneys.  I have requested : 3D Simulation  I have requested a DVH of the following structures: Left Kidney, Right Kidney and target.  PLAN:  The patient will receive 30 Gy in 10 fractions to L3, L4 and bilateral shoulders.  ________________________________  Sheral Apley Tammi Klippel, M.D.

## 2021-03-28 ENCOUNTER — Ambulatory Visit
Admission: RE | Admit: 2021-03-28 | Discharge: 2021-03-28 | Disposition: A | Discharge status: Home / Self Care | Payer: Medicare Other | Source: Ambulatory Visit | Attending: Radiation Oncology | Admitting: Radiation Oncology

## 2021-03-28 ENCOUNTER — Other Ambulatory Visit: Payer: Self-pay

## 2021-03-28 ENCOUNTER — Telehealth: Payer: Self-pay

## 2021-03-28 DIAGNOSIS — C7951 Secondary malignant neoplasm of bone: Secondary | ICD-10-CM | POA: Diagnosis not present

## 2021-03-28 DIAGNOSIS — C61 Malignant neoplasm of prostate: Secondary | ICD-10-CM | POA: Diagnosis not present

## 2021-03-28 DIAGNOSIS — Z510 Encounter for antineoplastic radiation therapy: Secondary | ICD-10-CM | POA: Diagnosis not present

## 2021-03-28 NOTE — Telephone Encounter (Signed)
Received fax from [facility] that office notes need to be faxed to [phone] so medicare can cover sensors and reader. Information faxed.

## 2021-03-29 ENCOUNTER — Ambulatory Visit
Admission: RE | Admit: 2021-03-29 | Discharge: 2021-03-29 | Disposition: A | Discharge status: Home / Self Care | Payer: Medicare Other | Source: Ambulatory Visit | Attending: Radiation Oncology | Admitting: Radiation Oncology

## 2021-03-29 DIAGNOSIS — C61 Malignant neoplasm of prostate: Secondary | ICD-10-CM | POA: Diagnosis not present

## 2021-03-29 DIAGNOSIS — Z510 Encounter for antineoplastic radiation therapy: Secondary | ICD-10-CM | POA: Diagnosis not present

## 2021-03-29 DIAGNOSIS — C7951 Secondary malignant neoplasm of bone: Secondary | ICD-10-CM | POA: Diagnosis not present

## 2021-03-30 ENCOUNTER — Ambulatory Visit
Admission: RE | Admit: 2021-03-30 | Discharge: 2021-03-30 | Disposition: A | Discharge status: Home / Self Care | Payer: Medicare Other | Source: Ambulatory Visit | Attending: Radiation Oncology | Admitting: Radiation Oncology

## 2021-03-30 ENCOUNTER — Other Ambulatory Visit: Payer: Self-pay

## 2021-03-30 DIAGNOSIS — C7951 Secondary malignant neoplasm of bone: Secondary | ICD-10-CM | POA: Diagnosis not present

## 2021-03-30 DIAGNOSIS — Z510 Encounter for antineoplastic radiation therapy: Secondary | ICD-10-CM | POA: Diagnosis not present

## 2021-03-30 DIAGNOSIS — C61 Malignant neoplasm of prostate: Secondary | ICD-10-CM | POA: Diagnosis not present

## 2021-04-02 ENCOUNTER — Encounter: Payer: Self-pay | Admitting: Endocrinology

## 2021-04-02 ENCOUNTER — Ambulatory Visit
Admission: RE | Admit: 2021-04-02 | Discharge: 2021-04-02 | Disposition: A | Discharge status: Home / Self Care | Payer: Medicare Other | Source: Ambulatory Visit | Attending: Radiation Oncology | Admitting: Radiation Oncology

## 2021-04-02 DIAGNOSIS — Z510 Encounter for antineoplastic radiation therapy: Secondary | ICD-10-CM | POA: Diagnosis not present

## 2021-04-02 DIAGNOSIS — C61 Malignant neoplasm of prostate: Secondary | ICD-10-CM | POA: Diagnosis not present

## 2021-04-02 DIAGNOSIS — C7951 Secondary malignant neoplasm of bone: Secondary | ICD-10-CM | POA: Diagnosis not present

## 2021-04-03 ENCOUNTER — Ambulatory Visit
Admission: RE | Admit: 2021-04-03 | Discharge: 2021-04-03 | Disposition: A | Discharge status: Home / Self Care | Payer: Medicare Other | Source: Ambulatory Visit | Attending: Radiation Oncology | Admitting: Radiation Oncology

## 2021-04-03 ENCOUNTER — Other Ambulatory Visit: Payer: Self-pay

## 2021-04-03 DIAGNOSIS — C61 Malignant neoplasm of prostate: Secondary | ICD-10-CM | POA: Diagnosis not present

## 2021-04-03 DIAGNOSIS — Z510 Encounter for antineoplastic radiation therapy: Secondary | ICD-10-CM | POA: Diagnosis not present

## 2021-04-03 DIAGNOSIS — C7951 Secondary malignant neoplasm of bone: Secondary | ICD-10-CM | POA: Diagnosis not present

## 2021-04-04 ENCOUNTER — Ambulatory Visit
Admission: RE | Admit: 2021-04-04 | Discharge: 2021-04-04 | Disposition: A | Discharge status: Home / Self Care | Payer: Medicare Other | Source: Ambulatory Visit | Attending: Radiation Oncology | Admitting: Radiation Oncology

## 2021-04-04 DIAGNOSIS — C61 Malignant neoplasm of prostate: Secondary | ICD-10-CM | POA: Diagnosis not present

## 2021-04-04 DIAGNOSIS — Z510 Encounter for antineoplastic radiation therapy: Secondary | ICD-10-CM | POA: Diagnosis not present

## 2021-04-04 DIAGNOSIS — C7951 Secondary malignant neoplasm of bone: Secondary | ICD-10-CM | POA: Diagnosis not present

## 2021-04-05 ENCOUNTER — Other Ambulatory Visit: Payer: Self-pay

## 2021-04-05 ENCOUNTER — Ambulatory Visit
Admission: RE | Admit: 2021-04-05 | Discharge: 2021-04-05 | Disposition: A | Discharge status: Home / Self Care | Payer: Medicare Other | Source: Ambulatory Visit | Attending: Radiation Oncology | Admitting: Radiation Oncology

## 2021-04-05 DIAGNOSIS — C7951 Secondary malignant neoplasm of bone: Secondary | ICD-10-CM | POA: Diagnosis not present

## 2021-04-05 DIAGNOSIS — Z510 Encounter for antineoplastic radiation therapy: Secondary | ICD-10-CM | POA: Diagnosis not present

## 2021-04-05 DIAGNOSIS — C61 Malignant neoplasm of prostate: Secondary | ICD-10-CM | POA: Diagnosis not present

## 2021-04-06 ENCOUNTER — Ambulatory Visit
Admission: RE | Admit: 2021-04-06 | Discharge: 2021-04-06 | Disposition: A | Discharge status: Home / Self Care | Payer: Medicare Other | Source: Ambulatory Visit | Attending: Radiation Oncology | Admitting: Radiation Oncology

## 2021-04-06 DIAGNOSIS — C7951 Secondary malignant neoplasm of bone: Secondary | ICD-10-CM | POA: Diagnosis not present

## 2021-04-06 DIAGNOSIS — C61 Malignant neoplasm of prostate: Secondary | ICD-10-CM | POA: Diagnosis not present

## 2021-04-06 DIAGNOSIS — Z510 Encounter for antineoplastic radiation therapy: Secondary | ICD-10-CM | POA: Diagnosis not present

## 2021-04-09 ENCOUNTER — Other Ambulatory Visit: Payer: Self-pay

## 2021-04-09 ENCOUNTER — Ambulatory Visit
Admission: RE | Admit: 2021-04-09 | Discharge: 2021-04-09 | Disposition: A | Discharge status: Home / Self Care | Payer: Medicare Other | Source: Ambulatory Visit | Attending: Radiation Oncology | Admitting: Radiation Oncology

## 2021-04-09 DIAGNOSIS — Z510 Encounter for antineoplastic radiation therapy: Secondary | ICD-10-CM | POA: Diagnosis not present

## 2021-04-09 DIAGNOSIS — C61 Malignant neoplasm of prostate: Secondary | ICD-10-CM | POA: Diagnosis not present

## 2021-04-09 DIAGNOSIS — C7951 Secondary malignant neoplasm of bone: Secondary | ICD-10-CM | POA: Diagnosis not present

## 2021-04-10 ENCOUNTER — Ambulatory Visit
Admission: RE | Admit: 2021-04-10 | Discharge: 2021-04-10 | Disposition: A | Discharge status: Home / Self Care | Payer: Medicare Other | Source: Ambulatory Visit | Attending: Radiation Oncology | Admitting: Radiation Oncology

## 2021-04-10 ENCOUNTER — Encounter: Payer: Self-pay | Admitting: Urology

## 2021-04-10 DIAGNOSIS — C7951 Secondary malignant neoplasm of bone: Secondary | ICD-10-CM | POA: Diagnosis not present

## 2021-04-10 DIAGNOSIS — Z510 Encounter for antineoplastic radiation therapy: Secondary | ICD-10-CM | POA: Diagnosis not present

## 2021-04-10 DIAGNOSIS — C61 Malignant neoplasm of prostate: Secondary | ICD-10-CM

## 2021-04-10 MED FILL — Dexamethasone Sodium Phosphate Inj 100 MG/10ML: INTRAMUSCULAR | Qty: 1 | Status: AC

## 2021-04-11 ENCOUNTER — Other Ambulatory Visit: Payer: Self-pay

## 2021-04-11 ENCOUNTER — Inpatient Hospital Stay: Payer: Medicare Other

## 2021-04-11 ENCOUNTER — Inpatient Hospital Stay (HOSPITAL_BASED_OUTPATIENT_CLINIC_OR_DEPARTMENT_OTHER): Payer: Medicare Other | Admitting: Oncology

## 2021-04-11 ENCOUNTER — Inpatient Hospital Stay: Payer: Medicare Other | Attending: Oncology

## 2021-04-11 VITALS — BP 151/68 | HR 91 | Temp 97.7°F | Resp 18 | Ht 73.0 in | Wt 207.6 lb

## 2021-04-11 DIAGNOSIS — Z5111 Encounter for antineoplastic chemotherapy: Secondary | ICD-10-CM | POA: Insufficient documentation

## 2021-04-11 DIAGNOSIS — Z5189 Encounter for other specified aftercare: Secondary | ICD-10-CM | POA: Diagnosis not present

## 2021-04-11 DIAGNOSIS — Z9221 Personal history of antineoplastic chemotherapy: Secondary | ICD-10-CM | POA: Diagnosis not present

## 2021-04-11 DIAGNOSIS — Z79899 Other long term (current) drug therapy: Secondary | ICD-10-CM | POA: Insufficient documentation

## 2021-04-11 DIAGNOSIS — C61 Malignant neoplasm of prostate: Secondary | ICD-10-CM

## 2021-04-11 DIAGNOSIS — M898X9 Other specified disorders of bone, unspecified site: Secondary | ICD-10-CM | POA: Diagnosis not present

## 2021-04-11 DIAGNOSIS — Z794 Long term (current) use of insulin: Secondary | ICD-10-CM | POA: Diagnosis not present

## 2021-04-11 DIAGNOSIS — Z192 Hormone resistant malignancy status: Secondary | ICD-10-CM | POA: Diagnosis not present

## 2021-04-11 DIAGNOSIS — D649 Anemia, unspecified: Secondary | ICD-10-CM | POA: Diagnosis not present

## 2021-04-11 DIAGNOSIS — Z923 Personal history of irradiation: Secondary | ICD-10-CM | POA: Insufficient documentation

## 2021-04-11 LAB — CMP (CANCER CENTER ONLY)
ALT: 16 U/L (ref 0–44)
AST: 43 U/L — ABNORMAL HIGH (ref 15–41)
Albumin: 3.3 g/dL — ABNORMAL LOW (ref 3.5–5.0)
Alkaline Phosphatase: 505 U/L — ABNORMAL HIGH (ref 38–126)
Anion gap: 8 (ref 5–15)
BUN: 12 mg/dL (ref 8–23)
CO2: 26 mmol/L (ref 22–32)
Calcium: 8.6 mg/dL — ABNORMAL LOW (ref 8.9–10.3)
Chloride: 101 mmol/L (ref 98–111)
Creatinine: 0.74 mg/dL (ref 0.61–1.24)
GFR, Estimated: >60 mL/min (ref >60)
Glucose, Bld: 343 mg/dL — ABNORMAL HIGH (ref 70–99)
Potassium: 4.4 mmol/L (ref 3.5–5.1)
Sodium: 135 mmol/L (ref 135–145)
Total Bilirubin: 0.8 mg/dL (ref 0.3–1.2)
Total Protein: 6.9 g/dL (ref 6.5–8.1)

## 2021-04-11 LAB — CBC WITH DIFFERENTIAL (CANCER CENTER ONLY)
Abs Immature Granulocytes: 0.05 10*3/uL (ref 0.00–0.07)
Basophils Absolute: 0 10*3/uL (ref 0.0–0.1)
Basophils Relative: 0 %
Eosinophils Absolute: 0 10*3/uL (ref 0.0–0.5)
Eosinophils Relative: 0 %
HCT: 27.8 % — ABNORMAL LOW (ref 39.0–52.0)
Hemoglobin: 9 g/dL — ABNORMAL LOW (ref 13.0–17.0)
Immature Granulocytes: 1 %
Lymphocytes Relative: 3 %
Lymphs Abs: 0.2 10*3/uL — ABNORMAL LOW (ref 0.7–4.0)
MCH: 30.4 pg (ref 26.0–34.0)
MCHC: 32.4 g/dL (ref 30.0–36.0)
MCV: 93.9 fL (ref 80.0–100.0)
Monocytes Absolute: 1 10*3/uL (ref 0.1–1.0)
Monocytes Relative: 13 %
Neutro Abs: 6.7 10*3/uL (ref 1.7–7.7)
Neutrophils Relative %: 83 %
Platelet Count: 157 10*3/uL (ref 150–400)
RBC: 2.96 MIL/uL — ABNORMAL LOW (ref 4.22–5.81)
RDW: 20.2 % — ABNORMAL HIGH (ref 11.5–15.5)
WBC Count: 8 10*3/uL (ref 4.0–10.5)
nRBC: 0 % (ref 0.0–0.2)

## 2021-04-11 LAB — SAMPLE TO BLOOD BANK

## 2021-04-11 MED ORDER — HEPARIN SOD (PORK) LOCK FLUSH 100 UNIT/ML IV SOLN
500.0000 [IU] | Freq: Once | INTRAVENOUS | Status: AC | PRN
  Administered 2021-04-11: 500 [IU]

## 2021-04-11 MED ORDER — SODIUM CHLORIDE 0.9 % IV SOLN
10.0000 mg | Freq: Once | INTRAVENOUS | Status: AC
  Administered 2021-04-11: 10 mg via INTRAVENOUS
  Filled 2021-04-11: qty 10

## 2021-04-11 MED ORDER — SODIUM CHLORIDE 0.9% FLUSH
10.0000 mL | INTRAVENOUS | Status: AC | PRN
  Administered 2021-04-11: 10 mL

## 2021-04-11 MED ORDER — DIPHENHYDRAMINE HCL 50 MG/ML IJ SOLN
25.0000 mg | Freq: Once | INTRAMUSCULAR | Status: AC
  Administered 2021-04-11: 25 mg via INTRAVENOUS
  Filled 2021-04-11: qty 1

## 2021-04-11 MED ORDER — OXYCODONE HCL 10 MG PO TABS: 10.0000 mg | ORAL_TABLET | ORAL | 0 refills | Status: DC | PRN

## 2021-04-11 MED ORDER — SODIUM CHLORIDE 0.9% FLUSH
10.0000 mL | INTRAVENOUS | Status: DC | PRN
  Administered 2021-04-11: 10 mL

## 2021-04-11 MED ORDER — ONDANSETRON HCL 4 MG PO TABS: 4.0000 mg | ORAL_TABLET | Freq: Four times a day (QID) | ORAL | 3 refills | Status: DC | PRN

## 2021-04-11 MED ORDER — SODIUM CHLORIDE 0.9 % IV SOLN
20.0000 mg/m2 | Freq: Once | INTRAVENOUS | Status: AC
  Administered 2021-04-11: 44 mg via INTRAVENOUS
  Filled 2021-04-11: qty 4.4

## 2021-04-11 MED ORDER — SODIUM CHLORIDE 0.9 % IV SOLN: Freq: Once | INTRAVENOUS | Status: AC

## 2021-04-11 MED ORDER — FAMOTIDINE IN NACL 20-0.9 MG/50ML-% IV SOLN
20.0000 mg | Freq: Once | INTRAVENOUS | Status: AC
  Administered 2021-04-11: 20 mg via INTRAVENOUS
  Filled 2021-04-11: qty 50

## 2021-04-11 NOTE — Progress Notes (Signed)
Hematology and Oncology Follow Up Visit ? ?Stephen Lynch ?[id number] ?03-19-1945 76 y.o. ?04/11/2021 11:36 AM ?[facility]  ? ?Principle Diagnosis: 76 year old man with advanced prostate cancer with disease to the bone diagnosed in February 2021.  He has castration-resistant at this time. ? ?Prior Therapy: ? ?He status post CT-guided biopsy of the right iliac bone. ? ?[name] started on April 20, 2019. ? ?Xtandi 160 mg daily started in April 2021.  Therapy discontinued in March 2022 due to progression of disease. ? ?Zytiga 1000 mg daily with prednisone 5 mg daily started in March 2022. ? ?Taxotere chemotherapy 75 mg per metered square started on June 08, 2020.  He is here for cycle 9 of therapy. ? ?Radiation therapy to L3 and L4 bilateral shoulders completed in February 2023.  He received 30 Gray in 10 fractions. ? ?Current therapy:  ? ? Eligard 30 mg started in April 2021.  Next Eligard will be given in April 2023. ? ?Jevtana chemotherapy 20 mg per metered square every 3 weeks started on December 28, 2020.  He is here for cycle 6 of therapy. ? ? ? ? ?Interim History: Stephen Lynch presents today for repeat evaluation.  Since the last visit, he had completed the radiation therapy to the spine and shoulders with improvement in his pain.  He denies any nausea, vomiting or abdominal pain.  He is eating less but overall weight has stabilized.  He denies any hospitalizations or illnesses.  Denies any nausea vomiting or abdominal discomfort.  Follow status and quality of life remains.  He is ambulating with the help of a cane today and not using wheelchair. ? ?Medications: Reviewed without changes. ?Current Outpatient Medications  ?Medication Sig Dispense Refill  ? ASPIRIN 81 PO Take 81 mg by mouth daily.     ? B-D ULTRAFINE III SHORT PEN 31G X 8 MM MISC Inject into the skin 3 (three) times daily.    ? Calcium-Magnesium-Zinc 333-133-5 MG TABS Take 1 tablet by mouth daily.    ? Chromium 1  MG CAPS Take by mouth.    ? Continuous Blood Gluc Sensor (FREESTYLE LIBRE 2 SENSOR) MISC APPLY EVERY 14 DAYS, CHECK GLUCOSE BEFORE EACH MEAL, 2 HRS AFTER, IN THE MORNING AND BEFORE BEDTIME 2 each 2  ? DULoxetine (CYMBALTA) 60 MG capsule Take 60 mg by mouth daily. (Patient not taking: Reported on 12/22/2020)    ? HUMALOG KWIKPEN 100 UNIT/ML KwikPen 20 Units in the morning, at noon, and at bedtime. 14units before breakfast, 5 units before lunch, and 14 units before dinner    ? insulin lispro (HUMALOG) 100 UNIT/ML KwikPen Junior Inject 0.2 mLs (20 Units total) into the skin 3 (three) times daily after meals. (Patient taking differently: Inject 7-25 Units into the skin 3 (three) times daily after meals. Per sliding scale) 3 mL 6  ? lidocaine (LIDODERM) 5 % Place 2 patches onto the skin daily. Remove & Discard patch within 12 hours or as directed by MD 30 patch 0  ? lidocaine-prilocaine (EMLA) cream Apply 1 application topically as needed. 30 g 0  ? loperamide (IMODIUM) 1 MG/5ML solution Take 2 mg by mouth daily as needed for diarrhea or loose stools.    ? magnesium 30 MG tablet Take 30 mg by mouth 2 (two) times daily. (Patient not taking: Reported on 02/15/2021)    ? Menthol-Camphor (TIGER BALM ARTHRITIS RUB EX) Apply 1 application topically 2 (two) times daily as needed (back pain).    ? ondansetron (  ZOFRAN) 4 MG tablet Take 1 tablet (4 mg total) by mouth every 6 (six) hours as needed. for nausea 20 tablet 1  ? Oxycodone HCl 10 MG TABS Take 1 tablet (10 mg total) by mouth every 4 (four) hours as needed. 90 tablet 0  ? prochlorperazine (COMPAZINE) 10 MG tablet Take 1 tablet (10 mg total) by mouth every 6 (six) hours as needed. 30 tablet 3  ? rosuvastatin (CRESTOR) 10 MG tablet Take 1 tablet (10 mg total) by mouth daily. (Patient taking differently: Take 10 mg by mouth every evening.) 90 tablet 3  ? TRESIBA FLEXTOUCH 100 UNIT/ML FlexTouch Pen INJECT 36 UNITS INTO THE SKIN AT BEDTIME. 15 mL 3  ? ?No current  facility-administered medications for this visit.  ? ? ? ?Allergies:  ?Allergies  ?Allergen Reactions  ? Chlorhexidine   ? ? ? ? ?Physical Exam: ? ?Blood pressure (!) 151/68, pulse 91, temperature 97.7 ?F (36.5 ?C), temperature source Temporal, resp. rate 18, height 6\' 1"  (1.854 m), weight 207 lb 9.6 oz (94.2 kg), SpO2 100 %. ? ? ? ? ?ECOG: 2 ?  ? ? ?General appearance: Comfortable appearing without any discomfort ?Head: Normocephalic without any trauma ?Oropharynx: Mucous membranes are moist and pink without any thrush or ulcers. ?Eyes: Pupils are equal and round reactive to light. ?Lymph nodes: No cervical, supraclavicular, inguinal or axillary lymphadenopathy.   ?Heart:regular rate and rhythm.  S1 and S2 without leg edema. ?Lung: Clear without any rhonchi or wheezes.  No dullness to percussion. ?Abdomin: Soft, nontender, nondistended with good bowel sounds.  No hepatosplenomegaly. ?Musculoskeletal: No joint deformity or effusion.  Full range of motion noted. ?Neurological: No deficits noted on motor, sensory and deep tendon reflex exam. ?Skin: No petechial rash or dryness.  Appeared moist.  ? ? ? ? ? ? ? ? ? ? ? ? ? ? ? ? ? ?Lab Results: ?Lab Results  ?Component Value Date  ? WBC 8.2 03/22/2021  ? HGB 8.7 (L) 03/22/2021  ? HCT 27.0 (L) 03/22/2021  ? MCV 94.7 03/22/2021  ? PLT 162 03/22/2021  ? ?  Chemistry   ?   ?Component Value Date/Time  ? NA 134 (L) 03/22/2021 0944  ? K 4.3 03/22/2021 0944  ? CL 100 03/22/2021 0944  ? CO2 24 03/22/2021 0944  ? BUN 19 03/22/2021 0944  ? CREATININE 0.79 03/22/2021 0944  ?    ?Component Value Date/Time  ? CALCIUM 8.6 (L) 03/22/2021 0944  ? ALKPHOS 431 (H) 03/22/2021 0944  ? AST 55 (H) 03/22/2021 0944  ? ALT 26 03/22/2021 0944  ? BILITOT 0.9 03/22/2021 0944  ?  ? ? ? ? ? Latest Reference Range & Units 02/08/21 11:38 03/01/21 08:17 03/22/21 09:44  ?Prostate Specific Ag, Serum 0.0 - 4.0 ng/mL 218.0 (H) 463.0 (H) 490.0 (H)  ?(H): Data is abnormally high ? ? ? ?Impression and  Plan: ? ? ?76 year old man with: ? ?1.  Advanced prostate cancer with disease to the bone diagnosed in February 2022.  He has castration-resistant resistant disease at this time. ? ?He is currently on Jevtana which she has tolerated reasonably well without any major complications.  His PSA is rising however and treatment options were discussed.  Continuing Jevtana versus consideration for Pluvicto were discussed.  I fear that that his disease milligram related to rapidly consider Pluvicto treatment at this time.  After discussion, we will proceed with Jevtana chemotherapy and will continue to assess moving forward.  He is benefiting clinically at this  time we will continue to monitor for the time being. ? ? ?2.  Androgen deprivation: He is currently on Eligard and will be repeated in in April 2023. ?  ?3.  Growth factor support: He is at risk of developing neutropenia and sepsis.  And will require growth factor support after each cycle. ? ? ?4.  Prognosis and goals of care: Therapy remains palliative although aggressive measures are warranted. ? ? ?5.  Anemia: His hemoglobin is adequate and does not require any transfusion at this time. ? ?6.  Bone pain: Improved overall with the help of radiation.  Continues to use oxycodone which will be refilled for him. ? ? ?7.  IV access: Port-A-Cath currently in use without any issues. ? ?8. Follow-up: In 3 weeks for repeat follow-up. ? ?  ?30  minutes were dedicated to this encounter.  The time was spent on reviewing laboratory data, disease status update, treatment choices and addressing complication related to cancer and cancer therapy. ? ?Zola Button, MD ?3/1/202311:36 AM ? ?

## 2021-04-11 NOTE — Patient Instructions (Signed)
Stephen Lynch  Discharge Instructions: ?Thank you for choosing [facility] to provide your oncology and hematology care.  ? ?If you have a lab appointment with the [facility], please go directly to the [facility] and check in at the registration area. ?  ?Wear comfortable clothing and clothing appropriate for easy access to any Portacath or PICC line.  ? ?We strive to give you quality time with your provider. You may need to reschedule your appointment if you arrive late (15 or more minutes).  Arriving late affects you and other patients whose appointments are after yours.  Also, if you miss three or more appointments without notifying the office, you may be dismissed from the clinic at the provider?s discretion.    ?  ?For prescription refill requests, have your pharmacy contact our office and allow 72 hours for refills to be completed.   ? ?Today you received the following chemotherapy and/or immunotherapy agents Jevtana    ?  ?To help prevent nausea and vomiting after your treatment, we encourage you to take your nausea medication as directed. ? ?BELOW ARE SYMPTOMS THAT SHOULD BE REPORTED IMMEDIATELY: ?*FEVER GREATER THAN 100.4 F (38 ?C) OR HIGHER ?*CHILLS OR SWEATING ?*NAUSEA AND VOMITING THAT IS NOT CONTROLLED WITH YOUR NAUSEA MEDICATION ?*UNUSUAL SHORTNESS OF BREATH ?*UNUSUAL BRUISING OR BLEEDING ?*URINARY PROBLEMS (pain or burning when urinating, or frequent urination) ?*BOWEL PROBLEMS (unusual diarrhea, constipation, pain near the anus) ?TENDERNESS IN MOUTH AND THROAT WITH OR WITHOUT PRESENCE OF ULCERS (sore throat, sores in mouth, or a toothache) ?UNUSUAL RASH, SWELLING OR PAIN  ?UNUSUAL VAGINAL DISCHARGE OR ITCHING  ? ?Items with * indicate a potential emergency and should be followed up as soon as possible or go to the Emergency Department if any problems should occur. ? ?Please show the CHEMOTHERAPY ALERT CARD or IMMUNOTHERAPY ALERT CARD at check-in to the  Emergency Department and triage nurse. ? ?Should you have questions after your visit or need to cancel or reschedule your appointment, please contact [facility]  Dept: [phone]  and follow the prompts.  Office hours are 8:00 a.m. to 4:30 p.m. Monday - Friday. Please note that voicemails left after 4:00 p.m. may not be returned until the following business day.  We are closed weekends and major holidays. You have access to a nurse at all times for urgent questions. Please call the main number to the clinic Dept: [phone] and follow the prompts. ? ? ?For any non-urgent questions, you may also contact your provider using MyChart. We now offer e-Visits for anyone 75 and older to request care online for non-urgent symptoms. For details visit [email or web address]. ?  ?Also download the MyChart app! Go to the app store, search "MyChart", open the app, select [facility], and log in with your MyChart username and password. ? ?Due to Covid, a mask is required upon entering the hospital/clinic. If you do not have a mask, one will be given to you upon arrival. For doctor visits, patients may have 1 support person aged 2 or older with them. For treatment visits, patients cannot have anyone with them due to current Covid guidelines and our immunocompromised population.  ? ?

## 2021-04-12 ENCOUNTER — Telehealth: Payer: Self-pay | Admitting: *Deleted

## 2021-04-12 ENCOUNTER — Other Ambulatory Visit: Payer: Self-pay | Admitting: *Deleted

## 2021-04-12 LAB — PROSTATE-SPECIFIC AG, SERUM (LABCORP): Prostate Specific Ag, Serum: 241 ng/mL — ABNORMAL HIGH (ref 0.0–4.0)

## 2021-04-12 NOTE — Patient Outreach (Signed)
[facility]) Care Management Telephonic RN Care Manager Note   04/12/2021 Name:  Stephen Lynch MRN:  [id number] DOB:  [birth year]  Summary: Stephen Lynch call placed to member, successful.  Denies any urgent concerns, encouraged to contact this care manager with questions.    Recommendations/Changes made from today's visit: Obtain sensor from pharmacy, monitor blood sugar at least 3 times a day.    Subjective: Stephen Lynch is an 76 y.o. year old male who is a primary patient of [facility]. The care management team was consulted for assistance with care management and/or care coordination needs.    Telephonic RN Care Manager completed Telephone Visit today.  Objective:   Medications Reviewed Today     Reviewed by Ara Kussmaul, RN (Registered Nurse) on 04/11/21 at 51  Med List Status: <None>   Medication Order Taking? Sig Documenting Provider Last Dose Status Informant  ASPIRIN 81 PO [id number] No Take 81 mg by mouth daily.  [provider] Taking Active Spouse/Significant Other  B-D ULTRAFINE III SHORT PEN 31G X 8 MM MISC [id number]  Inject into the skin 3 (three) times daily. [provider]  Active   Calcium-Magnesium-Zinc [phone] MG TABS [id number] No Take 1 tablet by mouth daily. [provider] Taking Active Spouse/Significant Other  Chromium 1 MG CAPS [id number] No Take by mouth. [provider] Taking Active Spouse/Significant Other  Continuous Blood Gluc Sensor (FREESTYLE LIBRE 2 SENSOR) MISC [id number]  APPLY EVERY 14 DAYS, CHECK GLUCOSE BEFORE EACH MEAL, 2 HRS AFTER, IN THE MORNING AND BEFORE BEDTIME [name], MD  Active   DULoxetine (CYMBALTA) 60 MG capsule [id number] No Take 60 mg by mouth daily.  Patient not taking: Reported on 12/22/2020   [provider] Not Taking Active Spouse/Significant Other  HUMALOG KWIKPEN 100 UNIT/ML KwikPen [id number]  20 Units in the morning, at noon, and at bedtime.  14units before breakfast, 5 units before lunch, and 14 units before dinner [provider]  Active   insulin lispro (HUMALOG) 100 UNIT/ML KwikPen Junior [id number] No Inject 0.2 mLs (20 Units total) into the skin 3 (three) times daily after meals.  Patient taking differently: Inject 7-25 Units into the skin 3 (three) times daily after meals. Per sliding scale   [name], MD Taking Active Spouse/Significant Other  lidocaine (LIDODERM) 5 % [id number] No Place 2 patches onto the skin daily. Remove & Discard patch within 12 hours or as directed by MD Flora Lipps, MD Taking Active Spouse/Significant Other  lidocaine-prilocaine (EMLA) cream [id number]  Apply 1 application topically as needed. [name], MD  Active   loperamide (IMODIUM) 1 MG/5ML solution [id number] No Take 2 mg by mouth daily as needed for diarrhea or loose stools. [provider] Taking Active Spouse/Significant Other  magnesium 30 MG tablet [id number] No Take 30 mg by mouth 2 (two) times daily.  Patient not taking: Reported on 02/15/2021   [provider] Not Taking Active Spouse/Significant Other  Menthol-Camphor Johny Sax [facility] ARTHRITIS RUB EX) [id number] No Apply 1 application topically 2 (two) times daily as needed (back pain). [provider] Taking Active Spouse/Significant Other  ondansetron (ZOFRAN) 4 MG tablet [id number]  Take 1 tablet (4 mg total) by mouth every 6 (six) hours as needed. for nausea [name], MD  Active   Oxycodone HCl 10 MG TABS [id number]  Take 1 tablet (10 mg total) by mouth every 4 (four) hours as needed. [name], MD  Active   prochlorperazine (COMPAZINE)  10 MG tablet [id number]  Take 1 tablet (10 mg total) by mouth every 6 (six) hours as needed. [name], MD  Active   rosuvastatin (CRESTOR) 10 MG tablet [id number] No Take 1 tablet (10 mg total) by mouth daily.  Patient taking differently: Take 10 mg by mouth every evening.   [name], MD  Taking Active   TRESIBA FLEXTOUCH 100 UNIT/ML FlexTouch Pen [id number]  INJECT 36 UNITS INTO THE SKIN AT BEDTIME. [name], MD  Active   Med List Note [name], [facility] 05/01/20 1251): Zytiga filled through [facility]             SDOH:  (Social Determinants of Health) assessments and interventions performed:     Care Plan  Review of patient past medical history, allergies, medications, health status, including review of consultants reports, laboratory and other test data, was performed as part of comprehensive evaluation for care management services.   Care Plan : RNCM Plan of Care  Updates made by [name], RN since 04/12/2021 12:00 AM     Problem: Knoweldge deficit regarding management of health conditions, prostate cancer and DM   Priority: High     Long-Range Goal: Member will show adequate management of chronic conditions, Prostate Cancer and DM (goal altered to include cancer)   Start Date: 10/27/2020  Expected End Date: 04/26/2021  This Visit's Progress: On track  Recent Progress: On track  Priority: High  Note:   Current Barriers:  Knowledge Deficits related to plan of care for management of DMII and Prostate cancer  Chronic Disease Management support and education needs related to DMII and Prostate cancer Transportation barriers Non-adherence to prescribed medication regimen  RNCM Clinical Goal(s):  Patient will verbalize understanding of plan for management of DMII and Prostate cancer as evidenced by no hospital admissions take all medications exactly as prescribed and will call provider for medication related questions as evidenced by reported adherence by member and wife    demonstrate understanding of rationale for each prescribed medication as evidenced by Insulin [name])    attend all scheduled medical appointments: PCP, oncology, and endocrinology as evidenced by record of attendance at provider offices        continue to work with  RN Care Manager and/or Social Worker to address care management and care coordination needs related to DMII and prostate cancer as evidenced by adherence to CM Team Scheduled appointments     demonstrate ongoing self health care management ability to manage DM, including diet, medication management, and activity as evidenced by     through collaboration with RN Care manager, provider, and care team.   Interventions: Inter-disciplinary care team collaboration (see longitudinal plan of care) Evaluation of current treatment plan related to  self management and patient's adherence to plan as established by provider   Diabetes:  (Status: Goal Met.) Lab Results  Component Value Date   HGBA1C 6.0 02/13/2021  Assessed patient's understanding of A1c goal: <7% Provided education to patient about basic DM disease process; Reviewed medications with patient and discussed importance of medication adherence;        Reviewed prescribed diet with patient low sugars, low carbs; Reviewed scheduled/upcoming provider appointments including: Endocrinology on 10/5;         Advised patient, providing education and rationale, to check cbg 2-3 times a day and record        Assessed social determinant of health barriers;         Oncology:  (Status:  Goal on track:  NO.) Assessment of understanding of oncology diagnosis:  Assessed patient understanding of cancer diagnosis and recommended treatment plan, Reviewed upcoming provider appointments and treatment appointments, and Assessed support system. Has consistent/reliable family or other support: Yes   Update 11/11 - Significant other report member has been "out of it" as his cancer is progressing and requiring more pain medication for relief.  He has been seen by [facility], now active with the home based palliative care team.  Report he has not been able to stay mobile due to the pain, decreased appetite, drinking boost for nutritional support.  Blood sugars have  been elevated as he has not been taking medication appropriately due to not being alert enough.  She has been calls to endocrinology for assistance, changes in insulin noted.  She is not happy with their experience at current PCP, looking to change providers however state it is now difficult to get member out of the house on some days.  Discussed having provider visit the home, advised of the [facility] program, she agrees to referral.    Update 11/21 - Spoke to significant other, confirms that [facility] made home visit on Friday 11/18.  Call placed to [facility] Cecille Rubin), confirmed that they will now be acting as member's primary provider.  They will make another home visit tomorrow to monitor member's status as member has recently had round of chemo.  She continues to have a hard time with managing member's blood sugars, follow labs and appointment with endocrinology scheduled for the next couple weeks.    Patient Goals/Self-Care Activities: Patient will self administer medications as prescribed Patient will attend all scheduled provider appointments Patient will call pharmacy for medication refills Patient will call provider office for new concerns or questions    Update 12/13 - Significant other state member is doing better than he has been the last couple weeks.  He has restarted chemo, PSA has decreased from 382 to 167.  Follow up with oncology on 12/29.  His appetite has increased however they were still having intermittent trouble with diabetes management.  Significant other called endocrinology office to report hypoglycemic episodes, lunch time insulin was decreased.  Since, readings have ranged from 160's-240.  Follow up with endocrinology on 1/5.   Update 1/10 - Member report he is feeling "much better."  Acknowledges that he was not doing well a few months ago, has progressed with the help of chemo and PT.  Expresses gratitude for this care manager assisting significant other in  securing new primary provider.  PSA levels have increased slightly but remain lower than previously (currently 218).  Next oncology appointment scheduled for 1/19.  Was seen by endocrinology on 1/5, most recent A1C was 6, will continue current treatment.  Blood sugar today was 120, will follow up with endocrinology on 4/5.   Update 2/7 [facility] he is not feeling well today, having pain over the last couple weeks.  Rate today at 3/10, generalized.  He is taking Oxycodone with relief, also has Lidocaine patch every 12 hours.  Does report he was able to get up to cook breakfast this morning, which is better then the last few days.  Report blood glucose was about 200 today, but this was after breakfast. Discussed monitoring prior to eating and reviewed insuline doses.  Has follow up with endocrinology on 4/5.  Continue to have chemotherapy sessions however PSA increased to 463 on 1/19.  He will have his next treatment tomorrow and  will see radiology oncology on Friday.  Remains active with [facility] for PCP services, state last visit was last Friday.  Per chart, he has also had home health ordered however he declined to have PT due to pain.  Encouraged to participate with agency to promote optimal health and strength.   Update 3/2 - Member report he has been doing better.  State his pain is not as bad, using Oxycodone and lidocaine patches.  He has started radiation treatments in addition to his chemo treatments.  Had daily radiation for the last 2 weeks, will follow up with radiology oncology 3/29 to determine if he will need additional sessions.  Scheduled for injection at cancer center tomorrow, at which time he will have get an updated treatment schedule.  Does not know what his blood sugar is today, state sensor came off during the night but he has called in for a refill.  Blood sugar was 140 yesterday.  Remains slightly confused in regards to insulin treatment, reviewed orders and importance of adhering  to diet.  Will follow up with endocrinology on 4/5, repeat A1C on 4/3.  He is unsure when next home visit with [facility] is, significant other unavailable to confirm appointment but member state it is next month.        Plan:  Telephone follow up appointment with care management team member scheduled for:  1 month The patient has been provided with contact information for the care management team and has been advised to call with any health related questions or concerns.   [name], RN, MSN, [facility] Manager [phone]

## 2021-04-12 NOTE — Telephone Encounter (Signed)
Per Dr.Shadad, called pt with message below. Pt wife verbalized understanding ?

## 2021-04-12 NOTE — Telephone Encounter (Signed)
-----   Message from [name], MD sent at 04/12/2021  9:30 AM EST ----- ?Please let him know his PSA is down ?

## 2021-04-13 ENCOUNTER — Other Ambulatory Visit: Payer: Self-pay

## 2021-04-13 ENCOUNTER — Inpatient Hospital Stay: Payer: Medicare Other

## 2021-04-13 VITALS — BP 135/66 | HR 82 | Temp 98.1°F | Resp 18

## 2021-04-13 DIAGNOSIS — Z192 Hormone resistant malignancy status: Secondary | ICD-10-CM | POA: Diagnosis not present

## 2021-04-13 DIAGNOSIS — M898X9 Other specified disorders of bone, unspecified site: Secondary | ICD-10-CM | POA: Diagnosis not present

## 2021-04-13 DIAGNOSIS — D649 Anemia, unspecified: Secondary | ICD-10-CM | POA: Diagnosis not present

## 2021-04-13 DIAGNOSIS — Z5189 Encounter for other specified aftercare: Secondary | ICD-10-CM | POA: Diagnosis not present

## 2021-04-13 DIAGNOSIS — Z5111 Encounter for antineoplastic chemotherapy: Secondary | ICD-10-CM | POA: Diagnosis not present

## 2021-04-13 DIAGNOSIS — C61 Malignant neoplasm of prostate: Secondary | ICD-10-CM

## 2021-04-13 MED ORDER — PEGFILGRASTIM-CBQV 6 MG/0.6ML ~~LOC~~ SOSY
6.0000 mg | PREFILLED_SYRINGE | Freq: Once | SUBCUTANEOUS | Status: AC
  Administered 2021-04-13: 6 mg via SUBCUTANEOUS
  Filled 2021-04-13: qty 0.6

## 2021-04-19 ENCOUNTER — Telehealth: Payer: Self-pay | Admitting: Oncology

## 2021-04-19 ENCOUNTER — Other Ambulatory Visit: Payer: Self-pay

## 2021-04-19 ENCOUNTER — Other Ambulatory Visit: Payer: Medicare Other | Admitting: Hospice

## 2021-04-19 DIAGNOSIS — Z515 Encounter for palliative care: Secondary | ICD-10-CM

## 2021-04-19 DIAGNOSIS — K5901 Slow transit constipation: Secondary | ICD-10-CM | POA: Diagnosis not present

## 2021-04-19 DIAGNOSIS — R52 Pain, unspecified: Secondary | ICD-10-CM | POA: Diagnosis not present

## 2021-04-19 DIAGNOSIS — C7951 Secondary malignant neoplasm of bone: Secondary | ICD-10-CM

## 2021-04-19 DIAGNOSIS — C61 Malignant neoplasm of prostate: Secondary | ICD-10-CM | POA: Diagnosis not present

## 2021-04-19 NOTE — Telephone Encounter (Signed)
Scheduled per 03/01 los, patient has been called and notified. ?

## 2021-04-19 NOTE — Progress Notes (Signed)
? ? ?Manufacturing engineer ?Community Palliative Care Consult Note ?Telephone: [phone]  ?Fax: [phone] ? ?PATIENT NAME: Stephen Lynch ?DOB: 1945-04-05 ?MRN: [id number] ? ?PRIMARY CARE PROVIDER:   [facility] ?[facility] ?[place],  [place] [id number] ? ?REFERRING PROVIDER: [facility] ?[facility] ?[address] ?[place] [id number] ? ?RESPONSIBLE PARTY:  Self ?[name] is best number to call ?Contact Information   ? ? Name Relation Home Work Mobile  ? Milas Gain Significant other [phone]  [phone]  ? ?  ?TELEHEALTH VISIT STATEMENT ?Due to the COVID-19 crisis, this visit was done via telemedicine from my office and it was initiated and consent by this patient and or family. Video-audio (telehealth) contact was unable to be done due to technical barriers from the patient?s side. ?I connected with patient OR PROXY by a telephone  and verified that I am speaking with the correct person. I discussed the limitations of evaluation and management by telemedicine. The patient expressed understanding and agreed to proceed.  ? ?Visit is to build trust and highlight Palliative Medicine as specialized medical care for people living with serious illness, aimed at facilitating better quality of life through symptoms relief, assisting with advance care planning and complex medical decision making.  [name] is present with patient during visit.  This is a follow up visit. ? ?RECOMMENDATIONS/PLAN:  ? ?Advance Care Planning/Code Status: Reviewed code status today. Patient affirms he is a partial code.   He desires chest compressions with ACLS medications, no intubation/mechanical ventilation. ? ?Goals of Care: Goals of care include to maximize quality of life and symptom management. ? ?Visit consisted of counseling and education dealing with the complex and emotionally intense issues of symptom management and palliative care in the  setting of serious and potentially life-threatening illness. Palliative care team will continue to support patient, patient's family, and medical team. ?I spent  16 minutes providing this initial consultation. More than 50% of the time in this consultation was spent on counseling patient and coordinating communication. ?-------------------------------------------------------------------------------------------------------------------------------------- ? ?Symptom management/Plan:  ?Metastatic Prostate cancer: Completed radiation; continuing on Chemotherapy every 3 weeks. Follow-up with oncologist as planned. ?Weakness: PT/OT completed.  Currently ambulatory with cane at home. Fall precautions reiterated.  ?Generalized Pain: Managed with Oxycodone. Effective.  ?Constipation: Managed with MiraLAX ?Type 2 DM: A1c in Jan 23 is 6.0, July 2022 is  8.7% down from 10.2% May 2022.  Continue insulin and Tresiba as ordered.  Continue with diabetic diet. Continue blood sugar monitoring. Repeat A1c, routine CBC CMP. ? ?Follow up: Palliative care will continue to follow for complex medical decision making, advance care planning, and clarification of goals. Return 6 weeks or prn. Encouraged to call provider sooner with any concerns. ? ?CHIEF COMPLAINT: Palliative follow up ? ?HISTORY OF PRESENT ILLNESS:  Stephen Lynch a 76 y.o. male with multiple morbidities requiring close monitoring and with high risk of complications and  mortality: metastatic prostate cancer to the bone, type 2 diabetes mellitus.   History of chronic back pain, weakness, COVID-19 viral infection. Patient reports tolerating ongoing chemo well.  History obtained from review of EMR, discussion with primary team, family and/or patient. Records reviewed and summarized above. All 10 point systems reviewed and are negative except as documented in history of present illness above ? ?Review and summarization of Epic records shows history from other than patient.   ? ?Palliative Care was asked to follow this patient o help address complex  decision making in the context of advance care planning and goals of care clarification. I reviewed, as needed, available labs, patient records, imaging, studies and related documents from the EMR.   ? ? ?PERTINENT MEDICATIONS:  ?Outpatient Encounter Medications as of 04/19/2021  ?Medication Sig  ? ASPIRIN 81 PO Take 81 mg by mouth daily.   ? B-D ULTRAFINE III SHORT PEN 31G X 8 MM MISC Inject into the skin 3 (three) times daily.  ? Calcium-Magnesium-Zinc 333-133-5 MG TABS Take 1 tablet by mouth daily.  ? Chromium 1 MG CAPS Take by mouth.  ? Continuous Blood Gluc Sensor (FREESTYLE LIBRE 2 SENSOR) MISC APPLY EVERY 14 DAYS, CHECK GLUCOSE BEFORE EACH MEAL, 2 HRS AFTER, IN THE MORNING AND BEFORE BEDTIME  ? DULoxetine (CYMBALTA) 60 MG capsule Take 60 mg by mouth daily. (Patient not taking: Reported on 12/22/2020)  ? HUMALOG KWIKPEN 100 UNIT/ML KwikPen 20 Units in the morning, at noon, and at bedtime. 14units before breakfast, 5 units before lunch, and 14 units before dinner  ? insulin lispro (HUMALOG) 100 UNIT/ML KwikPen Junior Inject 0.2 mLs (20 Units total) into the skin 3 (three) times daily after meals. (Patient taking differently: Inject 7-25 Units into the skin 3 (three) times daily after meals. Per sliding scale)  ? lidocaine (LIDODERM) 5 % Place 2 patches onto the skin daily. Remove & Discard patch within 12 hours or as directed by MD  ? lidocaine-prilocaine (EMLA) cream Apply 1 application topically as needed.  ? loperamide (IMODIUM) 1 MG/5ML solution Take 2 mg by mouth daily as needed for diarrhea or loose stools.  ? magnesium 30 MG tablet Take 30 mg by mouth 2 (two) times daily. (Patient not taking: Reported on 02/15/2021)  ? Menthol-Camphor (TIGER BALM ARTHRITIS RUB EX) Apply 1 application topically 2 (two) times daily as needed (back pain).  ? ondansetron (ZOFRAN) 4 MG tablet Take 1 tablet (4 mg total) by mouth every 6 (six) hours as  needed. for nausea  ? Oxycodone HCl 10 MG TABS Take 1 tablet (10 mg total) by mouth every 4 (four) hours as needed.  ? prochlorperazine (COMPAZINE) 10 MG tablet Take 1 tablet (10 mg total) by mouth every 6 (six) hours as needed.  ? rosuvastatin (CRESTOR) 10 MG tablet Take 1 tablet (10 mg total) by mouth daily. (Patient taking differently: Take 10 mg by mouth every evening.)  ? TRESIBA FLEXTOUCH 100 UNIT/ML FlexTouch Pen INJECT 36 UNITS INTO THE SKIN AT BEDTIME.  ? ?No facility-administered encounter medications on file as of 04/19/2021.  ? ? ?HOSPICE ELIGIBILITY/DIAGNOSIS: TBD ? ?PAST MEDICAL HISTORY:  ?Past Medical History:  ?Diagnosis Date  ? Cancer [facility])   ? prostate  ? Diabetes mellitus without complication ([place])   ? Dyslipidemia   ? Hypertension   ?  ? ? ?ALLERGIES:  ?Allergies  ?Allergen Reactions  ? Chlorhexidine   ?   ? ?Thank you for the opportunity to participate in the care of Stephen Lynch Please call our office at [phone] if we can be of additional assistance. ? ?Note: Portions of this note were generated with L[employer]. Dictation errors may occur despite best attempts at proofreading. ? ?Teodoro Spray, NP ? ?  ?

## 2021-04-20 DIAGNOSIS — Z794 Long term (current) use of insulin: Secondary | ICD-10-CM | POA: Diagnosis not present

## 2021-04-20 DIAGNOSIS — R54 Age-related physical debility: Secondary | ICD-10-CM | POA: Diagnosis not present

## 2021-04-20 DIAGNOSIS — C7931 Secondary malignant neoplasm of brain: Secondary | ICD-10-CM | POA: Diagnosis not present

## 2021-04-20 DIAGNOSIS — C61 Malignant neoplasm of prostate: Secondary | ICD-10-CM | POA: Diagnosis not present

## 2021-04-20 DIAGNOSIS — E119 Type 2 diabetes mellitus without complications: Secondary | ICD-10-CM | POA: Diagnosis not present

## 2021-04-20 DIAGNOSIS — R110 Nausea: Secondary | ICD-10-CM | POA: Diagnosis not present

## 2021-04-20 DIAGNOSIS — M549 Dorsalgia, unspecified: Secondary | ICD-10-CM | POA: Diagnosis not present

## 2021-04-20 DIAGNOSIS — G8929 Other chronic pain: Secondary | ICD-10-CM | POA: Diagnosis not present

## 2021-04-23 ENCOUNTER — Other Ambulatory Visit (HOSPITAL_COMMUNITY): Payer: Medicare Other

## 2021-05-03 ENCOUNTER — Inpatient Hospital Stay: Payer: Medicare Other

## 2021-05-03 ENCOUNTER — Inpatient Hospital Stay (HOSPITAL_BASED_OUTPATIENT_CLINIC_OR_DEPARTMENT_OTHER): Payer: Medicare Other | Admitting: Oncology

## 2021-05-03 ENCOUNTER — Telehealth: Payer: Self-pay | Admitting: Oncology

## 2021-05-03 ENCOUNTER — Other Ambulatory Visit: Payer: Self-pay

## 2021-05-03 DIAGNOSIS — Z192 Hormone resistant malignancy status: Secondary | ICD-10-CM | POA: Diagnosis not present

## 2021-05-03 DIAGNOSIS — C61 Malignant neoplasm of prostate: Secondary | ICD-10-CM | POA: Diagnosis not present

## 2021-05-03 DIAGNOSIS — Z5189 Encounter for other specified aftercare: Secondary | ICD-10-CM | POA: Diagnosis not present

## 2021-05-03 DIAGNOSIS — D649 Anemia, unspecified: Secondary | ICD-10-CM | POA: Diagnosis not present

## 2021-05-03 DIAGNOSIS — Z5111 Encounter for antineoplastic chemotherapy: Secondary | ICD-10-CM | POA: Diagnosis not present

## 2021-05-03 DIAGNOSIS — M898X9 Other specified disorders of bone, unspecified site: Secondary | ICD-10-CM | POA: Diagnosis not present

## 2021-05-03 DIAGNOSIS — Z95828 Presence of other vascular implants and grafts: Secondary | ICD-10-CM

## 2021-05-03 LAB — CBC WITH DIFFERENTIAL (CANCER CENTER ONLY)
Abs Immature Granulocytes: 0.06 10*3/uL (ref 0.00–0.07)
Basophils Absolute: 0 10*3/uL (ref 0.0–0.1)
Basophils Relative: 0 %
Eosinophils Absolute: 0 10*3/uL (ref 0.0–0.5)
Eosinophils Relative: 0 %
HCT: 26.6 % — ABNORMAL LOW (ref 39.0–52.0)
Hemoglobin: 8.8 g/dL — ABNORMAL LOW (ref 13.0–17.0)
Immature Granulocytes: 1 %
Lymphocytes Relative: 5 %
Lymphs Abs: 0.4 10*3/uL — ABNORMAL LOW (ref 0.7–4.0)
MCH: 29.2 pg (ref 26.0–34.0)
MCHC: 33.1 g/dL (ref 30.0–36.0)
MCV: 88.4 fL (ref 80.0–100.0)
Monocytes Absolute: 0.8 10*3/uL (ref 0.1–1.0)
Monocytes Relative: 10 %
Neutro Abs: 6.2 10*3/uL (ref 1.7–7.7)
Neutrophils Relative %: 84 %
Platelet Count: 197 10*3/uL (ref 150–400)
RBC: 3.01 MIL/uL — ABNORMAL LOW (ref 4.22–5.81)
RDW: 20.7 % — ABNORMAL HIGH (ref 11.5–15.5)
WBC Count: 7.4 10*3/uL (ref 4.0–10.5)
nRBC: 0 % (ref 0.0–0.2)

## 2021-05-03 LAB — CMP (CANCER CENTER ONLY)
ALT: 19 U/L (ref 0–44)
AST: 62 U/L — ABNORMAL HIGH (ref 15–41)
Albumin: 2.9 g/dL — ABNORMAL LOW (ref 3.5–5.0)
Alkaline Phosphatase: 393 U/L — ABNORMAL HIGH (ref 38–126)
Anion gap: 7 (ref 5–15)
BUN: 11 mg/dL (ref 8–23)
CO2: 27 mmol/L (ref 22–32)
Calcium: 8.2 mg/dL — ABNORMAL LOW (ref 8.9–10.3)
Chloride: 101 mmol/L (ref 98–111)
Creatinine: 0.7 mg/dL (ref 0.61–1.24)
GFR, Estimated: >60 mL/min (ref >60)
Glucose, Bld: 178 mg/dL — ABNORMAL HIGH (ref 70–99)
Potassium: 3.7 mmol/L (ref 3.5–5.1)
Sodium: 135 mmol/L (ref 135–145)
Total Bilirubin: 1 mg/dL (ref 0.3–1.2)
Total Protein: 6.3 g/dL — ABNORMAL LOW (ref 6.5–8.1)

## 2021-05-03 LAB — SAMPLE TO BLOOD BANK

## 2021-05-03 MED ORDER — ONDANSETRON HCL 4 MG PO TABS: 4.0000 mg | ORAL_TABLET | Freq: Four times a day (QID) | ORAL | 3 refills | Status: AC | PRN

## 2021-05-03 MED ORDER — SODIUM CHLORIDE 0.9% FLUSH
10.0000 mL | INTRAVENOUS | Status: AC | PRN
  Administered 2021-05-03: 10 mL

## 2021-05-03 MED ORDER — HEPARIN SOD (PORK) LOCK FLUSH 100 UNIT/ML IV SOLN
500.0000 [IU] | INTRAVENOUS | Status: AC | PRN
  Administered 2021-05-03: 500 [IU]

## 2021-05-03 NOTE — Telephone Encounter (Signed)
Scheduled per providers request, patient has been called and notified of upcoming appointments. ?

## 2021-05-03 NOTE — Progress Notes (Signed)
Hematology and Oncology Follow Up Visit ? ?Stephen Lynch ?[id number] ?[birth year] 76 y.o. ?05/03/2021 11:02 AM ?[facility]  ? 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  ? ?Prior Therapy: ? ?He status post CT-guided biopsy of the right iliac bone. ? ?[name] started on April 20, 2019. ? ?Xtandi 160 mg daily started in April 2021.  Therapy discontinued in March 2022 due to progression of disease. ? ?Zytiga 1000 mg daily with prednisone 5 mg daily started in March 2022. ? ?Taxotere chemotherapy 75 mg per metered square started on June 08, 2020.  He is here for cycle 9 of therapy. ? ?Radiation therapy to L3 and L4 bilateral shoulders completed in February 2023.  He received 30 Gray in 10 fractions. ? ?Current therapy:  ? ? Eligard 30 mg started in April 2021.  Next Eligard will be given in April 2023. ? ? ?Jevtana chemotherapy 20 mg per metered square every 3 weeks started on December 28, 2020.  He is here for cycle 7 of therapy. ? ? ? ? ?Interim History: Mr. Card returns today for repeat evaluation.  Since the last visit, he reports no major changes in his health.  He has reported more fatigue, tiredness although bone pain has been stable.  He has lost some more weight.  He is still ambulatory with the help of a cane without any falls or syncope.  His performance status quality of life remains unchanged. ? ?Medications: Updated on review. ?Current Outpatient Medications  ?Medication Sig Dispense Refill  ? ASPIRIN 81 PO Take 81 mg by mouth daily.     ? B-D ULTRAFINE III SHORT PEN 31G X 8 MM MISC Inject into the skin 3 (three) times daily.    ? Calcium-Magnesium-Zinc 333-133-5 MG TABS Take 1 tablet by mouth daily.    ? Chromium 1 MG CAPS Take by mouth.    ? Continuous Blood Gluc Sensor (FREESTYLE LIBRE 2 SENSOR) MISC APPLY EVERY 14 DAYS, CHECK GLUCOSE BEFORE EACH MEAL, 2 HRS AFTER,  IN THE MORNING AND BEFORE BEDTIME 2 each 2  ? DULoxetine (CYMBALTA) 60 MG capsule Take 60 mg by mouth daily. (Patient not taking: Reported on 12/22/2020)    ? HUMALOG KWIKPEN 100 UNIT/ML KwikPen 20 Units in the morning, at noon, and at bedtime. 14units before breakfast, 5 units before lunch, and 14 units before dinner    ? insulin lispro (HUMALOG) 100 UNIT/ML KwikPen Junior Inject 0.2 mLs (20 Units total) into the skin 3 (three) times daily after meals. (Patient taking differently: Inject 7-25 Units into the skin 3 (three) times daily after meals. Per sliding scale) 3 mL 6  ? lidocaine (LIDODERM) 5 % Place 2 patches onto the skin daily. Remove & Discard patch within 12 hours or as directed by MD 30 patch 0  ? lidocaine-prilocaine (EMLA) cream Apply 1 application topically as needed. 30 g 0  ? loperamide (IMODIUM) 1 MG/5ML solution Take 2 mg by mouth daily as needed for diarrhea or loose stools.    ? magnesium 30 MG tablet Take 30 mg by mouth 2 (two) times daily. (Patient not taking: Reported on 02/15/2021)    ? Menthol-Camphor (TIGER BALM ARTHRITIS RUB EX) Apply 1 application topically 2 (two) times daily as needed (back pain).    ? ondansetron (ZOFRAN) 4 MG tablet Take 1 tablet (4 mg total) by mouth every 6 (six) hours as needed. for nausea 20 tablet 3  ?  Oxycodone HCl 10 MG TABS Take 1 tablet (10 mg total) by mouth every 4 (four) hours as needed. 90 tablet 0  ? prochlorperazine (COMPAZINE) 10 MG tablet Take 1 tablet (10 mg total) by mouth every 6 (six) hours as needed. 30 tablet 3  ? rosuvastatin (CRESTOR) 10 MG tablet Take 1 tablet (10 mg total) by mouth daily. (Patient taking differently: Take 10 mg by mouth every evening.) 90 tablet 3  ? TRESIBA FLEXTOUCH 100 UNIT/ML FlexTouch Pen INJECT 36 UNITS INTO THE SKIN AT BEDTIME. 15 mL 3  ? ?No current facility-administered medications for this visit.  ? ? ? ?Allergies:  ?Allergies  ?Allergen Reactions  ? Chlorhexidine   ? ? ? ? ?Physical Exam: ? ? ? ?Blood pressure (!)  147/74, pulse 94, temperature 98 ?F (36.7 ?C), temperature source Axillary, resp. rate 17, height '6\' 1"'$  (1.854 m), weight 200 lb 11.2 oz (91 kg), SpO2 100 %. ? ? ? ?ECOG: 2 ?  ? ?General appearance: Alert, awake without any distress. ?Head: Atraumatic without abnormalities ?Oropharynx: Without any thrush or ulcers. ?Eyes: No scleral icterus. ?Lymph nodes: No lymphadenopathy noted in the cervical, supraclavicular, or axillary nodes ?Heart:regular rate and rhythm, without any murmurs or gallops.   ?Lung: Clear to auscultation without any rhonchi, wheezes or dullness to percussion. ?Abdomin: Soft, nontender without any shifting dullness or ascites. ?Musculoskeletal: No clubbing or cyanosis. ?Neurological: No motor or sensory deficits. ?Skin: No rashes or lesions. ? ? ? ? ? ? ? ? ? ? ? ? ? ? ? ? ? ? ?Lab Results: ?Lab Results  ?Component Value Date  ? WBC 8.0 04/11/2021  ? HGB 9.0 (L) 04/11/2021  ? HCT 27.8 (L) 04/11/2021  ? MCV 93.9 04/11/2021  ? PLT 157 04/11/2021  ? ?  Chemistry   ?   ?Component Value Date/Time  ? NA 135 04/11/2021 1111  ? K 4.4 04/11/2021 1111  ? CL 101 04/11/2021 1111  ? CO2 26 04/11/2021 1111  ? BUN 12 04/11/2021 1111  ? CREATININE 0.74 04/11/2021 1111  ?    ?Component Value Date/Time  ? CALCIUM 8.6 (L) 04/11/2021 1111  ? ALKPHOS 505 (H) 04/11/2021 1111  ? AST 43 (H) 04/11/2021 1111  ? ALT 16 04/11/2021 1111  ? BILITOT 0.8 04/11/2021 1111  ?  ? ? ? ? Latest Reference Range & Units 03/01/21 08:17 03/22/21 09:44 04/11/21 11:11  ?Prostate Specific Ag, Serum 0.0 - 4.0 ng/mL 463.0 (H) 490.0 (H) 241.0 (H)  ?(H): Data is abnormally high ? ? ? ?Impression and Plan: ? ? ?76 year old man with: ? ?1.  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   ? ?Risks and benefits of continuing Jevtana chemotherapy were reviewed at this time.  Complications that include nausea, vomiting, myelosuppression, neutropenia possible sepsis were reiterated.  Alternative  treatment options including Pluvicto will be deferred at this time.  Transitioning to palliative care and hospice is also a possibility if he developed worsening disease.  He is agreeable to continue at this time. ? ? ?2.  Androgen deprivation: Next injection will be in April 2023.  This will be continued indefinitely. ?  ?3.  Growth factor support: He will require growth factor support after each cycle of therapy.  He is at risk of neutropenia and possible sepsis. ? ? ?4.  Prognosis and goals of care: His disease is incurable although aggressive measures are warranted at this time.  If he experiences more clinical decline transition to  a palliative care only and hospice would be recommended. ? ? ?5.  Anemia: Related to malignancy with hemoglobin stable at this time.  No transfusion is needed. ? ?6.  Bone pain: Manageable with oxycodone. ? ? ?7.  IV access: Port-A-Cath remains in use without any issues. ? ?8. Follow-up: will return in 3 weeks for a follow-up visit. ? ?  ?30  minutes were spent on this visit.  The time was dedicated to reviewing laboratory data, disease status update and outlining future plan of care discussion. ? ?Zola Button, MD ?3/23/202311:02 AM ? ?

## 2021-05-04 ENCOUNTER — Inpatient Hospital Stay: Payer: Medicare Other

## 2021-05-04 ENCOUNTER — Other Ambulatory Visit: Payer: Self-pay

## 2021-05-04 VITALS — BP 131/69 | HR 100 | Temp 98.8°F | Resp 18

## 2021-05-04 DIAGNOSIS — C61 Malignant neoplasm of prostate: Secondary | ICD-10-CM | POA: Diagnosis not present

## 2021-05-04 DIAGNOSIS — Z5189 Encounter for other specified aftercare: Secondary | ICD-10-CM | POA: Diagnosis not present

## 2021-05-04 DIAGNOSIS — Z192 Hormone resistant malignancy status: Secondary | ICD-10-CM | POA: Diagnosis not present

## 2021-05-04 DIAGNOSIS — M898X9 Other specified disorders of bone, unspecified site: Secondary | ICD-10-CM | POA: Diagnosis not present

## 2021-05-04 DIAGNOSIS — D649 Anemia, unspecified: Secondary | ICD-10-CM | POA: Diagnosis not present

## 2021-05-04 DIAGNOSIS — Z5111 Encounter for antineoplastic chemotherapy: Secondary | ICD-10-CM | POA: Diagnosis not present

## 2021-05-04 MED ORDER — DIPHENHYDRAMINE HCL 50 MG/ML IJ SOLN
25.0000 mg | Freq: Once | INTRAMUSCULAR | Status: AC
  Administered 2021-05-04: 25 mg via INTRAVENOUS
  Filled 2021-05-04: qty 1

## 2021-05-04 MED ORDER — SODIUM CHLORIDE 0.9 % IV SOLN: Freq: Once | INTRAVENOUS | Status: AC

## 2021-05-04 MED ORDER — SODIUM CHLORIDE 0.9 % IV SOLN
10.0000 mg | Freq: Once | INTRAVENOUS | Status: AC
  Administered 2021-05-04: 10 mg via INTRAVENOUS
  Filled 2021-05-04: qty 10

## 2021-05-04 MED ORDER — SODIUM CHLORIDE 0.9 % IV SOLN
20.0000 mg/m2 | Freq: Once | INTRAVENOUS | Status: AC
  Administered 2021-05-04: 44 mg via INTRAVENOUS
  Filled 2021-05-04: qty 4.4

## 2021-05-04 MED ORDER — FAMOTIDINE IN NACL 20-0.9 MG/50ML-% IV SOLN
20.0000 mg | Freq: Once | INTRAVENOUS | Status: AC
  Administered 2021-05-04: 20 mg via INTRAVENOUS
  Filled 2021-05-04: qty 50

## 2021-05-04 MED ORDER — HEPARIN SOD (PORK) LOCK FLUSH 100 UNIT/ML IV SOLN
500.0000 [IU] | Freq: Once | INTRAVENOUS | Status: AC | PRN
  Administered 2021-05-04: 500 [IU]

## 2021-05-04 MED ORDER — SODIUM CHLORIDE 0.9% FLUSH
10.0000 mL | INTRAVENOUS | Status: DC | PRN
  Administered 2021-05-04: 10 mL

## 2021-05-04 NOTE — Patient Instructions (Addendum)
[facility]  Discharge Instructions: ?Thank you for choosing [facility] to provide your oncology and hematology care.  ? ?If you have a lab appointment with the [facility], please go directly to the [facility] and check in at the registration area. ?  ?Wear comfortable clothing and clothing appropriate for easy access to any Portacath or PICC line.  ? ?We strive to give you quality time with your provider. You may need to reschedule your appointment if you arrive late (15 or more minutes).  Arriving late affects you and other patients whose appointments are after yours.  Also, if you miss three or more appointments without notifying the office, you may be dismissed from the clinic at the provider?s discretion.    ?  ?For prescription refill requests, have your pharmacy contact our office and allow 72 hours for refills to be completed.   ? ?Today you received the following chemotherapy and/or immunotherapy agents: Jevtana    ?  ?To help prevent nausea and vomiting after your treatment, we encourage you to take your nausea medication as directed. ? ?BELOW ARE SYMPTOMS THAT SHOULD BE REPORTED IMMEDIATELY: ?*FEVER GREATER THAN 100.4 F (38 ?C) OR HIGHER ?*CHILLS OR SWEATING ?*NAUSEA AND VOMITING THAT IS NOT CONTROLLED WITH YOUR NAUSEA MEDICATION ?*UNUSUAL SHORTNESS OF BREATH ?*UNUSUAL BRUISING OR BLEEDING ?*URINARY PROBLEMS (pain or burning when urinating, or frequent urination) ?*BOWEL PROBLEMS (unusual diarrhea, constipation, pain near the anus) ?TENDERNESS IN MOUTH AND THROAT WITH OR WITHOUT PRESENCE OF ULCERS (sore throat, sores in mouth, or a toothache) ?UNUSUAL RASH, SWELLING OR PAIN  ?UNUSUAL VAGINAL DISCHARGE OR ITCHING  ? ?Items with * indicate a potential emergency and should be followed up as soon as possible or go to the Emergency Department if any problems should occur. ? ?Please show the CHEMOTHERAPY ALERT CARD or IMMUNOTHERAPY ALERT CARD at check-in to  the Emergency Department and triage nurse. ? ?Should you have questions after your visit or need to cancel or reschedule your appointment, please contact [facility]  Dept: [phone]  and follow the prompts.  Office hours are 8:00 a.m. to 4:30 p.m. Monday - Friday. Please note that voicemails left after 4:00 p.m. may not be returned until the following business day.  We are closed weekends and major holidays. You have access to a nurse at all times for urgent questions. Please call the main number to the clinic Dept: [phone] and follow the prompts. ? ? ?For any non-urgent questions, you may also contact your provider using MyChart. We now offer e-Visits for anyone 66 and older to request care online for non-urgent symptoms. For details visit [email or web address]. ?  ?Also download the MyChart app! Go to the app store, search "MyChart", open the app, select [facility], and log in with your MyChart username and password. ? ?Due to Covid, a mask is required upon entering the hospital/clinic. If you do not have a mask, one will be given to you upon arrival. For doctor visits, patients may have 1 support person aged 43 or older with them. For treatment visits, patients cannot have anyone with them due to current Covid guidelines and our immunocompromised population.  ? ?

## 2021-05-05 ENCOUNTER — Inpatient Hospital Stay: Payer: Medicare Other

## 2021-05-07 ENCOUNTER — Inpatient Hospital Stay: Payer: Medicare Other

## 2021-05-07 ENCOUNTER — Other Ambulatory Visit: Payer: Self-pay

## 2021-05-07 VITALS — BP 137/65 | HR 99 | Temp 98.3°F | Resp 18

## 2021-05-07 DIAGNOSIS — C61 Malignant neoplasm of prostate: Secondary | ICD-10-CM

## 2021-05-07 DIAGNOSIS — D649 Anemia, unspecified: Secondary | ICD-10-CM | POA: Diagnosis not present

## 2021-05-07 DIAGNOSIS — M898X9 Other specified disorders of bone, unspecified site: Secondary | ICD-10-CM | POA: Diagnosis not present

## 2021-05-07 DIAGNOSIS — Z5189 Encounter for other specified aftercare: Secondary | ICD-10-CM | POA: Diagnosis not present

## 2021-05-07 DIAGNOSIS — Z5111 Encounter for antineoplastic chemotherapy: Secondary | ICD-10-CM | POA: Diagnosis not present

## 2021-05-07 DIAGNOSIS — Z192 Hormone resistant malignancy status: Secondary | ICD-10-CM | POA: Diagnosis not present

## 2021-05-07 LAB — PROSTATE-SPECIFIC AG, SERUM (LABCORP): Prostate Specific Ag, Serum: 365 ng/mL — ABNORMAL HIGH (ref 0.0–4.0)

## 2021-05-07 MED ORDER — PEGFILGRASTIM-CBQV 6 MG/0.6ML ~~LOC~~ SOSY
6.0000 mg | PREFILLED_SYRINGE | Freq: Once | SUBCUTANEOUS | Status: AC
  Administered 2021-05-07: 6 mg via SUBCUTANEOUS
  Filled 2021-05-07: qty 0.6

## 2021-05-07 MED ORDER — LEUPROLIDE ACETATE (4 MONTH) 30 MG ~~LOC~~ KIT
30.0000 mg | PACK | Freq: Once | SUBCUTANEOUS | Status: AC
  Administered 2021-05-07: 30 mg via SUBCUTANEOUS
  Filled 2021-05-07: qty 30

## 2021-05-08 ENCOUNTER — Encounter: Payer: Self-pay | Admitting: Urology

## 2021-05-08 NOTE — Progress Notes (Signed)
Telephone follow-up. I spoke w/ significant other Stephen Lynch, verified her identity and began nursing interview. She reports patient is having bilateral pelvic pain 6/10. No other issues reported at this time. ? ?Meaningful use complete. ? ?Reminded Ms. Stephen Lynch of [name] 11:30am-05/09/21 telephone appointment w/ Freeman Caldron PA-C. I left my extension in case patient needs anything. Ms. Stephen Lynch expressed verbal understanding of information given.  ? ?Patient contact [phone]- Stephen Lynch ?

## 2021-05-09 ENCOUNTER — Ambulatory Visit
Admission: RE | Admit: 2021-05-09 | Discharge: 2021-05-09 | Disposition: A | Discharge status: Home / Self Care | Payer: Medicare Other | Source: Ambulatory Visit | Attending: Urology | Admitting: Urology

## 2021-05-09 DIAGNOSIS — C61 Malignant neoplasm of prostate: Secondary | ICD-10-CM

## 2021-05-09 NOTE — Progress Notes (Signed)
?Radiation Oncology         [phone]) [phone] ?________________________________ ? ?Name: Stephen Lynch MRN: [id number]  ?Date: 05/09/2021  DOB: 10-01-1945 ? ?Post Treatment Note ? ?CC: [facility]  [place], Mathis Dad, MD ? ?Diagnosis:   76 y.o. 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 ? ?Interval Since Last Radiation:  4 weeks  ?03/28/21 - 04/10/21:  The painful sites of metastatic disease in the lumbar spine (L2-L5) and bilateral shoulders were treated to 30 Gy in 10 fractions of 3 Gy each. ? ?Narrative:  I spoke with the patient to conduct his routine scheduled 1 month follow up visit via telephone to spare the patient unnecessary potential exposure in the healthcare setting during the current COVID-19 pandemic.  The patient was notified in advance and gave permission to proceed with this visit format. ? ?He tolerated radiation treatment relatively well with only mild fatigue.  He did report improvement in his pain throughout the course of radiation.                             ? ?On review of systems, the patient states that he is doing well in general.  The low back pain and shoulder pain has significantly improved which he is quite pleased with.  He does continue with some mild left hip pain which he rates at 3 out of 10 on the pain scale.  The pain is exacerbated with weightbearing activities but does respond to pain medication as needed.  He continues to tolerate the Jevtana chemotherapy and Eligard ADT fairly well aside from fatigue/generalized weakness and decreased appetite.  But, overall, he is pleased with his progress to date. ? ?ALLERGIES:  is allergic to chlorhexidine. ? ?Meds: ?Current Outpatient Medications  ?Medication Sig Dispense Refill  ? ASPIRIN 81 PO Take 81 mg by mouth daily.     ? B-D ULTRAFINE III SHORT PEN 31G X 8 MM MISC Inject into the skin 3 (three) times daily.    ? Calcium-Magnesium-Zinc  333-133-5 MG TABS Take 1 tablet by mouth daily.    ? Chromium 1 MG CAPS Take by mouth.    ? Continuous Blood Gluc Sensor (FREESTYLE LIBRE 2 SENSOR) MISC APPLY EVERY 14 DAYS, CHECK GLUCOSE BEFORE EACH MEAL, 2 HRS AFTER, IN THE MORNING AND BEFORE BEDTIME 2 each 2  ? DULoxetine (CYMBALTA) 60 MG capsule Take 60 mg by mouth daily. (Patient not taking: Reported on 12/22/2020)    ? HUMALOG KWIKPEN 100 UNIT/ML KwikPen 20 Units in the morning, at noon, and at bedtime. 14units before breakfast, 5 units before lunch, and 14 units before dinner    ? insulin lispro (HUMALOG) 100 UNIT/ML KwikPen Junior Inject 0.2 mLs (20 Units total) into the skin 3 (three) times daily after meals. (Patient taking differently: Inject 7-25 Units into the skin 3 (three) times daily after meals. Per sliding scale) 3 mL 6  ? lidocaine (LIDODERM) 5 % Place 2 patches onto the skin daily. Remove & Discard patch within 12 hours or as directed by MD 30 patch 0  ? lidocaine-prilocaine (EMLA) cream Apply 1 application topically as needed. 30 g 0  ? loperamide (IMODIUM) 1 MG/5ML solution Take 2 mg by mouth daily as needed for diarrhea or loose stools.    ? magnesium 30 MG tablet Take 30 mg by mouth 2 (two) times daily. (Patient not taking: Reported on 02/15/2021)    ?  Menthol-Camphor (TIGER BALM ARTHRITIS RUB EX) Apply 1 application topically 2 (two) times daily as needed (back pain).    ? ondansetron (ZOFRAN) 4 MG tablet Take 1 tablet (4 mg total) by mouth every 6 (six) hours as needed. for nausea 20 tablet 3  ? Oxycodone HCl 10 MG TABS Take 1 tablet (10 mg total) by mouth every 4 (four) hours as needed. 90 tablet 0  ? prochlorperazine (COMPAZINE) 10 MG tablet Take 1 tablet (10 mg total) by mouth every 6 (six) hours as needed. 30 tablet 3  ? rosuvastatin (CRESTOR) 10 MG tablet Take 1 tablet (10 mg total) by mouth daily. (Patient taking differently: Take 10 mg by mouth every evening.) 90 tablet 3  ? TRESIBA FLEXTOUCH 100 UNIT/ML FlexTouch Pen INJECT 36 UNITS  INTO THE SKIN AT BEDTIME. 15 mL 3  ? ?No current facility-administered medications for this encounter.  ? ? ?Physical Findings: ? vitals were not taken for this visit.  ?Pain Assessment ?Pain Score: 6  (Pelvic pain)/10 ?Unable to assess due to telephone follow-up visit format. ? ?Lab Findings: ?Lab Results  ?Component Value Date  ? WBC 7.4 05/03/2021  ? HGB 8.8 (L) 05/03/2021  ? HCT 26.6 (L) 05/03/2021  ? MCV 88.4 05/03/2021  ? PLT 197 05/03/2021  ? ? ? ?Radiographic Findings: ?No results found. ? ?Impression/Plan: ?1. 76 y.o. 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 ?He appears to have recovered well from the effects of his recent palliative radiotherapy to the lumbar spine and shoulders and is currently without complaints aside from mild left hip pain.  He will continue to monitor this and we will follow his progress via correspondence with Dr. Alen Blew.  If the pain progresses or becomes poorly controlled with pain medications, we are more than happy to offer palliative radiation to the left hip in the future.  Otherwise, we will plan to see him back on an as-needed basis.  He will continue in routine follow-up under the care and direction of Dr. Alen Blew for continued management of his systemic disease.  We enjoyed taking care of him and look forward to continuing to follow his progress. ? ? ? ?Stephen Johns, PA-C  ?

## 2021-05-14 ENCOUNTER — Other Ambulatory Visit (INDEPENDENT_AMBULATORY_CARE_PROVIDER_SITE_OTHER): Payer: Medicare Other

## 2021-05-14 DIAGNOSIS — Z794 Long term (current) use of insulin: Secondary | ICD-10-CM

## 2021-05-14 DIAGNOSIS — E1165 Type 2 diabetes mellitus with hyperglycemia: Secondary | ICD-10-CM

## 2021-05-14 LAB — HEMOGLOBIN A1C: Hgb A1c MFr Bld: 8.4 % — ABNORMAL HIGH (ref 4.6–6.5)

## 2021-05-14 LAB — BASIC METABOLIC PANEL
BUN: 6 mg/dL (ref 6–23)
CO2: 24 mEq/L (ref 19–32)
Calcium: 8.3 mg/dL — ABNORMAL LOW (ref 8.4–10.5)
Chloride: 104 mEq/L (ref 96–112)
Creatinine, Ser: 0.83 mg/dL (ref 0.40–1.50)
GFR: 86.36 mL/min (ref >60.00)
Glucose, Bld: 231 mg/dL — ABNORMAL HIGH (ref 70–99)
Potassium: 3.9 mEq/L (ref 3.5–5.1)
Sodium: 137 mEq/L (ref 135–145)

## 2021-05-16 ENCOUNTER — Ambulatory Visit (INDEPENDENT_AMBULATORY_CARE_PROVIDER_SITE_OTHER): Payer: Medicare Other | Admitting: Endocrinology

## 2021-05-16 ENCOUNTER — Encounter: Payer: Self-pay | Admitting: Endocrinology

## 2021-05-16 VITALS — BP 128/76 | HR 93 | Ht 73.0 in | Wt 193.6 lb

## 2021-05-16 DIAGNOSIS — E1165 Type 2 diabetes mellitus with hyperglycemia: Secondary | ICD-10-CM

## 2021-05-16 DIAGNOSIS — Z794 Long term (current) use of insulin: Secondary | ICD-10-CM

## 2021-05-16 NOTE — Progress Notes (Signed)
Patient ID: Stephen Lynch, male   DOB: Jun 12, 1945, 76 y.o.   MRN: [id number] ? ?       ? ? ?Reason for Appointment: Endocrinology follow-up  ? ?PCP: Dr. Criss Rosales ? ? ?History of Present Illness:  ?        ?Date of diagnosis of type 2 diabetes mellitus: 2007?      ? ?Background history:  ? ?He has been on insulin since about the time of diagnosis.  He thinks initially oral agents that were prescribed did not work ?No detailed history of his previous treatment is available and A1c from last year was over 12% ? ?Recent history:  ? ?Most recent A1c is 8.4 compared to 6%  ? ?INSULIN regimen is: [place] 36 U at bedtime, Humalog 14-5-14 units before meals ? ?Non-insulin hypoglycemic drugs the patient is taking are: None ? ?Current management, blood sugar patterns and problems identified: ? He has lost another 7 pounds with his decreased appetite and management of prostate cancer  ?However his blood sugars are looking poorly controlled again ?He could not find his freestyle libre reader this morning and not clear what his blood sugar patterns are ?He is a very poor historian and some of the history is obtained from his wife ?Although previously when he was eating much less his Humalog was reduced he appears to be having significantly high readings all the time although apparently not consistently after dinner ?Unclear also whether he is having consistently high readings over 200 in the morning, lab glucose was 231 fasting ?Also appears to be possibly missing his dose of Tresiba at night sometimes ?Now appears to be getting periodic chemotherapy with dexamethasone 10 mg infusion and he vaguely thinks that his blood sugars go up when he is taking chemotherapy but did not let us know ?He is generally eating smaller meals in the mornings and lunchtime ?Previously had not taken [place] because of cost ? ? ?Typical meal intake: Breakfast is usually cereal and almond milk or grits and eggs.  Lunch fruits, tuna or other seafood dinner  is rice, vegetables and protein.                ?Snacks are nuts, fruits, ice cream, chocolate ? ?Exercise: minimal, limited by weakness ? ?Glucose monitoring:  done 1-2 times a day   ?Blood sugars by recall: ? ? ?PRE-MEAL Fasting Lunch Dinner Bedtime Overall  ?Glucose range:       ?Mean/median: 200 180 200 150--250   ? ?POST-MEAL PC Breakfast PC Lunch PC Dinner  ?Glucose range:     ?Mean/median:     ? ?PREVIOUS data: ? ?Overall blood sugars are on an average in the upper end of the target range with moderate fluctuation  ?No consistent pattern is seen since blood sugar levels show sporadic episodes of HYPERGLYCEMIA at all different times including overnight  ?His best blood sugars are on 1/1 when they were fairly consistently within the target range at all times  ?Overnight blood sugars are mostly high averaging at least 150 with some variability and occasional significant high readings usually starting before bedtime  ?Hypoglycemia has been minimal with only 1 episode at 2 AM preceded by a high blood sugar ?POSTPRANDIAL readings are in general ?Consistently however he has sporadic spikes after lunch or dinner as well as occasionally in ? ?Highest blood sugars overall were on 12/30 ? ?CGM use % of time 100  ?2-week average/GV 168  ?Time in range 63  ?% Time Above 180  27  ?% Time above 250 9  ?% Time Below 70 1  ? ?  ?PRE-MEAL Fasting Lunch Dinner Bedtime Overall  ?Glucose range:       ?Averages: 159 155 166    ? ?POST-MEAL PC Breakfast PC Lunch PC Dinner  ?Glucose range:     ?Averages: 182 159 197  ? ? ? ?Dietician visit, most recent: never ? ?Weight history: ? ?Wt Readings from Last 3 Encounters:  ?05/16/21 193 lb 9.6 oz (87.8 kg)  ?05/03/21 200 lb 11.2 oz (91 kg)  ?04/11/21 207 lb 9.6 oz (94.2 kg)  ? ? ?Glycemic control: ?  ?Lab Results  ?Component Value Date  ? HGBA1C 8.4 (H) 05/14/2021  ? HGBA1C 6.0 02/13/2021  ? HGBA1C 8.5 (H) 08/18/2020  ? ?Lab Results  ?Component Value Date  ? [place] 72 07/18/2020  ?  CREATININE 0.83 05/14/2021  ? ?No results found for: MICRALBCREAT ? ?Lab Results  ?Component Value Date  ? FRUCTOSAMINE 290 (H) 02/13/2021  ? FRUCTOSAMINE 282 09/29/2020  ? FRUCTOSAMINE 350 (H) 07/18/2020  ? ? ?Lab on 05/14/2021  ?Component Date Value Ref Range Status  ? Sodium 05/14/2021 137  135 - 145 mEq/L Final  ? Potassium 05/14/2021 3.9  3.5 - 5.1 mEq/L Final  ? Chloride 05/14/2021 104  96 - 112 mEq/L Final  ? CO2 05/14/2021 24  19 - 32 mEq/L Final  ? Glucose, Bld 05/14/2021 231 (H)  70 - 99 mg/dL Final  ? BUN 05/14/2021 6  6 - 23 mg/dL Final  ? Creatinine, Ser 05/14/2021 0.83  0.40 - 1.50 mg/dL Final  ? GFR 05/14/2021 86.36  >60.00 mL/min Final  ? Calculated using the CKD-EPI Creatinine Equation (2021)  ? Calcium 05/14/2021 8.3 (L)  8.4 - 10.5 mg/dL Final  ? Hgb A1c MFr Bld 05/14/2021 8.4 (H)  4.6 - 6.5 % Final  ? Glycemic Control Guidelines for People with Diabetes:Non Diabetic:  <6%Goal of Therapy: <7%Additional Action Suggested:  >8%   ? ? ?Allergies as of 05/16/2021   ? ?   Reactions  ? Chlorhexidine   ? ?  ? ?  ?Medication List  ?  ? ?  ? Accurate as of May 16, 2021  3:08 PM. If you have any questions, ask your nurse or doctor.  ?  ?  ? ?  ? ?ASPIRIN 81 PO ?Take 81 mg by mouth daily. ?  ?B-D ULTRAFINE III SHORT PEN 31G X 8 MM Misc ?Generic drug: Insulin Pen Needle ?Inject into the skin 3 (three) times daily. ?  ?Calcium-Magnesium-Zinc 333-133-5 MG Tabs ?Take 1 tablet by mouth daily. ?  ?Chromium 1 MG Caps ?Take by mouth. ?  ?DULoxetine 60 MG capsule ?Commonly known as: CYMBALTA ?Take 60 mg by mouth daily. ?  ?FreeStyle Libre 2 Sensor Misc ?APPLY EVERY 14 DAYS, CHECK GLUCOSE BEFORE EACH MEAL, 2 HRS AFTER, IN THE MORNING AND BEFORE BEDTIME ?  ?HumaLOG Junior KwikPen 100 UNIT/ML ?Generic drug: Insulin lispro ?Inject 0.2 mLs (20 Units total) into the skin 3 (three) times daily after meals. ?  ?HumaLOG KwikPen 100 UNIT/ML KwikPen ?Generic drug: insulin lispro ?in the morning, at noon, and at bedtime. 14units  before breakfast, 5 units before lunch, and 14 units before dinner ?  ?lidocaine 5 % ?Commonly known as: LIDODERM ?Place 2 patches onto the skin daily. Remove & Discard patch within 12 hours or as directed by MD ?  ?lidocaine-prilocaine cream ?Commonly known as: EMLA ?Apply 1 application topically as needed. ?  ?loperamide  1 MG/5ML solution ?Commonly known as: IMODIUM ?Take 2 mg by mouth daily as needed for diarrhea or loose stools. ?  ?magnesium 30 MG tablet ?Take 30 mg by mouth 2 (two) times daily. ?  ?ondansetron 4 MG tablet ?Commonly known as: ZOFRAN ?Take 1 tablet (4 mg total) by mouth every 6 (six) hours as needed. for nausea ?  ?Oxycodone HCl 10 MG Tabs ?Take 1 tablet (10 mg total) by mouth every 4 (four) hours as needed. ?  ?prochlorperazine 10 MG tablet ?Commonly known as: COMPAZINE ?Take 1 tablet (10 mg total) by mouth every 6 (six) hours as needed. ?  ?rosuvastatin 10 MG tablet ?Commonly known as: CRESTOR ?Take 1 tablet (10 mg total) by mouth daily. ?What changed: when to take this ?  ?TIGER BALM ARTHRITIS RUB EX ?Apply 1 application topically 2 (two) times daily as needed (back pain). ?  ?[name] FlexTouch 100 UNIT/ML FlexTouch Pen ?Generic drug: insulin degludec ?INJECT 36 UNITS INTO THE SKIN AT BEDTIME. ?  ? ?  ? ? ?Allergies:  ?Allergies  ?Allergen Reactions  ? Chlorhexidine   ? ? ?Past Medical History:  ?Diagnosis Date  ? Cancer [facility])   ? prostate  ? Diabetes mellitus without complication ([place])   ? Dyslipidemia   ? Hypertension   ? ? ?Past Surgical History:  ?Procedure Laterality Date  ? EYE SURGERY    ? IR IMAGING GUIDED PORT INSERTION  06/12/2020  ? ? ?Family History  ?Problem Relation Age of Onset  ? CAD Mother 72  ? Other Father 25  ?     age  ? Other Brother 72  ?     unknown cause  ? ? ?Social History:  reports that he has quit smoking. His smoking use included cigarettes. He has a 7.50 pack-year smoking history. He has never used smokeless tobacco. He reports that he does not currently use  alcohol. He reports that he does not currently use drugs. ? ? ?Review of Systems ? ? ?Lipid history: Has been treated with Crestor by PCP ? ?  ?Lab Results  ?Component Value Date  ? CHOL 142 07/18/2020  ? HDL 53.8

## 2021-05-16 NOTE — Patient Instructions (Addendum)
On day of Chemo and next 2 days take 50 U Tresiba ? ?Humalog is 20-24 units at meals on day of chemo and next day ? ?Other days go down to 42 units [place] ? ?Other days take Humalog just after meal range 10-20 units ? ?

## 2021-05-17 ENCOUNTER — Other Ambulatory Visit: Payer: Self-pay | Admitting: *Deleted

## 2021-05-17 NOTE — Patient Outreach (Signed)
[facility]) Care Management ? ?05/17/2021 ? ?[name] ?1945/09/15 ?[id number] ? ? ?Outgoing call placed to member, unsuccessful.  Unable to leave message as mailbox is full.  Will follow up within the next 3-4 business days. ? ?[name], RN, MSN, CCM ?[facility] Care Management  ?Community Care Manager ?[phone] ? ?

## 2021-05-22 ENCOUNTER — Other Ambulatory Visit: Payer: Self-pay | Admitting: *Deleted

## 2021-05-22 DIAGNOSIS — B370 Candidal stomatitis: Secondary | ICD-10-CM | POA: Diagnosis not present

## 2021-05-22 DIAGNOSIS — R110 Nausea: Secondary | ICD-10-CM | POA: Diagnosis not present

## 2021-05-22 DIAGNOSIS — C7951 Secondary malignant neoplasm of bone: Secondary | ICD-10-CM | POA: Diagnosis not present

## 2021-05-22 DIAGNOSIS — C61 Malignant neoplasm of prostate: Secondary | ICD-10-CM | POA: Diagnosis not present

## 2021-05-22 DIAGNOSIS — B3781 Candidal esophagitis: Secondary | ICD-10-CM | POA: Diagnosis not present

## 2021-05-22 DIAGNOSIS — G8929 Other chronic pain: Secondary | ICD-10-CM | POA: Diagnosis not present

## 2021-05-22 DIAGNOSIS — Z7189 Other specified counseling: Secondary | ICD-10-CM | POA: Diagnosis not present

## 2021-05-22 DIAGNOSIS — K219 Gastro-esophageal reflux disease without esophagitis: Secondary | ICD-10-CM | POA: Diagnosis not present

## 2021-05-22 NOTE — Patient Instructions (Signed)
Visit Information ? ?Thank you for taking time to visit with me today. Please don't hesitate to contact me if I can be of assistance to you before our next scheduled telephone appointment. ? ?Following are the goals we discussed today:  ?Discuss blood sugar trends and decreased appetite with primary provider. ?Take glucose device to next endocrinology appointment for review. ? ?Our next appointment is by telephone in 1 month. ? ?The patient verbalized understanding of instructions, educational materials, and care plan provided today and agreed to receive a mailed copy of patient instructions, educational materials, and care plan.  ? ?The patient has been provided with contact information for the care management team and has been advised to call with any health related questions or concerns.  ? ?[name], RN, MSN, CCM ?[facility] Care Management  ?Community Care Manager ?[phone] ? ?

## 2021-05-22 NOTE — Patient Outreach (Signed)
?[facility]) Care Management ?Telephonic RN Care Manager Note ? ? ?05/22/2021 ?Name:  Stephen Lynch MRN:  [id number] DOB:  Aug 24, 1945 ? ?Summary: ?Outreach attempt #2, successful to member and fiance.  Denies any urgent concerns, encouraged to contact this care manager with questions.   ? ?Recommendations/Changes made from today's visit: ?Continue to adhere to diabetic diet as well as insulin dose changes.   ?Monitor and record blood sugars several times a day, reminded to take device to follow up appointment.  ? ?Subjective: ?Stephen Lynch is an 76 y.o. year old male who is a primary patient of [place]. The care management team was consulted for assistance with care management and/or care coordination needs.   ? ?Telephonic RN Care Manager completed Telephone Visit today. ? ?Objective:  ? ?Medications Reviewed Today   ? ? Reviewed by Cinda Quest, [place] (Certified Medical Assistant) on 05/16/21 at 1123  Med List Status: <None>  ? ?Medication Order Taking? Sig Documenting Provider Last Dose Status Informant  ?ASPIRIN 81 PO [id number] Yes Take 81 mg by mouth daily.  [provider] Taking Active Spouse/Significant Other  ?B-D ULTRAFINE III SHORT PEN 31G X 8 MM MISC [id number] Yes Inject into the skin 3 (three) times daily. [provider] Taking Active   ?Calcium-Magnesium-Zinc 333-133-5 MG TABS [id number] Yes Take 1 tablet by mouth daily. [provider] Taking Active Spouse/Significant Other  ?Chromium 1 MG CAPS [id number] Yes Take by mouth. [provider] Taking Active Spouse/Significant Other  ?Continuous Blood Gluc Sensor (FREESTYLE LIBRE 2 SENSOR) MISC [id number] Yes APPLY EVERY 14 DAYS, CHECK GLUCOSE BEFORE EACH MEAL, 2 HRS AFTER, IN THE MORNING AND BEFORE BEDTIME [name], MD Taking Active   ?DULoxetine (CYMBALTA) 60 MG capsule [id number] Yes Take 60 mg by mouth daily. [provider] Taking Active Spouse/Significant Other   ?HUMALOG KWIKPEN 100 UNIT/ML KwikPen [id number] Yes in the morning, at noon, and at bedtime. 14units before breakfast, 5 units before lunch, and 14 units before dinner [provider] Taking Active   ?insulin lispro (HUMALOG) 100 UNIT/ML KwikPen Junior [id number] No Inject 0.2 mLs (20 Units total) into the skin 3 (three) times daily after meals.  ?Patient not taking: Reported on 05/16/2021  ? [name], MD Not Taking Active Spouse/Significant Other  ?lidocaine (LIDODERM) 5 % [id number] Yes Place 2 patches onto the skin daily. Remove & Discard patch within 12 hours or as directed by MD Flora Lipps, MD Taking Active Spouse/Significant Other  ?lidocaine-prilocaine (EMLA) cream [id number] No Apply 1 application topically as needed.  ?Patient not taking: Reported on 05/16/2021  ? [name], MD Not Taking Active   ?loperamide (IMODIUM) 1 MG/5ML solution [id number] Yes Take 2 mg by mouth daily as needed for diarrhea or loose stools. [provider] Taking Active Spouse/Significant Other  ?magnesium 30 MG tablet [id number] Yes Take 30 mg by mouth 2 (two) times daily. [provider] Taking Active Spouse/Significant Other  ?Menthol-Camphor (TIGER BALM ARTHRITIS RUB EX) [id number] Yes Apply 1 application topically 2 (two) times daily as needed (back pain). [provider] Taking Active Spouse/Significant Other  ?ondansetron (ZOFRAN) 4 MG tablet [id number] Yes Take 1 tablet (4 mg total) by mouth every 6 (six) hours as needed. for nausea [name], MD Taking Active   ?Oxycodone HCl 10 MG TABS [id number] Yes Take 1 tablet (10 mg total) by mouth every 4 (four) hours as needed. [name], MD Taking Active   ?prochlorperazine (COMPAZINE) 10 MG tablet [id number]  Yes Take 1 tablet (10 mg total) by mouth every 6 (six) hours as needed. [name], MD Taking Active   ?rosuvastatin (CRESTOR) 10 MG tablet [id number] Yes Take 1 tablet (10 mg total) by mouth daily.  ?Patient taking  differently: Take 10 mg by mouth every evening.  ? [name], MD Taking Active   ?TRESIBA FLEXTOUCH 100 UNIT/ML FlexTouch Pen [id number] Yes INJECT 36 UNITS INTO THE SKIN AT BEDTIME. [name], MD Taking Active   ?Med List Note [name], [facility] 05/01/20 1251): Zytiga filled through [facility]  ? ?  ?  ? ?  ? ? ? ?SDOH:  (Social Determinants of Health) assessments and interventions performed:  ? ? ? ?Care Plan ? ?Review of patient past medical history, allergies, medications, health status, including review of consultants reports, laboratory and other test data, was performed as part of comprehensive evaluation for care management services.  ? ?Care Plan : RNCM Plan of Care  ?Updates made by [name], RN since 05/22/2021 12:00 AM  ?  ? ?Problem: Knoweldge deficit regarding management of health conditions, prostate cancer and DM   ?Priority: High  ?  ? ?Long-Range Goal: Member will show adequate management of chronic conditions, Prostate Cancer and DM (goal extended due to not being on track)   ?Start Date: 10/27/2020  ?Expected End Date: 10/27/2021  ?This Visit's Progress: Not on track  ?Recent Progress: On track  ?Priority: High  ?Note:   ?Current Barriers:  ?Knowledge Deficits related to plan of care for management of DMII and Prostate cancer  ?Chronic Disease Management support and education needs related to DMII and Prostate cancer ?Transportation barriers ?Non-adherence to prescribed medication regimen ? ?RNCM Clinical Goal(s):  ?Patient will verbalize understanding of plan for management of DMII and Prostate cancer as evidenced by no hospital admissions ?take all medications exactly as prescribed and will call provider for medication related questions as evidenced by reported adherence by member and wife    ?demonstrate understanding of rationale for each prescribed medication as evidenced by Insulin [name])    ?attend all scheduled medical appointments: PCP, oncology, and  endocrinology as evidenced by record of attendance at provider offices        ?continue to work with Consulting civil engineer and/or Social Worker to address care management and care coordination needs related to DMII and prostate cancer as evidenced by adherence to CM Team Scheduled appointments     ?demonstrate ongoing self health care management ability to manage DM, including diet, medication management, and activity as evidenced by     through collaboration with RN Care manager, provider, and care team.  ? ?Interventions: ?Inter-disciplinary care team collaboration (see longitudinal plan of care) ?Evaluation of current treatment plan related to  self management and patient's adherence to plan as established by provider ? ? ?Diabetes:  (Status: Goal on track: NO.) ?Lab Results  ?Component Value Date  ? HGBA1C 8.4 (H) 05/14/2021  ?Assessed patient's understanding of A1c goal: <7% ?Provided education to patient about basic DM disease process; ?Reviewed medications with patient and discussed importance of medication adherence;        ?Reviewed prescribed diet with patient low sugars, low carbs; ?Reviewed scheduled/upcoming provider appointments including: Endocrinology in 07-28-2021;         ?Advised patient, providing education and rationale, to check cbg 2-3 times a day and record        ?Assessed social determinant of health barriers;        ? ?  Oncology:  (Status: Goal on track:  NO.) ?Assessment of understanding of oncology diagnosis:  ?Assessed patient understanding of cancer diagnosis and recommended treatment plan, Reviewed upcoming provider appointments and treatment appointments, and Assessed support system. Has consistent/reliable family or other support: Yes ? ? ?Update 11/11 - Significant other report member has been "out of it" as his cancer is progressing and requiring more pain medication for relief.  He has been seen by [facility], now active with the home based palliative care team.  Report he has not been  able to stay mobile due to the pain, decreased appetite, drinking boost for nutritional support.  Blood sugars have been elevated as he has not been taking medication appropriately due to not being alert enough.  She h

## 2021-05-25 ENCOUNTER — Encounter (HOSPITAL_COMMUNITY): Payer: Self-pay | Admitting: Emergency Medicine

## 2021-05-25 ENCOUNTER — Emergency Department (HOSPITAL_COMMUNITY): Payer: Medicare Other

## 2021-05-25 ENCOUNTER — Inpatient Hospital Stay (HOSPITAL_COMMUNITY)
Admission: EM | Admit: 2021-05-25 | Discharge: 2021-06-01 | DRG: 871 | Disposition: A | Discharge status: Home Health | Payer: Medicare Other | Attending: Internal Medicine | Admitting: Internal Medicine

## 2021-05-25 DIAGNOSIS — B3781 Candidal esophagitis: Secondary | ICD-10-CM | POA: Diagnosis present

## 2021-05-25 DIAGNOSIS — E46 Unspecified protein-calorie malnutrition: Secondary | ICD-10-CM

## 2021-05-25 DIAGNOSIS — E8809 Other disorders of plasma-protein metabolism, not elsewhere classified: Secondary | ICD-10-CM | POA: Diagnosis present

## 2021-05-25 DIAGNOSIS — Z8249 Family history of ischemic heart disease and other diseases of the circulatory system: Secondary | ICD-10-CM

## 2021-05-25 DIAGNOSIS — R748 Abnormal levels of other serum enzymes: Secondary | ICD-10-CM | POA: Diagnosis present

## 2021-05-25 DIAGNOSIS — K219 Gastro-esophageal reflux disease without esophagitis: Secondary | ICD-10-CM

## 2021-05-25 DIAGNOSIS — R509 Fever, unspecified: Secondary | ICD-10-CM

## 2021-05-25 DIAGNOSIS — A4151 Sepsis due to Escherichia coli [E. coli]: Principal | ICD-10-CM | POA: Diagnosis present

## 2021-05-25 DIAGNOSIS — Z7982 Long term (current) use of aspirin: Secondary | ICD-10-CM | POA: Diagnosis not present

## 2021-05-25 DIAGNOSIS — D509 Iron deficiency anemia, unspecified: Secondary | ICD-10-CM

## 2021-05-25 DIAGNOSIS — C7951 Secondary malignant neoplasm of bone: Secondary | ICD-10-CM | POA: Diagnosis present

## 2021-05-25 DIAGNOSIS — Z9221 Personal history of antineoplastic chemotherapy: Secondary | ICD-10-CM | POA: Diagnosis not present

## 2021-05-25 DIAGNOSIS — B370 Candidal stomatitis: Secondary | ICD-10-CM | POA: Diagnosis not present

## 2021-05-25 DIAGNOSIS — Z79899 Other long term (current) drug therapy: Secondary | ICD-10-CM

## 2021-05-25 DIAGNOSIS — E1165 Type 2 diabetes mellitus with hyperglycemia: Secondary | ICD-10-CM

## 2021-05-25 DIAGNOSIS — G9341 Metabolic encephalopathy: Secondary | ICD-10-CM | POA: Diagnosis not present

## 2021-05-25 DIAGNOSIS — J449 Chronic obstructive pulmonary disease, unspecified: Secondary | ICD-10-CM | POA: Diagnosis present

## 2021-05-25 DIAGNOSIS — M542 Cervicalgia: Secondary | ICD-10-CM | POA: Diagnosis not present

## 2021-05-25 DIAGNOSIS — C61 Malignant neoplasm of prostate: Secondary | ICD-10-CM | POA: Diagnosis not present

## 2021-05-25 DIAGNOSIS — Z794 Long term (current) use of insulin: Secondary | ICD-10-CM

## 2021-05-25 DIAGNOSIS — N390 Urinary tract infection, site not specified: Secondary | ICD-10-CM | POA: Diagnosis not present

## 2021-05-25 DIAGNOSIS — N3001 Acute cystitis with hematuria: Secondary | ICD-10-CM | POA: Diagnosis not present

## 2021-05-25 DIAGNOSIS — Z20822 Contact with and (suspected) exposure to covid-19: Secondary | ICD-10-CM | POA: Diagnosis present

## 2021-05-25 DIAGNOSIS — Z87891 Personal history of nicotine dependence: Secondary | ICD-10-CM | POA: Diagnosis not present

## 2021-05-25 DIAGNOSIS — R0689 Other abnormalities of breathing: Secondary | ICD-10-CM | POA: Diagnosis not present

## 2021-05-25 DIAGNOSIS — A419 Sepsis, unspecified organism: Secondary | ICD-10-CM | POA: Diagnosis not present

## 2021-05-25 DIAGNOSIS — R4182 Altered mental status, unspecified: Secondary | ICD-10-CM | POA: Diagnosis not present

## 2021-05-25 DIAGNOSIS — I10 Essential (primary) hypertension: Secondary | ICD-10-CM | POA: Diagnosis not present

## 2021-05-25 DIAGNOSIS — R600 Localized edema: Secondary | ICD-10-CM | POA: Diagnosis not present

## 2021-05-25 DIAGNOSIS — E876 Hypokalemia: Secondary | ICD-10-CM

## 2021-05-25 DIAGNOSIS — E785 Hyperlipidemia, unspecified: Secondary | ICD-10-CM | POA: Diagnosis present

## 2021-05-25 DIAGNOSIS — Z923 Personal history of irradiation: Secondary | ICD-10-CM

## 2021-05-25 DIAGNOSIS — R000 Tachycardia, unspecified: Secondary | ICD-10-CM | POA: Diagnosis not present

## 2021-05-25 DIAGNOSIS — R410 Disorientation, unspecified: Secondary | ICD-10-CM | POA: Diagnosis not present

## 2021-05-25 MED ORDER — SODIUM CHLORIDE 0.9 % IV BOLUS
1000.0000 mL | Freq: Once | INTRAVENOUS | Status: AC
Start: 2021-05-25 — End: 2021-05-26
  Administered 2021-05-25: 1000 mL via INTRAVENOUS

## 2021-05-25 MED ORDER — ACETAMINOPHEN 650 MG RE SUPP
650.0000 mg | Freq: Once | RECTAL | Status: AC
Start: 2021-05-25 — End: 2021-05-26
  Administered 2021-05-26: 650 mg via RECTAL
  Filled 2021-05-25: qty 1

## 2021-05-25 NOTE — ED Triage Notes (Signed)
Pt in from home via [facility] as code sepsis. Per wife and medics, pt a&ox3 on scene, poor oral intake x past 3 days. Hx of prostate CA, last IV chemo 2 wks ago. Temp 102 en route, initially HR 140's - given 24ms NS en route. Arrives a&ox4 ?

## 2021-05-25 NOTE — ED Provider Notes (Signed)
?[facility] DEPT ?Provider Note ? ? ?CSN: [id number] ?Arrival date & time: 05/25/21  2246 ? ?  ? ?History ? ?Chief Complaint  ?Patient presents with  ? Code Sepsis  ? ? ?Stephen Lynch is a 76 y.o. male. ? ?The history is provided by the patient and medical records.  ? ?76 year old male with history of COPD, diabetes, dyslipidemia, hypertension, metastatic prostate cancer with mets to bone, presenting to the ED with generalized weakness and fever.  Currently on Jevtana chemotherapy and Eligard ADT-- last treatment about 2 weeks ago.  Wife reports poor oral intake for the past 3 days.  No vomiting or diarrhea.  Had fever 102F with EMS but unable to tolerate PO tylenol.  Patient denies any current pain.  Has been having difficulty urinating.   ? ?Home Medications ?Prior to Admission medications   ?Medication Sig Start Date End Date Taking? Authorizing Provider  ?ASPIRIN 81 PO Take 81 mg by mouth daily.     [provider]  ?B-D ULTRAFINE III SHORT PEN 31G X 8 MM MISC Inject into the skin 3 (three) times daily. 01/25/21   [provider]  ?Calcium-Magnesium-Zinc 333-133-5 MG TABS Take 1 tablet by mouth daily.    [provider]  ?Chromium 1 MG CAPS Take by mouth.    [provider]  ?Continuous Blood Gluc Sensor (FREESTYLE LIBRE 2 SENSOR) MISC APPLY EVERY 14 DAYS, CHECK GLUCOSE BEFORE EACH MEAL, 2 HRS AFTER, IN THE MORNING AND BEFORE BEDTIME 02/26/21   [name], MD  ?DULoxetine (CYMBALTA) 60 MG capsule Take 60 mg by mouth daily. 03/20/19   [provider]  ?Cleda Clarks 100 UNIT/ML KwikPen in the morning, at noon, and at bedtime. 14units before breakfast, 5 units before lunch, and 14 units before dinner 01/03/21   [provider]  ?insulin lispro (HUMALOG) 100 UNIT/ML KwikPen Junior Inject 0.2 mLs (20 Units total) into the skin 3 (three) times daily after meals. ?Patient not taking: Reported on 05/16/2021 09/25/16   [name], MD   ?lidocaine (LIDODERM) 5 % Place 2 patches onto the skin daily. Remove & Discard patch within 12 hours or as directed by MD 03/26/19   Flora Lipps, MD  ?lidocaine-prilocaine (EMLA) cream Apply 1 application topically as needed. ?Patient not taking: Reported on 05/16/2021 03/26/21   [name], MD  ?loperamide (IMODIUM) 1 MG/5ML solution Take 2 mg by mouth daily as needed for diarrhea or loose stools.    [provider]  ?magnesium 30 MG tablet Take 30 mg by mouth 2 (two) times daily.    [provider]  ?Menthol-Camphor (TIGER BALM ARTHRITIS RUB EX) Apply 1 application topically 2 (two) times daily as needed (back pain).    [provider]  ?ondansetron (ZOFRAN) 4 MG tablet Take 1 tablet (4 mg total) by mouth every 6 (six) hours as needed. for nausea 05/03/21   [name], MD  ?Oxycodone HCl 10 MG TABS Take 1 tablet (10 mg total) by mouth every 4 (four) hours as needed. 04/11/21   [name], MD  ?prochlorperazine (COMPAZINE) 10 MG tablet Take 1 tablet (10 mg total) by mouth every 6 (six) hours as needed. 03/26/21 05/25/21  [name], MD  ?rosuvastatin (CRESTOR) 10 MG tablet Take 1 tablet (10 mg total) by mouth daily. ?Patient taking differently: Take 10 mg by mouth every evening. 09/25/16   [name], MD  ?TRESIBA FLEXTOUCH 100 UNIT/ML FlexTouch Pen INJECT 36 UNITS INTO THE SKIN AT  BEDTIME. 03/14/21   [name], MD  ?   ? ?Allergies    ?Chlorhexidine   ? ?Review of Systems   ?Review of Systems  ?Constitutional:  Positive for fever.  ?All other systems reviewed and are negative. ? ?Physical Exam ?Updated Vital Signs ?BP 133/68 (BP Location: Right Arm)   Pulse (!) 104   Temp 99.5 ?F (37.5 ?C) (Oral)   Resp (!) 24   Wt 87.5 kg   SpO2 100%   BMI 25.45 kg/m?  ? ?Physical Exam ?Vitals and nursing note reviewed.  ?Constitutional:   ?   Appearance: He is well-developed.  ?   Comments: Warm to touch, appears generally weak  ?HENT:  ?   Head: Normocephalic and  atraumatic.  ?Eyes:  ?   Conjunctiva/sclera: Conjunctivae normal.  ?   Pupils: Pupils are equal, round, and reactive to light.  ?Cardiovascular:  ?   Rate and Rhythm: Normal rate and regular rhythm.  ?   Heart sounds: Normal heart sounds.  ?Pulmonary:  ?   Effort: Pulmonary effort is normal.  ?   Breath sounds: Normal breath sounds.  ?Abdominal:  ?   General: Bowel sounds are normal.  ?   Palpations: Abdomen is soft.  ?Musculoskeletal:     ?   General: Normal range of motion.  ?   Cervical back: Normal range of motion.  ?Skin: ?   General: Skin is warm and dry.  ?Neurological:  ?   Mental Status: He is alert and oriented to person, place, and time.  ?   Comments: AAOx3, able to answer questions and follow commands, generally weak without focal deficit  ? ? ?ED Results / Procedures / Treatments   ?Labs ?(all labs ordered are listed, but only abnormal results are displayed) ?Labs Reviewed  ?CBC WITH DIFFERENTIAL/PLATELET - Abnormal; Notable for the following components:  ?    Result Value  ? WBC 12.5 (*)   ? RBC 2.86 (*)   ? Hemoglobin 8.2 (*)   ? HCT 26.4 (*)   ? RDW 21.8 (*)   ? Neutro Abs 10.7 (*)   ? Lymphs Abs 0.4 (*)   ? Monocytes Absolute 1.2 (*)   ? Abs Immature Granulocytes 0.13 (*)   ? All other components within normal limits  ?COMPREHENSIVE METABOLIC PANEL - Abnormal; Notable for the following components:  ? Glucose, Bld 167 (*)   ? Calcium 8.0 (*)   ? Total Protein 6.1 (*)   ? Albumin 2.2 (*)   ? AST 75 (*)   ? Alkaline Phosphatase 391 (*)   ? All other components within normal limits  ?URINALYSIS, ROUTINE W REFLEX MICROSCOPIC - Abnormal; Notable for the following components:  ? APPearance CLOUDY (*)   ? Hgb urine dipstick LARGE (*)   ? Ketones, ur 5 (*)   ? Protein, ur 30 (*)   ? Nitrite POSITIVE (*)   ? Leukocytes,Ua LARGE (*)   ? RBC / HPF >50 (*)   ? WBC, UA >50 (*)   ? Bacteria, UA MANY (*)   ? All other components within normal limits  ?RESP PANEL BY RT-PCR (FLU A&B, COVID) [id number]  ?CULTURE, BLOOD  (ROUTINE X 2)  ?CULTURE, BLOOD (ROUTINE X 2)  ?URINE CULTURE  ?RESPIRATORY PANEL BY PCR  ?LACTIC ACID, PLASMA  ?LACTIC ACID, PLASMA  ? ? ?EKG ?EKG Interpretation ? ?Date/Time:  Friday May 25 2021 23:04:38 EDT ?Ventricular Rate:  112 ?PR Interval:  133 ?QRS Duration: 80 ?QT Interval:  335 ?QTC Calculation: 458 ?R Axis:   56 ?Text Interpretation: Sinus tachycardia Nonspecific T abnormalities, diffuse leads Confirmed by Quintella Reichert [phone]) on 05/26/2021 12:42:38 AM ? ?Radiology ?CT HEAD WO CONTRAST (5MM) ? ?Result Date: 05/26/2021 ?CLINICAL DATA:  Delirium, neck pain. EXAM: CT HEAD WITHOUT CONTRAST CT CERVICAL SPINE WITHOUT CONTRAST TECHNIQUE: Multidetector CT imaging of the head and cervical spine was performed following the standard protocol without intravenous contrast. Multiplanar CT image reconstructions of the cervical spine were also generated. RADIATION DOSE REDUCTION: This exam was performed according to the departmental dose-optimization program which includes automated exposure control, adjustment of the mA and/or kV according to patient size and/or use of iterative reconstruction technique. COMPARISON:  03/23/2019, 12/07/2020. FINDINGS: CT HEAD FINDINGS Brain: No acute intracranial hemorrhage, midline shift or mass effect. No extra-axial fluid collection. Periventricular white matter hypodensities are present bilaterally. No hydrocephalus. Vascular: No hyperdense vessel or unexpected calcification. Skull: Normal. Negative for fracture or focal lesion. Sinuses/Orbits: No acute finding. Other: There is partial opacification of the mastoid air cells on the left. CT CERVICAL SPINE FINDINGS Alignment: Normal. Skull base and vertebrae: No acute fracture. Diffuse sclerotic lesions are noted in the cervical spine, likely related to patient's known history of osteoblastic metastasis. Soft tissues and spinal canal: No prevertebral fluid or swelling. No visible canal hematoma. Disc levels: Multilevel  intervertebral disc space narrowing, degenerative endplate changes, and mild facet arthropathy. Upper chest: Irregular ground-glass opacities are noted in the upper lobes bilaterally. There is enlargement of the thyroid gland with b

## 2021-05-26 ENCOUNTER — Emergency Department (HOSPITAL_COMMUNITY): Payer: Medicare Other

## 2021-05-26 ENCOUNTER — Other Ambulatory Visit: Payer: Self-pay

## 2021-05-26 DIAGNOSIS — G9341 Metabolic encephalopathy: Secondary | ICD-10-CM

## 2021-05-26 DIAGNOSIS — E1165 Type 2 diabetes mellitus with hyperglycemia: Secondary | ICD-10-CM

## 2021-05-26 DIAGNOSIS — R600 Localized edema: Secondary | ICD-10-CM | POA: Diagnosis present

## 2021-05-26 DIAGNOSIS — A4151 Sepsis due to Escherichia coli [E. coli]: Secondary | ICD-10-CM | POA: Diagnosis present

## 2021-05-26 DIAGNOSIS — E876 Hypokalemia: Secondary | ICD-10-CM | POA: Diagnosis not present

## 2021-05-26 DIAGNOSIS — D509 Iron deficiency anemia, unspecified: Secondary | ICD-10-CM | POA: Diagnosis present

## 2021-05-26 DIAGNOSIS — K219 Gastro-esophageal reflux disease without esophagitis: Secondary | ICD-10-CM

## 2021-05-26 DIAGNOSIS — J449 Chronic obstructive pulmonary disease, unspecified: Secondary | ICD-10-CM | POA: Diagnosis present

## 2021-05-26 DIAGNOSIS — N390 Urinary tract infection, site not specified: Secondary | ICD-10-CM | POA: Diagnosis present

## 2021-05-26 DIAGNOSIS — Z9221 Personal history of antineoplastic chemotherapy: Secondary | ICD-10-CM | POA: Diagnosis not present

## 2021-05-26 DIAGNOSIS — I10 Essential (primary) hypertension: Secondary | ICD-10-CM | POA: Diagnosis present

## 2021-05-26 DIAGNOSIS — Z20822 Contact with and (suspected) exposure to covid-19: Secondary | ICD-10-CM | POA: Diagnosis present

## 2021-05-26 DIAGNOSIS — A419 Sepsis, unspecified organism: Secondary | ICD-10-CM | POA: Diagnosis not present

## 2021-05-26 DIAGNOSIS — Z923 Personal history of irradiation: Secondary | ICD-10-CM | POA: Diagnosis not present

## 2021-05-26 DIAGNOSIS — Z794 Long term (current) use of insulin: Secondary | ICD-10-CM | POA: Diagnosis not present

## 2021-05-26 DIAGNOSIS — N3001 Acute cystitis with hematuria: Secondary | ICD-10-CM | POA: Diagnosis present

## 2021-05-26 DIAGNOSIS — Z87891 Personal history of nicotine dependence: Secondary | ICD-10-CM | POA: Diagnosis not present

## 2021-05-26 DIAGNOSIS — C61 Malignant neoplasm of prostate: Secondary | ICD-10-CM

## 2021-05-26 DIAGNOSIS — Z79899 Other long term (current) drug therapy: Secondary | ICD-10-CM | POA: Diagnosis not present

## 2021-05-26 DIAGNOSIS — M542 Cervicalgia: Secondary | ICD-10-CM | POA: Diagnosis not present

## 2021-05-26 DIAGNOSIS — E785 Hyperlipidemia, unspecified: Secondary | ICD-10-CM | POA: Diagnosis present

## 2021-05-26 DIAGNOSIS — B370 Candidal stomatitis: Secondary | ICD-10-CM | POA: Diagnosis present

## 2021-05-26 DIAGNOSIS — Z7982 Long term (current) use of aspirin: Secondary | ICD-10-CM | POA: Diagnosis not present

## 2021-05-26 DIAGNOSIS — C7951 Secondary malignant neoplasm of bone: Secondary | ICD-10-CM | POA: Diagnosis present

## 2021-05-26 DIAGNOSIS — E8809 Other disorders of plasma-protein metabolism, not elsewhere classified: Secondary | ICD-10-CM | POA: Diagnosis present

## 2021-05-26 DIAGNOSIS — B3781 Candidal esophagitis: Secondary | ICD-10-CM | POA: Diagnosis present

## 2021-05-26 DIAGNOSIS — R410 Disorientation, unspecified: Secondary | ICD-10-CM | POA: Diagnosis not present

## 2021-05-26 DIAGNOSIS — R748 Abnormal levels of other serum enzymes: Secondary | ICD-10-CM | POA: Diagnosis present

## 2021-05-26 LAB — CBC WITH DIFFERENTIAL/PLATELET
Abs Immature Granulocytes: 0.13 10*3/uL — ABNORMAL HIGH (ref 0.00–0.07)
Basophils Absolute: 0 10*3/uL (ref 0.0–0.1)
Basophils Relative: 0 %
Eosinophils Absolute: 0 10*3/uL (ref 0.0–0.5)
Eosinophils Relative: 0 %
HCT: 26.4 % — ABNORMAL LOW (ref 39.0–52.0)
Hemoglobin: 8.2 g/dL — ABNORMAL LOW (ref 13.0–17.0)
Immature Granulocytes: 1 %
Lymphocytes Relative: 3 %
Lymphs Abs: 0.4 10*3/uL — ABNORMAL LOW (ref 0.7–4.0)
MCH: 28.7 pg (ref 26.0–34.0)
MCHC: 31.1 g/dL (ref 30.0–36.0)
MCV: 92.3 fL (ref 80.0–100.0)
Monocytes Absolute: 1.2 10*3/uL — ABNORMAL HIGH (ref 0.1–1.0)
Monocytes Relative: 10 %
Neutro Abs: 10.7 10*3/uL — ABNORMAL HIGH (ref 1.7–7.7)
Neutrophils Relative %: 86 %
Platelets: 246 10*3/uL (ref 150–400)
RBC: 2.86 MIL/uL — ABNORMAL LOW (ref 4.22–5.81)
RDW: 21.8 % — ABNORMAL HIGH (ref 11.5–15.5)
WBC: 12.5 10*3/uL — ABNORMAL HIGH (ref 4.0–10.5)
nRBC: 0.2 % (ref 0.0–0.2)

## 2021-05-26 LAB — COMPREHENSIVE METABOLIC PANEL
ALT: 19 U/L (ref 0–44)
AST: 75 U/L — ABNORMAL HIGH (ref 15–41)
Albumin: 2.2 g/dL — ABNORMAL LOW (ref 3.5–5.0)
Alkaline Phosphatase: 391 U/L — ABNORMAL HIGH (ref 38–126)
Anion gap: 10 (ref 5–15)
BUN: 13 mg/dL (ref 8–23)
CO2: 24 mmol/L (ref 22–32)
Calcium: 8 mg/dL — ABNORMAL LOW (ref 8.9–10.3)
Chloride: 105 mmol/L (ref 98–111)
Creatinine, Ser: 0.7 mg/dL (ref 0.61–1.24)
GFR, Estimated: >60 mL/min (ref >60)
Glucose, Bld: 167 mg/dL — ABNORMAL HIGH (ref 70–99)
Potassium: 3.7 mmol/L (ref 3.5–5.1)
Sodium: 139 mmol/L (ref 135–145)
Total Bilirubin: 0.6 mg/dL (ref 0.3–1.2)
Total Protein: 6.1 g/dL — ABNORMAL LOW (ref 6.5–8.1)

## 2021-05-26 LAB — RESPIRATORY PANEL BY PCR

## 2021-05-26 LAB — URINALYSIS, ROUTINE W REFLEX MICROSCOPIC
Bilirubin Urine: NEGATIVE
Glucose, UA: NEGATIVE mg/dL
Ketones, ur: 5 mg/dL — AB
Nitrite: POSITIVE — AB
Protein, ur: 30 mg/dL — AB
RBC / HPF: >50 RBC/hpf — ABNORMAL HIGH (ref 0–5)
Specific Gravity, Urine: 1.017 (ref 1.005–1.030)
WBC, UA: >50 WBC/hpf — ABNORMAL HIGH (ref 0–5)
pH: 5 (ref 5.0–8.0)

## 2021-05-26 LAB — LACTIC ACID, PLASMA: Lactic Acid, Venous: 1.6 mmol/L (ref 0.5–1.9)

## 2021-05-26 LAB — RESP PANEL BY RT-PCR (FLU A&B, COVID) ARPGX2
Influenza A by PCR: NEGATIVE
Influenza B by PCR: NEGATIVE
SARS Coronavirus 2 by RT PCR: NEGATIVE

## 2021-05-26 LAB — BRAIN NATRIURETIC PEPTIDE: B Natriuretic Peptide: 83.3 pg/mL (ref 0.0–100.0)

## 2021-05-26 LAB — PROTIME-INR
INR: 1.8 — ABNORMAL HIGH (ref 0.8–1.2)
Prothrombin Time: 20.4 seconds — ABNORMAL HIGH (ref 11.4–15.2)

## 2021-05-26 LAB — APTT: aPTT: 44 seconds — ABNORMAL HIGH (ref 24–36)

## 2021-05-26 LAB — GLUCOSE, CAPILLARY
Glucose-Capillary: 140 mg/dL — ABNORMAL HIGH (ref 70–99)
Glucose-Capillary: 170 mg/dL — ABNORMAL HIGH (ref 70–99)

## 2021-05-26 LAB — CBG MONITORING, ED: Glucose-Capillary: 153 mg/dL — ABNORMAL HIGH (ref 70–99)

## 2021-05-26 MED ORDER — ONDANSETRON HCL 4 MG PO TABS: 4.0000 mg | ORAL_TABLET | Freq: Four times a day (QID) | ORAL | Status: DC | PRN

## 2021-05-26 MED ORDER — MAGNESIUM 30 MG PO TABS: 30.0000 mg | ORAL_TABLET | Freq: Every day | ORAL | Status: DC

## 2021-05-26 MED ORDER — ENOXAPARIN SODIUM 40 MG/0.4ML IJ SOSY
40.0000 mg | PREFILLED_SYRINGE | INTRAMUSCULAR | Status: DC
  Administered 2021-05-26 – 2021-05-31 (×6): 40 mg via SUBCUTANEOUS
  Filled 2021-05-26 (×7): qty 0.4

## 2021-05-26 MED ORDER — LIDOCAINE-PRILOCAINE 2.5-2.5 % EX CREA: 1.0000 "application " | TOPICAL_CREAM | CUTANEOUS | Status: DC | PRN

## 2021-05-26 MED ORDER — ASPIRIN 81 MG PO CHEW
81.0000 mg | CHEWABLE_TABLET | Freq: Every day | ORAL | Status: DC
Start: 2021-05-26 — End: 2021-06-01
  Administered 2021-05-26 – 2021-06-01 (×7): 81 mg via ORAL
  Filled 2021-05-26 (×7): qty 1

## 2021-05-26 MED ORDER — ONDANSETRON HCL 4 MG/2ML IJ SOLN
4.0000 mg | Freq: Four times a day (QID) | INTRAMUSCULAR | Status: DC | PRN
Start: 2021-05-26 — End: 2021-06-01

## 2021-05-26 MED ORDER — ACETAMINOPHEN 325 MG PO TABS
650.0000 mg | ORAL_TABLET | Freq: Four times a day (QID) | ORAL | Status: DC | PRN
  Administered 2021-05-26 – 2021-05-29 (×10): 650 mg via ORAL
  Filled 2021-05-26 (×8): qty 2

## 2021-05-26 MED ORDER — LOPERAMIDE HCL 1 MG/7.5ML PO SUSP
2.0000 mg | Freq: Every day | ORAL | Status: DC | PRN
  Filled 2021-05-26: qty 15

## 2021-05-26 MED ORDER — GLUCERNA SHAKE PO LIQD
237.0000 mL | Freq: Three times a day (TID) | ORAL | Status: DC
  Administered 2021-05-26 – 2021-05-28 (×4): 237 mL via ORAL
  Filled 2021-05-26 (×7): qty 237

## 2021-05-26 MED ORDER — OXYCODONE HCL 5 MG PO TABS
10.0000 mg | ORAL_TABLET | ORAL | Status: DC | PRN
  Administered 2021-05-28 – 2021-06-01 (×11): 10 mg via ORAL
  Filled 2021-05-26 (×11): qty 2

## 2021-05-26 MED ORDER — ROSUVASTATIN CALCIUM 10 MG PO TABS
10.0000 mg | ORAL_TABLET | Freq: Every evening | ORAL | Status: DC
  Administered 2021-05-26 – 2021-05-31 (×6): 10 mg via ORAL
  Filled 2021-05-26 (×6): qty 1

## 2021-05-26 MED ORDER — LIDOCAINE 5 % EX PTCH
2.0000 | MEDICATED_PATCH | CUTANEOUS | Status: DC
  Administered 2021-05-26 – 2021-06-01 (×7): 2 via TRANSDERMAL
  Filled 2021-05-26 (×7): qty 2

## 2021-05-26 MED ORDER — FAMOTIDINE 20 MG PO TABS
20.0000 mg | ORAL_TABLET | Freq: Every day | ORAL | Status: DC
  Administered 2021-05-26 – 2021-06-01 (×7): 20 mg via ORAL
  Filled 2021-05-26 (×7): qty 1

## 2021-05-26 MED ORDER — ENSURE ENLIVE PO LIQD
237.0000 mL | Freq: Two times a day (BID) | ORAL | Status: DC
  Administered 2021-05-27 – 2021-05-30 (×4): 237 mL via ORAL

## 2021-05-26 MED ORDER — ALBUTEROL SULFATE (2.5 MG/3ML) 0.083% IN NEBU: 2.5000 mg | INHALATION_SOLUTION | Freq: Four times a day (QID) | RESPIRATORY_TRACT | Status: DC | PRN

## 2021-05-26 MED ORDER — SODIUM CHLORIDE 0.9 % IV SOLN
2.0000 g | INTRAVENOUS | Status: DC
  Administered 2021-05-26 – 2021-05-28 (×3): 2 g via INTRAVENOUS
  Filled 2021-05-26 (×3): qty 20

## 2021-05-26 MED ORDER — SODIUM CHLORIDE 0.9 % IV SOLN
2.0000 g | Freq: Once | INTRAVENOUS | Status: AC
  Administered 2021-05-26: 2 g via INTRAVENOUS
  Filled 2021-05-26: qty 12.5

## 2021-05-26 MED ORDER — CALCIUM-MAGNESIUM-ZINC 333-133-5 MG PO TABS: 1.0000 | ORAL_TABLET | Freq: Every day | ORAL | Status: DC

## 2021-05-26 MED ORDER — INSULIN ASPART 100 UNIT/ML IJ SOLN
0.0000 [IU] | Freq: Three times a day (TID) | INTRAMUSCULAR | Status: DC
  Administered 2021-05-26: 3 [IU] via SUBCUTANEOUS
  Administered 2021-05-26 – 2021-05-27 (×2): 4 [IU] via SUBCUTANEOUS
  Administered 2021-05-27: 3 [IU] via SUBCUTANEOUS
  Administered 2021-05-27: 4 [IU] via SUBCUTANEOUS
  Administered 2021-05-28: 7 [IU] via SUBCUTANEOUS
  Administered 2021-05-28: 4 [IU] via SUBCUTANEOUS
  Administered 2021-05-28: 3 [IU] via SUBCUTANEOUS
  Administered 2021-05-29: 7 [IU] via SUBCUTANEOUS
  Administered 2021-05-29: 3 [IU] via SUBCUTANEOUS
  Administered 2021-05-29: 7 [IU] via SUBCUTANEOUS
  Administered 2021-05-30: 3 [IU] via SUBCUTANEOUS
  Administered 2021-05-30: 4 [IU] via SUBCUTANEOUS
  Administered 2021-05-30: 7 [IU] via SUBCUTANEOUS
  Administered 2021-05-31: 11 [IU] via SUBCUTANEOUS
  Administered 2021-05-31: 7 [IU] via SUBCUTANEOUS
  Administered 2021-05-31 – 2021-06-01 (×2): 4 [IU] via SUBCUTANEOUS
  Administered 2021-06-01: 7 [IU] via SUBCUTANEOUS
  Filled 2021-05-26: qty 0.2

## 2021-05-26 MED ORDER — MENTHOL-CAMPHOR 11-11 % EX CREA: TOPICAL_CREAM | Freq: Two times a day (BID) | CUTANEOUS | Status: DC | PRN

## 2021-05-26 MED ORDER — NYSTATIN 100000 UNIT/ML MT SUSP
5.0000 mL | Freq: Four times a day (QID) | OROMUCOSAL | Status: DC
  Administered 2021-05-26 – 2021-06-01 (×23): 500000 [IU] via ORAL
  Filled 2021-05-26 (×23): qty 5

## 2021-05-26 MED ORDER — INSULIN GLARGINE-YFGN 100 UNIT/ML ~~LOC~~ SOLN
21.0000 [IU] | Freq: Every day | SUBCUTANEOUS | Status: DC
  Administered 2021-05-26 – 2021-05-31 (×6): 21 [IU] via SUBCUTANEOUS
  Filled 2021-05-26 (×7): qty 0.21

## 2021-05-26 MED ORDER — SODIUM CHLORIDE 0.9% FLUSH
3.0000 mL | Freq: Two times a day (BID) | INTRAVENOUS | Status: DC
  Administered 2021-05-26 – 2021-06-01 (×13): 3 mL via INTRAVENOUS

## 2021-05-26 MED ORDER — FUROSEMIDE 10 MG/ML IJ SOLN
20.0000 mg | Freq: Once | INTRAMUSCULAR | Status: AC
  Administered 2021-05-26: 20 mg via INTRAVENOUS
  Filled 2021-05-26: qty 2

## 2021-05-26 MED ORDER — VANCOMYCIN HCL 2000 MG/400ML IV SOLN
2000.0000 mg | Freq: Once | INTRAVENOUS | Status: AC
  Administered 2021-05-26: 2000 mg via INTRAVENOUS
  Filled 2021-05-26: qty 400

## 2021-05-26 MED ORDER — ACETAMINOPHEN 650 MG RE SUPP: 650.0000 mg | Freq: Four times a day (QID) | RECTAL | Status: DC | PRN

## 2021-05-26 NOTE — ED Notes (Signed)
Patient found out of bed and sitting in chair.  Monitoring cords and IV remain intake.  Patient returned to bed with assistance.  Bed alarm placed and on.  Patient moved to room closer to nursing station for RN to view ?

## 2021-05-26 NOTE — Progress Notes (Signed)
A consult was received from an ED physician for Vancomycin + Cefepime per pharmacy dosing.  The patient's profile has been reviewed for ht/wt/allergies/indication/available labs.   ?A one time order has been placed for Vnaocmycin 2gm IV + Cefepime 2gm IV.  Further antibiotics/pharmacy consults should be ordered by admitting physician if indicated.       ?                ?Thank you, ?Netta Cedars PharmD ?05/26/2021  12:29 AM ? ?

## 2021-05-26 NOTE — ED Notes (Signed)
Patient taken to CT at this time.

## 2021-05-26 NOTE — Progress Notes (Signed)
PHARMACIST - PHYSICIAN ORDER COMMUNICATION ? ?CONCERNING: P&T Medication Policy on Herbal Medications ? ?DESCRIPTION:  This patient?s order for:  magnesium 30 mg and Ca-Mg-Zinc  has been noted. ? ?This product(s) is classified as an ?herbal? or natural product. ?Due to a lack of definitive safety studies or FDA approval, nonstandard manufacturing practices, plus the potential risk of unknown drug-drug interactions while on inpatient medications, the Pharmacy and Therapeutics Committee does not permit the use of ?herbal? or natural products of this type within [facility]. ?  ?ACTION TAKEN: ?The pharmacy department is unable to verify this order at this time and your patient has been informed of this safety policy. ?Please reevaluate patient?s clinical condition at discharge and address if the herbal or natural product(s) should be resumed at that time. ? ?

## 2021-05-26 NOTE — ED Notes (Signed)
Patient resting quietly.  States he "feels better."  Updated on plan of care ?

## 2021-05-26 NOTE — H&P (Signed)
?History and Physical  ? ? ?Patient: Stephen Lynch AXK:[id number] DOB: 1945-08-21 ?DOA: 05/25/2021 ?DOS: the patient was seen and examined on 05/26/2021 ?PCP: [facility]  ?Patient coming from: home via EMS ? ?Chief Complaint:  ?Chief Complaint  ?Patient presents with  ? Code Sepsis  ? ?HPI: Stephen Lynch is a 76 y.o. male with medical history significant of hypertension, dyslipidemia, diabetes mellitus type 2, and metastatic prostate cancer on chemotherapy who presented last night after his wife found him to be significantly weak and confused with fever.  He had received his last chemotherapy of  Jevtana and Eligard was approximately 2 weeks ago, and his wife notes that he was scheduled to have infusion on 4/20.  This week she noticed that the patient had decreased appetite with complaints of difficulty of swallowing.  He was only eating mashed potatoes and pickled pig feet.  Patient complained of having leg swelling, left hip pain, and some discomfort with urination.  He reported having some nausea and vomiting a couple days ago.  Wife notes that he is normally alert and oriented x3, but was more confused and seemed out of it.  Yesterday, patient got to the point where he was unable to walk, could not talk, and seemed to have fever for which his wife called EMS.  In route with EMS fever was reported to be up to 102 ?F with heart rates into the 140s, and he was given 200 mg liters of normal saline IV fluids.  His wife also notes that he was started on oral nystatin yesterday. ? ?Upon admission into the emergency department patient was noted to be febrile up to 103.4 ?F with tachycardia and tachypnea and all other vital signs maintained.  Labs significant for 12.5, hemoglobin 8.2, alkaline phosphatase 391, AST 75, albumin 2.2, lactic acid 1.6.  Chest x-ray noted chronic interstitial prominence consistent with scarring, and no other acute abnormality.  Influenza, COVID-19, and complete respiratory virus  panel ordered noted to be negative.  Blood and urine cultures have been obtained.  CT scan of the head and cervical spine noted no acute intercranial process and diffusely sclerotic bones in the cervical spine consistent with osteoblastic metastases.  Urinalysis was positive for large hemoglobin, large leukocytes, positive nitrites, many bacteria, greater than 50 RBCs/hpf, and greater than 50 WBCs.  Patient has been given 1 L normal saline IV fluids, vancomycin, cefepime, and Tylenol 650 mg rectally. ? ?Review of Systems: unable to review all systems due to the inability of the patient to answer questions.  As he appears to be disoriented. ?Past Medical History:  ?Diagnosis Date  ? Cancer [facility])   ? prostate  ? Diabetes mellitus without complication ([place])   ? Dyslipidemia   ? Hypertension   ? ?Past Surgical History:  ?Procedure Laterality Date  ? EYE SURGERY    ? IR IMAGING GUIDED PORT INSERTION  06/12/2020  ? ?Social History:  reports that he has quit smoking. His smoking use included cigarettes. He has a 7.50 pack-year smoking history. He has never used smokeless tobacco. He reports that he does not currently use alcohol. He reports that he does not currently use drugs. ? ?Allergies  ?Allergen Reactions  ? Chlorhexidine   ?  Unknown reaction  ? ? ?Family History  ?Problem Relation Age of Onset  ? CAD Mother 34  ? Other Father 28  ?     age  ? Other Brother 67  ?     unknown cause  ? ? ?  Prior to Admission medications   ?Medication Sig Start Date End Date Taking? Authorizing Provider  ?acetaminophen (TYLENOL) 500 MG tablet Take 1,000 mg by mouth daily as needed for moderate pain.   Yes [provider]  ?ASPIRIN 81 PO Take 81 mg by mouth daily.    Yes [provider]  ?Calcium-Magnesium-Zinc [phone] MG TABS Take 1 tablet by mouth at bedtime.   Yes [provider]  ?famotidine (PEPCID) 20 MG tablet Take 20 mg by mouth daily. 05/22/21  Yes [provider]  ?HUMALOG KWIKPEN 100 UNIT/ML  KwikPen 10-20 Units in the morning, at noon, and at bedtime. Sliding scale 01/03/21  Yes [provider]  ?lidocaine (LIDODERM) 5 % Place 2 patches onto the skin daily. Remove & Discard patch within 12 hours or as directed by MD 03/26/19  Yes Pokhrel, Corrie Mckusick, MD  ?lidocaine-prilocaine (EMLA) cream Apply 1 application topically as needed. 03/26/21  Yes [name], MD  ?loperamide (IMODIUM) 1 MG/5ML solution Take 2 mg by mouth daily as needed for diarrhea or loose stools.   Yes [provider]  ?magnesium 30 MG tablet Take 30 mg by mouth daily.   Yes [provider]  ?Menthol-Camphor (TIGER BALM ARTHRITIS RUB EX) Apply 1 application topically 2 (two) times daily as needed (back pain).   Yes [provider]  ?ondansetron (ZOFRAN) 4 MG tablet Take 1 tablet (4 mg total) by mouth every 6 (six) hours as needed. for nausea 05/03/21  Yes [name], MD  ?Oxycodone HCl 10 MG TABS Take 1 tablet (10 mg total) by mouth every 4 (four) hours as needed. ?Patient taking differently: Take 10 mg by mouth every 4 (four) hours as needed (for severe pain). 04/11/21  Yes [name], MD  ?prochlorperazine (COMPAZINE) 10 MG tablet Take 1 tablet (10 mg total) by mouth every 6 (six) hours as needed. 03/26/21 05/27/22 Yes [name], MD  ?rosuvastatin (CRESTOR) 10 MG tablet Take 1 tablet (10 mg total) by mouth daily. ?Patient taking differently: Take 10 mg by mouth every evening. 09/25/16  Yes [name], MD  ?TRESIBA FLEXTOUCH 100 UNIT/ML FlexTouch Pen INJECT 36 UNITS INTO THE SKIN AT BEDTIME. ?Patient taking differently: Inject 42 Units into the skin at bedtime. 03/14/21  Yes [name], MD  ?B-D ULTRAFINE III SHORT PEN 31G X 8 MM MISC Inject into the skin 3 (three) times daily. 01/25/21   [provider]  ?Continuous Blood Gluc Sensor (FREESTYLE LIBRE 2 SENSOR) MISC APPLY EVERY 14 DAYS, CHECK GLUCOSE BEFORE EACH MEAL, 2 HRS AFTER, IN THE MORNING AND BEFORE BEDTIME 02/26/21    [name], MD  ? ? ?Physical Exam: ?Vitals:  ? 05/26/21 0515 05/26/21 0530 05/26/21 0600 05/26/21 0630  ?BP:  123/81 (!) 143/68 136/67  ?Pulse: (!) 28   99  ?Resp: (!) 26 (!) 25 18 (!) 21  ?Temp:    98.8 ?F (37.1 ?C)  ?TempSrc:    Oral  ?SpO2: 100%   98%  ?Weight:      ? ?Exam ? ?Constitutional: Elderly male who appears to be ill, but in no acute distress at this time ?Eyes: PERRL, lids and conjunctivae normal ?ENMT: Mucous membranes are moist.  Thrush noted of the posterior oropharynx. ?Neck: normal, supple, no JVD appreciated ?Respiratory: Normal respiratory effort with some crackles appreciated.  O2 saturation currently maintained on room air. ?Cardiovascular: Regular rate and rhythm, no murmurs / rubs / gallops.  1+ pitting lower extremity edema.  ?Abdomen: no tenderness,  no masses palpated. No hepatosplenomegaly. Bowel sounds positive.  ?Musculoskeletal: no clubbing / cyanosis.  Tenderness palpation of the left hip. ?Skin: no rashes, lesions, ulcers. No induration ?Neurologic: CN 2-12 grossly intact.  Strength 4/5 in all extremities. ?Psychiatric: Confused oriented to person and place, but not time. ? ?Data Reviewed: ? ?EKG reveals sinus tachycardia 112 bpm ? ?Assessment and Plan: ? ?Sepsis secondary to urinary tract infection ?Acute.  Patient presented febrile up to 103.4 ?F with tachycardia, tachypnea, and WBC elevated at 12.5 meeting SIRS criteria.  Urinalysis was concerning for infection with large hemoglobin, large leukocytes, positive nitrites, many bacteria, greater than 50 RBCs/hpf, and greater than 50 WBCs.  Blood cultures and urine cultures had been obtained.  Patient had been started on empiric antibiotics of vancomycin and cefepime.  Previous urine culture from 11/2020 have been positive for pansensitive E. coli ?-Admit to a telemetry bed ?-Follow-up blood and urine cultures ?-Add on APTT and INR as no other clear signs of endorgan damage appreciated ?-Transitioned antibiotics to Rocephin  IV ? ?Acute metabolic encephalopathy ?Patient was noted to be confused by his wife here recently over the last few days and had gotten to the point in which she was unable to talk.  CT scan of the head did not not

## 2021-05-26 NOTE — ED Notes (Signed)
Attempted in and out for urine sample.  Pt with hx of prostate cancer.  Unable to advance catheter past prostate.  Patient moaning in discomfort.  Unable to obtain sample.  Condom catheter placed.  PA made aware ?

## 2021-05-27 DIAGNOSIS — N390 Urinary tract infection, site not specified: Secondary | ICD-10-CM | POA: Diagnosis not present

## 2021-05-27 DIAGNOSIS — A419 Sepsis, unspecified organism: Secondary | ICD-10-CM | POA: Diagnosis not present

## 2021-05-27 LAB — CBC
HCT: 22.9 % — ABNORMAL LOW (ref 39.0–52.0)
Hemoglobin: 7.6 g/dL — ABNORMAL LOW (ref 13.0–17.0)
MCH: 29.8 pg (ref 26.0–34.0)
MCHC: 33.2 g/dL (ref 30.0–36.0)
MCV: 89.8 fL (ref 80.0–100.0)
Platelets: 245 10*3/uL (ref 150–400)
RBC: 2.55 MIL/uL — ABNORMAL LOW (ref 4.22–5.81)
RDW: 21.8 % — ABNORMAL HIGH (ref 11.5–15.5)
WBC: 9.7 10*3/uL (ref 4.0–10.5)
nRBC: 0.3 % — ABNORMAL HIGH (ref 0.0–0.2)

## 2021-05-27 LAB — GLUCOSE, CAPILLARY
Glucose-Capillary: 156 mg/dL — ABNORMAL HIGH (ref 70–99)
Glucose-Capillary: 131 mg/dL — ABNORMAL HIGH (ref 70–99)
Glucose-Capillary: 175 mg/dL — ABNORMAL HIGH (ref 70–99)
Glucose-Capillary: 172 mg/dL — ABNORMAL HIGH (ref 70–99)

## 2021-05-27 LAB — COMPREHENSIVE METABOLIC PANEL
ALT: 19 U/L (ref 0–44)
AST: 54 U/L — ABNORMAL HIGH (ref 15–41)
Albumin: 1.6 g/dL — ABNORMAL LOW (ref 3.5–5.0)
Alkaline Phosphatase: 365 U/L — ABNORMAL HIGH (ref 38–126)
Anion gap: 8 (ref 5–15)
BUN: 15 mg/dL (ref 8–23)
CO2: 24 mmol/L (ref 22–32)
Calcium: 7.8 mg/dL — ABNORMAL LOW (ref 8.9–10.3)
Chloride: 106 mmol/L (ref 98–111)
Creatinine, Ser: 0.57 mg/dL — ABNORMAL LOW (ref 0.61–1.24)
GFR, Estimated: >60 mL/min (ref >60)
Glucose, Bld: 194 mg/dL — ABNORMAL HIGH (ref 70–99)
Potassium: 3.7 mmol/L (ref 3.5–5.1)
Sodium: 138 mmol/L (ref 135–145)
Total Bilirubin: 0.8 mg/dL (ref 0.3–1.2)
Total Protein: 5.1 g/dL — ABNORMAL LOW (ref 6.5–8.1)

## 2021-05-27 LAB — PREALBUMIN: Prealbumin: <5 mg/dL — ABNORMAL LOW (ref 18–38)

## 2021-05-27 MED ORDER — LACTATED RINGERS IV SOLN: INTRAVENOUS | Status: DC

## 2021-05-27 NOTE — TOC Initial Note (Signed)
Transition of Care (TOC) - Initial/Assessment Note  ? ? ?Patient Details  ?Name: Stephen Lynch ?MRN: [id number] ?Date of Birth: May 07, 1945 ? ?Transition of Care (TOC) CM/SW Contact:    ?Tawanna Cooler, RN ?Phone Number: ?05/27/2021, 2:30 PM ? ?Clinical Narrative:                 ? ?Patient from home.  Hx of cancer, receiving chemo.  TOC following for discharge needs.  ? ? ?Expected Discharge Plan: Home/Self Care ?Barriers to Discharge: Continued Medical Work up ? ? ?Expected Discharge Plan and Services ?Expected Discharge Plan: Home/Self Care ?  ?   ?Living arrangements for the past 2 months: [place] ?                ?  ?Prior Living Arrangements/Services ?Living arrangements for the past 2 months: [place] ?Lives with:: Significant Other ?Patient language and need for interpreter reviewed:: Yes ?       ?Need for Family Participation in Patient Care: Yes (Comment) ?Care giver support system in place?: Yes (comment) ?  ?Criminal Activity/Legal Involvement Pertinent to Current Situation/Hospitalization: No - Comment as needed ? ?Activities of Daily Living ?Home Assistive Devices/Equipment: Cane (specify quad or straight) ?ADL Screening (condition at time of admission) ?Patient's cognitive ability adequate to safely complete daily activities?: Yes ?Is the patient deaf or have difficulty hearing?: No ?Does the patient have difficulty seeing, even when wearing glasses/contacts?: No ?Does the patient have difficulty concentrating, remembering, or making decisions?: Yes ?Patient able to express need for assistance with ADLs?: Yes ?Does the patient have difficulty dressing or bathing?: No ?Independently performs ADLs?: Yes (appropriate for developmental age) ?Does the patient have difficulty walking or climbing stairs?: No ?Weakness of Legs: Both (feet) ?Weakness of Arms/Hands: None ? ?Emotional Assessment ? Alcohol / Substance Use: Not Applicable ?Psych Involvement: No (comment) ? ?Admission diagnosis:   Acute cystitis with hematuria [N30.01] ?Sepsis ([place]) [A41.9] ?Fever, unspecified fever cause [R50.9] ?Altered mental status, unspecified altered mental status type [R41.82] ?Patient Active Problem List  ? Diagnosis Date Noted  ? Sepsis secondary to UTI ([place]) 05/26/2021  ? Thrush of mouth and esophagus ([place]) 05/26/2021  ? Lower extremity edema 05/26/2021  ? GERD (gastroesophageal reflux disease) 05/26/2021  ? Malignant neoplasm of prostate metastatic to bone ([place]) 03/23/2021  ? Uncontrolled type 2 diabetes mellitus with hyperglycemia, with long-term current use of insulin ([place]) 02/16/2021  ? Complicated UTI (urinary tract infection) 12/07/2020  ? Acute metabolic encephalopathy 65/46/5035  ? Goals of care, counseling/discussion 05/26/2020  ? Prostate cancer ([place]) 04/13/2019  ? Back pain 03/23/2019  ? Symptomatic anemia 03/23/2019  ? COVID-19 virus infection 03/23/2019  ? Dilation of biliary tract 03/23/2019  ? COPD (chronic obstructive pulmonary disease) ([place]) 03/23/2019  ? Hypertension   ? Dyslipidemia   ? Diabetes mellitus without complication ([place])   ? ?PCP:  [facility] ?Pharmacy:   ?[id number] [address]?Phone: [phone]: [phone]? ?[place] [place]?Phone: [phone]: [phone]? ?[facility] [address]?Phone: [phone]: [phone]? ? ?Readmission Risk Interventions ? ?  05/27/2021  ?  2:29 PM  ?Readmission Risk Prevention Plan  ?Transportation Screening Complete  ?PCP or Specialist Appt  within 3-5 Days Complete  ?[place] or Home Care Consult Complete  ? ? ? ?

## 2021-05-27 NOTE — Plan of Care (Signed)
  Problem: Clinical Measurements: Goal: Respiratory complications will improve Outcome: Progressing   Problem: Activity: Goal: Risk for activity intolerance will decrease Outcome: Progressing   

## 2021-05-27 NOTE — Evaluation (Signed)
Physical Therapy Evaluation ?Patient Details ?Name: Stephen Lynch ?MRN: [id number] ?DOB: [birth year] ?Today's Date: 05/27/2021 ? ?History of Present Illness ? Patient is a 76 year old male who presented to the hosptial with confusion, fever and weakness. patient was found to have sepsis secondary to UTI. PMH: advanced pancreatic CA with bone metastases  (diffusely sclerotic bones in the cervical spine consistent with osteoblastic metastases), DM, HTN, HLD, anemia  ?Clinical Impression ? Pt admitted with above diagnosis.  ?See below for mobility. Pt reports he is normally independent. He was having some confusion however alert and oriented at time of PT eval. Recommend HHPT ? Pt currently with functional limitations due to the deficits listed below (see PT Problem List). Pt will benefit from skilled PT to increase their independence and safety with mobility to allow discharge to the venue listed below.   ?   ?   ? ?Recommendations for follow up therapy are one component of a multi-disciplinary discharge planning process, led by the attending physician.  Recommendations may be updated based on patient status, additional functional criteria and insurance authorization. ? ?Follow Up Recommendations Home health PT ? ?  ?Assistance Recommended at Discharge Intermittent Supervision/Assistance  ?Patient can return home with the following ? Help with stairs or ramp for entrance;Assist for transportation;Assistance with cooking/housework ? ?  ?Equipment Recommendations Rolling walker (2 wheels)  ?Recommendations for Other Services ?    ?  ?Functional Status Assessment Patient has had a recent decline in their functional status and demonstrates the ability to make significant improvements in function in a reasonable and predictable amount of time.  ? ?  ?Precautions / Restrictions Precautions ?Precautions: Fall ?Restrictions ?Weight Bearing Restrictions: No  ? ?  ? ?Mobility ? Bed Mobility ?Overal bed mobility: Needs  Assistance ?Bed Mobility: Sit to Supine ?  ?  ?  ?Sit to supine: Min assist, Mod assist ?  ?General bed mobility comments: assist to lift LEs on to bed, incr time ?  ? ?Transfers ?Overall transfer level: Needs assistance ?Equipment used: Rolling walker (2 wheels) ?Transfers: Sit to/from Stand ?Sit to Stand: Min assist, Min guard ?  ?  ?  ?  ?  ?General transfer comment: cues for safety and hand placement ?  ? ?Ambulation/Gait ?Ambulation/Gait assistance: Min assist, Min guard ?Gait Distance (Feet): 160 Feet ?Assistive device: Rolling walker (2 wheels) ?Gait Pattern/deviations: Step-through pattern, Decreased stride length, Trunk flexed ?Gait velocity: decr ?  ?  ?General Gait Details: cues for RW maneuvering and safety. pt unsteady initially, improved with distance ? ?Stairs ?  ?  ?  ?  ?  ? ?Wheelchair Mobility ?  ? ?Modified Rankin (Stroke Patients Only) ?  ? ?  ? ?Balance Overall balance assessment: Mild deficits observed, not formally tested ?  ?  ?  ?  ?  ?  ?  ?  ?  ?  ?  ?  ?  ?  ?  ?  ?  ?  ?   ? ? ? ?Pertinent Vitals/Pain Pain Assessment ?Pain Assessment: No/denies pain  ? ? ?Home Living Family/patient expects to be discharged to:: Private residence ?Living Arrangements: Spouse/significant other ?Available Help at Discharge: Available 24 hours/day ?Type of Home: House ?Home Access: Stairs to enter ?Entrance Stairs-Rails: None ?Entrance Stairs-Number of Steps: 1 ?Alternate Level Stairs-Number of Steps: 14 ?Home Layout: Two level;Bed/bath upstairs;1/2 bath on main level;Able to live on main level with bedroom/bathroom ?Home Equipment: Kasandra Knudsen - single point;Shower seat;Grab bars - tub/shower ?   ?  ?  Prior Function Prior Level of Function : Independent/Modified Independent ?  ?  ?  ?  ?  ?  ?Mobility Comments: Normally ambulates without AD in community and performs stairs without difficulty ?  ?  ? ? ?Hand Dominance  ?   ? ?  ?Extremity/Trunk Assessment  ? Upper Extremity Assessment ?Upper Extremity Assessment:  Overall WFL for tasks assessed ?  ? ?Lower Extremity Assessment ?Lower Extremity Assessment: Overall WFL for tasks assessed ?  ? ?   ?Communication  ? Communication: No difficulties  ?Cognition Arousal/Alertness: Awake/alert ?Behavior During Therapy: [facility] for tasks assessed/performed ?Overall Cognitive Status: Difficult to assess ?  ?  ?  ?  ?  ?  ?  ?  ?  ?  ?  ?  ?  ?  ?  ?  ?General Comments: patient was oriented to self, date and place on this date, answers most PLOF appropriately (matching previous info from recent [place]) ?  ?  ? ?  ?General Comments   ? ?  ?Exercises    ? ?Assessment/Plan  ?  ?PT Assessment Patient needs continued PT services  ?PT Problem List Decreased activity tolerance;Decreased knowledge of use of DME;Decreased mobility ? ?   ?  ?PT Treatment Interventions DME instruction;Therapeutic exercise;Gait training;Functional mobility training;Therapeutic activities;Patient/family education   ? ?PT Goals (Current goals can be found in the Care Plan section)  ?Acute Rehab PT Goals ?Patient Stated Goal: feel better ?PT Goal Formulation: With patient ?Time For Goal Achievement: 06/10/21 ?Potential to Achieve Goals: Good ? ?  ?Frequency Min 3X/week ?  ? ? ?Co-evaluation   ?  ?  ?  ?  ? ? ?  ?AM-PAC PT "6 Clicks" Mobility  ?Outcome Measure Help needed turning from your back to your side while in a flat bed without using bedrails?: A Little ?Help needed moving from lying on your back to sitting on the side of a flat bed without using bedrails?: A Little ?Help needed moving to and from a bed to a chair (including a wheelchair)?: A Little ?Help needed standing up from a chair using your arms (e.g., wheelchair or bedside chair)?: A Little ?Help needed to walk in hospital room?: A Little ?Help needed climbing 3-5 steps with a railing? : A Lot ?6 Click Score: 17 ? ?  ?End of Session Equipment Utilized During Treatment: Gait belt ?Activity Tolerance: Patient tolerated treatment well ?Patient left: in  bed;with call bell/phone within reach;with bed alarm set ?  ?PT Visit Diagnosis: Other abnormalities of gait and mobility (R26.89);Difficulty in walking, not elsewhere classified (R26.2) ?  ? ?Time: 1211-1223 ?PT Time Calculation (min) (ACUTE ONLY): 12 min ? ? ?Charges:   PT Evaluation ?$PT Eval Low Complexity: 1 Low ?  ?  ?   ? ? ?Baxter Flattery, PT ? ?Acute Rehab Dept [facility]) [phone] ?Pager [phone] ? ?05/27/2021 ? ? ?Anberlyn Feimster ?05/27/2021, 4:42 PM ? ?

## 2021-05-27 NOTE — Progress Notes (Signed)
?PROGRESS NOTE ? ? ? ?Stephen Lynch  DJT:[id number] DOB: Apr 28, 1945 DOA: 05/25/2021 ?PCP: [facility]  ? ? ?Brief Narrative:  ?76 y.o. male with medical history significant of hypertension, dyslipidemia, diabetes mellitus type 2, and metastatic prostate cancer on chemotherapy who presented last night after his wife found him to be significantly weak and confused with fever.  He had received his last chemotherapy of  Jevtana and Eligard was approximately 2 weeks ago, and his wife notes that he was scheduled to have infusion on 4/20.  This week she noticed that the patient had decreased appetite with complaints of difficulty of swallowing.  He was only eating mashed potatoes and pickled pig feet.  Patient complained of having leg swelling, left hip pain, and some discomfort with urination.  He reported having some nausea and vomiting a couple days ago.  Wife notes that he is normally alert and oriented x3, but was more confused and seemed out of it.  Yesterday, patient got to the point where he was unable to walk, could not talk, and seemed to have fever for which his wife called EMS.  In route with EMS fever was reported to be up to 102 ?F with heart rates into the 140s, and he was given 200 mg liters of normal saline IV fluids.  His wife also notes that he was started on oral nystatin yesterday. ?  ?Upon admission into the emergency department patient was noted to be febrile up to 103.4 ?F with tachycardia and tachypnea and all other vital signs maintained.  Labs significant for 12.5, hemoglobin 8.2, alkaline phosphatase 391, AST 75, albumin 2.2, lactic acid 1.6.  Chest x-ray noted chronic interstitial prominence consistent with scarring, and no other acute abnormality.  Influenza, COVID-19, and complete respiratory virus panel ordered noted to be negative.  Blood and urine cultures have been obtained.  CT scan of the head and cervical spine noted no acute intercranial process and diffusely sclerotic  bones in the cervical spine consistent with osteoblastic metastases.  Urinalysis was positive for large hemoglobin, large leukocytes, positive nitrites, many bacteria, greater than 50 RBCs/hpf, and greater than 50 WBCs.  Patient has been given 1 L normal saline IV fluids, vancomycin, cefepime, and Tylenol 650 mg rectally. ? ?4/16: Mental status improving.  Patient alert and oriented x3 albeit with some prompting for time and place.  No fevers over interval. ? ? ?Assessment & Plan: ?  ?Principal Problem: ?  Sepsis secondary to UTI [facility]) ?Active Problems: ?  Acute metabolic encephalopathy ?  Uncontrolled type 2 diabetes mellitus with hyperglycemia, with long-term current use of insulin ([place]) ?  Malignant neoplasm of prostate metastatic to bone [facility]) ?  Thrush of mouth and esophagus ([place]) ?  Lower extremity edema ?  GERD (gastroesophageal reflux disease) ? ?Sepsis secondary to urinary tract infection ?Sepsis criteria met with fever, tachycardia, tachypnea, leukocytosis ?UA consistent with infection ?Blood and urine cultures obtained on admission. ?Received empiric vancomycin/cefepime on admission in ED ?Antibiotics tailored using previous urine culture ?Plan: ?Continue Rocephin ?Follow blood and urine cultures ?Monitor vitals and fever curve ?Low rate IV fluids ? ?Acute metabolic encephalopathy ?Likely in the setting of acute urinary tract infection ?CT head negative ?Oriented x3 on my evaluation ?Plan: ?Frequent neurochecks ?Frequent reorienting measures ?Avoid sedatives, anticholinergics, benzodiazepines ? ?Weakness ?Functional decline ?Patient's ability to ambulate recently has been limited ?Plan: ?Therapy evaluations requested ? ?Oral candidiasis ?Difficulty swallowing associated with white plaques on upper palate ?Plan: ?Nystatin swish and swallow (patient prescribed  a 14-day course as outpatient) ? ?Normocytic anemia ?Appears chronic, around patient's baseline ?Likely chronic anemia in the setting of malignancy  and multiple chronic medical comorbidities ?No indication for transfusion ? ?Type 2 diabetes mellitus uncontrolled with hyperglycemia ?Basal bolus regimen ?Resistant sliding scale insulin ?CBGs before every meal and at bedtime ?Hypoglycemia protocol ? ?Prostate cancer with metastases to bone ?Outpatient oncology follow-up ?Pain control as needed ? ?COPD without evidence of exacerbation ?No wheezing or rhonchorous breath sounds ?As needed albuterol nebulized ? ?Protein calorie malnutrition ?RD consult placed for qualification ?Continue with Glucerna shakes ? ?Bilateral lower extremity edema ?Suspect in the setting of chronic malnutrition ?Elevate lower extremities ?RD consult as above ? ?GERD ?Pepcid ? ? ?DVT prophylaxis: SQ Lovenox ?Code Status: Full ?Family Communication: Significant other Milas Gain [phone] on 4/16 ?Disposition Plan: Status is: Inpatient ?Remains inpatient appropriate because: UTI with associated delirium and weakness/functional decline.  Possible disposition in 24 to 48 hours. ? ? ?Level of care: Telemetry ? ?Consultants:  ?None ? ?Procedures:  ?None ? ?Antimicrobials: ?Ceftriaxone ? ? ?Subjective: ?Seen and examined.  Appears fatigued but no specific distress.  Alert and oriented x3 ? ?Objective: ?Vitals:  ? 05/26/21 1604 05/26/21 1723 05/26/21 2022 05/27/21 0505  ?BP: 138/69  133/66 (!) 145/52  ?Pulse: 83  93 88  ?Resp:   18 18  ?Temp: 97.7 ?F (36.5 ?C)  99.4 ?F (37.4 ?C) 98.4 ?F (36.9 ?C)  ?TempSrc: Oral  Oral   ?SpO2: 100%  100% 100%  ?Weight:  87.5 kg    ?Height:  _0  (1.854 m)    ? ? ?Intake/Output Summary (Last 24 hours) at 05/27/2021 2585 ?Last data filed at 05/27/2021 0211 ?Gross per 24 hour  ?Intake 100 ml  ?Output 1800 ml  ?Net -1700 ml  ? ?Filed Weights  ? 05/25/21 2307 05/26/21 1723  ?Weight: 87.5 kg 87.5 kg  ? ? ?Examination: ? ?General exam: No acute distress.  Appears chronically ill ?Respiratory system: Lungs clear.  Normal work of breathing.  Room air ?Cardiovascular  system: S1-S2, tachycardic, regular rhythm, no murmurs ?Gastrointestinal system: Soft, thin, NT/ND, normal bowel sounds ?Central nervous system: Lethargic but alert and oriented x3.  No focal deficits ?Extremities: Diffusely decreased power bilaterally ?Skin: No rashes, lesions or ulcers ?Psychiatry: Judgement and insight appear normal. Mood & affect flattened.  ? ? ? ?Data Reviewed: I have personally reviewed following labs and imaging studies ? ?CBC: ?Recent Labs  ?Lab 05/25/21 ?2312 05/27/21 ?2778  ?WBC 12.5* 9.7  ?NEUTROABS 10.7*  --   ?HGB 8.2* 7.6*  ?HCT 26.4* 22.9*  ?MCV 92.3 89.8  ?PLT 246 245  ? ?Basic Metabolic Panel: ?Recent Labs  ?Lab 05/25/21 ?2312 05/27/21 ?2423  ?NA 139 138  ?K 3.7 3.7  ?CL 105 106  ?CO2 24 24  ?GLUCOSE 167* 194*  ?BUN 13 15  ?CREATININE 0.70 0.57*  ?CALCIUM 8.0* 7.8*  ? ?GFR: ?Estimated Creatinine Clearance: 91.6 mL/min (A) (by C-G formula based on SCr of 0.57 mg/dL (L)). ?Liver Function Tests: ?Recent Labs  ?Lab 05/25/21 ?2312 05/27/21 ?5361  ?AST 75* 54*  ?ALT 19 19  ?ALKPHOS 391* 365*  ?BILITOT 0.6 0.8  ?PROT 6.1* 5.1*  ?ALBUMIN 2.2* 1.6*  ? ?No results for input(s): LIPASE, AMYLASE in the last 168 hours. ?No results for input(s): AMMONIA in the last 168 hours. ?Coagulation Profile: ?Recent Labs  ?Lab 05/26/21 ?1215  ?INR 1.8*  ? ?Cardiac Enzymes: ?No results for input(s): CKTOTAL, CKMB, CKMBINDEX, TROPONINI in the last 168 hours. ?BNP (  last 3 results) ?No results for input(s): PROBNP in the last 8760 hours. ?HbA1C: ?No results for input(s): HGBA1C in the last 72 hours. ?CBG: ?Recent Labs  ?Lab 05/26/21 ?1226 05/26/21 ?1638 05/26/21 ?2028  ?GLUCAP 153* 140* 170*  ? ?Lipid Profile: ?No results for input(s): CHOL, HDL, LDLCALC, TRIG, CHOLHDL, LDLDIRECT in the last 72 hours. ?Thyroid Function Tests: ?No results for input(s): TSH, T4TOTAL, FREET4, T3FREE, THYROIDAB in the last 72 hours. ?Anemia Panel: ?No results for input(s): VITAMINB12, FOLATE, FERRITIN, TIBC, IRON, RETICCTPCT in  the last 72 hours. ?Sepsis Labs: ?Recent Labs  ?Lab 05/25/21 ?2312  ?LATICACIDVEN 1.6  ? ? ?Recent Results (from the past 240 hour(s))  ?Blood culture (routine x 2)     Status: None (Preliminary result)  ? C

## 2021-05-27 NOTE — Evaluation (Signed)
Occupational Therapy Evaluation ?Patient Details ?Name: Stephen Lynch ?MRN: [id number] ?DOB: 01/31/1946 ?Today's Date: 05/27/2021 ? ? ?History of Present Illness Patient is a 76 year old male who presented to the hosptial with confusion, fever and weakness. patient was found to have sepsis secondary to UTI. PMH: advanced pancreatic CA with bone metastases, DM, HTN, HLD, anemia  ? ?Clinical Impression ?  ?Patient is a 76 year old male who was admitted for above. Patient was noted to have decreased safety awareness and decreased functional activity tolerance impacting participation in ADLs. Patient was max A for LB dress/bathing tasks on this date with min A for standing toileting hygiene with RW.Patient would continue to benefit from skilled OT services at this time while admitted and after d/c to address noted deficits in order to improve overall safety and independence in ADLs.  ?  ?   ? ?Recommendations for follow up therapy are one component of a multi-disciplinary discharge planning process, led by the attending physician.  Recommendations may be updated based on patient status, additional functional criteria and insurance authorization.  ? ?Follow Up Recommendations ? Home health OT  ?  ?Assistance Recommended at Discharge Frequent or constant Supervision/Assistance  ?Patient can return home with the following A little help with walking and/or transfers;A little help with bathing/dressing/bathroom;Assistance with cooking/housework;Direct supervision/assist for financial management;Assist for transportation;Help with stairs or ramp for entrance;Direct supervision/assist for medications management ? ?  ?Functional Status Assessment ? Patient has had a recent decline in their functional status and demonstrates the ability to make significant improvements in function in a reasonable and predictable amount of time.  ?Equipment Recommendations ? None recommended by OT  ?  ?Recommendations for Other Services   ? ? ?   ?Precautions / Restrictions Precautions ?Precautions: Fall ?Restrictions ?Weight Bearing Restrictions: No  ? ?  ? ?Mobility Bed Mobility ?Overal bed mobility: Needs Assistance ?Bed Mobility: Supine to Sit ?  ?  ?Supine to sit: Min assist ?  ?  ?General bed mobility comments: with physical assist to bring BLE over to edge of bed and square up on edge of bed with patient eager to get to bathroom. ?  ? ?Transfers ?  ?  ?  ?  ?  ?  ?  ?  ?  ?  ?  ? ?  ?Balance Overall balance assessment: Mild deficits observed, not formally tested ?  ?  ?  ?  ?  ?  ?  ?  ?  ?  ?  ?  ?  ?  ?  ?  ?  ?  ?   ? ?ADL either performed or assessed with clinical judgement  ? ?ADL Overall ADL's : Needs assistance/impaired ?Eating/Feeding: Set up;Sitting ?  ?Grooming: Dance movement psychotherapist;Wash/dry hands;Set up;Sitting ?  ?Upper Body Bathing: Sitting;Set up ?  ?Lower Body Bathing: Sit to/from stand;Sitting/lateral leans;Maximal assistance ?  ?Upper Body Dressing : Set up;Sitting ?  ?Lower Body Dressing: Maximal assistance;Sit to/from stand ?  ?Toilet Transfer: Minimal assistance;Rolling walker (2 wheels);BSC/3in1 ?  ?Toileting- Water quality scientist and Hygiene: Min guard;Sit to/from stand ?Toileting - Clothing Manipulation Details (indicate cue type and reason): with increased time and set up for wash cloths. ?  ?  ?  ?   ? ? ? ?Vision Patient Visual Report: No change from baseline ?   ?   ?Perception   ?  ?Praxis   ?  ? ?Pertinent Vitals/Pain Pain Assessment ?Pain Assessment: No/denies pain  ? ? ? ?Hand Dominance Right ?  ?  Extremity/Trunk Assessment Upper Extremity Assessment ?Upper Extremity Assessment: Overall WFL for tasks assessed ?  ?Lower Extremity Assessment ?Lower Extremity Assessment: Defer to PT evaluation ?  ?Cervical / Trunk Assessment ?Cervical / Trunk Assessment: Kyphotic ?  ?Communication Communication ?Communication: No difficulties ?  ?Cognition Arousal/Alertness: Awake/alert ?Behavior During Therapy: [facility] for tasks  assessed/performed ?Overall Cognitive Status: Difficult to assess ?  ?  ?  ?  ?  ?  ?  ?  ?  ?  ?  ?  ?  ?  ?  ?  ?General Comments: patient was oriented to self, date and place on this date. ?  ?  ?General Comments    ? ?  ?Exercises   ?  ?Shoulder Instructions    ? ? ?Home Living Family/patient expects to be discharged to:: Private residence ?Living Arrangements: Spouse/significant other ?Available Help at Discharge: Available 24 hours/day ?Type of Home: House ?Home Access: Stairs to enter ?Entrance Stairs-Number of Steps: 1 ?Entrance Stairs-Rails: None ?Home Layout: Two level;Bed/bath upstairs;1/2 bath on main level;Able to live on main level with bedroom/bathroom ?Alternate Level Stairs-Number of Steps: 14 ?Alternate Level Stairs-Rails: Right ?Bathroom Shower/Tub: Tub/shower unit ?  ?Bathroom Toilet: Standard ?Bathroom Accessibility: Yes ?  ?Home Equipment: Kasandra Knudsen - single point;Shower seat;Grab bars - tub/shower ?  ?  ?  ? ?  ?Prior Functioning/Environment Prior Level of Function : Independent/Modified Independent ?  ?  ?  ?  ?  ?  ?Mobility Comments: Normally ambulates without AD in community and performs stairs without difficulty ?ADLs Comments: Reports independent with ADLs (uses shower chair); Reports could do IADLs. PLOF was taken from chart ?  ? ?  ?  ?OT Problem List: Decreased activity tolerance;Impaired balance (sitting and/or standing);Decreased safety awareness;Decreased knowledge of precautions;Decreased knowledge of use of DME or AE ?  ?   ?OT Treatment/Interventions: Self-care/ADL training;Therapeutic exercise;Neuromuscular education;Energy conservation;DME and/or AE instruction;Therapeutic activities;Balance training;Patient/family education  ?  ?OT Goals(Current goals can be found in the care plan section) Acute Rehab OT Goals ?Patient Stated Goal: to go to the bathroom ?OT Goal Formulation: With patient ?Time For Goal Achievement: 06/10/21 ?Potential to Achieve Goals: Fair  ?OT Frequency: Min  2X/week ?  ? ?Co-evaluation   ?  ?  ?  ?  ? ?  ?AM-PAC OT "6 Clicks" Daily Activity     ?Outcome Measure Help from another person eating meals?: A Little ?Help from another person taking care of personal grooming?: A Little ?Help from another person toileting, which includes using toliet, bedpan, or urinal?: A Little ?Help from another person bathing (including washing, rinsing, drying)?: A Lot ?Help from another person to put on and taking off regular upper body clothing?: A Little ?Help from another person to put on and taking off regular lower body clothing?: A Lot ?6 Click Score: 16 ?  ?End of Session Equipment Utilized During Treatment: Gait belt;Rolling walker (2 wheels) ?Nurse Communication: Other (comment) (ok to participate in session) ? ?Activity Tolerance: Patient tolerated treatment well ?Patient left: in chair;with call bell/phone within reach;with chair alarm set ? ?OT Visit Diagnosis: Unsteadiness on feet (R26.81);Muscle weakness (generalized) (M62.81)  ?              ?Time: 1030-1053 ?OT Time Calculation (min): 23 min ?Charges:  OT General Charges ?$OT Visit: 1 Visit ?OT Evaluation ?$OT Eval Moderate Complexity: 1 Mod ?OT Treatments ?$Self Care/Home Management : 8-22 mins ? ?Yeison Sippel OTR/L, MS ?Acute Rehabilitation Department ?Office# [phone] ?Pager# [phone] ? ? ?[place] ?  05/27/2021, 12:24 PM ?

## 2021-05-28 DIAGNOSIS — N390 Urinary tract infection, site not specified: Secondary | ICD-10-CM | POA: Diagnosis not present

## 2021-05-28 DIAGNOSIS — G9341 Metabolic encephalopathy: Secondary | ICD-10-CM | POA: Diagnosis not present

## 2021-05-28 DIAGNOSIS — A419 Sepsis, unspecified organism: Secondary | ICD-10-CM | POA: Diagnosis not present

## 2021-05-28 LAB — URINE CULTURE: Culture: 30000 — AB

## 2021-05-28 LAB — GLUCOSE, CAPILLARY
Glucose-Capillary: 141 mg/dL — ABNORMAL HIGH (ref 70–99)
Glucose-Capillary: 170 mg/dL — ABNORMAL HIGH (ref 70–99)
Glucose-Capillary: 218 mg/dL — ABNORMAL HIGH (ref 70–99)
Glucose-Capillary: 169 mg/dL — ABNORMAL HIGH (ref 70–99)

## 2021-05-28 MED ORDER — PROSOURCE PLUS PO LIQD
30.0000 mL | Freq: Every day | ORAL | Status: DC
  Administered 2021-05-28 – 2021-05-29 (×2): 30 mL via ORAL
  Filled 2021-05-28 (×3): qty 30

## 2021-05-28 MED ORDER — BOOST / RESOURCE BREEZE PO LIQD CUSTOM
1.0000 | Freq: Two times a day (BID) | ORAL | Status: DC
  Administered 2021-05-29 – 2021-05-30 (×3): 1 via ORAL

## 2021-05-28 MED ORDER — MEGESTROL ACETATE 400 MG/10ML PO SUSP
400.0000 mg | Freq: Two times a day (BID) | ORAL | Status: DC
  Administered 2021-05-28 – 2021-06-01 (×9): 400 mg via ORAL
  Filled 2021-05-28 (×9): qty 10

## 2021-05-28 MED ORDER — FLUCONAZOLE 100 MG PO TABS
200.0000 mg | ORAL_TABLET | Freq: Every day | ORAL | Status: DC
  Administered 2021-05-28 – 2021-06-01 (×5): 200 mg via ORAL
  Filled 2021-05-28 (×5): qty 2

## 2021-05-28 MED ORDER — CEFADROXIL 500 MG PO CAPS
500.0000 mg | ORAL_CAPSULE | Freq: Two times a day (BID) | ORAL | Status: DC
  Administered 2021-05-29 – 2021-06-01 (×7): 500 mg via ORAL
  Filled 2021-05-28 (×7): qty 1

## 2021-05-28 MED ORDER — ADULT MULTIVITAMIN W/MINERALS CH
1.0000 | ORAL_TABLET | Freq: Every day | ORAL | Status: DC
  Administered 2021-05-28 – 2021-06-01 (×5): 1 via ORAL
  Filled 2021-05-28 (×5): qty 1

## 2021-05-28 NOTE — Plan of Care (Signed)
  Problem: Clinical Measurements: Goal: Respiratory complications will improve Outcome: Progressing   Problem: Activity: Goal: Risk for activity intolerance will decrease Outcome: Progressing   Problem: Coping: Goal: Level of anxiety will decrease Outcome: Progressing   

## 2021-05-28 NOTE — Progress Notes (Addendum)
?[facility] Progress Note ? ?Patient: Stephen Lynch     ?WEX:[id number]  ?DOA: 05/25/2021   ?PCP: [facility]  ? ?  ?  ?Brief hospital course: ?This is a 76 year old male with diabetes mellitus type 2, hypertension and dyslipidemia who has metastatic prostate cancer on chemotherapy and was brought to the hospital by his wife for poor oral intake for the past few days and a fever. ?Patient was noted to have a temperature of 102 when checked by EMS and a heart rate in the 140s.  He received IV fluid boluses.  In the ED temperature was 103.4.  Blood and urine cultures were obtained.  UA was consistent with a UTI and he was started on broad-spectrum antibiotics. ? ?Subjective:  ?Has no appetite and feels very weak. ?Assessment and Plan: ?Principal Problem: ?  Sepsis  ?- Etiology remains undetermined ?- Urine has grown out 30,000 colonies of E. coli ?- He initially received cefepime and vancomycin and then on 4/15 was transitioned to ceftriaxone-I would favor completing a 7-day course to treat for possible UTI ?- No recurrence of fevers noted however, the patient is also receiving Tylenol which may be suppressing fevers ?-Cultures have been negative, respiratory panel was negative, influenza and COVID PCRs negative ? ?Active Problems: ?  Acute metabolic encephalopathy ?-Secondary to fever ?- The patient appeared oriented to me today ? ?Poor oral intake & severe hypoalbuminemia secondary to underlying metastatic cancer ?-Albumin level is 1.6 ?- He states that loss of appetite occurred after radiation treatment and he has not yet tried an appetite stimulant ?- I have started Megace ?- Continue to follow oral intake closely-I do not feel he is stable to discharge until I see his oral intake improve ? ?Thrush ?-?  If he may have a esophageal candidiasis which is limiting his ability to eat ?-He has been receiving nystatin however he still has thick plaques of yellow on his tongue-I will start Diflucan  today and follow ? ?  Uncontrolled type 2 diabetes mellitus with hyperglycemia, with long-term current use of insulin ([place]) ?   ?Component Value Date/Time  ? HGBA1C 8.4 (H) 05/14/2021 1112  ?-Continue Semglee and insulin sliding scale ? ?  Malignant neoplasm of prostate metastatic to bone [facility]) ?-Receives chemotherapy (Dr. Alen Blew) and has received radiation to bilateral shoulders and L3 and L4 ? ?  ? ?DVT prophylaxis:  Lovenox ?  Code Status: Full Code  ?Level of Care: Level of care: Telemetry ?Disposition Plan:  ?Status is: Inpatient ?Remains inpatient appropriate because: poor oral intake- follow for at least one more day ? ?Objective: ?  ?Vitals:  ? 05/27/21 1503 05/27/21 2051 05/28/21 0549 05/28/21 1114  ?BP: 117/64 114/62 137/69 135/73  ?Pulse: 92 89 86 90  ?Resp:  20 (!) 21 (!) 21  ?Temp: 98.4 ?F (36.9 ?C) 99.8 ?F (37.7 ?C) 97.6 ?F (36.4 ?C) 98.5 ?F (36.9 ?C)  ?TempSrc: Oral Oral Oral Oral  ?SpO2: 100% 100% 100% 100%  ?Weight:      ?Height:      ? ?Filed Weights  ? 05/25/21 2307 05/26/21 1723  ?Weight: 87.5 kg 87.5 kg  ? ?Exam: ?General exam: Appears comfortable  ?HEENT: PERRLA, oral mucosa moist, no sclera icterus or thrush ?Respiratory system: Clear to auscultation. Respiratory effort normal. ?Cardiovascular system: S1 & S2 heard, regular rate and rhythm ?Gastrointestinal system: Abdomen soft, non-tender, nondistended. Normal bowel sounds   ?Central nervous system: Alert and oriented. No focal neurological deficits. ?Extremities: No cyanosis, clubbing or  edema ?Skin: No rashes or ulcers ?Psychiatry:  Mood & affect appropriate.   ? ?Imaging and lab data was personally reviewed ? ? ? CBC: ?Recent Labs  ?Lab 05/25/21 ?2312 05/27/21 ?3244  ?WBC 12.5* 9.7  ?NEUTROABS 10.7*  --   ?HGB 8.2* 7.6*  ?HCT 26.4* 22.9*  ?MCV 92.3 89.8  ?PLT 246 245  ? ?Basic Metabolic Panel: ?Recent Labs  ?Lab 05/25/21 ?2312 05/27/21 ?0102  ?NA 139 138  ?K 3.7 3.7  ?CL 105 106  ?CO2 24 24  ?GLUCOSE 167* 194*  ?BUN 13 15  ?CREATININE 0.70  0.57*  ?CALCIUM 8.0* 7.8*  ? ?GFR: ?Estimated Creatinine Clearance: 91.6 mL/min (A) (by C-G formula based on SCr of 0.57 mg/dL (L)). ? ?Scheduled Meds: ? (feeding supplement) PROSource Plus  30 mL Oral Daily  ? aspirin  81 mg Oral Daily  ? enoxaparin (LOVENOX) injection  40 mg Subcutaneous Q24H  ? famotidine  20 mg Oral Daily  ? [START ON 05/29/2021] feeding supplement  1 Container Oral BID BM  ? feeding supplement  237 mL Oral BID BM  ? fluconazole  200 mg Oral Daily  ? insulin aspart  0-20 Units Subcutaneous TID WC  ? insulin glargine-yfgn  21 Units Subcutaneous QHS  ? lidocaine  2 patch Transdermal Q24H  ? megestrol  400 mg Oral BID  ? multivitamin with minerals  1 tablet Oral Daily  ? nystatin  5 mL Oral QID  ? rosuvastatin  10 mg Oral QPM  ? sodium chloride flush  3 mL Intravenous Q12H  ? ?Continuous Infusions: ? cefTRIAXone (ROCEPHIN)  IV 2 g (05/28/21 1152)  ? ? ? LOS: 2 days  ? ?Author: ?Debbe Odea  ?05/28/2021 2:50 PM ?   ?

## 2021-05-28 NOTE — Consult Note (Signed)
[facility] CM Inpatient Consult ? ? ?05/28/2021 ? ?Stephen Lynch ?[birth year] ?[id number] ? ?Patient is currently active with [facility] CM) for chronic disease management services.  Patient has been engaged by a [facility].  Our community based plan of care has focused on disease management and community resource support.   ?  ?Plan: Continue to follow up for progression and disposition plans.  ? ?Of note, [facility] Care Management services does not replace or interfere with any services that are arranged by inpatient case management or social work.  ? ?Netta Cedars, MSN, RN ?[place] [facility] Liaison ?Phone [phone] ?Toll free office [phone]  ?

## 2021-05-28 NOTE — Progress Notes (Signed)
Initial Nutrition Assessment ? ?DOCUMENTATION CODES:  ? ?Not applicable ? ?INTERVENTION:  ?- continue Ensure Plus High Protein BID, each supplement provides 350 kcal and 20 grams of protein. ?- will d/c Glucerna Shake. ?- will order Boost Breeze BID, each supplement provides 250 kcal and 9 grams of protein. ?- will order 30 ml Prosource Plus BID, each supplement provides 100 kcal and 15 grams protein.  ?- will order 1 tablet Multivitamin with minerals daily. ? ? ?NUTRITION DIAGNOSIS:  ? ?Increased nutrient needs related to acute illness, cancer and cancer related treatments as evidenced by estimated needs. ? ?GOAL:  ? ?Patient will meet greater than or equal to 90% of their needs ? ?MONITOR:  ? ?PO intake, Supplement acceptance, Labs, Weight trends ? ?REASON FOR ASSESSMENT:  ? ?Malnutrition Screening Tool, Consult ?Assessment of nutrition requirement/status, Poor PO ? ?ASSESSMENT:  ? ?76 y.o. male with medical history of HTN, dyslipidemia, type 2 DM, and metastatic prostate cancer on chemo (last ~2 weeks PTA). He presented to the ED due to increased weakness and confusion, difficulty swallowing, decreased appetite, N/V, BLE swelling, discomfort with urination, and L hip pain. He was admitted with sepsis 2/2 UTI. ? ?Patient laying in bed sleeping at the time of RD visit but easily awoke to name call x2 and shoulder rub x1. He shares that his wife typically comes to visit in the evening. He reports that he had a few bites of cereal for breakfast this AM. He denies abdominal pain, pressure, or nausea today. ? ?Lunch tray had arrived prior to RD visit (meatloaf, broccoli, mac and cheese, grapes, and iced tea). Patient wanted roasted [place], mashed potatoes with gravy, and green beans instead. Ordered this for him. ? ?He shares that he drank Ensure earlier today and it cased him to have a BM. He has been accepting Ensure each time it has been offered to him.  ? ?Patient noted to have thrush and medication ordered for  this. He denies overt oral pain but does indicate it is there and is unsure if pain has decreased since admission.  ? ?He indicates that he has decreased appetite after chemo infusions and that current feelings of lack of appetite are similar to usual. ? ?Weight on 4/15 was 193 lb and weight on 05/03/21 was 200 lb. This indicates 7 lb weight loss (3.5% body weight) in the past 3 weeks. Of note, he does have moderate pitting edema to BLE so weight loss is likely more but unable to confirm at this time. ? ? ? ?Labs reviewed; CBGs: 141 and 170 mg/dl, creatinine: 0.57 mg/dl; Ca: 7.8 mg/dl, Alk Phos elevated. ? ?Medications reviewed; 20 mg oral pepcid/day, sliding scale novolog, 21 units semglee/day, 400 mg megace BID started 4/17, 5 ml mycostatin QID. ?  ? ?NUTRITION - FOCUSED PHYSICAL EXAM: ? ?Flowsheet Row Most Recent Value  ?Orbital Region No depletion  ?Upper Arm Region No depletion  ?Thoracic and Lumbar Region Unable to assess  ?Buccal Region No depletion  ?Temple Region Mild depletion  ?Clavicle Bone Region Mild depletion  ?Clavicle and Acromion Bone Region Mild depletion  ?Scapular Bone Region No depletion  ?Dorsal Hand No depletion  ?Patellar Region No depletion  ?Anterior Thigh Region No depletion  ?Posterior Calf Region No depletion  ?Edema (RD Assessment) Moderate  [BLE]  ?Hair Reviewed  ?Eyes Reviewed  ?Mouth Reviewed  [thrush]  ?Skin Reviewed  ?Nails Reviewed  ? ?  ? ? ?Diet Order:   ?Diet Order   ? ?       ?  Diet Carb Modified Fluid consistency: Thin; Room service appropriate? No  Diet effective now       ?  ? ?  ?  ? ?  ? ? ?EDUCATION NEEDS:  ? ?No education needs have been identified at this time ? ?Skin:  Skin Assessment: Reviewed RN Assessment ? ?Last BM:  4/17 (type 5 x1, small amount) ? ?Height:  ? ?Ht Readings from Last 1 Encounters:  ?05/26/21 _0  (1.854 m)  ? ? ?Weight:  ? ?Wt Readings from Last 1 Encounters:  ?05/26/21 87.5 kg  ? ? ? ?BMI:  Body mass index is 25.45 kg/m?. ? ?Estimated  Nutritional Needs:  ?Kcal:  2200-2400 kcal ?Protein:  110-125 grams ?Fluid:  >/= 2.2 L/day ? ? ? ? ?Jarome Matin, MS, RD, LDN ?Registered Dietitian II ?Inpatient Clinical Nutrition ?RD pager # and on-call/weekend pager # available in [place]  ? ?

## 2021-05-29 DIAGNOSIS — N390 Urinary tract infection, site not specified: Secondary | ICD-10-CM | POA: Diagnosis not present

## 2021-05-29 DIAGNOSIS — G9341 Metabolic encephalopathy: Secondary | ICD-10-CM | POA: Diagnosis not present

## 2021-05-29 DIAGNOSIS — A419 Sepsis, unspecified organism: Secondary | ICD-10-CM | POA: Diagnosis not present

## 2021-05-29 LAB — GLUCOSE, CAPILLARY
Glucose-Capillary: 135 mg/dL — ABNORMAL HIGH (ref 70–99)
Glucose-Capillary: 226 mg/dL — ABNORMAL HIGH (ref 70–99)
Glucose-Capillary: 207 mg/dL — ABNORMAL HIGH (ref 70–99)
Glucose-Capillary: 237 mg/dL — ABNORMAL HIGH (ref 70–99)

## 2021-05-29 NOTE — Care Management Important Message (Signed)
Important Message ? ?Patient Details IM Letter placed in Patients room. ?Name: Stephen Lynch ?MRN: [id number] ?Date of Birth: Dec 08, 1945 ? ? ?Medicare Important Message Given:  Yes ? ? ? ? ?Kerin Salen ?05/29/2021, 12:39 PM ?

## 2021-05-29 NOTE — Progress Notes (Signed)
Occupational Therapy Treatment ?Patient Details ?Name: Stephen Lynch ?MRN: [id number] ?DOB: September 26, 1945 ?Today's Date: 05/29/2021 ? ? ?History of present illness Patient is a 76 year old male who presented to the hosptial with confusion, fever and weakness. patient was found to have sepsis secondary to UTI. 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  ?OT comments ? Patient was pleasant and cooperative during session. Patient completed standing at sink with set up and SUP for grooming tasks with RW. Patient was able to complete functional mobility in room to and from bathroom with min guard with cues to keep walker close to him. Patient's discharge plan remains appropriate at this time. OT will continue to follow acutely.  ?  ? ?Recommendations for follow up therapy are one component of a multi-disciplinary discharge planning process, led by the attending physician.  Recommendations may be updated based on patient status, additional functional criteria and insurance authorization. ?   ?Follow Up Recommendations ? Home health OT  ?  ?Assistance Recommended at Discharge Frequent or constant Supervision/Assistance  ?Patient can return home with the following ? A little help with walking and/or transfers;A little help with bathing/dressing/bathroom;Assistance with cooking/housework;Direct supervision/assist for financial management;Assist for transportation;Help with stairs or ramp for entrance;Direct supervision/assist for medications management ?  ?Equipment Recommendations ? None recommended by OT  ?  ?Recommendations for Other Services   ? ?  ?Precautions / Restrictions Precautions ?Precautions: Fall ?Restrictions ?Weight Bearing Restrictions: No  ? ? ?  ? ?Mobility Bed Mobility ?Overal bed mobility: Needs Assistance ?Bed Mobility: Supine to Sit ?  ?  ?Supine to sit: Min assist ?Sit to supine: Min  assist ?  ?General bed mobility comments: assist to progress LLEs on and off the bed ?  ? ?Transfers ?  ?  ?  ?  ?  ?  ?  ?  ?  ?  ?  ?  ?Balance Overall balance assessment: Mild deficits observed, not formally tested ?  ?  ?  ?  ?  ?  ?  ?  ?  ?  ?  ?  ?  ?  ?  ?  ?  ?  ?   ? ?ADL either performed or assessed with clinical judgement  ? ?ADL Overall ADL's : Needs assistance/impaired ?  ?  ?Grooming: Oral care;Standing;Set up ?Grooming Details (indicate cue type and reason): at sink with RW ?  ?  ?  ?  ?  ?  ?  ?  ?Toilet Transfer: Min guard;Rolling walker (2 wheels);Ambulation;BSC/3in1 ?Toilet Transfer Details (indicate cue type and reason): to bathroom and back with cues to keep RW close to him ?  ?  ?  ?  ?  ?  ?  ? ?Extremity/Trunk Assessment   ?  ?  ?  ?  ?  ? ?Vision   ?  ?  ?Perception   ?  ?Praxis   ?  ? ?Cognition Arousal/Alertness: Awake/alert ?Behavior During Therapy: [facility] for tasks assessed/performed, Flat affect ?Overall Cognitive Status: Within Functional Limits for tasks assessed ?  ?  ?  ?  ?  ?  ?  ?  ?  ?  ?  ?  ?  ?  ?  ?  ?General Comments: patient was noted to remember therapist from over the weekend with appropriateness during tasks. did need safety cues with RW during session. plesant gentleman ?  ?  ?   ?  Exercises   ? ?  ?Shoulder Instructions   ? ? ?  ?General Comments    ? ? ?Pertinent Vitals/ Pain       Pain Assessment ?Pain Assessment: No/denies pain ? ?Home Living   ?  ?  ?  ?  ?  ?  ?  ?  ?  ?  ?  ?  ?  ?  ?  ?  ?  ?  ? ?  ?Prior Functioning/Environment    ?  ?  ?  ?   ? ?Frequency ? Min 2X/week  ? ? ? ? ?  ?Progress Toward Goals ? ?OT Goals(current goals can now be found in the care plan section) ? Progress towards OT goals: Progressing toward goals ? ?   ?Plan Discharge plan remains appropriate   ? ?Co-evaluation ? ? ?   ?  ?  ?  ?  ? ?  ?AM-PAC OT "6 Clicks" Daily Activity     ?Outcome Measure ? ? Help from another person eating meals?: A Little ?Help from another person taking care of  personal grooming?: A Little ?Help from another person toileting, which includes using toliet, bedpan, or urinal?: A Little ?Help from another person bathing (including washing, rinsing, drying)?: A Little ?Help from another person to put on and taking off regular upper body clothing?: A Little ?Help from another person to put on and taking off regular lower body clothing?: A Lot ?6 Click Score: 17 ? ?  ?End of Session Equipment Utilized During Treatment: Gait belt;Rolling walker (2 wheels) ? ?OT Visit Diagnosis: Unsteadiness on feet (R26.81);Muscle weakness (generalized) (M62.81) ?  ?Activity Tolerance Patient tolerated treatment well ?  ?Patient Left in bed;with call bell/phone within reach ?  ?Nurse Communication Other (comment) (ok to see) ?  ? ?   ? ?Time: 5732-2025 ?OT Time Calculation (min): 17 min ? ?Charges: OT General Charges ?$OT Visit: 1 Visit ?OT Treatments ?$Self Care/Home Management : 8-22 mins ? ?Stephen Lynch OTR/L, MS ?Acute Rehabilitation Department ?Office# [phone] ?Pager# [phone] ? ? ?[name] ?05/29/2021, 1:57 PM ?

## 2021-05-29 NOTE — Progress Notes (Signed)
?[facility] Progress Note ? ?Patient: Stephen Lynch     ?XQJ:[id number]  ?DOA: 05/25/2021   ?PCP: [facility]  ? ?  ?  ?Brief hospital course: ?This is a 76 year old male with diabetes mellitus type 2, hypertension and dyslipidemia who has metastatic prostate cancer on chemotherapy and was brought to the hospital by his wife for poor oral intake for the past few days and a fever. ?Patient was noted to have a temperature of 102 when checked by EMS and a heart rate in the 140s.  He received IV fluid boluses.  In the ED temperature was 103.4.  Blood and urine cultures were obtained.  UA was consistent with a UTI and he was started on broad-spectrum antibiotics. ? ?Subjective:  ?He still has no appetite and is not eating solid food. Per RN, he is only drinking small amounts. He has no other complaints. We have discussed that I cannot discharge him home until his oral intake improves. He appears to understand.  ? ?Assessment and Plan: ?Principal Problem: ?  Sepsis in immune compromised patient ?- Etiology remains undetermined- blood cultures have been negative, respiratory panel was negative, influenza and COVID PCRs negative ?- Urine has grown out 30,000 colonies of E. coli ?- He initially received cefepime and vancomycin and then on 4/15 was transitioned to ceftriaxone and then transitioned to Cefadroxil on 4/17 ?- -I would favor completing a 7-day course to treat for possible UTI- stop date 7/21 ?- No recurrence of fevers noted however, the patient is also receiving Tylenol which may be suppressing fevers ? ? ?Active Problems: ?  Acute metabolic encephalopathy ?-Secondary to fever/ sepsis ?- The patient appears oriented x 3 now ? ?Poor oral intake & severe hypoalbuminemia secondary to underlying metastatic cancer ?-Albumin level is 1.6 ?- He states that loss of appetite occurred after radiation treatment and he has not yet tried an appetite stimulant ?- I have started Megace ?- Continue to  follow oral intake closely-I do not feel he is stable to discharge until I see his oral intake improve ?- have asked nutrition to start a calorie count- have spoken with nursing to document food intake closely and encourage oral intake ? ?Thrush ?-?  If he may have a esophageal candidiasis which is limiting his ability to eat ?-He has been receiving nystatin however, on 4/17, he had thick yellow plaques on his tongue ?-I started Diflucan on 4/17- thrush appears improved today ? ?  Uncontrolled type 2 diabetes mellitus with hyperglycemia, with long-term current use of insulin ([place]) ?   ?Component Value Date/Time  ? HGBA1C 8.4 (H) 05/14/2021 1112  ?-Continue Semglee and insulin sliding scale ?- due to poor oral intake, have liberated his diet from carb modified to regular ? ?  Malignant neoplasm of prostate metastatic to bone [facility]) ?-Receives chemotherapy (Dr. Alen Blew) and has received radiation to bilateral shoulders and L3 and L4 ? ?  ? ?DVT prophylaxis:  Lovenox ?  Code Status: Full Code  ?Level of Care: Level of care: Telemetry ?Disposition Plan:  ?Status is: Inpatient ?Remains inpatient appropriate because: poor oral intake- follow for at least one more day ? ?Objective: ?  ?Vitals:  ? 05/28/21 1114 05/28/21 2118 05/29/21 0400 05/29/21 1140  ?BP: 135/73 (!) 132/59 130/69 125/62  ?Pulse: 90 79 95 89  ?Resp: (!) '21 20 18 '$ (!) 22  ?Temp: 98.5 ?F (36.9 ?C) 98.3 ?F (36.8 ?C) 98.6 ?F (37 ?C) 98.2 ?F (36.8 ?C)  ?TempSrc: Oral Oral Oral Oral  ?  SpO2: 100% 100% 100% 99%  ?Weight:      ?Height:      ? ?Filed Weights  ? 05/25/21 2307 05/26/21 1723  ?Weight: 87.5 kg 87.5 kg  ? ?Exam: ?General exam: Appears comfortable  ?HEENT: PERRLA, oral mucosa moist, no sclera icterus or thrush ?Respiratory system: Clear to auscultation. Respiratory effort normal. ?Cardiovascular system: S1 & S2 heard, regular rate and rhythm ?Gastrointestinal system: Abdomen soft, non-tender, nondistended. Normal bowel sounds   ?Central nervous system:  Alert and oriented. No focal neurological deficits. ?Extremities: No cyanosis, clubbing or edema ?Skin: No rashes or ulcers ?Psychiatry:  Mood & affect appropriate.   ? ?Imaging and lab data was personally reviewed ? ? ? CBC: ?Recent Labs  ?Lab 05/25/21 ?2312 05/27/21 ?9628  ?WBC 12.5* 9.7  ?NEUTROABS 10.7*  --   ?HGB 8.2* 7.6*  ?HCT 26.4* 22.9*  ?MCV 92.3 89.8  ?PLT 246 245  ? ? ?Basic Metabolic Panel: ?Recent Labs  ?Lab 05/25/21 ?2312 05/27/21 ?3662  ?NA 139 138  ?K 3.7 3.7  ?CL 105 106  ?CO2 24 24  ?GLUCOSE 167* 194*  ?BUN 13 15  ?CREATININE 0.70 0.57*  ?CALCIUM 8.0* 7.8*  ? ? ?GFR: ?Estimated Creatinine Clearance: 91.6 mL/min (A) (by C-G formula based on SCr of 0.57 mg/dL (L)). ? ?Scheduled Meds: ? (feeding supplement) PROSource Plus  30 mL Oral Daily  ? aspirin  81 mg Oral Daily  ? cefadroxil  500 mg Oral BID  ? enoxaparin (LOVENOX) injection  40 mg Subcutaneous Q24H  ? famotidine  20 mg Oral Daily  ? feeding supplement  1 Container Oral BID BM  ? feeding supplement  237 mL Oral BID BM  ? fluconazole  200 mg Oral Daily  ? insulin aspart  0-20 Units Subcutaneous TID WC  ? insulin glargine-yfgn  21 Units Subcutaneous QHS  ? lidocaine  2 patch Transdermal Q24H  ? megestrol  400 mg Oral BID  ? multivitamin with minerals  1 tablet Oral Daily  ? nystatin  5 mL Oral QID  ? rosuvastatin  10 mg Oral QPM  ? sodium chloride flush  3 mL Intravenous Q12H  ? ?Continuous Infusions: ? ? ? ? LOS: 3 days  ? ?Author: ?Debbe Odea  ?05/29/2021 2:27 PM ?   ?

## 2021-05-29 NOTE — Progress Notes (Signed)
NUTRITION NOTE ? ?Secure chat received from MD requesting Calorie Count initiation. Able to communicate with RN via secure chat to request envelope be hung on door for Calorie Count to start with dinner today. ? ?RD will follow-up 4/19 with Day #1 results. ? ? ? ? ? ?Jarome Matin, MS, RD, LDN ?Registered Dietitian II ?Inpatient Clinical Nutrition ?RD pager # and on-call/weekend pager # available in [place]  ? ?

## 2021-05-29 NOTE — Progress Notes (Signed)
Physical Therapy Treatment ?Patient Details ?Name: Stephen Lynch ?MRN: [id number] ?DOB: 1945/02/18 ?Today's Date: 05/29/2021 ? ? ?History of Present Illness Patient is a 76 year old male who presented to the hosptial with confusion, fever and weakness. patient was found to have sepsis secondary to UTI. 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 ?  ?PT Comments  ? ? Pt progressing toward goals. Increasing tolerance to activity/gait. Pt verbalizes he is trying to eat so he can go home, states all the supplements make him a little nauseous. Continue to recommend HHPT at d/c   ?Recommendations for follow up therapy are one component of a multi-disciplinary discharge planning process, led by the attending physician.  Recommendations may be updated based on patient status, additional functional criteria and insurance authorization. ? ?Follow Up Recommendations ? Home health PT ?  ?  ?Assistance Recommended at Discharge Intermittent Supervision/Assistance  ?Patient can return home with the following Help with stairs or ramp for entrance;Assist for transportation;Assistance with cooking/housework ?  ?Equipment Recommendations ? None recommended by PT (pt states he has RW)  ?  ?Recommendations for Other Services   ? ? ?  ?Precautions / Restrictions Precautions ?Precautions: Fall ?Restrictions ?Weight Bearing Restrictions: No  ?  ? ?Mobility ? Bed Mobility ?Overal bed mobility: Needs Assistance ?Bed Mobility: Supine to Sit ?  ?  ?Supine to sit: Min assist ?  ?  ?General bed mobility comments: assist to progress LLE full off EOB ?  ? ?Transfers ?Overall transfer level: Needs assistance ?Equipment used: Rolling walker (2 wheels) ?Transfers: Sit to/from Stand ?Sit to Stand: Min assist, Min guard ?  ?  ?  ?  ?  ?General transfer comment: cues for safety and hand placement, to power up with LEs ?   ? ?Ambulation/Gait ?Ambulation/Gait assistance: Min guard, Supervision ?Gait Distance (Feet): 170 Feet ?Assistive device: Rolling walker (2 wheels) ?Gait Pattern/deviations: Step-through pattern, Decreased stride length, Trunk flexed ?Gait velocity: decr ?  ?  ?General Gait Details: unsteady initially however without overt LOB. cues for trunk extension ? ? ?Stairs ?  ?  ?  ?  ?  ? ? ?Wheelchair Mobility ?  ? ?Modified Rankin (Stroke Patients Only) ?  ? ? ?  ?Balance   ?  ?  ?  ?  ?  ?  ?  ?  ?  ?  ?  ?  ?  ?  ?  ?  ?  ?  ?  ? ?  ?Cognition Arousal/Alertness: Awake/alert ?Behavior During Therapy: [facility] for tasks assessed/performed, Flat affect ?Overall Cognitive Status: Within Functional Limits for tasks assessed ?  ?  ?  ?  ?  ?  ?  ?  ?  ?  ?  ?  ?  ?  ?  ?  ?  ?  ?  ? ?  ?Exercises   ? ?  ?General Comments   ?  ?  ? ?Pertinent Vitals/Pain Pain Assessment ?Pain Assessment: No/denies pain  ? ? ?Home Living   ?  ?  ?  ?  ?  ?  ?  ?  ?  ?   ?  ?Prior Function    ?  ?  ?   ? ?PT Goals (current goals can now be found in the care plan section) Acute Rehab PT Goals ?Patient Stated Goal: feel better ?PT Goal Formulation: With patient ?Time For Goal Achievement:  06/10/21 ?Potential to Achieve Goals: Good ?Progress towards PT goals: Progressing toward goals ? ?  ?Frequency ? ? ? Min 3X/week ? ? ? ?  ?PT Plan Current plan remains appropriate  ? ? ?Co-evaluation   ?  ?  ?  ?  ? ?  ?AM-PAC PT "6 Clicks" Mobility   ?Outcome Measure ? Help needed turning from your back to your side while in a flat bed without using bedrails?: A Little ?Help needed moving from lying on your back to sitting on the side of a flat bed without using bedrails?: A Little ?Help needed moving to and from a bed to a chair (including a wheelchair)?: A Little ?Help needed standing up from a chair using your arms (e.g., wheelchair or bedside chair)?: A Little ?Help needed to walk in hospital room?: A Little ?Help needed climbing 3-5 steps with a railing? : A  Lot ?6 Click Score: 17 ? ?  ?End of Session Equipment Utilized During Treatment: Gait belt ?Activity Tolerance: Patient tolerated treatment well ?Patient left: Other (comment);with call bell/phone within reach;with bed alarm set (EOB to finish eating) ?  ?PT Visit Diagnosis: Other abnormalities of gait and mobility (R26.89);Difficulty in walking, not elsewhere classified (R26.2) ?  ? ? ?Time: 1220-1240 ?PT Time Calculation (min) (ACUTE ONLY): 20 min ? ?Charges:  $Gait Training: 8-22 mins          ?          ? Baxter Flattery, PT ? ?Acute Rehab Dept [facility]) [phone] ?Pager [phone] ? ?05/29/2021 ? ? ? ?Ann Groeneveld ?05/29/2021, 12:49 PM ? ?

## 2021-05-30 ENCOUNTER — Encounter: Payer: Self-pay | Admitting: Oncology

## 2021-05-30 ENCOUNTER — Telehealth: Payer: Self-pay | Admitting: *Deleted

## 2021-05-30 DIAGNOSIS — D509 Iron deficiency anemia, unspecified: Secondary | ICD-10-CM

## 2021-05-30 DIAGNOSIS — A419 Sepsis, unspecified organism: Secondary | ICD-10-CM | POA: Diagnosis not present

## 2021-05-30 DIAGNOSIS — Z95828 Presence of other vascular implants and grafts: Secondary | ICD-10-CM | POA: Insufficient documentation

## 2021-05-30 DIAGNOSIS — E46 Unspecified protein-calorie malnutrition: Secondary | ICD-10-CM

## 2021-05-30 DIAGNOSIS — E876 Hypokalemia: Secondary | ICD-10-CM

## 2021-05-30 DIAGNOSIS — N390 Urinary tract infection, site not specified: Secondary | ICD-10-CM | POA: Diagnosis not present

## 2021-05-30 LAB — COMPREHENSIVE METABOLIC PANEL
ALT: 19 U/L (ref 0–44)
AST: 58 U/L — ABNORMAL HIGH (ref 15–41)
Albumin: 1.7 g/dL — ABNORMAL LOW (ref 3.5–5.0)
Alkaline Phosphatase: 354 U/L — ABNORMAL HIGH (ref 38–126)
Anion gap: 6 (ref 5–15)
BUN: 10 mg/dL (ref 8–23)
CO2: 24 mmol/L (ref 22–32)
Calcium: 7.8 mg/dL — ABNORMAL LOW (ref 8.9–10.3)
Chloride: 109 mmol/L (ref 98–111)
Creatinine, Ser: 0.71 mg/dL (ref 0.61–1.24)
GFR, Estimated: >60 mL/min (ref >60)
Glucose, Bld: 160 mg/dL — ABNORMAL HIGH (ref 70–99)
Potassium: 3.2 mmol/L — ABNORMAL LOW (ref 3.5–5.1)
Sodium: 139 mmol/L (ref 135–145)
Total Bilirubin: 0.3 mg/dL (ref 0.3–1.2)
Total Protein: 5.4 g/dL — ABNORMAL LOW (ref 6.5–8.1)

## 2021-05-30 LAB — IRON AND TIBC
Iron: 16 ug/dL — ABNORMAL LOW (ref 45–182)
Saturation Ratios: 14 % — ABNORMAL LOW (ref 17.9–39.5)
TIBC: 114 ug/dL — ABNORMAL LOW (ref 250–450)
UIBC: 98 ug/dL

## 2021-05-30 LAB — RETICULOCYTES
Immature Retic Fract: 33.5 % — ABNORMAL HIGH (ref 2.3–15.9)
RBC.: 2.44 MIL/uL — ABNORMAL LOW (ref 4.22–5.81)
Retic Count, Absolute: 60 10*3/uL (ref 19.0–186.0)
Retic Ct Pct: 2.5 % (ref 0.4–3.1)

## 2021-05-30 LAB — CBC
HCT: 21.7 % — ABNORMAL LOW (ref 39.0–52.0)
Hemoglobin: 7 g/dL — ABNORMAL LOW (ref 13.0–17.0)
MCH: 29 pg (ref 26.0–34.0)
MCHC: 32.3 g/dL (ref 30.0–36.0)
MCV: 90 fL (ref 80.0–100.0)
Platelets: 245 10*3/uL (ref 150–400)
RBC: 2.41 MIL/uL — ABNORMAL LOW (ref 4.22–5.81)
RDW: 21.8 % — ABNORMAL HIGH (ref 11.5–15.5)
WBC: 6.1 10*3/uL (ref 4.0–10.5)
nRBC: 1 % — ABNORMAL HIGH (ref 0.0–0.2)

## 2021-05-30 LAB — VITAMIN B12: Vitamin B-12: 2941 pg/mL — ABNORMAL HIGH (ref 180–914)

## 2021-05-30 LAB — GLUCOSE, CAPILLARY
Glucose-Capillary: 141 mg/dL — ABNORMAL HIGH (ref 70–99)
Glucose-Capillary: 199 mg/dL — ABNORMAL HIGH (ref 70–99)
Glucose-Capillary: 237 mg/dL — ABNORMAL HIGH (ref 70–99)
Glucose-Capillary: 242 mg/dL — ABNORMAL HIGH (ref 70–99)

## 2021-05-30 LAB — FOLATE: Folate: 8.1 ng/mL (ref >5.9)

## 2021-05-30 LAB — FERRITIN: Ferritin: 2553 ng/mL — ABNORMAL HIGH (ref 24–336)

## 2021-05-30 LAB — MAGNESIUM: Magnesium: 1.9 mg/dL (ref 1.7–2.4)

## 2021-05-30 MED ORDER — FERROUS SULFATE 325 (65 FE) MG PO TABS
325.0000 mg | ORAL_TABLET | Freq: Every day | ORAL | Status: DC
  Administered 2021-05-31 – 2021-06-01 (×2): 325 mg via ORAL
  Filled 2021-05-30 (×2): qty 1

## 2021-05-30 MED ORDER — PROSOURCE PLUS PO LIQD
30.0000 mL | Freq: Two times a day (BID) | ORAL | Status: DC
  Administered 2021-05-30 – 2021-06-01 (×4): 30 mL via ORAL
  Filled 2021-05-30 (×4): qty 30

## 2021-05-30 MED ORDER — POTASSIUM CHLORIDE 20 MEQ PO PACK
40.0000 meq | PACK | Freq: Two times a day (BID) | ORAL | Status: AC
  Administered 2021-05-30 (×2): 40 meq via ORAL
  Filled 2021-05-30 (×2): qty 2

## 2021-05-30 MED ORDER — SENNOSIDES-DOCUSATE SODIUM 8.6-50 MG PO TABS
1.0000 | ORAL_TABLET | Freq: Every day | ORAL | Status: DC
  Administered 2021-05-30 – 2021-06-01 (×3): 1 via ORAL
  Filled 2021-05-30 (×3): qty 1

## 2021-05-30 MED ORDER — BOOST / RESOURCE BREEZE PO LIQD CUSTOM
1.0000 | Freq: Three times a day (TID) | ORAL | Status: DC
  Administered 2021-05-30 – 2021-06-01 (×6): 1 via ORAL

## 2021-05-30 MED FILL — Dexamethasone Sodium Phosphate Inj 100 MG/10ML: INTRAMUSCULAR | Qty: 1 | Status: AC

## 2021-05-30 NOTE — Assessment & Plan Note (Addendum)
Anemia multifactorial, iron deficiency and malignancy.  ?Started ferrous sulfate.  ?Hb down to 6.8.  ?Received one unit PRBC. Hb post transfusion at .  ?

## 2021-05-30 NOTE — Telephone Encounter (Signed)
Scheduling message sent. 

## 2021-05-30 NOTE — Assessment & Plan Note (Signed)
Secondary to hypoalbuminemia ?

## 2021-05-30 NOTE — Progress Notes (Signed)
Calorie Count Note ? ?48 hour calorie count ordered. ? ?Diet: Regular ?Supplements:  ?-Boost Breeze po BID, each supplement provides 250 kcal and 9 grams of protein ?-Ensure Plus High Protein po BID, each supplement provides 350 kcal and 20 grams of protein.  ?-Prosource Plus PO BID, each provides 100 kcals and 15g protein ? ?4/18-4/19: ?Breakfast (4/19): 50 kcals ?Lunch: 780 kcals, 14g protein ?Dinner: 90 kcals ?Snacks: 20 kcals ?Supplements: Boost Breeze x 4 +1 Prosource, 1100 kcals, 51g protein ? ?Total intake: ?1940 kcal (88% of minimum estimated needs)  ?65g protein (59% of minimum estimated needs) ? ?Nutrition Dx: Increased nutrient needs related to acute illness, cancer and cancer related treatments as evidenced by estimated needs. ? ?Goal: Pt to meet >/= 90% of their estimated nutrition needs  ? ?Intervention:  ?-Boost Breeze TID ?-Prosource Plus BID ?-D/c Ensure ?-Multivitamin with minerals daily ? ? ?Clayton Bibles, MS, RD, LDN ?Inpatient Clinical Dietitian ?Contact information available via Amion ?  ?

## 2021-05-30 NOTE — Progress Notes (Signed)
?  Progress Note ? ? ?Patient: Stephen Lynch ZOX:[id number] DOB: 1946/01/01 DOA: 05/25/2021     4 ?DOS: the patient was seen and examined on 05/30/2021 ?  ?Brief hospital course: ?76 year old with past medical history significant for diabetes type 2, hypertension, dyslipidemia, metastatic prostate cancer on chemotherapy brought to the hospital by his wife due to poor oral intake for the past few days and fever.  He was noted to have a temperature of 102, tachycardic heart rate 140.  He received IV fluids.  UA was consistent with UTI and he was a started on broad-spectrum antibiotics. ? ?Patient was admitted with sepsis secondary to UTI.  He was treated with IV antibiotics, currently on oral antibiotics. ? ?At this time we are monitoring calorie counts to make sure that patient is able to maintain caloric requirement. ? ? ?Assessment and Plan: ?* Sepsis secondary to UTI [facility]) ?Scented with fever, leukocytosis, tachypnea ?Course of infection, UTI, UA with positive nitrates, large leukocytes.  Although urine culture did not have significant growth, patient likely had sepsis from UTI. ?Initially on broad-spectrum antibiotics.  Currently on cefadroxil  to complete 7 days ? ?Hypokalemia ?Replete orally.  ? ?Iron deficiency anemia ?Anemia multifactorial, iron deficiency and l=malignancy.  ?Start ferrous sulfate.  ?Might need Blood transfusion in hb continue  to decreased.  ? ?Hypoalbuminemia due to protein-calorie malnutrition ([place]) ?In the context of acute illness and malignancy. ?Nutritionist doing calorie counts ?Albumin 1.7 ? ?GERD (gastroesophageal reflux disease) ?Continue with famotidine ? ?Lower extremity edema ?Secondary to hypoalbuminemia ? ?Thrush of mouth and esophagus ([place]) ?Improved started on Diflucan.  Continue ? ?Malignant neoplasm of prostate metastatic to bone [facility]) ?Receive chemotherapy and has received radiation to bilateral shoulders and L3 and L4. ?Follow-up with Dr. Alen Blew. ?Family to reschedule  chemotherapy ? ?Uncontrolled type 2 diabetes mellitus with hyperglycemia, with long-term current use of insulin ([place]) ?Continue with Semglee, and SSI.  ? ?Acute metabolic encephalopathy ?Secondary to fever, sepsis. ?Improved.   Patient now is alert and oriented x3.   ? ? ? ? ?  ? ?Subjective: he is alert, denies pain. He was eating and drinking ensure during my evaluation.  ? ?Physical Exam: ?Vitals:  ? 05/29/21 1140 05/29/21 2202 05/30/21 0430 05/30/21 1204  ?BP: 125/62 (!) 160/94 138/63 133/64  ?Pulse: 89 90 94 99  ?Resp: (!) '22 20 20 20  '$ ?Temp: 98.2 ?F (36.8 ?C) 99 ?F (37.2 ?C) 98.3 ?F (36.8 ?C) (!) 97.5 ?F (36.4 ?C)  ?TempSrc: Oral Oral  Oral  ?SpO2: 99% 100% 99% 100%  ?Weight:      ?Height:      ? ?General; NAD ?Lung; CTA ?Abdomen; soft, nt ? ?Data Reviewed: ? ?Cbc urine culture.  ? ?Family Communication: no family at bedside.  ? ?Disposition: ?Status is: Inpatient ?Remains inpatient appropriate because: monitor oral intake.  ? Planned Discharge Destination: Home ? ? ? ?Time spent: 35 minutes ? ?Author: ?Elmarie Shiley, MD ?05/30/2021 1:43 PM ? ?For on call review [email or web address].  ?

## 2021-05-30 NOTE — Assessment & Plan Note (Signed)
Receive chemotherapy and has received radiation to bilateral shoulders and L3 and L4. ?Follow-up with Dr. Alen Blew. ?Family to reschedule chemotherapy ?

## 2021-05-30 NOTE — Assessment & Plan Note (Signed)
Continue with famotidine.  

## 2021-05-30 NOTE — Assessment & Plan Note (Signed)
Secondary to fever, sepsis. ?Improved.   Patient now is alert and oriented x3.   ?

## 2021-05-30 NOTE — Assessment & Plan Note (Addendum)
Scented with fever, leukocytosis, tachypnea ?Course of infection, UTI, UA with positive nitrates, large leukocytes.  Although urine culture did not have significant growth, patient likely had sepsis from UTI. ?Initially on broad-spectrum antibiotics.  Currently on cefadroxil  to complete 7 days. Discharge on 2 more days.  ?

## 2021-05-30 NOTE — Assessment & Plan Note (Signed)
Improved started on Diflucan.  Continue ?

## 2021-05-30 NOTE — Assessment & Plan Note (Addendum)
Replaced. °

## 2021-05-30 NOTE — Assessment & Plan Note (Signed)
In the context of acute illness and malignancy. ?Nutritionist doing calorie counts ?Albumin 1.7 ?

## 2021-05-30 NOTE — Assessment & Plan Note (Signed)
Continue with Semglee, and SSI.  ?

## 2021-05-30 NOTE — Hospital Course (Addendum)
76 year old with past medical history significant for diabetes type 2, hypertension, dyslipidemia, metastatic prostate cancer on chemotherapy brought to the hospital by his wife due to poor oral intake for the past few days and fever.  He was noted to have a temperature of 102, tachycardic heart rate 140.  He received IV fluids.  UA was consistent with UTI and he was a started on broad-spectrum antibiotics. ? ?Patient was admitted with sepsis secondary to UTI.  He was treated with IV antibiotics, currently on oral antibiotics. ? ?Patient has been eating more, adequate caloric count. Hb down to 6. Getting blood transfusion.  ? ?

## 2021-05-30 NOTE — Telephone Encounter (Signed)
-----   Message from [name], MD sent at 05/30/2021  2:29 PM EDT ----- ?Regarding: RE: Hospitalization ?We need to reschedule his visit. Lab/flush + MD (10:30) Chemo on 4/24. Injection 4/26 ?----- Message ----- ?From: Rolene Course, RN ?Sent: 05/30/2021   2:25 PM EDT ?To: [name], MD ?Subject: Hospitalization                               ? ?Mr Huffaker's wife came by, he is currently hospitalized with a UTI, she said he may be DC'd tomorrow & she was asking if he should come for his infusion.  I told her I didn't think you would recommend him having tx tomorrow since recently sick.  He doesn't currently have another appointment scheduled.  Please advise. ? ?Thanks, ?Bethena Roys ? ? ?

## 2021-05-31 ENCOUNTER — Inpatient Hospital Stay: Payer: Medicare Other

## 2021-05-31 ENCOUNTER — Inpatient Hospital Stay: Payer: Medicare Other | Admitting: Oncology

## 2021-05-31 DIAGNOSIS — A419 Sepsis, unspecified organism: Secondary | ICD-10-CM | POA: Diagnosis not present

## 2021-05-31 DIAGNOSIS — N390 Urinary tract infection, site not specified: Secondary | ICD-10-CM | POA: Diagnosis not present

## 2021-05-31 LAB — CULTURE, BLOOD (ROUTINE X 2)
Culture: NO GROWTH
Culture: NO GROWTH
Special Requests: ADEQUATE
Special Requests: ADEQUATE

## 2021-05-31 LAB — BASIC METABOLIC PANEL
Anion gap: 5 (ref 5–15)
BUN: 11 mg/dL (ref 8–23)
CO2: 25 mmol/L (ref 22–32)
Calcium: 8 mg/dL — ABNORMAL LOW (ref 8.9–10.3)
Chloride: 109 mmol/L (ref 98–111)
Creatinine, Ser: 0.7 mg/dL (ref 0.61–1.24)
GFR, Estimated: >60 mL/min (ref >60)
Glucose, Bld: 208 mg/dL — ABNORMAL HIGH (ref 70–99)
Potassium: 3.7 mmol/L (ref 3.5–5.1)
Sodium: 139 mmol/L (ref 135–145)

## 2021-05-31 LAB — CBC
HCT: 21.7 % — ABNORMAL LOW (ref 39.0–52.0)
Hemoglobin: 6.8 g/dL — CL (ref 13.0–17.0)
MCH: 28.3 pg (ref 26.0–34.0)
MCHC: 31.3 g/dL (ref 30.0–36.0)
MCV: 90.4 fL (ref 80.0–100.0)
Platelets: 269 10*3/uL (ref 150–400)
RBC: 2.4 MIL/uL — ABNORMAL LOW (ref 4.22–5.81)
RDW: 21.9 % — ABNORMAL HIGH (ref 11.5–15.5)
WBC: 6.3 10*3/uL (ref 4.0–10.5)
nRBC: 0.5 % — ABNORMAL HIGH (ref 0.0–0.2)

## 2021-05-31 LAB — PREPARE RBC (CROSSMATCH)

## 2021-05-31 LAB — GLUCOSE, CAPILLARY
Glucose-Capillary: 153 mg/dL — ABNORMAL HIGH (ref 70–99)
Glucose-Capillary: 219 mg/dL — ABNORMAL HIGH (ref 70–99)
Glucose-Capillary: 296 mg/dL — ABNORMAL HIGH (ref 70–99)
Glucose-Capillary: 250 mg/dL — ABNORMAL HIGH (ref 70–99)

## 2021-05-31 LAB — HEMOGLOBIN AND HEMATOCRIT, BLOOD
HCT: 29.3 % — ABNORMAL LOW (ref 39.0–52.0)
Hemoglobin: 9.4 g/dL — ABNORMAL LOW (ref 13.0–17.0)

## 2021-05-31 MED ORDER — SODIUM CHLORIDE 0.9% IV SOLUTION: Freq: Once | INTRAVENOUS | Status: AC

## 2021-05-31 NOTE — TOC Initial Note (Signed)
Transition of Care (TOC) - Initial/Assessment Note  ? ? ?Patient Details  ?Name: Stephen Lynch ?MRN: [id number] ?Date of Birth: Apr 21, 1945 ? ?Transition of Care [facility]) CM/SW Contact:    ?Dessa Phi, RN ?Phone Number: ?05/31/2021, 2:08 PM ? ?Clinical Narrative:  [facility] HHPTOT-spoke to [employer] agree. Has own transport home. No further CM needs.               ? ? ?Expected Discharge Plan: [place] ?Barriers to Discharge: Continued Medical Work up ? ? ?Patient Goals and CMS Choice ?Patient states their goals for this hospitalization and ongoing recovery are:: Home ?CMS Medicare.gov Compare Post Acute Care list provided to:: Patient Represenative (must comment) [name] s/o) ?Choice offered to / list presented to :  [name] s/o) ? ?Expected Discharge Plan and Services ?Expected Discharge Plan: [place] ?  ?Discharge Planning Services: CM Consult ?Post Acute Care Choice: Home Health ?Living arrangements for the past 2 months: [place] ?                ?  ?  ?  ?  ?  ?HH Arranged: PT, OT ?[place] Agency: [place] ?Date HH Agency Contacted: 05/31/21 ?Time [place]: 3016 ?Representative spoke with at [place]: Malachy Mood ? ?Prior Living Arrangements/Services ?Living arrangements for the past 2 months: [place] ?Lives with:: Significant Other ?Patient language and need for interpreter reviewed:: Yes ?Do you feel safe going back to the place where you live?: Yes      ?Need for Family Participation in Patient Care: Yes (Comment) ?Care giver support system in place?: Yes (comment) ?  ?Criminal Activity/Legal Involvement Pertinent to Current Situation/Hospitalization: No - Comment as needed ? ?Activities of Daily Living ?Home Assistive Devices/Equipment: Cane (specify quad or straight) ?ADL Screening (condition at time of admission) ?Patient's cognitive ability adequate to safely complete daily activities?: Yes ?Is the patient deaf or have difficulty  hearing?: No ?Does the patient have difficulty seeing, even when wearing glasses/contacts?: No ?Does the patient have difficulty concentrating, remembering, or making decisions?: Yes ?Patient able to express need for assistance with ADLs?: Yes ?Does the patient have difficulty dressing or bathing?: No ?Independently performs ADLs?: Yes (appropriate for developmental age) ?Does the patient have difficulty walking or climbing stairs?: No ?Weakness of Legs: Both (feet) ?Weakness of Arms/Hands: None ? ?Permission Sought/Granted ?Permission sought to share information with : Case Manager ?Permission granted to share information with : Yes, Verbal Permission Granted ? Share Information with NAME: Case Manager ?   ?   ?   ? ?Emotional Assessment ?Appearance:: Appears stated age ?Attitude/Demeanor/Rapport: Gracious ?Affect (typically observed): Accepting ?Orientation: : Oriented to Self, Oriented to Place, Oriented to  Time, Oriented to Situation ?Alcohol / Substance Use: Not Applicable ?Psych Involvement: No (comment) ? ?Admission diagnosis:  Acute cystitis with hematuria [N30.01] ?Sepsis ([place]) [A41.9] ?Fever, unspecified fever cause [R50.9] ?Altered mental status, unspecified altered mental status type [R41.82] ?Patient Active Problem List  ? Diagnosis Date Noted  ? Port-A-Cath in place 05/30/2021  ? Hypoalbuminemia due to protein-calorie malnutrition ([place]) 05/30/2021  ? Iron deficiency anemia 05/30/2021  ? Hypokalemia 05/30/2021  ? Sepsis secondary to UTI ([place]) 05/26/2021  ? Thrush of mouth and esophagus ([place]) 05/26/2021  ? Lower extremity edema 05/26/2021  ? GERD (gastroesophageal reflux disease) 05/26/2021  ? Malignant neoplasm of prostate metastatic to bone ([place]) 03/23/2021  ? Uncontrolled type 2 diabetes mellitus with hyperglycemia, with long-term current use of insulin ([place])  02/16/2021  ? Complicated UTI (urinary tract infection) 12/07/2020  ? Acute metabolic encephalopathy 83/25/4982  ? Goals of care,  counseling/discussion 05/26/2020  ? Prostate cancer ([place]) 04/13/2019  ? Back pain 03/23/2019  ? Symptomatic anemia 03/23/2019  ? COVID-19 virus infection 03/23/2019  ? Dilation of biliary tract 03/23/2019  ? COPD (chronic obstructive pulmonary disease) ([place]) 03/23/2019  ? Hypertension   ? Dyslipidemia   ? Diabetes mellitus without complication ([place])   ? ?PCP:  [facility] ?Pharmacy:   ?[id number] [id number]?Phone: [phone]: [phone]? ?[place] ?[id number]?Phone: [phone]: [phone]? ?[facility] [id number]?Phone: [phone]: [phone]? ? ? ? ?Social Determinants of Health (SDOH) Interventions ?  ? ?Readmission Risk Interventions ? ?  05/27/2021  ?  2:29 PM  ?Readmission Risk Prevention Plan  ?Transportation Screening Complete  ?PCP or Specialist Appt within 3-5 Days Complete  ?[facility] Home Care Consult Complete  ? ? ? ?

## 2021-05-31 NOTE — Plan of Care (Signed)
?  Problem: Health Behavior/Discharge Planning: ?Goal: Ability to manage health-related needs will improve ?Outcome: Progressing ?  ?Problem: Activity: ?Goal: Risk for activity intolerance will decrease ?Outcome: Progressing ?  ?Problem: Nutrition: ?Goal: Adequate nutrition will be maintained ?Outcome: Progressing ?  ?Problem: Coping: ?Goal: Level of anxiety will decrease ?Outcome: Progressing ?  ?Problem: Elimination: ?Goal: Will not experience complications related to urinary retention ?Outcome: Progressing ?  ?Problem: Safety: ?Goal: Ability to remain free from injury will improve ?Outcome: Progressing ?  ?Problem: Skin Integrity: ?Goal: Risk for impaired skin integrity will decrease ?Outcome: Progressing ?  ?Problem: Elimination: ?Goal: Will not experience complications related to bowel motility ?Outcome: Not Progressing ?  ?Problem: Pain Managment: ?Goal: General experience of comfort will improve ?Outcome: Adequate for Discharge ?  ?Problem: Education: ?Goal: Knowledge of General Education information will improve ?Description: Including pain rating scale, medication(s)/side effects and non-pharmacologic comfort measures ?Outcome: Completed/Met ?  ?Problem: Clinical Measurements: ?Goal: Respiratory complications will improve ?Outcome: Completed/Met ?Goal: Cardiovascular complication will be avoided ?Outcome: Completed/Met ?  ?

## 2021-05-31 NOTE — Progress Notes (Signed)
?  Progress Note ? ? ?Patient: Stephen Lynch PTW:[id number] DOB: Oct 18, 1945 DOA: 05/25/2021     5 ?DOS: the patient was seen and examined on 05/31/2021 ?  ?Brief hospital course: ?76 year old with past medical history significant for diabetes type 2, hypertension, dyslipidemia, metastatic prostate cancer on chemotherapy brought to the hospital by his wife due to poor oral intake for the past few days and fever.  He was noted to have a temperature of 102, tachycardic heart rate 140.  He received IV fluids.  UA was consistent with UTI and he was a started on broad-spectrum antibiotics. ? ?Patient was admitted with sepsis secondary to UTI.  He was treated with IV antibiotics, currently on oral antibiotics. ? ?Patient has been eating more, adequate caloric count. Hb down to 6. Getting blood transfusion.  ? ? ?Assessment and Plan: ?* Sepsis secondary to UTI [facility]) ?Scented with fever, leukocytosis, tachypnea ?Course of infection, UTI, UA with positive nitrates, large leukocytes.  Although urine culture did not have significant growth, patient likely had sepsis from UTI. ?Initially on broad-spectrum antibiotics.  Currently on cefadroxil  to complete 7 days ? ?Hypokalemia ?Replaced.  ? ?Iron deficiency anemia ?Anemia multifactorial, iron deficiency and malignancy.  ?Started ferrous sulfate.  ?Hb down to 6.8.  ?Getting one unit PRBC ? ?Hypoalbuminemia due to protein-calorie malnutrition ([place]) ?In the context of acute illness and malignancy. ?Nutritionist doing calorie counts ?Albumin 1.7 ? ?GERD (gastroesophageal reflux disease) ?Continue with famotidine ? ?Lower extremity edema ?Secondary to hypoalbuminemia ? ?Thrush of mouth and esophagus ([place]) ?Improved started on Diflucan.  Continue ? ?Malignant neoplasm of prostate metastatic to bone [facility]) ?Receive chemotherapy and has received radiation to bilateral shoulders and L3 and L4. ?Follow-up with Dr. Alen Blew. ?Family to reschedule chemotherapy ? ?Uncontrolled type 2 diabetes  mellitus with hyperglycemia, with long-term current use of insulin ([place]) ?Continue with Semglee, and SSI.  ? ?Acute metabolic encephalopathy ?Secondary to fever, sepsis. ?Improved.   Patient now is alert and oriented x3.   ? ? ? ? ?  ? ?Subjective: alert, he is eating more.  ? ?Physical Exam: ?Vitals:  ? 05/31/21 1000 05/31/21 1020 05/31/21 1049 05/31/21 1409  ?BP:  (!) 102/43 120/70 (!) 141/74  ?Pulse: 90 90 91 92  ?Resp:  '18 18 20  '$ ?Temp:  98.8 ?F (37.1 ?C) 98.6 ?F (37 ?C) (!) 97.5 ?F (36.4 ?C)  ?TempSrc:  Oral  Oral  ?SpO2:  100% 100% 100%  ?Weight:      ?Height:      ? ?General; NAD ?Lung; CTA ?Abdomen; soft, nt, nd ?Data Reviewed: ? ?Cbc reviewed.  ? ?Family Communication: significant other over phone.  ? ?Disposition: ?Status is: Inpatient ?Remains inpatient appropriate because: getting blood transfusion  ? Planned Discharge Destination: Home tomorrow if hb stable.  ? ? ? ?Time spent: 35 minutes ? ?Author: ?Elmarie Shiley, MD ?05/31/2021 3:52 PM ? ?For on call review [email or web address].  ?

## 2021-05-31 NOTE — Progress Notes (Signed)
Nutrition Follow-up ? ?DOCUMENTATION CODES:  ? ?Not applicable ? ?INTERVENTION:  ?- continue Boost Breeze TID and 30 ml Prosource Plus BID. ?- d/c Calorie Count. ? ? ?NUTRITION DIAGNOSIS:  ? ?Increased nutrient needs related to acute illness, cancer and cancer related treatments as evidenced by estimated needs. -ongoing ? ?GOAL:  ? ?Patient will meet greater than or equal to 90% of their needs -minimally met with meals and ONS intake ? ?MONITOR:  ? ?PO intake, Supplement acceptance, Labs, Weight trends ? ? ?ASSESSMENT:  ? ?76 y.o. male with medical history of HTN, dyslipidemia, type 2 DM, and metastatic prostate cancer on chemo (last ~2 weeks PTA). He presented to the ED due to increased weakness and confusion, difficulty swallowing, decreased appetite, N/V, BLE swelling, discomfort with urination, and L hip pain. He was admitted with sepsis 2/2 UTI. ? ?Patient sitting up in the chair listening to music at the time of RD visit. From meal ticket and sticky notes in the Calorie Count envelope, he has consumed 1194 kcal (54% kcal) and 22 grams protein (20% protein need). ? ?Review of orders indicates that patient has accepted all bottles of Boost Breeze and all packets of Prosource Plus. If he consumes 100% of all of these, he would take in 950 kcal and 57 grams protein/day from oral nutrition supplements. ? ?Patient denies having any breakfast this AM. He states that he greatly enjoys [employer] and is unable to recall if he's had the Prosource Plus.  ? ?He has not been weighed since 4/15. Moderate pitting edema to BLE documented in the edema section of flow sheet.  ? ? ?Labs reviewed; CBGs: 153 and 219 mg/dl, Ca: 8 mg/dl. ? ?Medications reviewed; 20 mg oral pepcid/day, sliding scale novolog, 325 mg ferrous sulfate/day, 21 units semglee/day, 400 mg megace BID, 1 tablet multivitamin with minerals/day, 1 tablet senokot/day.  ? ? ?Diet Order:   ?Diet Order   ? ?       ?  Diet regular Room service appropriate? Yes;  Fluid consistency: Thin  Diet effective now       ?  ? ?  ?  ? ?  ? ? ?EDUCATION NEEDS:  ? ?No education needs have been identified at this time ? ?Skin:  Skin Assessment: Reviewed RN Assessment ? ?Last BM:  4/17 (type 5 x1, small amount) ? ?Height:  ? ?Ht Readings from Last 1 Encounters:  ?05/26/21 6' 1"  (1.854 m)  ? ? ?Weight:  ? ?Wt Readings from Last 1 Encounters:  ?05/26/21 87.5 kg  ? ? ? ?BMI:  Body mass index is 25.45 kg/m?. ? ?Estimated Nutritional Needs:  ?Kcal:  2200-2400 kcal ?Protein:  110-125 grams ?Fluid:  >/= 2.2 L/day ? ? ? ? ?Jarome Matin, MS, RD, LDN ?Registered Dietitian II ?Inpatient Clinical Nutrition ?RD pager # and on-call/weekend pager # available in [place]  ? ?

## 2021-06-01 DIAGNOSIS — A419 Sepsis, unspecified organism: Secondary | ICD-10-CM | POA: Diagnosis not present

## 2021-06-01 DIAGNOSIS — N390 Urinary tract infection, site not specified: Secondary | ICD-10-CM | POA: Diagnosis not present

## 2021-06-01 LAB — CBC
HCT: 26.5 % — ABNORMAL LOW (ref 39.0–52.0)
Hemoglobin: 8.6 g/dL — ABNORMAL LOW (ref 13.0–17.0)
MCH: 29 pg (ref 26.0–34.0)
MCHC: 32.5 g/dL (ref 30.0–36.0)
MCV: 89.2 fL (ref 80.0–100.0)
Platelets: 287 10*3/uL (ref 150–400)
RBC: 2.97 MIL/uL — ABNORMAL LOW (ref 4.22–5.81)
RDW: 21.2 % — ABNORMAL HIGH (ref 11.5–15.5)
WBC: 8.1 10*3/uL (ref 4.0–10.5)
nRBC: 0.4 % — ABNORMAL HIGH (ref 0.0–0.2)

## 2021-06-01 LAB — TYPE AND SCREEN
ABO/RH(D): AB POS
Antibody Screen: NEGATIVE
Unit division: 0

## 2021-06-01 LAB — BPAM RBC
Blood Product Expiration Date: 202305042359
ISSUE DATE / TIME: 202304201029
Unit Type and Rh: 6200

## 2021-06-01 LAB — GLUCOSE, CAPILLARY
Glucose-Capillary: 197 mg/dL — ABNORMAL HIGH (ref 70–99)
Glucose-Capillary: 211 mg/dL — ABNORMAL HIGH (ref 70–99)
Glucose-Capillary: 222 mg/dL — ABNORMAL HIGH (ref 70–99)

## 2021-06-01 MED ORDER — HUMALOG KWIKPEN 100 UNIT/ML ~~LOC~~ SOPN: 3.0000 [IU] | PEN_INJECTOR | Freq: Three times a day (TID) | SUBCUTANEOUS | 11 refills | Status: AC

## 2021-06-01 MED ORDER — TRESIBA FLEXTOUCH 100 UNIT/ML ~~LOC~~ SOPN: 20.0000 [IU] | PEN_INJECTOR | Freq: Every day | SUBCUTANEOUS | 0 refills | Status: AC

## 2021-06-01 MED ORDER — FERROUS SULFATE 325 (65 FE) MG PO TABS: 325.0000 mg | ORAL_TABLET | Freq: Every day | ORAL | 3 refills | Status: AC

## 2021-06-01 MED ORDER — MEGESTROL ACETATE 400 MG/10ML PO SUSP: 400.0000 mg | Freq: Two times a day (BID) | ORAL | 0 refills | Status: AC

## 2021-06-01 MED ORDER — SENNOSIDES-DOCUSATE SODIUM 8.6-50 MG PO TABS
1.0000 | ORAL_TABLET | Freq: Every day | ORAL | 0 refills | Status: DC
Start: 2021-06-02 — End: 2021-07-10

## 2021-06-01 MED ORDER — FLUCONAZOLE 200 MG PO TABS
200.0000 mg | ORAL_TABLET | Freq: Every day | ORAL | 0 refills | Status: AC
Start: 2021-06-02 — End: 2021-06-12

## 2021-06-01 MED ORDER — CEFADROXIL 500 MG PO CAPS: 500.0000 mg | ORAL_CAPSULE | Freq: Two times a day (BID) | ORAL | 0 refills | Status: AC

## 2021-06-01 MED ORDER — PROSOURCE PLUS PO LIQD: 30.0000 mL | Freq: Two times a day (BID) | ORAL | 0 refills | Status: AC

## 2021-06-01 MED ORDER — NYSTATIN 100000 UNIT/ML MT SUSP: 5.0000 mL | Freq: Four times a day (QID) | OROMUCOSAL | 0 refills | Status: AC

## 2021-06-01 NOTE — Plan of Care (Deleted)
°  Problem: Respiratory: °Goal: Ability to maintain adequate ventilation will improve °Outcome: Progressing °  °

## 2021-06-01 NOTE — Discharge Summary (Signed)
?Physician Discharge Summary ?  ?Patient: Stephen Lynch MRN: [id number] DOB: 1945/08/07  ?Admit date:     05/25/2021  ?Discharge date: 06/01/21  ?Discharge Physician: Jerald Kief A Quamere Mussell  ? ?PCP: [facility]  ? ?Recommendations at discharge:  ? ? Follow up resolution UTI ?Follow up with Dr Alen Blew.  ? ?Discharge Diagnoses: ?Principal Problem: ?  Sepsis secondary to UTI [facility]) ?Active Problems: ?  Acute metabolic encephalopathy ?  Uncontrolled type 2 diabetes mellitus with hyperglycemia, with long-term current use of insulin ([place]) ?  Malignant neoplasm of prostate metastatic to bone [facility]) ?  Thrush of mouth and esophagus ([place]) ?  Lower extremity edema ?  GERD (gastroesophageal reflux disease) ?  Hypoalbuminemia due to protein-calorie malnutrition ([place]) ?  Iron deficiency anemia ?  Hypokalemia ? ?Resolved Problems: ?  * No resolved hospital problems. * ? ?Hospital Course: ?76 year old with past medical history significant for diabetes type 2, hypertension, dyslipidemia, metastatic prostate cancer on chemotherapy brought to the hospital by his wife due to poor oral intake for the past few days and fever.  He was noted to have a temperature of 102, tachycardic heart rate 140.  He received IV fluids.  UA was consistent with UTI and he was a started on broad-spectrum antibiotics. ? ?Patient was admitted with sepsis secondary to UTI.  He was treated with IV antibiotics, currently on oral antibiotics. ? ?Patient has been eating more, adequate caloric count. Hb down to 6. Getting blood transfusion.  ? ? ?Assessment and Plan: ?* Sepsis secondary to UTI [facility]) ?Scented with fever, leukocytosis, tachypnea ?Course of infection, UTI, UA with positive nitrates, large leukocytes.  Although urine culture did not have significant growth, patient likely had sepsis from UTI. ?Initially on broad-spectrum antibiotics.  Currently on cefadroxil  to complete 7 days. Discharge on 2 more days.  ? ?Hypokalemia ?Replaced.  ? ?Iron  deficiency anemia ?Anemia multifactorial, iron deficiency and malignancy.  ?Started ferrous sulfate.  ?Hb down to 6.8.  ?Received one unit PRBC. Hb post transfusion at .  ? ?Hypoalbuminemia due to protein-calorie malnutrition ([place]) ?In the context of acute illness and malignancy. ?Nutritionist doing calorie counts ?Albumin 1.7 ? ?GERD (gastroesophageal reflux disease) ?Continue with famotidine ? ?Lower extremity edema ?Secondary to hypoalbuminemia ? ?Thrush of mouth and esophagus ([place]) ?Improved started on Diflucan.  Continue ? ?Malignant neoplasm of prostate metastatic to bone [facility]) ?Receive chemotherapy and has received radiation to bilateral shoulders and L3 and L4. ?Follow-up with Dr. Alen Blew. ?Family to reschedule chemotherapy ? ?Uncontrolled type 2 diabetes mellitus with hyperglycemia, with long-term current use of insulin ([place]) ?Continue with Semglee, and SSI.  ? ?Acute metabolic encephalopathy ?Secondary to fever, sepsis. ?Improved.   Patient now is alert and oriented x3.   ? ? ? ? ?  ? ? ?Consultants: none ?Procedures performed: none ?Disposition: Home ?Diet recommendation:  ?Discharge Diet Orders (From admission, onward)  ? ?  Start     Ordered  ? 06/01/21 0000  Diet general       ? 06/01/21 1006  ? ?  ?  ? ?  ? ?Cardiac diet ?DISCHARGE MEDICATION: ?Allergies as of 06/01/2021   ? ?   Reactions  ? Chlorhexidine   ? Unknown reaction  ? ?  ? ?  ?Medication List  ?  ? ?STOP taking these medications   ? ?B-D ULTRAFINE III SHORT PEN 31G X 8 MM Misc ?Generic drug: Insulin Pen Needle ?  ? ?  ? ?TAKE these medications   ? ?(  feeding supplement) PROSource Plus liquid ?Take 30 mLs by mouth 2 (two) times daily between meals. ?  ?acetaminophen 500 MG tablet ?Commonly known as: TYLENOL ?Take 1,000 mg by mouth daily as needed for moderate pain. ?  ?ASPIRIN 81 PO ?Take 81 mg by mouth daily. ?  ?Calcium-Magnesium-Zinc 333-133-5 MG Tabs ?Take 1 tablet by mouth at bedtime. ?  ?cefadroxil 500 MG capsule ?Commonly known as:  DURICEF ?Take 1 capsule (500 mg total) by mouth 2 (two) times daily for 2 days. ?  ?famotidine 20 MG tablet ?Commonly known as: PEPCID ?Take 20 mg by mouth daily. ?  ?ferrous sulfate 325 (65 FE) MG tablet ?Take 1 tablet (325 mg total) by mouth daily. ?Start taking on: June 02, 2021 ?  ?fluconazole 200 MG tablet ?Commonly known as: DIFLUCAN ?Take 1 tablet (200 mg total) by mouth daily for 10 days. ?Start taking on: June 02, 2021 ?  ?FreeStyle Libre 2 Sensor Misc ?APPLY EVERY 14 DAYS, CHECK GLUCOSE BEFORE EACH MEAL, 2 HRS AFTER, IN THE MORNING AND BEFORE BEDTIME ?  ?HumaLOG KwikPen 100 UNIT/ML KwikPen ?Generic drug: insulin lispro ?Inject 3-11 Units into the skin in the morning, at noon, and at bedtime. CBG 70 - 120: 0 units  ?CBG 121 - 150: 3 units  ?CBG 151 - 200: 4 units  ?CBG 201 - 250: 7 units  ?CBG 251 - 300: 11 units  ? ?CBG more than 350 call PCP ?What changed:  ?how much to take ?how to take this ?additional instructions ?  ?lidocaine 5 % ?Commonly known as: LIDODERM ?Place 2 patches onto the skin daily. Remove & Discard patch within 12 hours or as directed by MD ?  ?lidocaine-prilocaine cream ?Commonly known as: EMLA ?Apply 1 application topically as needed. ?  ?loperamide 1 MG/5ML solution ?Commonly known as: IMODIUM ?Take 2 mg by mouth daily as needed for diarrhea or loose stools. ?  ?magnesium 30 MG tablet ?Take 30 mg by mouth daily. ?  ?megestrol 400 MG/10ML suspension ?Commonly known as: MEGACE ?Take 10 mLs (400 mg total) by mouth 2 (two) times daily. ?  ?nystatin 100000 UNIT/ML suspension ?Commonly known as: MYCOSTATIN ?Take 5 mLs (500,000 Units total) by mouth 4 (four) times daily. ?  ?ondansetron 4 MG tablet ?Commonly known as: ZOFRAN ?Take 1 tablet (4 mg total) by mouth every 6 (six) hours as needed. for nausea ?  ?Oxycodone HCl 10 MG Tabs ?Take 1 tablet (10 mg total) by mouth every 4 (four) hours as needed. ?What changed: reasons to take this ?  ?prochlorperazine 10 MG tablet ?Commonly known as:  COMPAZINE ?Take 1 tablet (10 mg total) by mouth every 6 (six) hours as needed. ?  ?rosuvastatin 10 MG tablet ?Commonly known as: CRESTOR ?Take 1 tablet (10 mg total) by mouth daily. ?What changed: when to take this ?  ?senna-docusate 8.6-50 MG tablet ?Commonly known as: Senokot-S ?Take 1 tablet by mouth daily. ?Start taking on: June 02, 2021 ?  ?TIGER BALM ARTHRITIS RUB EX ?Apply 1 application topically 2 (two) times daily as needed (back pain). ?  ?[name] FlexTouch 100 UNIT/ML FlexTouch Pen ?Generic drug: insulin degludec ?Inject 20 Units into the skin at bedtime. ?What changed: how much to take ?  ? ?  ? ? Follow-up Information   ? ? [facility] Follow up in 1 week(s).   ?Contact information: ?[address] ?[place] [address] ?[phone] ? ? ?  ?  ? ? [name], MD Follow up in  1 week(s).   ?Specialty: Oncology ?Contact information: ?[address] ?[place] [address] ?[phone] ? ? ?  ?  ? ? [place] Follow up.   ?Why: [facility] physical therapy/occupational therapy ?Contact information: ?[address] ?[place] [address] ?[phone] ? ? ?  ?  ? ?  ?  ? ?  ? ?Discharge Exam: ?Filed Weights  ? 05/25/21 2307 05/26/21 1723  ?Weight: 87.5 kg 87.5 kg  ? ?General; NAD ?Lung; CTA ? ?Condition at discharge: stable ? ?The results of significant diagnostics from this hospitalization (including imaging, microbiology, ancillary and laboratory) are listed below for reference.  ? ?Imaging Studies: ?CT HEAD WO CONTRAST (5MM) ? ?Result Date: 05/26/2021 ?CLINICAL DATA:  Delirium, neck pain. EXAM: CT HEAD WITHOUT CONTRAST CT CERVICAL SPINE WITHOUT CONTRAST TECHNIQUE: Multidetector CT imaging of the head and cervical spine was performed following the standard protocol without intravenous contrast. Multiplanar CT image reconstructions of the cervical spine were also generated. RADIATION DOSE REDUCTION: This exam was performed according to the departmental dose-optimization program which  includes automated exposure control, adjustment of the mA and/or kV according to patient size and/or use of iterative reconstruction technique. COMPARISON:  03/23/2019, 12/07/2020. FINDINGS: CT HEAD FINDINGS B

## 2021-06-01 NOTE — Plan of Care (Addendum)
?  Problem: Coping: ?Goal: Level of anxiety will decrease ?Outcome: Progressing ?  ?Problem: Elimination: ?Goal: Will not experience complications related to urinary retention ?Outcome: Progressing ?  ?Problem: Pain Managment: ?Goal: General experience of comfort will improve ?Outcome: Progressing ?  ?Problem: Safety: ?Goal: Ability to remain free from injury will improve ?Outcome: Progressing ?  ?Problem: Skin Integrity: ?Goal: Risk for impaired skin integrity will decrease ?Outcome: Progressing ? ?Problem: Respiratory: ?Goal: Ability to maintain adequate ventilation will improve ?Outcome: Progressing ?  ?

## 2021-06-01 NOTE — Progress Notes (Signed)
Patient discharged home, discharge instructions given and explained to patient and [name], they verbalized understanding, patient denies any pain/distress. No pressure injury noted. Patient accompanied home by significant Reece Agar.   ?

## 2021-06-01 NOTE — TOC Transition Note (Signed)
Transition of Care (TOC) - CM/SW Discharge Note ? ? ?Patient Details  ?Name: Stephen Lynch ?MRN: [id number] ?Date of Birth: 1945/05/22 ? ?[facility] CM/SW Contact:  ?Dessa Phi, RN ?Phone Number: ?06/01/2021, 11:00 AM ? ? ?Clinical Narrative:  d/c home w/HHC. No further CM needs.  ? ? ? ?Final next level of care: [place] ?Barriers to Discharge: No Barriers Identified ? ? ?Patient Goals and CMS Choice ?Patient states their goals for this hospitalization and ongoing recovery are:: Home ?CMS Medicare.gov Compare Post Acute Care list provided to:: Patient Represenative (must comment) [name] s/o) ?Choice offered to / list presented to :  [name] s/o) ? ?Discharge Placement ?  ?           ?  ?  ?  ?  ? ?Discharge Plan and Services ?  ?Discharge Planning Services: CM Consult ?Post Acute Care Choice: Home Health          ?  ?  ?  ?  ?  ?HH Arranged: PT, OT ?[place] Agency: [place] ?Date HH Agency Contacted: 05/31/21 ?Time [place]: [id number] ?Representative spoke with at [place]: Malachy Mood ? ?Social Determinants of Health (SDOH) Interventions ?  ? ? ?Readmission Risk Interventions ? ?  05/27/2021  ?  2:29 PM  ?Readmission Risk Prevention Plan  ?Transportation Screening Complete  ?PCP or Specialist Appt within 3-5 Days Complete  ?[place] or Home Care Consult Complete  ? ? ? ? ? ?

## 2021-06-01 NOTE — Progress Notes (Signed)
Physical Therapy Treatment ?Patient Details ?Name: Stephen Lynch ?MRN: [id number] ?DOB: 01/20/1946 ?Today's Date: 06/01/2021 ? ? ?History of Present Illness Patient is a 76 year old male who presented to the hosptial with confusion, fever and weakness. patient was found to have sepsis secondary to UTI. 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 ?  ?PT Comments  ? ? Patient is making good progress with mobility and initiated gait with HHA. He required min assist to steady without walker but overall step length improved. EOS completed sit<>stands for functional LE strengthening. EOB elevated to reduce pt's need for UE's to power up. He will continue to benefit from skilled PT interventions to progress mobility towards PLOF. ? ?  ?Recommendations for follow up therapy are one component of a multi-disciplinary discharge planning process, led by the attending physician.  Recommendations may be updated based on patient status, additional functional criteria and insurance authorization. ? ?Follow Up Recommendations ? Home health PT ?  ?  ?Assistance Recommended at Discharge Intermittent Supervision/Assistance  ?Patient can return home with the following Help with stairs or ramp for entrance;Assist for transportation;Assistance with cooking/housework ?  ?Equipment Recommendations ? None recommended by PT  ?  ?Recommendations for Other Services   ? ? ?  ?Precautions / Restrictions Precautions ?Precautions: Fall ?Restrictions ?Weight Bearing Restrictions: No  ?  ? ?Mobility ? Bed Mobility ?Overal bed mobility: Needs Assistance ?Bed Mobility: Supine to Sit ?  ?  ?Supine to sit: Min assist ?  ?  ?General bed mobility comments: assist to progress LE's off the bed and to reach for bed rail to sit up. ?  ? ?Transfers ?Overall transfer level: Needs assistance ?Equipment used: Rolling walker (2 wheels) ?Transfers:  Sit to/from Stand ?Sit to Stand: Min guard, Min assist, From elevated surface ?  ?  ?  ?  ?  ?General transfer comment: close guard and cues for hand placement, pt able to power up with bil UE and min assist for sit<>stand with no UE use from elevated surface ?  ? ?Ambulation/Gait ?Ambulation/Gait assistance: Min guard ?Gait Distance (Feet): 170 Feet ?Assistive device: Rolling walker (2 wheels), 1 person hand held assist ?Gait Pattern/deviations: Step-through pattern, Decreased stride length, Trunk flexed ?Gait velocity: decr ?  ?  ?General Gait Details: min guard and cues for hand placement and proximity to RW during first half of gait. Pt ambulated second half with 1HHA and step length slightly increased, no LOB noted throughout. ? ? ?Stairs ?  ?  ?  ?  ?  ? ? ?Wheelchair Mobility ?  ? ?Modified Rankin (Stroke Patients Only) ?  ? ? ?  ?Balance Overall balance assessment: Mild deficits observed, not formally tested ?  ?  ?  ?  ?  ?  ?  ?  ?  ?  ?  ?  ?  ?  ?  ?  ?  ?  ?  ? ?  ?Cognition Arousal/Alertness: Awake/alert ?Behavior During Therapy: [facility] for tasks assessed/performed, Flat affect ?Overall Cognitive Status: Within Functional Limits for tasks assessed ?  ?  ?  ?  ?  ?  ?  ?  ?  ?  ?  ?  ?  ?  ?  ?  ?  ?  ?  ? ?  ?Exercises   ? ?  ?General Comments   ?  ?  ? ?Pertinent Vitals/Pain Pain Assessment ?  Pain Assessment: No/denies pain  ? ? ?Home Living   ?  ?  ?  ?  ?  ?  ?  ?  ?  ?   ?  ?Prior Function    ?  ?  ?   ? ?PT Goals (current goals can now be found in the care plan section) Acute Rehab PT Goals ?Patient Stated Goal: feel better ?PT Goal Formulation: With patient ?Time For Goal Achievement: 06/10/21 ?Potential to Achieve Goals: Good ?Progress towards PT goals: Progressing toward goals ? ?  ?Frequency ? ? ? Min 3X/week ? ? ? ?  ?PT Plan Current plan remains appropriate  ? ? ?Co-evaluation   ?  ?  ?  ?  ? ?  ?AM-PAC PT "6 Clicks" Mobility   ?Outcome Measure ? Help needed turning from your back to your side  while in a flat bed without using bedrails?: A Little ?Help needed moving from lying on your back to sitting on the side of a flat bed without using bedrails?: A Little ?Help needed moving to and from a bed to a chair (including a wheelchair)?: A Little ?Help needed standing up from a chair using your arms (e.g., wheelchair or bedside chair)?: A Little ?Help needed to walk in hospital room?: A Little ?Help needed climbing 3-5 steps with a railing? : A Lot ?6 Click Score: 17 ? ?  ?End of Session Equipment Utilized During Treatment: Gait belt ?Activity Tolerance: Patient tolerated treatment well ?Patient left: in chair ?Nurse Communication: Mobility status ?PT Visit Diagnosis: Other abnormalities of gait and mobility (R26.89);Difficulty in walking, not elsewhere classified (R26.2) ?  ? ? ?Time: 1308-6578 ?PT Time Calculation (min) (ACUTE ONLY): 22 min ? ?Charges:  $Gait Training: 8-22 mins          ?          ? ?Gwynneth Albright PT, DPT ?Acute Rehabilitation Services ?Office [phone] ?Pager [phone]  ? ? ?[name] ?06/01/2021, 1:00 PM ? ?

## 2021-06-02 ENCOUNTER — Inpatient Hospital Stay: Payer: Medicare Other

## 2021-06-04 ENCOUNTER — Other Ambulatory Visit: Payer: Self-pay

## 2021-06-04 ENCOUNTER — Inpatient Hospital Stay: Payer: Medicare Other

## 2021-06-04 ENCOUNTER — Other Ambulatory Visit: Payer: Self-pay | Admitting: *Deleted

## 2021-06-04 ENCOUNTER — Other Ambulatory Visit (HOSPITAL_COMMUNITY): Payer: Medicare Other

## 2021-06-04 ENCOUNTER — Inpatient Hospital Stay (HOSPITAL_BASED_OUTPATIENT_CLINIC_OR_DEPARTMENT_OTHER): Payer: Medicare Other | Admitting: Oncology

## 2021-06-04 ENCOUNTER — Inpatient Hospital Stay: Payer: Medicare Other | Attending: Oncology

## 2021-06-04 VITALS — BP 137/79 | HR 85 | Temp 97.8°F | Resp 19 | Ht 73.0 in | Wt 206.4 lb

## 2021-06-04 DIAGNOSIS — Z923 Personal history of irradiation: Secondary | ICD-10-CM | POA: Diagnosis not present

## 2021-06-04 DIAGNOSIS — C61 Malignant neoplasm of prostate: Secondary | ICD-10-CM | POA: Diagnosis not present

## 2021-06-04 DIAGNOSIS — Z5189 Encounter for other specified aftercare: Secondary | ICD-10-CM | POA: Insufficient documentation

## 2021-06-04 DIAGNOSIS — Z79899 Other long term (current) drug therapy: Secondary | ICD-10-CM | POA: Insufficient documentation

## 2021-06-04 DIAGNOSIS — Z5111 Encounter for antineoplastic chemotherapy: Secondary | ICD-10-CM | POA: Diagnosis not present

## 2021-06-04 DIAGNOSIS — D709 Neutropenia, unspecified: Secondary | ICD-10-CM | POA: Diagnosis not present

## 2021-06-04 DIAGNOSIS — D649 Anemia, unspecified: Secondary | ICD-10-CM | POA: Diagnosis not present

## 2021-06-04 DIAGNOSIS — C7951 Secondary malignant neoplasm of bone: Secondary | ICD-10-CM | POA: Diagnosis not present

## 2021-06-04 DIAGNOSIS — Z95828 Presence of other vascular implants and grafts: Secondary | ICD-10-CM | POA: Diagnosis not present

## 2021-06-04 LAB — CMP (CANCER CENTER ONLY)
ALT: 22 U/L (ref 0–44)
AST: 53 U/L — ABNORMAL HIGH (ref 15–41)
Albumin: 2.4 g/dL — ABNORMAL LOW (ref 3.5–5.0)
Alkaline Phosphatase: 403 U/L — ABNORMAL HIGH (ref 38–126)
Anion gap: 6 (ref 5–15)
BUN: 9 mg/dL (ref 8–23)
CO2: 27 mmol/L (ref 22–32)
Calcium: 7.7 mg/dL — ABNORMAL LOW (ref 8.9–10.3)
Chloride: 107 mmol/L (ref 98–111)
Creatinine: 0.68 mg/dL (ref 0.61–1.24)
GFR, Estimated: >60 mL/min (ref >60)
Glucose, Bld: 159 mg/dL — ABNORMAL HIGH (ref 70–99)
Potassium: 3.6 mmol/L (ref 3.5–5.1)
Sodium: 140 mmol/L (ref 135–145)
Total Bilirubin: 0.6 mg/dL (ref 0.3–1.2)
Total Protein: 6.2 g/dL — ABNORMAL LOW (ref 6.5–8.1)

## 2021-06-04 LAB — CBC WITH DIFFERENTIAL (CANCER CENTER ONLY)
Abs Immature Granulocytes: 0.05 10*3/uL (ref 0.00–0.07)
Basophils Absolute: 0 10*3/uL (ref 0.0–0.1)
Basophils Relative: 1 %
Eosinophils Absolute: 0.1 10*3/uL (ref 0.0–0.5)
Eosinophils Relative: 2 %
HCT: 30.3 % — ABNORMAL LOW (ref 39.0–52.0)
Hemoglobin: 9.6 g/dL — ABNORMAL LOW (ref 13.0–17.0)
Immature Granulocytes: 1 %
Lymphocytes Relative: 8 %
Lymphs Abs: 0.7 10*3/uL (ref 0.7–4.0)
MCH: 28 pg (ref 26.0–34.0)
MCHC: 31.7 g/dL (ref 30.0–36.0)
MCV: 88.3 fL (ref 80.0–100.0)
Monocytes Absolute: 0.6 10*3/uL (ref 0.1–1.0)
Monocytes Relative: 7 %
Neutro Abs: 6.4 10*3/uL (ref 1.7–7.7)
Neutrophils Relative %: 81 %
Platelet Count: 358 10*3/uL (ref 150–400)
RBC: 3.43 MIL/uL — ABNORMAL LOW (ref 4.22–5.81)
RDW: 20.9 % — ABNORMAL HIGH (ref 11.5–15.5)
WBC Count: 7.8 10*3/uL (ref 4.0–10.5)
nRBC: 0 % (ref 0.0–0.2)

## 2021-06-04 LAB — SAMPLE TO BLOOD BANK

## 2021-06-04 MED ORDER — FAMOTIDINE IN NACL 20-0.9 MG/50ML-% IV SOLN
20.0000 mg | Freq: Once | INTRAVENOUS | Status: AC
  Administered 2021-06-04: 20 mg via INTRAVENOUS
  Filled 2021-06-04: qty 50

## 2021-06-04 MED ORDER — DIPHENHYDRAMINE HCL 50 MG/ML IJ SOLN
25.0000 mg | Freq: Once | INTRAMUSCULAR | Status: AC
  Administered 2021-06-04: 25 mg via INTRAVENOUS
  Filled 2021-06-04: qty 1

## 2021-06-04 MED ORDER — SODIUM CHLORIDE 0.9 % IV SOLN
20.0000 mg/m2 | Freq: Once | INTRAVENOUS | Status: AC
  Administered 2021-06-04: 44 mg via INTRAVENOUS
  Filled 2021-06-04: qty 4.4

## 2021-06-04 MED ORDER — HEPARIN SOD (PORK) LOCK FLUSH 100 UNIT/ML IV SOLN
500.0000 [IU] | Freq: Once | INTRAVENOUS | Status: AC | PRN
  Administered 2021-06-04: 500 [IU]

## 2021-06-04 MED ORDER — SODIUM CHLORIDE 0.9 % IV SOLN
10.0000 mg | Freq: Once | INTRAVENOUS | Status: AC
  Administered 2021-06-04: 10 mg via INTRAVENOUS
  Filled 2021-06-04: qty 10

## 2021-06-04 MED ORDER — SODIUM CHLORIDE 0.9% FLUSH
10.0000 mL | INTRAVENOUS | Status: DC | PRN
  Administered 2021-06-04: 10 mL

## 2021-06-04 MED ORDER — SODIUM CHLORIDE 0.9 % IV SOLN: Freq: Once | INTRAVENOUS | Status: AC

## 2021-06-04 NOTE — Progress Notes (Signed)
Hematology and Oncology Follow Up Visit ? ?Stephen Lynch ?[id number] ?1945/10/02 76 y.o. ?06/04/2021 10:36 AM ?[facility]  ? ?Principle Diagnosis: 76 year old man with advanced prostate cancer with disease to the bone diagnosed in February 2021.  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 ? ?Prior Therapy: ? ?He status post CT-guided biopsy of the right iliac bone. ? ?[name] started on April 20, 2019. ? ?Xtandi 160 mg daily started in April 2021.  Therapy discontinued in March 2022 due to progression of disease. ? ?Zytiga 1000 mg daily with prednisone 5 mg daily started in March 2022. ? ?Taxotere chemotherapy 75 mg per metered square started on June 08, 2020.  He is here for cycle 9 of therapy. ? ?Radiation therapy to L3 and L4 bilateral shoulders completed in February 2023.  He received 30 Gray in 10 fractions. ? ?Current therapy:  ? ? Eligard 30 mg started in April 2021.  Next Eligard will be given in April 2023. ? ? ?Jevtana chemotherapy 20 mg per metered square every 3 weeks started on December 28, 2020.  He is status post 7 cycles of therapy. ? ? ? ? ?Interim History: Mr. [name] is here for a follow-up visit.  Since the last visit, he was hospitalized between April 14 and April 21 for urinary tract infection and anemia.  Since his discharge, he reports no major changes in his health.  He reports no increased pain at this time.  He denies any fevers chills or sweats.  He has completed his antibiotic course.  His mobility has been limited with lower extremity edema and overall fatigue.  His performance status and quality of life remains unchanged. ? ?Medications: Reviewed without changes. ?Current Outpatient Medications  ?Medication Sig Dispense Refill  ? acetaminophen (TYLENOL) 500 MG tablet Take 1,000 mg by mouth daily as needed for moderate pain.    ? ASPIRIN 81 PO Take 81 mg by mouth daily.     ? Calcium-Magnesium-Zinc 333-133-5 MG TABS Take 1 tablet by mouth at  bedtime.    ? Continuous Blood Gluc Sensor (FREESTYLE LIBRE 2 SENSOR) MISC APPLY EVERY 14 DAYS, CHECK GLUCOSE BEFORE EACH MEAL, 2 HRS AFTER, IN THE MORNING AND BEFORE BEDTIME 2 each 2  ? famotidine (PEPCID) 20 MG tablet Take 20 mg by mouth daily.    ? ferrous sulfate 325 (65 FE) MG tablet Take 1 tablet (325 mg total) by mouth daily. 30 tablet 3  ? fluconazole (DIFLUCAN) 200 MG tablet Take 1 tablet (200 mg total) by mouth daily for 10 days. 10 tablet 0  ? HUMALOG KWIKPEN 100 UNIT/ML KwikPen Inject 3-11 Units into the skin in the morning, at noon, and at bedtime. CBG 70 - 120: 0 units  ?CBG 121 - 150: 3 units  ?CBG 151 - 200: 4 units  ?CBG 201 - 250: 7 units  ?CBG 251 - 300: 11 units  ? ?CBG more than 350 call PCP 15 mL 11  ? lidocaine (LIDODERM) 5 % Place 2 patches onto the skin daily. Remove & Discard patch within 12 hours or as directed by MD 30 patch 0  ? lidocaine-prilocaine (EMLA) cream Apply 1 application topically as needed. 30 g 0  ? loperamide (IMODIUM) 1 MG/5ML solution Take 2 mg by mouth daily as needed for diarrhea or loose stools.    ? magnesium 30 MG tablet Take 30 mg by mouth daily.    ? megestrol (MEGACE) 400 MG/10ML suspension Take 10 mLs (400 mg total) by mouth  2 (two) times daily. 240 mL 0  ? Menthol-Camphor (TIGER BALM ARTHRITIS RUB EX) Apply 1 application topically 2 (two) times daily as needed (back pain).    ? Nutritional Supplements (,FEEDING SUPPLEMENT, PROSOURCE PLUS) liquid Take 30 mLs by mouth 2 (two) times daily between meals. 100 mL 0  ? nystatin (MYCOSTATIN) 100000 UNIT/ML suspension Take 5 mLs (500,000 Units total) by mouth 4 (four) times daily. 60 mL 0  ? ondansetron (ZOFRAN) 4 MG tablet Take 1 tablet (4 mg total) by mouth every 6 (six) hours as needed. for nausea 20 tablet 3  ? Oxycodone HCl 10 MG TABS Take 1 tablet (10 mg total) by mouth every 4 (four) hours as needed. (Patient taking differently: Take 10 mg by mouth every 4 (four) hours as needed (for severe pain).) 90 tablet 0  ?  prochlorperazine (COMPAZINE) 10 MG tablet Take 1 tablet (10 mg total) by mouth every 6 (six) hours as needed. 30 tablet 3  ? rosuvastatin (CRESTOR) 10 MG tablet Take 1 tablet (10 mg total) by mouth daily. (Patient taking differently: Take 10 mg by mouth every evening.) 90 tablet 3  ? senna-docusate (SENOKOT-S) 8.6-50 MG tablet Take 1 tablet by mouth daily. 30 tablet 0  ? TRESIBA FLEXTOUCH 100 UNIT/ML FlexTouch Pen Inject 20 Units into the skin at bedtime. 15 mL 0  ? ?No current facility-administered medications for this visit.  ? ? ? ?Allergies:  ?Allergies  ?Allergen Reactions  ? Chlorhexidine   ?  Unknown reaction  ? ? ? ? ?Physical Exam: ? ?Blood pressure 137/79, pulse 85, temperature 97.8 ?F (36.6 ?C), temperature source Temporal, resp. rate 19, height '6\' 1"'$  (1.854 m), weight 206 lb 6.4 oz (93.6 kg), SpO2 100 %. ? ? ?Height '6\' 1"'$  (1.854 m). ? ? ? ?ECOG: 2 ?  ? ? ?General appearance: Comfortable appearing without any discomfort ?Head: Normocephalic without any trauma ?Oropharynx: Mucous membranes are moist and pink without any thrush or ulcers. ?Eyes: Pupils are equal and round reactive to light. ?Lymph nodes: No cervical, supraclavicular, inguinal or axillary lymphadenopathy.   ?Heart:regular rate and rhythm.  S1 and S2.  1+ edema level of the ankle ?Lung: Clear without any rhonchi or wheezes.  No dullness to percussion. ?Abdomin: Soft, nontender, nondistended with good bowel sounds.  No hepatosplenomegaly. ?Musculoskeletal: No joint deformity or effusion.  Full range of motion noted. ?Neurological: No deficits noted on motor, sensory and deep tendon reflex exam. ?Skin: No petechial rash or dryness.  Appeared moist.  ?Psychiatric: Mood and affect appeared appropriate. ? ? ? ? ? ? ? ? ? ? ? ? ? ? ? ? ? ? ?Lab Results: ?Lab Results  ?Component Value Date  ? WBC 7.8 06/04/2021  ? HGB 9.6 (L) 06/04/2021  ? HCT 30.3 (L) 06/04/2021  ? MCV 88.3 06/04/2021  ? PLT 358 06/04/2021  ? ?  Chemistry   ?   ?Component Value  Date/Time  ? NA 139 05/31/2021 0507  ? K 3.7 05/31/2021 0507  ? CL 109 05/31/2021 0507  ? CO2 25 05/31/2021 0507  ? BUN 11 05/31/2021 0507  ? CREATININE 0.70 05/31/2021 0507  ? CREATININE 0.70 05/03/2021 1121  ?    ?Component Value Date/Time  ? CALCIUM 8.0 (L) 05/31/2021 0507  ? ALKPHOS 354 (H) 05/30/2021 7591  ? AST 58 (H) 05/30/2021 0512  ? AST 62 (H) 05/03/2021 1121  ? ALT 19 05/30/2021 0512  ? ALT 19 05/03/2021 1121  ? BILITOT 0.3 05/30/2021 0512  ?  BILITOT 1.0 05/03/2021 1121  ?  ? ? ? ? ? ? Latest Reference Range & Units 03/01/21 08:17 03/22/21 09:44 04/11/21 11:11 05/03/21 11:21  ?Prostate Specific Ag, Serum 0.0 - 4.0 ng/mL 463.0 (H) 490.0 (H) 241.0 (H) 365.0 (H)  ?(H): Data is abnormally high ? ?Impression and Plan: ? ? ?76 year old man with: ? ?1.  Prostate cancer diagnosed in 2021.  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  ? ?He is currently on Jevtana chemotherapy without any major complications.  Risks and benefits of proceeding with treatment were discussed.  He is quite debilitated but still has a reasonable performance status and would like to continue with treatment.  Complications that include nausea, vomiting, myelosuppression were reiterated.  He is agreeable to proceed. ? ? ?2.  Androgen deprivation: He received Eligard on May 07, 2021 and will be repeated in 4 months. ?  ?3.  Growth factor support: He will continue to receive growth factor support given his risk of neutropenia after each cycle of chemotherapy. ? ?4.  Prognosis and goals of care: Therapy remains palliative though aggressive measures are warranted given his reasonable performance status. ? ? ?5.  Anemia: Hemoglobin is adequate today after receiving transfusion during his hospitalization. ? ?6.  Bone pain: He continues to be on oxycodone with pain is manageable. ? ?7.  IV access: Port-A-Cath continues to be in use without any issues. ? ?8. Follow-up: In 3 weeks for repeat follow-up. ? ?  ?30  minutes were  spent on this encounter.  The time was dedicated to reviewing laboratory data, disease status update, treatment choices and future plan of care discussion. ? ?Zola Button, MD ?4/24/202310:36 AM ? ?

## 2021-06-04 NOTE — Patient Instructions (Signed)
[facility] MEDICAL [facility]COLOG[facility]  Discharge Instructions: Thank you for choosing [facility] to provide your oncology and hematology care.   If you have a lab appointment with the Cancer Center, please go directly to the Cancer Center and check in at the registration area.   Wear comfortable clothing and clothing appropriate for easy access to any Portacath or PICC line.   We strive to give you quality time with your provider. You may need to reschedule your appointment if you arrive late (15 or more minutes).  Arriving late affects you and other patients whose appointments are after yours.  Also, if you miss three or more appointments without notifying the office, you may be dismissed from the clinic at the provider's discretion.      For prescription refill requests, have your pharmacy contact our office and allow 72 hours for refills to be completed.    Today you received the following chemotherapy and/or immunotherapy agent: Cabazitaxel (Jevtana)   To help prevent nausea and vomiting after your treatment, we encourage you to take your nausea medication as directed.  BELOW ARE SYMPTOMS THAT SHOULD BE REPORTED IMMEDIATELY: *FEVER GREATER THAN 100.4 F (38 C) OR HIGHER *CHILLS OR SWEATING *NAUSEA AND VOMITING THAT IS NOT CONTROLLED WITH YOUR NAUSEA MEDICATION *UNUSUAL SHORTNESS OF BREATH *UNUSUAL BRUISING OR BLEEDING *URINARY PROBLEMS (pain or burning when urinating, or frequent urination) *BOWEL PROBLEMS (unusual diarrhea, constipation, pain near the anus) TENDERNESS IN MOUTH AND THROAT WITH OR WITHOUT PRESENCE OF ULCERS (sore throat, sores in mouth, or a toothache) UNUSUAL RASH, SWELLING OR PAIN  UNUSUAL VAGINAL DISCHARGE OR ITCHING   Items with * indicate a potential emergency and should be followed up as soon as possible or go to the Emergency Department if any problems should occur.  Please show the CHEMOTHERAPY ALERT CARD or IMMUNOTHERAPY ALERT CARD at  check-in to the Emergency Department and triage nurse.  Should you have questions after your visit or need to cancel or reschedule your appointment, please contact [facility]  Dept: [phone]  and follow the prompts.  Office hours are 8:00 a.m. to 4:30 p.m. Monday - Friday. Please note that voicemails left after 4:00 p.m. may not be returned until the following business day.  We are closed weekends and major holidays. You have access to a nurse at all times for urgent questions. Please call the main number to the clinic Dept: [phone] and follow the prompts.   For any non-urgent questions, you may also contact your provider using MyChart. We now offer e-Visits for anyone 18 and older to request care online for non-urgent symptoms. For details visit [email or web address].   Also download the MyChart app! Go to the app store, search "MyChart", open the app, select [facility], and log in with your MyChart username and password.  Due to Covid, a mask is required upon entering the hospital/clinic. If you do not have a mask, one will be given to you upon arrival. For doctor visits, patients may have 1 support person aged 18 or older with them. For treatment visits, patients cannot have anyone with them due to current Covid guidelines and our immunocompromised population.  

## 2021-06-04 NOTE — Patient Outreach (Signed)
[name]) Care Management ? ?06/04/2021 ? ?[name] ?[birth year] ?[id number] ? ? ?Member admitted to hospital 4/14-4/21 for sepsis secondary to UTI.  Call placed to significant other, state they are currently at the cancer center in the process of starting chemo treatment, she will call back.  Will follow up within the next 3-4 business days if no call back.  ? ?[name], RN, MSN, CCM ?[facility] Care Management  ?Community Care Manager ?[phone] ? ?

## 2021-06-05 LAB — PROSTATE-SPECIFIC AG, SERUM (LABCORP): Prostate Specific Ag, Serum: 499 ng/mL — ABNORMAL HIGH (ref 0.0–4.0)

## 2021-06-06 ENCOUNTER — Inpatient Hospital Stay: Payer: Medicare Other

## 2021-06-06 ENCOUNTER — Other Ambulatory Visit: Payer: Self-pay

## 2021-06-06 VITALS — BP 145/71 | HR 82 | Temp 98.2°F | Resp 18

## 2021-06-06 DIAGNOSIS — C61 Malignant neoplasm of prostate: Secondary | ICD-10-CM | POA: Diagnosis not present

## 2021-06-06 DIAGNOSIS — K219 Gastro-esophageal reflux disease without esophagitis: Secondary | ICD-10-CM | POA: Diagnosis not present

## 2021-06-06 DIAGNOSIS — N390 Urinary tract infection, site not specified: Secondary | ICD-10-CM | POA: Diagnosis not present

## 2021-06-06 DIAGNOSIS — E1165 Type 2 diabetes mellitus with hyperglycemia: Secondary | ICD-10-CM | POA: Diagnosis not present

## 2021-06-06 DIAGNOSIS — B3781 Candidal esophagitis: Secondary | ICD-10-CM | POA: Diagnosis not present

## 2021-06-06 DIAGNOSIS — E46 Unspecified protein-calorie malnutrition: Secondary | ICD-10-CM | POA: Diagnosis not present

## 2021-06-06 DIAGNOSIS — D649 Anemia, unspecified: Secondary | ICD-10-CM | POA: Diagnosis not present

## 2021-06-06 DIAGNOSIS — Z7982 Long term (current) use of aspirin: Secondary | ICD-10-CM | POA: Diagnosis not present

## 2021-06-06 DIAGNOSIS — I10 Essential (primary) hypertension: Secondary | ICD-10-CM | POA: Diagnosis not present

## 2021-06-06 DIAGNOSIS — Z8616 Personal history of COVID-19: Secondary | ICD-10-CM | POA: Diagnosis not present

## 2021-06-06 DIAGNOSIS — B370 Candidal stomatitis: Secondary | ICD-10-CM | POA: Diagnosis not present

## 2021-06-06 DIAGNOSIS — D709 Neutropenia, unspecified: Secondary | ICD-10-CM | POA: Diagnosis not present

## 2021-06-06 DIAGNOSIS — C7931 Secondary malignant neoplasm of brain: Secondary | ICD-10-CM | POA: Diagnosis not present

## 2021-06-06 DIAGNOSIS — Z79891 Long term (current) use of opiate analgesic: Secondary | ICD-10-CM | POA: Diagnosis not present

## 2021-06-06 DIAGNOSIS — Z792 Long term (current) use of antibiotics: Secondary | ICD-10-CM | POA: Diagnosis not present

## 2021-06-06 DIAGNOSIS — E785 Hyperlipidemia, unspecified: Secondary | ICD-10-CM | POA: Diagnosis not present

## 2021-06-06 DIAGNOSIS — C7951 Secondary malignant neoplasm of bone: Secondary | ICD-10-CM | POA: Diagnosis not present

## 2021-06-06 DIAGNOSIS — D509 Iron deficiency anemia, unspecified: Secondary | ICD-10-CM | POA: Diagnosis not present

## 2021-06-06 DIAGNOSIS — Z794 Long term (current) use of insulin: Secondary | ICD-10-CM | POA: Diagnosis not present

## 2021-06-06 DIAGNOSIS — G9341 Metabolic encephalopathy: Secondary | ICD-10-CM | POA: Diagnosis not present

## 2021-06-06 DIAGNOSIS — Z5111 Encounter for antineoplastic chemotherapy: Secondary | ICD-10-CM | POA: Diagnosis not present

## 2021-06-06 DIAGNOSIS — J449 Chronic obstructive pulmonary disease, unspecified: Secondary | ICD-10-CM | POA: Diagnosis not present

## 2021-06-06 DIAGNOSIS — Z87891 Personal history of nicotine dependence: Secondary | ICD-10-CM | POA: Diagnosis not present

## 2021-06-06 DIAGNOSIS — Z5189 Encounter for other specified aftercare: Secondary | ICD-10-CM | POA: Diagnosis not present

## 2021-06-06 DIAGNOSIS — G8929 Other chronic pain: Secondary | ICD-10-CM | POA: Diagnosis not present

## 2021-06-06 MED ORDER — PEGFILGRASTIM-CBQV 6 MG/0.6ML ~~LOC~~ SOSY
6.0000 mg | PREFILLED_SYRINGE | Freq: Once | SUBCUTANEOUS | Status: AC
  Administered 2021-06-06: 6 mg via SUBCUTANEOUS
  Filled 2021-06-06: qty 0.6

## 2021-06-06 NOTE — Patient Instructions (Signed)

## 2021-06-07 ENCOUNTER — Other Ambulatory Visit: Payer: Medicare Other | Admitting: Hospice

## 2021-06-08 ENCOUNTER — Telehealth: Payer: Self-pay | Admitting: *Deleted

## 2021-06-08 ENCOUNTER — Telehealth: Payer: Self-pay | Admitting: Oncology

## 2021-06-08 ENCOUNTER — Other Ambulatory Visit: Payer: Self-pay | Admitting: *Deleted

## 2021-06-08 DIAGNOSIS — E1165 Type 2 diabetes mellitus with hyperglycemia: Secondary | ICD-10-CM | POA: Diagnosis not present

## 2021-06-08 DIAGNOSIS — I10 Essential (primary) hypertension: Secondary | ICD-10-CM | POA: Diagnosis not present

## 2021-06-08 DIAGNOSIS — N390 Urinary tract infection, site not specified: Secondary | ICD-10-CM | POA: Diagnosis not present

## 2021-06-08 DIAGNOSIS — G9341 Metabolic encephalopathy: Secondary | ICD-10-CM | POA: Diagnosis not present

## 2021-06-08 DIAGNOSIS — C7951 Secondary malignant neoplasm of bone: Secondary | ICD-10-CM | POA: Diagnosis not present

## 2021-06-08 DIAGNOSIS — C61 Malignant neoplasm of prostate: Secondary | ICD-10-CM | POA: Diagnosis not present

## 2021-06-08 NOTE — Patient Outreach (Signed)
?[facility]) Care Management ?Telephonic RN Care Manager Note ? ? ?06/08/2021 ?Name:  Stephen Lynch MRN:  [id number] DOB:  04-16-1945 ? ?Summary: ?Outreach attempt #2, successful to fiance.  Denies any urgent concerns, encouraged to contact this care manager with questions.   ? ?Subjective: ?Stephen Lynch is an 76 y.o. year old male who is a primary patient of [facility]. The care management team was consulted for assistance with care management and/or care coordination needs.   ? ?Telephonic RN Care Manager completed Telephone Visit today. ? ?Objective:  ? ?Medications Reviewed Today   ? ? Reviewed by Lester Renova, RN (Registered Nurse) on 06/04/21 at [place] List Status: <None>  ? ?Medication Order Taking? Sig Documenting Provider Last Dose Status Informant  ?acetaminophen (TYLENOL) 500 MG tablet [id number] No Take 1,000 mg by mouth daily as needed for moderate pain. [provider] 05/25/2021 Active Spouse/Significant Other  ?ASPIRIN 81 PO [id number] No Take 81 mg by mouth daily.  [provider] 05/25/2021 Active Spouse/Significant Other  ?Calcium-Magnesium-Zinc 333-133-5 MG TABS [id number] No Take 1 tablet by mouth at bedtime. [provider] 05/24/2021 Active Spouse/Significant Other  ?Continuous Blood Gluc Sensor (FREESTYLE LIBRE 2 SENSOR) MISC [id number] No APPLY EVERY 14 DAYS, CHECK GLUCOSE BEFORE EACH MEAL, 2 HRS AFTER, IN THE MORNING AND BEFORE BEDTIME [name], MD Taking Active Spouse/Significant Other  ?famotidine (PEPCID) 20 MG tablet [id number] No Take 20 mg by mouth daily. [provider] 05/25/2021 Active Spouse/Significant Other  ?ferrous sulfate 325 (65 FE) MG tablet [id number]  Take 1 tablet (325 mg total) by mouth daily. Regalado, Belkys A, MD  Active   ?fluconazole (DIFLUCAN) 200 MG tablet [id number]  Take 1 tablet (200 mg total) by mouth daily for 10 days. Regalado, [name], MD  Active   ?HUMALOG KWIKPEN 100 UNIT/ML KwikPen  [id number]  Inject 3-11 Units into the skin in the morning, at noon, and at bedtime. CBG 70 - 120: 0 units  ?CBG 121 - 150: 3 units  ?CBG 151 - 200: 4 units  ?CBG 201 - 250: 7 units  ?CBG 251 - 300: 11 units  ? ?CBG more than 350 call PCP Regalado, Belkys A, MD  Active   ?lidocaine (LIDODERM) 5 % [id number] No Place 2 patches onto the skin daily. Remove & Discard patch within 12 hours or as directed by MD Flora Lipps, MD Past Week prn Active Spouse/Significant Other  ?lidocaine-prilocaine (EMLA) cream [id number] No Apply 1 application topically as needed. [name], MD Past Week Active Spouse/Significant Other  ?loperamide (IMODIUM) 1 MG/5ML solution [id number] No Take 2 mg by mouth daily as needed for diarrhea or loose stools. [provider] Past Week Active Spouse/Significant Other  ?magnesium 30 MG tablet [id number] No Take 30 mg by mouth daily. [provider] 05/25/2021 Active Spouse/Significant Other  ?megestrol (MEGACE) 400 MG/10ML suspension [id number]  Take 10 mLs (400 mg total) by mouth 2 (two) times daily. Regalado, [name], MD  Active   ?Menthol-Camphor (TIGER BALM ARTHRITIS RUB EX) [id number] No Apply 1 application topically 2 (two) times daily as needed (back pain). [provider] Past Week Active Spouse/Significant Other  ?Nutritional Supplements (,FEEDING SUPPLEMENT, PROSOURCE PLUS) liquid [id number]  Take 30 mLs by mouth 2 (two) times daily between meals. Regalado, Belkys A, MD  Active   ?nystatin (MYCOSTATIN) 100000 UNIT/ML suspension [id number]  Take 5 mLs (500,000 Units total) by mouth 4 (four) times daily. Regalado, [name], MD  Active   ?  ondansetron (ZOFRAN) 4 MG tablet [id number] No Take 1 tablet (4 mg total) by mouth every 6 (six) hours as needed. for nausea [name], MD 05/25/2021 Active Spouse/Significant Other  ?Oxycodone HCl 10 MG TABS [id number] No Take 1 tablet (10 mg total) by mouth every 4 (four) hours as needed.  ?Patient taking differently: Take 10  mg by mouth every 4 (four) hours as needed (for severe pain).  ? [name], MD 05/25/2021 Active Spouse/Significant Other  ?prochlorperazine (COMPAZINE) 10 MG tablet [id number] No Take 1 tablet (10 mg total) by mouth every 6 (six) hours as needed. [name], MD Past Week Active Spouse/Significant Other  ?rosuvastatin (CRESTOR) 10 MG tablet [id number] No Take 1 tablet (10 mg total) by mouth daily.  ?Patient taking differently: Take 10 mg by mouth every evening.  ? [name], MD 05/24/2021 Active Spouse/Significant Other  ?senna-docusate (SENOKOT-S) 8.6-50 MG tablet [id number]  Take 1 tablet by mouth daily. Regalado, [name], MD  Active   ?TRESIBA FLEXTOUCH 100 UNIT/ML FlexTouch Pen [id number]  Inject 20 Units into the skin at bedtime. Regalado, [name], MD  Active   ? ?  ?  ? ?  ? ? ? ?SDOH:  (Social Determinants of Health) assessments and interventions performed:  ? ? ? ?Care Plan ? ?Review of patient past medical history, allergies, medications, health status, including review of consultants reports, laboratory and other test data, was performed as part of comprehensive evaluation for care management services.  ? ?Care Plan : RNCM Plan of Care  ?Updates made by [name], RN since 06/08/2021 12:00 AM  ?  ? ?Problem: Knoweldge deficit regarding management of health conditions, prostate cancer and DM   ?Priority: High  ?  ? ?Long-Range Goal: Member will show adequate management of chronic conditions, Prostate Cancer and DM (goal extended due to not being on track)   ?Start Date: 10/27/2020  ?Expected End Date: 10/27/2021  ?This Visit's Progress: Not on track  ?Recent Progress: Not on track  ?Priority: High  ?Note:   ?Current Barriers:  ?Knowledge Deficits related to plan of care for management of DMII and Prostate cancer  ?Chronic Disease Management support and education needs related to DMII and Prostate cancer ?Transportation barriers ?Non-adherence to prescribed medication regimen ? ?RNCM Clinical  Goal(s):  ?Patient will verbalize understanding of plan for management of DMII and Prostate cancer as evidenced by no hospital admissions ?take all medications exactly as prescribed and will call provider for medication related questions as evidenced by reported adherence by member and wife    ?demonstrate understanding of rationale for each prescribed medication as evidenced by Insulin [name])    ?attend all scheduled medical appointments: PCP, oncology, and endocrinology as evidenced by record of attendance at provider offices        ?continue to work with Consulting civil engineer and/or Social Worker to address care management and care coordination needs related to DMII and prostate cancer as evidenced by adherence to CM Team Scheduled appointments     ?demonstrate ongoing self health care management ability to manage DM, including diet, medication management, and activity as evidenced by     through collaboration with RN Care manager, provider, and care team.  ? ?Interventions: ?Inter-disciplinary care team collaboration (see longitudinal plan of care) ?Evaluation of current treatment plan related to  self management and patient's adherence to plan as established by provider ? ? ?Diabetes:  (Status: Goal on track: NO.) ?Lab Results  ?Component Value Date  ? HGBA1C  8.4 (H) 05/14/2021  ?Assessed patient's understanding of A1c goal: <7% ?Provided education to patient about basic DM disease process; ?Reviewed medications with patient and discussed importance of medication adherence;        ?Reviewed prescribed diet with patient low sugars, low carbs; ?Reviewed scheduled/upcoming provider appointments including: Endocrinology in 08/25/21;         ?Advised patient, providing education and rationale, to check cbg 2-3 times a day and record        ?Assessed social determinant of health barriers;        ? ?Oncology:  (Status: Goal on track:  NO.) ?Assessment of understanding of oncology diagnosis:  ?Assessed patient  understanding of cancer diagnosis and recommended treatment plan, Reviewed upcoming provider appointments and treatment appointments, and Assessed support system. Has consistent/reliable family or other support:

## 2021-06-08 NOTE — Telephone Encounter (Signed)
Stephen Lynch reports that Stephen Lynch is still confused and feet are still swollen. Received on treatment on Monday. Wants to know if Dr Alen Blew needs him to do anything different. ?

## 2021-06-08 NOTE — Telephone Encounter (Signed)
Notified Stephen Lynch that Dr Alen Blew says there is nothing different to do now. Just watch him over the next few days. She will call us on Monday with an update. ?

## 2021-06-08 NOTE — Telephone Encounter (Signed)
Scheduled per 04/24 los, patient has been called and notified.  ?

## 2021-06-13 DIAGNOSIS — E1165 Type 2 diabetes mellitus with hyperglycemia: Secondary | ICD-10-CM | POA: Diagnosis not present

## 2021-06-13 DIAGNOSIS — C61 Malignant neoplasm of prostate: Secondary | ICD-10-CM | POA: Diagnosis not present

## 2021-06-13 DIAGNOSIS — G9341 Metabolic encephalopathy: Secondary | ICD-10-CM | POA: Diagnosis not present

## 2021-06-13 DIAGNOSIS — I10 Essential (primary) hypertension: Secondary | ICD-10-CM | POA: Diagnosis not present

## 2021-06-13 DIAGNOSIS — N390 Urinary tract infection, site not specified: Secondary | ICD-10-CM | POA: Diagnosis not present

## 2021-06-13 DIAGNOSIS — D509 Iron deficiency anemia, unspecified: Secondary | ICD-10-CM | POA: Diagnosis not present

## 2021-06-13 DIAGNOSIS — C7931 Secondary malignant neoplasm of brain: Secondary | ICD-10-CM | POA: Diagnosis not present

## 2021-06-13 DIAGNOSIS — E7800 Pure hypercholesterolemia, unspecified: Secondary | ICD-10-CM | POA: Diagnosis not present

## 2021-06-13 DIAGNOSIS — C7951 Secondary malignant neoplasm of bone: Secondary | ICD-10-CM | POA: Diagnosis not present

## 2021-06-15 DIAGNOSIS — I10 Essential (primary) hypertension: Secondary | ICD-10-CM | POA: Diagnosis not present

## 2021-06-15 DIAGNOSIS — G9341 Metabolic encephalopathy: Secondary | ICD-10-CM | POA: Diagnosis not present

## 2021-06-15 DIAGNOSIS — N390 Urinary tract infection, site not specified: Secondary | ICD-10-CM | POA: Diagnosis not present

## 2021-06-15 DIAGNOSIS — E1165 Type 2 diabetes mellitus with hyperglycemia: Secondary | ICD-10-CM | POA: Diagnosis not present

## 2021-06-15 DIAGNOSIS — C7951 Secondary malignant neoplasm of bone: Secondary | ICD-10-CM | POA: Diagnosis not present

## 2021-06-15 DIAGNOSIS — C61 Malignant neoplasm of prostate: Secondary | ICD-10-CM | POA: Diagnosis not present

## 2021-06-16 DIAGNOSIS — I10 Essential (primary) hypertension: Secondary | ICD-10-CM | POA: Diagnosis not present

## 2021-06-16 DIAGNOSIS — C7951 Secondary malignant neoplasm of bone: Secondary | ICD-10-CM | POA: Diagnosis not present

## 2021-06-16 DIAGNOSIS — N390 Urinary tract infection, site not specified: Secondary | ICD-10-CM | POA: Diagnosis not present

## 2021-06-16 DIAGNOSIS — G9341 Metabolic encephalopathy: Secondary | ICD-10-CM | POA: Diagnosis not present

## 2021-06-16 DIAGNOSIS — E1165 Type 2 diabetes mellitus with hyperglycemia: Secondary | ICD-10-CM | POA: Diagnosis not present

## 2021-06-16 DIAGNOSIS — C61 Malignant neoplasm of prostate: Secondary | ICD-10-CM | POA: Diagnosis not present

## 2021-06-19 ENCOUNTER — Other Ambulatory Visit: Payer: Self-pay | Admitting: *Deleted

## 2021-06-19 DIAGNOSIS — G9341 Metabolic encephalopathy: Secondary | ICD-10-CM | POA: Diagnosis not present

## 2021-06-19 DIAGNOSIS — N390 Urinary tract infection, site not specified: Secondary | ICD-10-CM | POA: Diagnosis not present

## 2021-06-19 DIAGNOSIS — I10 Essential (primary) hypertension: Secondary | ICD-10-CM | POA: Diagnosis not present

## 2021-06-19 DIAGNOSIS — C61 Malignant neoplasm of prostate: Secondary | ICD-10-CM | POA: Diagnosis not present

## 2021-06-19 DIAGNOSIS — C7951 Secondary malignant neoplasm of bone: Secondary | ICD-10-CM | POA: Diagnosis not present

## 2021-06-19 DIAGNOSIS — E1165 Type 2 diabetes mellitus with hyperglycemia: Secondary | ICD-10-CM | POA: Diagnosis not present

## 2021-06-19 NOTE — Patient Outreach (Signed)
?[facility]) Care Management ?Telephonic RN Care Manager Note ? ? ?06/19/2021 ?Name:  Stephen Lynch MRN:  [id number] DOB:  1945-12-10 ? ?Summary: ?Outgoing call placed to member's significant other, successful.  Denies any urgent concerns, encouraged to contact this care manager with questions.   ? ?Subjective: ?Stephen Lynch is an 76 y.o. year old male who is a primary patient of [facility]. The care management team was consulted for assistance with care management and/or care coordination needs.   ? ?Telephonic RN Care Manager completed Telephone Visit today. ? ?Objective:  ? ?Medications Reviewed Today   ? ? Reviewed by Lester Ferrelview, RN (Registered Nurse) on 06/04/21 at [facility] Status: <None>  ? ?Medication Order Taking? Sig Documenting Provider Last Dose Status Informant  ?acetaminophen (TYLENOL) 500 MG tablet [id number] No Take 1,000 mg by mouth daily as needed for moderate pain. [provider] 05/25/2021 Active Spouse/Significant Other  ?ASPIRIN 81 PO [id number] No Take 81 mg by mouth daily.  [provider] 05/25/2021 Active Spouse/Significant Other  ?Calcium-Magnesium-Zinc 333-133-5 MG TABS [id number] No Take 1 tablet by mouth at bedtime. [provider] 05/24/2021 Active Spouse/Significant Other  ?Continuous Blood Gluc Sensor (FREESTYLE LIBRE 2 SENSOR) MISC [id number] No APPLY EVERY 14 DAYS, CHECK GLUCOSE BEFORE EACH MEAL, 2 HRS AFTER, IN THE MORNING AND BEFORE BEDTIME [name], MD Taking Active Spouse/Significant Other  ?famotidine (PEPCID) 20 MG tablet [id number] No Take 20 mg by mouth daily. [provider] 05/25/2021 Active Spouse/Significant Other  ?ferrous sulfate 325 (65 FE) MG tablet [id number]  Take 1 tablet (325 mg total) by mouth daily. Regalado, Belkys A, MD  Active   ?fluconazole (DIFLUCAN) 200 MG tablet [id number]  Take 1 tablet (200 mg total) by mouth daily for 10 days. Regalado, [name], MD  Active   ?HUMALOG KWIKPEN  100 UNIT/ML KwikPen [id number]  Inject 3-11 Units into the skin in the morning, at noon, and at bedtime. CBG 70 - 120: 0 units  ?CBG 121 - 150: 3 units  ?CBG 151 - 200: 4 units  ?CBG 201 - 250: 7 units  ?CBG 251 - 300: 11 units  ? ?CBG more than 350 call PCP Regalado, Belkys A, MD  Active   ?lidocaine (LIDODERM) 5 % [id number] No Place 2 patches onto the skin daily. Remove & Discard patch within 12 hours or as directed by MD Flora Lipps, MD Past Week prn Active Spouse/Significant Other  ?lidocaine-prilocaine (EMLA) cream [id number] No Apply 1 application topically as needed. [name], MD Past Week Active Spouse/Significant Other  ?loperamide (IMODIUM) 1 MG/5ML solution [id number] No Take 2 mg by mouth daily as needed for diarrhea or loose stools. [provider] Past Week Active Spouse/Significant Other  ?magnesium 30 MG tablet [id number] No Take 30 mg by mouth daily. [provider] 05/25/2021 Active Spouse/Significant Other  ?megestrol (MEGACE) 400 MG/10ML suspension [id number]  Take 10 mLs (400 mg total) by mouth 2 (two) times daily. Regalado, [name], MD  Active   ?Menthol-Camphor (TIGER BALM ARTHRITIS RUB EX) [id number] No Apply 1 application topically 2 (two) times daily as needed (back pain). [provider] Past Week Active Spouse/Significant Other  ?Nutritional Supplements (,FEEDING SUPPLEMENT, PROSOURCE PLUS) liquid [id number]  Take 30 mLs by mouth 2 (two) times daily between meals. Regalado, Belkys A, MD  Active   ?nystatin (MYCOSTATIN) 100000 UNIT/ML suspension [id number]  Take 5 mLs (500,000 Units total) by mouth 4 (four) times daily. Regalado, [name], MD  Active   ?ondansetron (ZOFRAN) 4 MG tablet [id number] No Take 1 tablet (4 mg total) by mouth every 6 (six) hours as needed. for nausea [name], MD 05/25/2021 Active Spouse/Significant Other  ?Oxycodone HCl 10 MG TABS [id number] No Take 1 tablet (10 mg total) by mouth every 4 (four) hours as needed.  ?Patient taking  differently: Take 10 mg by mouth every 4 (four) hours as needed (for severe pain).  ? [name], MD 05/25/2021 Active Spouse/Significant Other  ?prochlorperazine (COMPAZINE) 10 MG tablet [id number] No Take 1 tablet (10 mg total) by mouth every 6 (six) hours as needed. [name], MD Past Week Active Spouse/Significant Other  ?rosuvastatin (CRESTOR) 10 MG tablet [id number] No Take 1 tablet (10 mg total) by mouth daily.  ?Patient taking differently: Take 10 mg by mouth every evening.  ? [name], MD 05/24/2021 Active Spouse/Significant Other  ?senna-docusate (SENOKOT-S) 8.6-50 MG tablet [id number]  Take 1 tablet by mouth daily. Regalado, [name], MD  Active   ?TRESIBA FLEXTOUCH 100 UNIT/ML FlexTouch Pen [id number]  Inject 20 Units into the skin at bedtime. Regalado, [name], MD  Active   ? ?  ?  ? ?  ? ? ? ?SDOH:  (Social Determinants of Health) assessments and interventions performed:  ? ? ? ?Care Plan ? ?Review of patient past medical history, allergies, medications, health status, including review of consultants reports, laboratory and other test data, was performed as part of comprehensive evaluation for care management services.  ? ?Care Plan : RNCM Plan of Care  ?Updates made by [name], RN since 06/19/2021 12:00 AM  ?  ? ?Problem: Knoweldge deficit regarding management of health conditions, prostate cancer and DM   ?Priority: High  ?  ? ?Long-Range Goal: Member will show adequate management of chronic conditions, Prostate Cancer and DM (goal extended due to not being on track)   ?Start Date: 10/27/2020  ?Expected End Date: 10/27/2021  ?This Visit's Progress: On track  ?Recent Progress: Not on track  ?Priority: High  ?Note:   ?Current Barriers:  ?Knowledge Deficits related to plan of care for management of DMII and Prostate cancer  ?Chronic Disease Management support and education needs related to DMII and Prostate cancer ?Transportation barriers ?Non-adherence to prescribed medication  regimen ? ?RNCM Clinical Goal(s):  ?Patient will verbalize understanding of plan for management of DMII and Prostate cancer as evidenced by no hospital admissions ?take all medications exactly as prescribed and will call provider for medication related questions as evidenced by reported adherence by member and wife    ?demonstrate understanding of rationale for each prescribed medication as evidenced by Insulin [name])    ?attend all scheduled medical appointments: PCP, oncology, and endocrinology as evidenced by record of attendance at provider offices        ?continue to work with Consulting civil engineer and/or Social Worker to address care management and care coordination needs related to DMII and prostate cancer as evidenced by adherence to CM Team Scheduled appointments     ?demonstrate ongoing self health care management ability to manage DM, including diet, medication management, and activity as evidenced by     through collaboration with RN Care manager, provider, and care team.  ? ?Interventions: ?Inter-disciplinary care team collaboration (see longitudinal plan of care) ?Evaluation of current treatment plan related to  self management and patient's adherence to plan as established by provider ? ? ?Diabetes:  (Status: Goal on Track (progressing): YES.) ?Lab Results  ?Component Value Date  ?  HGBA1C 8.4 (H) 05/14/2021  ?Assessed patient's understanding of A1c goal: <7% ?Provided education to patient about basic DM disease process; ?Reviewed medications with patient and discussed importance of medication adherence;        ?Reviewed prescribed diet with patient low sugars, low carbs; ?Reviewed scheduled/upcoming provider appointments including: Endocrinology in 2021-08-16;         ?Advised patient, providing education and rationale, to check cbg 2-3 times a day and record        ?Assessed social determinant of health barriers;        ? ?Oncology:  (Status: Goal on track:  NO.) ?Assessment of understanding of oncology  diagnosis:  ?Assessed patient understanding of cancer diagnosis and recommended treatment plan, Reviewed upcoming provider appointments and treatment appointments, and Assessed support system. Has consistent/rel

## 2021-06-20 ENCOUNTER — Telehealth: Payer: Self-pay

## 2021-06-20 DIAGNOSIS — I10 Essential (primary) hypertension: Secondary | ICD-10-CM | POA: Diagnosis not present

## 2021-06-20 DIAGNOSIS — N390 Urinary tract infection, site not specified: Secondary | ICD-10-CM | POA: Diagnosis not present

## 2021-06-20 DIAGNOSIS — E1165 Type 2 diabetes mellitus with hyperglycemia: Secondary | ICD-10-CM | POA: Diagnosis not present

## 2021-06-20 DIAGNOSIS — G9341 Metabolic encephalopathy: Secondary | ICD-10-CM | POA: Diagnosis not present

## 2021-06-20 DIAGNOSIS — C7951 Secondary malignant neoplasm of bone: Secondary | ICD-10-CM | POA: Diagnosis not present

## 2021-06-20 DIAGNOSIS — C61 Malignant neoplasm of prostate: Secondary | ICD-10-CM | POA: Diagnosis not present

## 2021-06-20 NOTE — Telephone Encounter (Signed)
Stephen Lynch left voicemail stating patient had a high of 489 yesterday. I called patient this morning and Stephen Lynch states they were able to get it down and that was the first high blood sugar in a while. She did state that the patient has been experiencing a lot of lows. This morning patient blood sugar was 103 and she gave patient 14 units of insulin. Per the last note from hospital patient should be taking humalog as folllow CBG 70 - 120: 0 units  ?CBG 121 - 150: 3 units  ?CBG 151 - 200: 4 units  ?CBG 201 - 250: 7 units  ?CBG 251 - 300: 11 units  ?CBG more than 350 call PCP ? ?Per Stephen Lynch patient states that he doesn't want to take this way and wants to take insulin how you told patient to do, but that way is causing patient to have a lot of lows. Please advise ?

## 2021-06-21 DIAGNOSIS — I10 Essential (primary) hypertension: Secondary | ICD-10-CM | POA: Diagnosis not present

## 2021-06-21 DIAGNOSIS — R54 Age-related physical debility: Secondary | ICD-10-CM | POA: Diagnosis not present

## 2021-06-21 DIAGNOSIS — N390 Urinary tract infection, site not specified: Secondary | ICD-10-CM | POA: Diagnosis not present

## 2021-06-21 DIAGNOSIS — R270 Ataxia, unspecified: Secondary | ICD-10-CM | POA: Diagnosis not present

## 2021-06-26 ENCOUNTER — Inpatient Hospital Stay: Payer: Medicare Other

## 2021-06-26 ENCOUNTER — Other Ambulatory Visit: Payer: Self-pay

## 2021-06-26 ENCOUNTER — Inpatient Hospital Stay: Payer: Medicare Other | Admitting: Dietician

## 2021-06-26 ENCOUNTER — Inpatient Hospital Stay (HOSPITAL_BASED_OUTPATIENT_CLINIC_OR_DEPARTMENT_OTHER): Payer: Medicare Other | Admitting: Oncology

## 2021-06-26 ENCOUNTER — Inpatient Hospital Stay: Payer: Medicare Other | Attending: Oncology

## 2021-06-26 VITALS — BP 141/75 | HR 99 | Temp 98.3°F | Resp 17 | Ht 73.0 in | Wt 193.8 lb

## 2021-06-26 VITALS — BP 134/76 | HR 100 | Temp 97.9°F | Resp 16

## 2021-06-26 DIAGNOSIS — Z923 Personal history of irradiation: Secondary | ICD-10-CM | POA: Diagnosis not present

## 2021-06-26 DIAGNOSIS — Z5189 Encounter for other specified aftercare: Secondary | ICD-10-CM | POA: Insufficient documentation

## 2021-06-26 DIAGNOSIS — C61 Malignant neoplasm of prostate: Secondary | ICD-10-CM

## 2021-06-26 DIAGNOSIS — Z9221 Personal history of antineoplastic chemotherapy: Secondary | ICD-10-CM | POA: Diagnosis not present

## 2021-06-26 DIAGNOSIS — T451X5A Adverse effect of antineoplastic and immunosuppressive drugs, initial encounter: Secondary | ICD-10-CM | POA: Diagnosis not present

## 2021-06-26 DIAGNOSIS — D709 Neutropenia, unspecified: Secondary | ICD-10-CM | POA: Diagnosis not present

## 2021-06-26 DIAGNOSIS — Z79899 Other long term (current) drug therapy: Secondary | ICD-10-CM | POA: Insufficient documentation

## 2021-06-26 DIAGNOSIS — D630 Anemia in neoplastic disease: Secondary | ICD-10-CM | POA: Diagnosis not present

## 2021-06-26 DIAGNOSIS — Z5111 Encounter for antineoplastic chemotherapy: Secondary | ICD-10-CM | POA: Insufficient documentation

## 2021-06-26 DIAGNOSIS — Z192 Hormone resistant malignancy status: Secondary | ICD-10-CM | POA: Diagnosis not present

## 2021-06-26 DIAGNOSIS — R634 Abnormal weight loss: Secondary | ICD-10-CM | POA: Diagnosis not present

## 2021-06-26 DIAGNOSIS — D649 Anemia, unspecified: Secondary | ICD-10-CM

## 2021-06-26 DIAGNOSIS — D6481 Anemia due to antineoplastic chemotherapy: Secondary | ICD-10-CM | POA: Diagnosis not present

## 2021-06-26 LAB — CMP (CANCER CENTER ONLY)
ALT: 30 U/L (ref 0–44)
AST: 52 U/L — ABNORMAL HIGH (ref 15–41)
Albumin: 3 g/dL — ABNORMAL LOW (ref 3.5–5.0)
Alkaline Phosphatase: 461 U/L — ABNORMAL HIGH (ref 38–126)
Anion gap: 10 (ref 5–15)
BUN: 12 mg/dL (ref 8–23)
CO2: 22 mmol/L (ref 22–32)
Calcium: 8.8 mg/dL — ABNORMAL LOW (ref 8.9–10.3)
Chloride: 106 mmol/L (ref 98–111)
Creatinine: 0.74 mg/dL (ref 0.61–1.24)
GFR, Estimated: >60 mL/min (ref >60)
Glucose, Bld: 82 mg/dL (ref 70–99)
Potassium: 3.4 mmol/L — ABNORMAL LOW (ref 3.5–5.1)
Sodium: 138 mmol/L (ref 135–145)
Total Bilirubin: 1.2 mg/dL (ref 0.3–1.2)
Total Protein: 6.5 g/dL (ref 6.5–8.1)

## 2021-06-26 LAB — CBC WITH DIFFERENTIAL (CANCER CENTER ONLY)
Abs Immature Granulocytes: 0.22 10*3/uL — ABNORMAL HIGH (ref 0.00–0.07)
Basophils Absolute: 0.1 10*3/uL (ref 0.0–0.1)
Basophils Relative: 0 %
Eosinophils Absolute: 0 10*3/uL (ref 0.0–0.5)
Eosinophils Relative: 0 %
HCT: 32.8 % — ABNORMAL LOW (ref 39.0–52.0)
Hemoglobin: 10.7 g/dL — ABNORMAL LOW (ref 13.0–17.0)
Immature Granulocytes: 1 %
Lymphocytes Relative: 5 %
Lymphs Abs: 0.7 10*3/uL (ref 0.7–4.0)
MCH: 30.4 pg (ref 26.0–34.0)
MCHC: 32.6 g/dL (ref 30.0–36.0)
MCV: 93.2 fL (ref 80.0–100.0)
Monocytes Absolute: 1.4 10*3/uL — ABNORMAL HIGH (ref 0.1–1.0)
Monocytes Relative: 9 %
Neutro Abs: 12.8 10*3/uL — ABNORMAL HIGH (ref 1.7–7.7)
Neutrophils Relative %: 85 %
Platelet Count: 161 10*3/uL (ref 150–400)
RBC: 3.52 MIL/uL — ABNORMAL LOW (ref 4.22–5.81)
RDW: 24.6 % — ABNORMAL HIGH (ref 11.5–15.5)
WBC Count: 15.2 10*3/uL — ABNORMAL HIGH (ref 4.0–10.5)
nRBC: 0.1 % (ref 0.0–0.2)

## 2021-06-26 LAB — SAMPLE TO BLOOD BANK

## 2021-06-26 MED ORDER — SODIUM CHLORIDE 0.9 % IV SOLN
10.0000 mg | Freq: Once | INTRAVENOUS | Status: AC
  Administered 2021-06-26: 10 mg via INTRAVENOUS
  Filled 2021-06-26: qty 10

## 2021-06-26 MED ORDER — SODIUM CHLORIDE 0.9% FLUSH
10.0000 mL | INTRAVENOUS | Status: DC | PRN
  Administered 2021-06-26: 10 mL

## 2021-06-26 MED ORDER — SODIUM CHLORIDE 0.9 % IV SOLN
20.0000 mg/m2 | Freq: Once | INTRAVENOUS | Status: AC
  Administered 2021-06-26: 44 mg via INTRAVENOUS
  Filled 2021-06-26: qty 4.4

## 2021-06-26 MED ORDER — DIPHENHYDRAMINE HCL 50 MG/ML IJ SOLN
25.0000 mg | Freq: Once | INTRAMUSCULAR | Status: AC
  Administered 2021-06-26: 25 mg via INTRAVENOUS
  Filled 2021-06-26: qty 1

## 2021-06-26 MED ORDER — FAMOTIDINE IN NACL 20-0.9 MG/50ML-% IV SOLN
20.0000 mg | Freq: Once | INTRAVENOUS | Status: AC
  Administered 2021-06-26: 20 mg via INTRAVENOUS
  Filled 2021-06-26: qty 50

## 2021-06-26 MED ORDER — HEPARIN SOD (PORK) LOCK FLUSH 100 UNIT/ML IV SOLN
500.0000 [IU] | Freq: Once | INTRAVENOUS | Status: AC | PRN
  Administered 2021-06-26: 500 [IU]

## 2021-06-26 MED ORDER — SODIUM CHLORIDE 0.9 % IV SOLN: Freq: Once | INTRAVENOUS | Status: AC

## 2021-06-26 NOTE — Patient Instructions (Signed)
[facility]  Discharge Instructions: Thank you for choosing [facility] to provide your oncology and hematology care.   If you have a lab appointment with the Cancer Center, please go directly to the Cancer Center and check in at the registration area.   Wear comfortable clothing and clothing appropriate for easy access to any Portacath or PICC line.   We strive to give you quality time with your provider. You may need to reschedule your appointment if you arrive late (15 or more minutes).  Arriving late affects you and other patients whose appointments are after yours.  Also, if you miss three or more appointments without notifying the office, you may be dismissed from the clinic at the provider's discretion.      For prescription refill requests, have your pharmacy contact our office and allow 72 hours for refills to be completed.    Today you received the following chemotherapy and/or immunotherapy agent: Cabazitaxel (Jevtana)   To help prevent nausea and vomiting after your treatment, we encourage you to take your nausea medication as directed.  BELOW ARE SYMPTOMS THAT SHOULD BE REPORTED IMMEDIATELY: *FEVER GREATER THAN 100.4 F (38 C) OR HIGHER *CHILLS OR SWEATING *NAUSEA AND VOMITING THAT IS NOT CONTROLLED WITH YOUR NAUSEA MEDICATION *UNUSUAL SHORTNESS OF BREATH *UNUSUAL BRUISING OR BLEEDING *URINARY PROBLEMS (pain or burning when urinating, or frequent urination) *BOWEL PROBLEMS (unusual diarrhea, constipation, pain near the anus) TENDERNESS IN MOUTH AND THROAT WITH OR WITHOUT PRESENCE OF ULCERS (sore throat, sores in mouth, or a toothache) UNUSUAL RASH, SWELLING OR PAIN  UNUSUAL VAGINAL DISCHARGE OR ITCHING   Items with * indicate a potential emergency and should be followed up as soon as possible or go to the Emergency Department if any problems should occur.  Please show the CHEMOTHERAPY ALERT CARD or IMMUNOTHERAPY ALERT CARD at  check-in to the Emergency Department and triage nurse.  Should you have questions after your visit or need to cancel or reschedule your appointment, please contact [facility]  Dept: [phone]  and follow the prompts.  Office hours are 8:00 a.m. to 4:30 p.m. Monday - Friday. Please note that voicemails left after 4:00 p.m. may not be returned until the following business day.  We are closed weekends and major holidays. You have access to a nurse at all times for urgent questions. Please call the main number to the clinic Dept: [phone] and follow the prompts.   For any non-urgent questions, you may also contact your provider using MyChart. We now offer e-Visits for anyone 18 and older to request care online for non-urgent symptoms. For details visit [email or web address].   Also download the MyChart app! Go to the app store, search "MyChart", open the app, select , and log in with your MyChart username and password.  Due to Covid, a mask is required upon entering the hospital/clinic. If you do not have a mask, one will be given to you upon arrival. For doctor visits, patients may have 1 support person aged 18 or older with them. For treatment visits, patients cannot have anyone with them due to current Covid guidelines and our immunocompromised population.  

## 2021-06-26 NOTE — Progress Notes (Signed)
Nutrition Assessment ? ? ?Reason for Assessment: weight loss  ? ? ?ASSESSMENT: 76 year old male with prostate cancer metastatic to bone. He is currently receiving capazitaxel + prednisone q21d. Patient is followed by Dr. Alen Blew ? ?Past medical history includes HTN, COPD, IDDM2, Covid19, IDA, lower extremity edema. ?-noted 4/14-4/21 hospital admission for AMS d/t UTI, anemia ? ?Met with patient during infusion. He is a poor historian, reports feeling sleepy. RN reports pt recently had received IV diphenhydramine. Patient focused on television screen throughout visit. Patient endorses poor appetite. He reports MD has restarted him on something to help him eat better today. He took this before and it worked well for him. Patient reports eating 3 meals day. His wife prepares meals for him. He reports cereals, oatmeal, bacon, eggs for breakfast. Patient unable to recall lunch or dinner meals. He is drinking Ensure daily, reports liking the juice kind he was given during hospital admission. He has the "milky" kind at home. Patient reports his lower back is hurting, requesting to raise the foot rest. He is covered with one blanket and asking for additional warm blanket. RD assisted patient with recliner as well as provided him with extra blanket.  ? ?Nutrition Focused Physical Exam:  ? ?Orbital Region: moderate ?Buccal Region: moderate ?Upper Arm Region: UTA ?Thoracic and Lumbar Region: UTA ?Temple Region: mild ?Clavicle Bone Region: UTA ?Shoulder and Acromion Bone Region: UTA ?Scapular Bone Region: UTA ?Dorsal Hand: moderate ?Patellar Region: UTA ?Anterior Thigh Region: UTA ?Posterior Calf Region: UTA ?Edema (RD assessment): UTA ?Hair: reviewed  ?Eyes: reviewed ?Mouth: reviewed  ?Skin: reviewed ?Nails: reviewed ? ? ?Medications: Megace, senokot, humalog, ferrous sulfate, tresiba, pepcid, zofran, oxycodone, compazine, imodium, magnesium, Ca-Mg-Zn, crestor ? ? ?Labs: K 3.4 ? ? ?Anthropometrics: Weights have decreased 6.3%  (13 lbs) in 3 weeks and 16.5 % (38 lb) decrease from usual weight over the last 7 months; significant.  ? ?Height: 6'1" ?Weight: 193 lb 12.8 oz ?UBW: 231 lb (11/2020) ?BMI: 25.57 ? ? ? ?NUTRITION DIAGNOSIS: Unintentional weight loss related to cancer and associated treatments as evidenced by reported poor appetite, significant 6% weight decrease in the last 3 weeks which is significant for time frame ? ? ?INTERVENTION:  ?Encouraged high protein snacks in between meals - handout with ideas given ?Suggested soft moist foods for ease of intake - handout with ideas provided ?Continue drinking Ensure Plus/equivalent, recommend 2-3 day - coupons provided  ?Contact information given  ? ? ?MONITORING, EVALUATION, GOAL: Patient will tolerate increased calories and protein to minimize further weight decline ? ? ?Next Visit: To be scheduled with treatment  ? ? ? ? ? ? ?

## 2021-06-26 NOTE — Patient Instructions (Signed)

## 2021-06-26 NOTE — Progress Notes (Signed)
Hematology and Oncology Follow Up Visit ? ?Aseem Sessums ?[id number] ?1945/03/08 76 y.o. ?06/26/2021 8:54 AM ?[facility] [place]  ? 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  ? ?Prior Therapy: ? ?He status post CT-guided biopsy of the right iliac bone. ? ?[name] started on April 20, 2019. ? ?Xtandi 160 mg daily started in April 2021.  Therapy discontinued in March 2022 due to progression of disease. ? ?Zytiga 1000 mg daily with prednisone 5 mg daily started in March 2022. ? ?Taxotere chemotherapy 75 mg per metered square started on June 08, 2020.  He is here for cycle 9 of therapy. ? ?Radiation therapy to L3 and L4 bilateral shoulders completed in February 2023.  He received 30 Gray in 10 fractions. ? ?Current therapy:  ? ? Eligard 30 mg started in April 2021.  Next Eligard will be given in July 2023. ? ? ?Jevtana chemotherapy 20 mg per metered square every 3 weeks started on December 28, 2020.  He is here for cycle 9 of therapy. ? ? ? ? ?Interim History: Mr. Behrle presents today for repeat evaluation.  Since last visit, he reports no major changes in his health.  He has tolerated the last cycle of chemotherapy without any complaints he denies any nausea, vomiting or abdominal pain.  His appetite has declined slightly and lost some weight.  His pain is manageable overall.  He does report periodic confusion.  His performance status remains marginal although has not declined rapidly.  His quality of life continues to be overall poor. ? ?Medications: Updated on review. ?Current Outpatient Medications  ?Medication Sig Dispense Refill  ? acetaminophen (TYLENOL) 500 MG tablet Take 1,000 mg by mouth daily as needed for moderate pain.    ? ASPIRIN 81 PO Take 81 mg by mouth daily.     ? Calcium-Magnesium-Zinc 333-133-5 MG TABS Take 1 tablet by mouth at bedtime.    ? Continuous Blood Gluc Sensor  (FREESTYLE LIBRE 2 SENSOR) MISC APPLY EVERY 14 DAYS, CHECK GLUCOSE BEFORE EACH MEAL, 2 HRS AFTER, IN THE MORNING AND BEFORE BEDTIME 2 each 2  ? famotidine (PEPCID) 20 MG tablet Take 20 mg by mouth daily.    ? ferrous sulfate 325 (65 FE) MG tablet Take 1 tablet (325 mg total) by mouth daily. 30 tablet 3  ? HUMALOG KWIKPEN 100 UNIT/ML KwikPen Inject 3-11 Units into the skin in the morning, at noon, and at bedtime. CBG 70 - 120: 0 units  ?CBG 121 - 150: 3 units  ?CBG 151 - 200: 4 units  ?CBG 201 - 250: 7 units  ?CBG 251 - 300: 11 units  ? ?CBG more than 350 call PCP 15 mL 11  ? lidocaine (LIDODERM) 5 % Place 2 patches onto the skin daily. Remove & Discard patch within 12 hours or as directed by MD 30 patch 0  ? lidocaine-prilocaine (EMLA) cream Apply 1 application topically as needed. 30 g 0  ? loperamide (IMODIUM) 1 MG/5ML solution Take 2 mg by mouth daily as needed for diarrhea or loose stools.    ? magnesium 30 MG tablet Take 30 mg by mouth daily.    ? megestrol (MEGACE) 400 MG/10ML suspension Take 10 mLs (400 mg total) by mouth 2 (two) times daily. 240 mL 0  ? Menthol-Camphor (TIGER BALM ARTHRITIS RUB EX) Apply 1 application topically 2 (two) times daily as needed (back pain).    ? Nutritional Supplements (,  FEEDING SUPPLEMENT, PROSOURCE PLUS) liquid Take 30 mLs by mouth 2 (two) times daily between meals. 100 mL 0  ? nystatin (MYCOSTATIN) 100000 UNIT/ML suspension Take 5 mLs (500,000 Units total) by mouth 4 (four) times daily. 60 mL 0  ? ondansetron (ZOFRAN) 4 MG tablet Take 1 tablet (4 mg total) by mouth every 6 (six) hours as needed. for nausea 20 tablet 3  ? Oxycodone HCl 10 MG TABS Take 1 tablet (10 mg total) by mouth every 4 (four) hours as needed. (Patient taking differently: Take 10 mg by mouth every 4 (four) hours as needed (for severe pain).) 90 tablet 0  ? prochlorperazine (COMPAZINE) 10 MG tablet Take 1 tablet (10 mg total) by mouth every 6 (six) hours as needed. 30 tablet 3  ? rosuvastatin (CRESTOR) 10  MG tablet Take 1 tablet (10 mg total) by mouth daily. (Patient taking differently: Take 10 mg by mouth every evening.) 90 tablet 3  ? senna-docusate (SENOKOT-S) 8.6-50 MG tablet Take 1 tablet by mouth daily. 30 tablet 0  ? TRESIBA FLEXTOUCH 100 UNIT/ML FlexTouch Pen Inject 20 Units into the skin at bedtime. 15 mL 0  ? ?No current facility-administered medications for this visit.  ? ? ? ?Allergies:  ?Allergies  ?Allergen Reactions  ? Chlorhexidine   ?  Unknown reaction  ? ? ? ? ?Physical Exam: ? ? ?Blood pressure (!) 141/75, pulse 99, temperature 98.3 ?F (36.8 ?C), temperature source Temporal, resp. rate 17, height '6\' 1"'$  (1.854 m), weight 193 lb 12.8 oz (87.9 kg), SpO2 100 %. ? ? ?ECOG: 2 ?  ? ?General appearance: Alert, awake without any distress. ?Head: Atraumatic without abnormalities ?Oropharynx: Without any thrush or ulcers. ?Eyes: No scleral icterus. ?Lymph nodes: No lymphadenopathy noted in the cervical, supraclavicular, or axillary nodes ?Heart:regular rate and rhythm, without any murmurs or gallops.   ?Lung: Clear to auscultation without any rhonchi, wheezes or dullness to percussion. ?Abdomin: Soft, nontender without any shifting dullness or ascites. ?Musculoskeletal: No clubbing or cyanosis. ?Neurological: No motor or sensory deficits. ?Skin: No rashes or lesions. ? ? ? ? ? ? ? ? ? ? ? ? ? ? ? ? ? ? ?Lab Results: ?Lab Results  ?Component Value Date  ? WBC 7.8 06/04/2021  ? HGB 9.6 (L) 06/04/2021  ? HCT 30.3 (L) 06/04/2021  ? MCV 88.3 06/04/2021  ? PLT 358 06/04/2021  ? ?  Chemistry   ?   ?Component Value Date/Time  ? NA 140 06/04/2021 1003  ? K 3.6 06/04/2021 1003  ? CL 107 06/04/2021 1003  ? CO2 27 06/04/2021 1003  ? BUN 9 06/04/2021 1003  ? CREATININE 0.68 06/04/2021 1003  ?    ?Component Value Date/Time  ? CALCIUM 7.7 (L) 06/04/2021 1003  ? ALKPHOS 403 (H) 06/04/2021 1003  ? AST 53 (H) 06/04/2021 1003  ? ALT 22 06/04/2021 1003  ? BILITOT 0.6 06/04/2021 1003  ?  ? ? ? Latest Reference Range & Units  04/11/21 11:11 05/03/21 11:21 06/04/21 10:03  ?Prostate Specific Ag, Serum 0.0 - 4.0 ng/mL 241.0 (H) 365.0 (H) 499.0 (H)  ?(H): Data is abnormally high ? ? ? ?Impression and Plan: ? ? ?76 year old man with: ? ?1.  Castration-resistant advanced prostate cancer diagnosed in 2021.   ? ?His disease status was updated at this time and treatment choices were reviewed.  He is currently on Jevtana chemotherapy without any major complications.  Risks and benefits of continuing this treatment were discussed at this time.  He appears  to have clinical benefit related to chemotherapy.  Alternative options would be transitioning into supportive care and hospice if he is no longer benefiting.  At this time, he is agreeable to continuing we have discussed potential transitioning to hospice if needed. ? ? ?2.  Androgen deprivation: Next injection will be given in July 2023. ? ? ?3.  Growth factor support: He is at risk of neutropenia and possible sepsis.  He will receive growth factor support after each cycle. ? ?4.  Prognosis and goals of care: His disease is incurable and aggressive measures are currently warranted given his reasonable performance status.  Any treatment is palliative however. ? ? ?5.  Anemia: Related to malignancy and chemotherapy.  He is receiving transfusion support as needed. ? ?6.  Bone pain: Manageable at this time with oxycodone which will be refilled for him. ? ?7.  IV access: Port-A-Cath remains in use without any issues. ? ?8.  Weight loss: We have discussed strategies to improve that including restarting Megace. ? ?9. Follow-up: He will return in 3 to 4 weeks for a follow-up visit. ? ?  ?30  minutes were dedicated to this visit.  The time was spent on reviewing laboratory data, disease status update, treatment choices and future plan of care discussion. ? ?Zola Button, MD ?5/16/20238:54 AM ? ?

## 2021-06-27 ENCOUNTER — Other Ambulatory Visit: Payer: Self-pay | Admitting: *Deleted

## 2021-06-27 DIAGNOSIS — C61 Malignant neoplasm of prostate: Secondary | ICD-10-CM

## 2021-06-27 LAB — PROSTATE-SPECIFIC AG, SERUM (LABCORP): Prostate Specific Ag, Serum: 559 ng/mL — ABNORMAL HIGH (ref 0.0–4.0)

## 2021-06-27 MED ORDER — OXYCODONE HCL 10 MG PO TABS: 10.0000 mg | ORAL_TABLET | ORAL | 0 refills | Status: DC | PRN

## 2021-06-27 MED ORDER — MEGESTROL ACETATE 625 MG/5ML PO SUSP: 625.0000 mg | Freq: Every day | ORAL | 0 refills | Status: DC

## 2021-06-28 ENCOUNTER — Other Ambulatory Visit: Payer: Medicare Other | Admitting: Hospice

## 2021-06-28 ENCOUNTER — Inpatient Hospital Stay: Payer: Medicare Other

## 2021-06-28 ENCOUNTER — Other Ambulatory Visit: Payer: Self-pay

## 2021-06-28 DIAGNOSIS — Z5111 Encounter for antineoplastic chemotherapy: Secondary | ICD-10-CM | POA: Diagnosis not present

## 2021-06-28 DIAGNOSIS — D709 Neutropenia, unspecified: Secondary | ICD-10-CM | POA: Diagnosis not present

## 2021-06-28 DIAGNOSIS — D6481 Anemia due to antineoplastic chemotherapy: Secondary | ICD-10-CM | POA: Diagnosis not present

## 2021-06-28 DIAGNOSIS — C7951 Secondary malignant neoplasm of bone: Secondary | ICD-10-CM | POA: Diagnosis not present

## 2021-06-28 DIAGNOSIS — R52 Pain, unspecified: Secondary | ICD-10-CM

## 2021-06-28 DIAGNOSIS — C61 Malignant neoplasm of prostate: Secondary | ICD-10-CM

## 2021-06-28 DIAGNOSIS — Z192 Hormone resistant malignancy status: Secondary | ICD-10-CM | POA: Diagnosis not present

## 2021-06-28 DIAGNOSIS — E119 Type 2 diabetes mellitus without complications: Secondary | ICD-10-CM

## 2021-06-28 DIAGNOSIS — Z515 Encounter for palliative care: Secondary | ICD-10-CM | POA: Diagnosis not present

## 2021-06-28 DIAGNOSIS — Z5189 Encounter for other specified aftercare: Secondary | ICD-10-CM | POA: Diagnosis not present

## 2021-06-28 MED ORDER — PEGFILGRASTIM-CBQV 6 MG/0.6ML ~~LOC~~ SOSY
6.0000 mg | PREFILLED_SYRINGE | Freq: Once | SUBCUTANEOUS | Status: AC
  Administered 2021-06-28: 6 mg via SUBCUTANEOUS
  Filled 2021-06-28: qty 0.6

## 2021-06-28 NOTE — Progress Notes (Signed)
[facility] Consult Note Telephone: [phone]  Fax: [phone]  PATIENT NAME: Stephen Lynch DOB: 1945-06-05 MRN: [id number]  PRIMARY CARE PROVIDER:   [facility] [place],  [place] [address]  REFERRING PROVIDER: [facility], [facility] [address] [place],  [place] [address]  RESPONSIBLE PARTY:  Self [name] is best number to call Contact Information     Name Relation Home Work Mobile   [place] Significant other [phone]  [phone]      Visit is to build trust and highlight Palliative Medicine as specialized medical care for people living with serious illness, aimed at facilitating better quality of life through symptoms relief, assisting with advance care planning and complex medical decision making.  [name] is present with patient during visit.  This is a follow up visit.  RECOMMENDATIONS/PLAN:   Advance Care Planning/Code Status: Reviewed code status today. Patient remains a partial code.   He desires chest compressions with ACLS medications, no intubation/mechanical ventilation.  Goals of Care: Goals of care include to maximize quality of life and symptom management.  Visit consisted of counseling and education dealing with the complex and emotionally intense issues of symptom management and palliative care in the setting of serious and potentially life-threatening illness. Palliative care team will continue to support patient, patient's family, and medical team.  Symptom management/Plan:  Metastatic Prostate cancer: continuing on Chemotherapy every 3 weeks. Follow-up with oncologist as planned.Last chemo 06/26/21, no nausea/vomiting; intermittent diarrhea managed with Imodium.  Weakness: PT/OT is ongoing. Currently ambulatory, sometimes with cane. Fall precautions reiterated.  Generalized Pain: Managed with Oxycodone. Effective.   Constipation: Managed with MiraLAX Type 2 DM: A1c 05/14/21 is 8.2 down from 10.2. May 2022.  Continue insulin and Tresiba as ordered.  Continue with diabetic diet. Continue blood sugar monitoring. Repeat A1c, routine CBC CMP. Poor appetite: with resultant weight loss. Take Megace as ordered.   Follow up: Palliative care will continue to follow for complex medical decision making, advance care planning, and clarification of goals. Return 6 weeks or prn. Encouraged to call provider sooner with any concerns.  CHIEF COMPLAINT: Palliative follow up  HISTORY OF PRESENT ILLNESS:  Stephen Lynch a 76 y.o. male with multiple morbidities requiring close monitoring and with high risk of complications and  mortality: metastatic prostate cancer to the bone, type 2 diabetes mellitus.   History of chronic back pain, weakness, COVID-19 viral infection. History obtained from review of EMR, discussion with primary team, family and/or patient. Records reviewed and summarized above. All 10 point systems reviewed and are negative except as documented in history of present illness above  Review and summarization of Epic records shows history from other than patient.   Palliative Care was asked to follow this patient o help address complex decision making in the context of advance care planning and goals of care clarification. I reviewed, as needed, available labs, patient records, imaging, studies and related documents from the EMR.    Physical Exam: Height/Weight: 6 feet 1 inch/ 193 Ibs down from 200 Ibs 6 months ago Constitutional: NAD General: Well groomed, cooperative EYES: anicteric sclera, lids intact, no discharge  ENMT: Moist mucous membrane CV: S1 S2, RRR, no LE edema Pulmonary: LCTA, no increased work of breathing, no cough, Abdomen: active BS + 4 quadrants, soft and non tender GU: no suprapubic tenderness MSK: weakness, ambulatory Skin: warm and dry, no rashes or wounds on visible  skin Neuro:  weakness,  otherwise non focal Psych: non-anxious affect Hem/lymph/immuno: no widespread bruising   PERTINENT MEDICATIONS:  Outpatient Encounter Medications as of 06/28/2021  Medication Sig   acetaminophen (TYLENOL) 500 MG tablet Take 1,000 mg by mouth daily as needed for moderate pain.   ASPIRIN 81 PO Take 81 mg by mouth daily.    Calcium-Magnesium-Zinc 333-133-5 MG TABS Take 1 tablet by mouth at bedtime.   Continuous Blood Gluc Sensor (FREESTYLE LIBRE 2 SENSOR) MISC APPLY EVERY 14 DAYS, CHECK GLUCOSE BEFORE EACH MEAL, 2 HRS AFTER, IN THE MORNING AND BEFORE BEDTIME   famotidine (PEPCID) 20 MG tablet Take 20 mg by mouth daily.   ferrous sulfate 325 (65 FE) MG tablet Take 1 tablet (325 mg total) by mouth daily.   HUMALOG KWIKPEN 100 UNIT/ML KwikPen Inject 3-11 Units into the skin in the morning, at noon, and at bedtime. CBG 70 - 120: 0 units  CBG 121 - 150: 3 units  CBG 151 - 200: 4 units  CBG 201 - 250: 7 units  CBG 251 - 300: 11 units   CBG more than 350 call PCP   lidocaine (LIDODERM) 5 % Place 2 patches onto the skin daily. Remove & Discard patch within 12 hours or as directed by MD   lidocaine-prilocaine (EMLA) cream Apply 1 application topically as needed.   loperamide (IMODIUM) 1 MG/5ML solution Take 2 mg by mouth daily as needed for diarrhea or loose stools.   magnesium 30 MG tablet Take 30 mg by mouth daily.   megestrol (MEGACE ES) 625 MG/5ML suspension Take 5 mLs (625 mg total) by mouth daily.   megestrol (MEGACE) 400 MG/10ML suspension Take 10 mLs (400 mg total) by mouth 2 (two) times daily.   Menthol-Camphor (TIGER BALM ARTHRITIS RUB EX) Apply 1 application topically 2 (two) times daily as needed (back pain).   Nutritional Supplements (,FEEDING SUPPLEMENT, PROSOURCE PLUS) liquid Take 30 mLs by mouth 2 (two) times daily between meals.   nystatin (MYCOSTATIN) 100000 UNIT/ML suspension Take 5 mLs (500,000 Units total) by mouth 4 (four) times daily.   ondansetron (ZOFRAN) 4 MG tablet Take 1  tablet (4 mg total) by mouth every 6 (six) hours as needed. for nausea   Oxycodone HCl 10 MG TABS Take 1 tablet (10 mg total) by mouth every 4 (four) hours as needed.   prochlorperazine (COMPAZINE) 10 MG tablet Take 1 tablet (10 mg total) by mouth every 6 (six) hours as needed.   rosuvastatin (CRESTOR) 10 MG tablet Take 1 tablet (10 mg total) by mouth daily. (Patient taking differently: Take 10 mg by mouth every evening.)   senna-docusate (SENOKOT-S) 8.6-50 MG tablet Take 1 tablet by mouth daily.   TRESIBA FLEXTOUCH 100 UNIT/ML FlexTouch Pen Inject 20 Units into the skin at bedtime.   No facility-administered encounter medications on file as of 06/28/2021.    HOSPICE ELIGIBILITY/DIAGNOSIS: TBD  PAST MEDICAL HISTORY:  Past Medical History:  Diagnosis Date   Cancer ([facility])    prostate   Diabetes mellitus without complication ([place])    Dyslipidemia    Hypertension       ALLERGIES:  Allergies  Allergen Reactions   Chlorhexidine     Unknown reaction     I spent 60 minutes providing this consultation; this includes time spent with patient/family, chart review and documentation. More than 50% of the time in this consultation was spent on counseling and coordinating communication   Thank you for the opportunity to participate in the care  of Nnaemeka Samson Please call our office at [phone] if we can be of additional assistance.  Note: Portions of this note were generated with [employer]. Dictation errors may occur despite best attempts at proofreading.  Teodoro Spray, NP

## 2021-06-29 DIAGNOSIS — G9341 Metabolic encephalopathy: Secondary | ICD-10-CM | POA: Diagnosis not present

## 2021-06-29 DIAGNOSIS — E1165 Type 2 diabetes mellitus with hyperglycemia: Secondary | ICD-10-CM | POA: Diagnosis not present

## 2021-06-29 DIAGNOSIS — I10 Essential (primary) hypertension: Secondary | ICD-10-CM | POA: Diagnosis not present

## 2021-06-29 DIAGNOSIS — C7951 Secondary malignant neoplasm of bone: Secondary | ICD-10-CM | POA: Diagnosis not present

## 2021-06-29 DIAGNOSIS — N390 Urinary tract infection, site not specified: Secondary | ICD-10-CM | POA: Diagnosis not present

## 2021-06-29 DIAGNOSIS — C61 Malignant neoplasm of prostate: Secondary | ICD-10-CM | POA: Diagnosis not present

## 2021-07-02 DIAGNOSIS — C61 Malignant neoplasm of prostate: Secondary | ICD-10-CM | POA: Diagnosis not present

## 2021-07-02 DIAGNOSIS — C7951 Secondary malignant neoplasm of bone: Secondary | ICD-10-CM | POA: Diagnosis not present

## 2021-07-02 DIAGNOSIS — I10 Essential (primary) hypertension: Secondary | ICD-10-CM | POA: Diagnosis not present

## 2021-07-02 DIAGNOSIS — E1165 Type 2 diabetes mellitus with hyperglycemia: Secondary | ICD-10-CM | POA: Diagnosis not present

## 2021-07-02 DIAGNOSIS — G9341 Metabolic encephalopathy: Secondary | ICD-10-CM | POA: Diagnosis not present

## 2021-07-02 DIAGNOSIS — N390 Urinary tract infection, site not specified: Secondary | ICD-10-CM | POA: Diagnosis not present

## 2021-07-03 ENCOUNTER — Other Ambulatory Visit: Payer: Self-pay | Admitting: *Deleted

## 2021-07-03 DIAGNOSIS — C61 Malignant neoplasm of prostate: Secondary | ICD-10-CM

## 2021-07-03 MED ORDER — OXYCODONE HCL 5 MG PO TABS: 10.0000 mg | ORAL_TABLET | ORAL | 0 refills | Status: DC | PRN

## 2021-07-05 ENCOUNTER — Inpatient Hospital Stay (HOSPITAL_COMMUNITY): Payer: Medicare Other

## 2021-07-05 ENCOUNTER — Encounter (HOSPITAL_COMMUNITY): Payer: Self-pay

## 2021-07-05 ENCOUNTER — Emergency Department (HOSPITAL_COMMUNITY): Payer: Medicare Other

## 2021-07-05 ENCOUNTER — Other Ambulatory Visit: Payer: Self-pay

## 2021-07-05 ENCOUNTER — Inpatient Hospital Stay (HOSPITAL_COMMUNITY)
Admission: EM | Admit: 2021-07-05 | Discharge: 2021-07-10 | DRG: 378 | Disposition: A | Discharge status: Home / Self Care | Payer: Medicare Other | Attending: Internal Medicine | Admitting: Internal Medicine

## 2021-07-05 ENCOUNTER — Telehealth: Payer: Self-pay | Admitting: Oncology

## 2021-07-05 DIAGNOSIS — Z888 Allergy status to other drugs, medicaments and biological substances status: Secondary | ICD-10-CM

## 2021-07-05 DIAGNOSIS — Z66 Do not resuscitate: Secondary | ICD-10-CM | POA: Diagnosis not present

## 2021-07-05 DIAGNOSIS — K922 Gastrointestinal hemorrhage, unspecified: Secondary | ICD-10-CM | POA: Diagnosis not present

## 2021-07-05 DIAGNOSIS — E1165 Type 2 diabetes mellitus with hyperglycemia: Secondary | ICD-10-CM | POA: Diagnosis present

## 2021-07-05 DIAGNOSIS — R945 Abnormal results of liver function studies: Secondary | ICD-10-CM | POA: Diagnosis not present

## 2021-07-05 DIAGNOSIS — R791 Abnormal coagulation profile: Secondary | ICD-10-CM | POA: Diagnosis present

## 2021-07-05 DIAGNOSIS — Z7189 Other specified counseling: Secondary | ICD-10-CM

## 2021-07-05 DIAGNOSIS — E8809 Other disorders of plasma-protein metabolism, not elsewhere classified: Secondary | ICD-10-CM | POA: Diagnosis present

## 2021-07-05 DIAGNOSIS — E785 Hyperlipidemia, unspecified: Secondary | ICD-10-CM | POA: Diagnosis present

## 2021-07-05 DIAGNOSIS — R64 Cachexia: Secondary | ICD-10-CM | POA: Diagnosis present

## 2021-07-05 DIAGNOSIS — E119 Type 2 diabetes mellitus without complications: Secondary | ICD-10-CM

## 2021-07-05 DIAGNOSIS — K921 Melena: Secondary | ICD-10-CM | POA: Diagnosis not present

## 2021-07-05 DIAGNOSIS — Z794 Long term (current) use of insulin: Secondary | ICD-10-CM | POA: Diagnosis not present

## 2021-07-05 DIAGNOSIS — D72829 Elevated white blood cell count, unspecified: Secondary | ICD-10-CM

## 2021-07-05 DIAGNOSIS — D696 Thrombocytopenia, unspecified: Secondary | ICD-10-CM | POA: Diagnosis not present

## 2021-07-05 DIAGNOSIS — C787 Secondary malignant neoplasm of liver and intrahepatic bile duct: Secondary | ICD-10-CM

## 2021-07-05 DIAGNOSIS — R0682 Tachypnea, not elsewhere classified: Secondary | ICD-10-CM | POA: Diagnosis not present

## 2021-07-05 DIAGNOSIS — D62 Acute posthemorrhagic anemia: Secondary | ICD-10-CM | POA: Diagnosis not present

## 2021-07-05 DIAGNOSIS — R0689 Other abnormalities of breathing: Secondary | ICD-10-CM | POA: Diagnosis not present

## 2021-07-05 DIAGNOSIS — Z6825 Body mass index (BMI) 25.0-25.9, adult: Secondary | ICD-10-CM

## 2021-07-05 DIAGNOSIS — D649 Anemia, unspecified: Secondary | ICD-10-CM | POA: Diagnosis not present

## 2021-07-05 DIAGNOSIS — R54 Age-related physical debility: Secondary | ICD-10-CM | POA: Diagnosis present

## 2021-07-05 DIAGNOSIS — Z87891 Personal history of nicotine dependence: Secondary | ICD-10-CM

## 2021-07-05 DIAGNOSIS — Z79818 Long term (current) use of other agents affecting estrogen receptors and estrogen levels: Secondary | ICD-10-CM | POA: Diagnosis not present

## 2021-07-05 DIAGNOSIS — K625 Hemorrhage of anus and rectum: Secondary | ICD-10-CM | POA: Diagnosis not present

## 2021-07-05 DIAGNOSIS — D689 Coagulation defect, unspecified: Secondary | ICD-10-CM | POA: Diagnosis not present

## 2021-07-05 DIAGNOSIS — Z7982 Long term (current) use of aspirin: Secondary | ICD-10-CM

## 2021-07-05 DIAGNOSIS — N179 Acute kidney failure, unspecified: Secondary | ICD-10-CM | POA: Diagnosis present

## 2021-07-05 DIAGNOSIS — Z79899 Other long term (current) drug therapy: Secondary | ICD-10-CM

## 2021-07-05 DIAGNOSIS — I959 Hypotension, unspecified: Secondary | ICD-10-CM | POA: Diagnosis not present

## 2021-07-05 DIAGNOSIS — I10 Essential (primary) hypertension: Secondary | ICD-10-CM | POA: Diagnosis not present

## 2021-07-05 DIAGNOSIS — K254 Chronic or unspecified gastric ulcer with hemorrhage: Secondary | ICD-10-CM | POA: Diagnosis not present

## 2021-07-05 DIAGNOSIS — C61 Malignant neoplasm of prostate: Secondary | ICD-10-CM | POA: Diagnosis present

## 2021-07-05 DIAGNOSIS — E872 Acidosis, unspecified: Secondary | ICD-10-CM | POA: Diagnosis present

## 2021-07-05 DIAGNOSIS — C7951 Secondary malignant neoplasm of bone: Secondary | ICD-10-CM | POA: Diagnosis present

## 2021-07-05 DIAGNOSIS — R739 Hyperglycemia, unspecified: Secondary | ICD-10-CM | POA: Diagnosis not present

## 2021-07-05 DIAGNOSIS — Z9221 Personal history of antineoplastic chemotherapy: Secondary | ICD-10-CM

## 2021-07-05 DIAGNOSIS — E46 Unspecified protein-calorie malnutrition: Secondary | ICD-10-CM | POA: Diagnosis present

## 2021-07-05 DIAGNOSIS — R197 Diarrhea, unspecified: Secondary | ICD-10-CM | POA: Diagnosis not present

## 2021-07-05 DIAGNOSIS — D500 Iron deficiency anemia secondary to blood loss (chronic): Secondary | ICD-10-CM | POA: Diagnosis not present

## 2021-07-05 DIAGNOSIS — R0602 Shortness of breath: Secondary | ICD-10-CM | POA: Diagnosis not present

## 2021-07-05 DIAGNOSIS — R000 Tachycardia, unspecified: Secondary | ICD-10-CM | POA: Diagnosis not present

## 2021-07-05 LAB — CBC WITH DIFFERENTIAL/PLATELET
Abs Immature Granulocytes: 7.7 10*3/uL — ABNORMAL HIGH (ref 0.00–0.07)
Band Neutrophils: 5 %
Basophils Absolute: 0 10*3/uL (ref 0.0–0.1)
Basophils Relative: 0 %
Eosinophils Absolute: 0 10*3/uL (ref 0.0–0.5)
Eosinophils Relative: 0 %
HCT: 18.2 % — ABNORMAL LOW (ref 39.0–52.0)
Hemoglobin: 5.6 g/dL — CL (ref 13.0–17.0)
Lymphocytes Relative: 6 %
Lymphs Abs: 2.9 10*3/uL (ref 0.7–4.0)
MCH: 31.5 pg (ref 26.0–34.0)
MCHC: 30.8 g/dL (ref 30.0–36.0)
MCV: 102.2 fL — ABNORMAL HIGH (ref 80.0–100.0)
Metamyelocytes Relative: 9 %
Monocytes Absolute: 1.9 10*3/uL — ABNORMAL HIGH (ref 0.1–1.0)
Monocytes Relative: 4 %
Myelocytes: 7 %
Neutro Abs: 35.7 10*3/uL — ABNORMAL HIGH (ref 1.7–7.7)
Neutrophils Relative %: 69 %
Platelets: 189 10*3/uL (ref 150–400)
RBC: 1.78 MIL/uL — ABNORMAL LOW (ref 4.22–5.81)
RDW: 27.1 % — ABNORMAL HIGH (ref 11.5–15.5)
WBC: 48.2 10*3/uL — ABNORMAL HIGH (ref 4.0–10.5)
nRBC: 5.1 % — ABNORMAL HIGH (ref 0.0–0.2)

## 2021-07-05 LAB — TROPONIN I (HIGH SENSITIVITY)
Troponin I (High Sensitivity): 47 ng/L — ABNORMAL HIGH (ref <18)
Troponin I (High Sensitivity): 50 ng/L — ABNORMAL HIGH (ref <18)

## 2021-07-05 LAB — URINALYSIS, ROUTINE W REFLEX MICROSCOPIC
Bilirubin Urine: NEGATIVE
Glucose, UA: NEGATIVE mg/dL
Ketones, ur: NEGATIVE mg/dL
Nitrite: NEGATIVE
Protein, ur: 30 mg/dL — AB
Specific Gravity, Urine: 1.017 (ref 1.005–1.030)
WBC, UA: >50 WBC/hpf — ABNORMAL HIGH (ref 0–5)
pH: 5 (ref 5.0–8.0)

## 2021-07-05 LAB — POC OCCULT BLOOD, ED: Fecal Occult Bld: POSITIVE — AB

## 2021-07-05 LAB — I-STAT CHEM 8, ED
BUN: 29 mg/dL — ABNORMAL HIGH (ref 8–23)
Calcium, Ion: 1.15 mmol/L (ref 1.15–1.40)
Chloride: 112 mmol/L — ABNORMAL HIGH (ref 98–111)
Creatinine, Ser: 1.2 mg/dL (ref 0.61–1.24)
Glucose, Bld: 193 mg/dL — ABNORMAL HIGH (ref 70–99)
HCT: 20 % — ABNORMAL LOW (ref 39.0–52.0)
Hemoglobin: 6.8 g/dL — CL (ref 13.0–17.0)
Potassium: 4.2 mmol/L (ref 3.5–5.1)
Sodium: 142 mmol/L (ref 135–145)
TCO2: 13 mmol/L — ABNORMAL LOW (ref 22–32)

## 2021-07-05 LAB — BASIC METABOLIC PANEL
Anion gap: 17 — ABNORMAL HIGH (ref 5–15)
BUN: 34 mg/dL — ABNORMAL HIGH (ref 8–23)
CO2: 12 mmol/L — ABNORMAL LOW (ref 22–32)
Calcium: 8.1 mg/dL — ABNORMAL LOW (ref 8.9–10.3)
Chloride: 115 mmol/L — ABNORMAL HIGH (ref 98–111)
Creatinine, Ser: 1.43 mg/dL — ABNORMAL HIGH (ref 0.61–1.24)
GFR, Estimated: 51 mL/min — ABNORMAL LOW (ref >60)
Glucose, Bld: 177 mg/dL — ABNORMAL HIGH (ref 70–99)
Potassium: 4.3 mmol/L (ref 3.5–5.1)
Sodium: 144 mmol/L (ref 135–145)

## 2021-07-05 LAB — HEPATIC FUNCTION PANEL
ALT: 23 U/L (ref 0–44)
AST: 55 U/L — ABNORMAL HIGH (ref 15–41)
Albumin: 2 g/dL — ABNORMAL LOW (ref 3.5–5.0)
Alkaline Phosphatase: 379 U/L — ABNORMAL HIGH (ref 38–126)
Bilirubin, Direct: 0.3 mg/dL — ABNORMAL HIGH (ref 0.0–0.2)
Indirect Bilirubin: 0.7 mg/dL (ref 0.3–0.9)
Total Bilirubin: 1 mg/dL (ref 0.3–1.2)
Total Protein: 4.5 g/dL — ABNORMAL LOW (ref 6.5–8.1)

## 2021-07-05 LAB — PROTIME-INR
INR: 2.1 — ABNORMAL HIGH (ref 0.8–1.2)
Prothrombin Time: 23.7 seconds — ABNORMAL HIGH (ref 11.4–15.2)

## 2021-07-05 LAB — GLUCOSE, CAPILLARY
Glucose-Capillary: 163 mg/dL — ABNORMAL HIGH (ref 70–99)
Glucose-Capillary: 222 mg/dL — ABNORMAL HIGH (ref 70–99)

## 2021-07-05 LAB — PREPARE RBC (CROSSMATCH)

## 2021-07-05 LAB — AMMONIA: Ammonia: 73 umol/L — ABNORMAL HIGH (ref 9–35)

## 2021-07-05 MED ORDER — SODIUM BICARBONATE 8.4 % IV SOLN
INTRAVENOUS | Status: DC
  Filled 2021-07-05 (×2): qty 150

## 2021-07-05 MED ORDER — SODIUM CHLORIDE 0.9% IV SOLUTION: Freq: Once | INTRAVENOUS | Status: AC

## 2021-07-05 MED ORDER — VANCOMYCIN HCL 1500 MG/300ML IV SOLN: 1500.0000 mg | INTRAVENOUS | Status: DC

## 2021-07-05 MED ORDER — OXYCODONE HCL 5 MG PO TABS
10.0000 mg | ORAL_TABLET | ORAL | Status: DC | PRN
  Administered 2021-07-09: 10 mg via ORAL
  Filled 2021-07-05: qty 2

## 2021-07-05 MED ORDER — ACETAMINOPHEN 325 MG PO TABS: 650.0000 mg | ORAL_TABLET | Freq: Four times a day (QID) | ORAL | Status: DC | PRN

## 2021-07-05 MED ORDER — ACETAMINOPHEN 650 MG RE SUPP: 650.0000 mg | Freq: Four times a day (QID) | RECTAL | Status: DC | PRN

## 2021-07-05 MED ORDER — PANTOPRAZOLE INFUSION (NEW) - SIMPLE MED
8.0000 mg/h | INTRAVENOUS | Status: AC
  Administered 2021-07-05 – 2021-07-08 (×8): 8 mg/h via INTRAVENOUS
  Filled 2021-07-05 (×3): qty 80
  Filled 2021-07-05: qty 100
  Filled 2021-07-05 (×4): qty 80
  Filled 2021-07-05 (×2): qty 100
  Filled 2021-07-05: qty 80

## 2021-07-05 MED ORDER — VITAMIN K1 10 MG/ML IJ SOLN
10.0000 mg | Freq: Once | INTRAVENOUS | Status: AC
  Administered 2021-07-05: 10 mg via INTRAVENOUS
  Filled 2021-07-05: qty 1

## 2021-07-05 MED ORDER — SODIUM CHLORIDE 0.9 % IV BOLUS
1000.0000 mL | Freq: Once | INTRAVENOUS | Status: AC
  Administered 2021-07-05: 1000 mL via INTRAVENOUS

## 2021-07-05 MED ORDER — SODIUM CHLORIDE 0.9 % IV SOLN
2.0000 g | Freq: Two times a day (BID) | INTRAVENOUS | Status: DC
  Administered 2021-07-05: 2 g via INTRAVENOUS
  Filled 2021-07-05: qty 12.5

## 2021-07-05 MED ORDER — ONDANSETRON HCL 4 MG/2ML IJ SOLN
4.0000 mg | Freq: Four times a day (QID) | INTRAMUSCULAR | Status: DC | PRN
  Administered 2021-07-05: 4 mg via INTRAVENOUS
  Filled 2021-07-05: qty 2

## 2021-07-05 MED ORDER — INSULIN ASPART 100 UNIT/ML IJ SOLN
0.0000 [IU] | Freq: Three times a day (TID) | INTRAMUSCULAR | Status: DC
  Administered 2021-07-06 (×3): 2 [IU] via SUBCUTANEOUS
  Administered 2021-07-07: 9 [IU] via SUBCUTANEOUS
  Administered 2021-07-07: 2 [IU] via SUBCUTANEOUS
  Administered 2021-07-07: 5 [IU] via SUBCUTANEOUS
  Administered 2021-07-08: 7 [IU] via SUBCUTANEOUS

## 2021-07-05 MED ORDER — INSULIN ASPART 100 UNIT/ML IJ SOLN
0.0000 [IU] | Freq: Every day | INTRAMUSCULAR | Status: DC
  Administered 2021-07-05: 2 [IU] via SUBCUTANEOUS
  Administered 2021-07-07: 5 [IU] via SUBCUTANEOUS

## 2021-07-05 MED ORDER — ORAL CARE MOUTH RINSE
15.0000 mL | Freq: Two times a day (BID) | OROMUCOSAL | Status: DC
  Administered 2021-07-05 – 2021-07-10 (×9): 15 mL via OROMUCOSAL

## 2021-07-05 MED ORDER — SODIUM CHLORIDE 0.9 % IV SOLN
10.0000 mL/h | Freq: Once | INTRAVENOUS | Status: AC
  Administered 2021-07-05: 10 mL/h via INTRAVENOUS

## 2021-07-05 MED ORDER — ALBUMIN HUMAN 25 % IV SOLN
50.0000 g | Freq: Once | INTRAVENOUS | Status: AC
  Administered 2021-07-05: 50 g via INTRAVENOUS
  Filled 2021-07-05: qty 200

## 2021-07-05 MED ORDER — ONDANSETRON HCL 4 MG PO TABS: 4.0000 mg | ORAL_TABLET | Freq: Four times a day (QID) | ORAL | Status: DC | PRN

## 2021-07-05 MED ORDER — SODIUM CHLORIDE 0.9 % IV SOLN: INTRAVENOUS | Status: DC

## 2021-07-05 MED ORDER — SODIUM CHLORIDE 0.9 % IV SOLN
50.0000 ug/h | INTRAVENOUS | Status: DC
  Administered 2021-07-05 – 2021-07-06 (×3): 50 ug/h via INTRAVENOUS
  Filled 2021-07-05 (×4): qty 1

## 2021-07-05 MED ORDER — PANTOPRAZOLE SODIUM 40 MG IV SOLR
40.0000 mg | Freq: Once | INTRAVENOUS | Status: AC
  Administered 2021-07-05: 40 mg via INTRAVENOUS
  Filled 2021-07-05: qty 10

## 2021-07-05 MED ORDER — PANTOPRAZOLE SODIUM 40 MG IV SOLR
40.0000 mg | Freq: Two times a day (BID) | INTRAVENOUS | Status: DC
  Administered 2021-07-09: 40 mg via INTRAVENOUS
  Filled 2021-07-05: qty 10

## 2021-07-05 MED ORDER — VANCOMYCIN HCL 2000 MG/400ML IV SOLN
2000.0000 mg | Freq: Once | INTRAVENOUS | Status: AC
  Administered 2021-07-05: 2000 mg via INTRAVENOUS
  Filled 2021-07-05: qty 400

## 2021-07-05 MED ORDER — OCTREOTIDE LOAD VIA INFUSION
50.0000 ug | Freq: Once | INTRAVENOUS | Status: AC
  Administered 2021-07-05: 50 ug via INTRAVENOUS
  Filled 2021-07-05: qty 25

## 2021-07-05 MED ORDER — MEGESTROL ACETATE 400 MG/10ML PO SUSP
400.0000 mg | Freq: Two times a day (BID) | ORAL | Status: DC
  Filled 2021-07-05: qty 10

## 2021-07-05 NOTE — Telephone Encounter (Signed)
alled patient regarding upcoming appointment, left a voicemail.

## 2021-07-05 NOTE — ED Triage Notes (Signed)
Pt BIB EMS from home. Pt reports SHOB x2 days. Yesterday family noticed dark tarry stool. Pt has also had increased weakness. A&O x4.  HR 140 to 110 BP 100/60 20G LW  359m NS CBG 275 98% on 3L O2 (not normally on O2)

## 2021-07-05 NOTE — ED Provider Notes (Signed)
[facility] DEPT Provider Note   CSN: [id number] Arrival date & time: 07/05/21  1059     History  Chief Complaint  Patient presents with   Shortness of Breath    Stephen Lynch is a 76 y.o. male.  HPI 76 year old male with metastatic prostate cancer, on palliative care, type 2 diabetes, presents today complaining of abdominal pain and chest pain.  He states that he began having abdominal pain last night and had chest pain that began in the same time.  He points to the substernal region to the epigastric region.  He also began having some green stools through the night and reports he has had multiple ones.  He is having pain and is feeling generally weak.  He reports that he was walking yesterday with a cane or walker.  Today he is so weak that he is unable to sit up on the bed.  Denies being on any blood thinners.  Denies any previous similar symptoms.     Home Medications Prior to Admission medications   Medication Sig Start Date End Date Taking? Authorizing Provider  acetaminophen (TYLENOL) 500 MG tablet Take 1,000 mg by mouth daily as needed for moderate pain.    [provider]  ASPIRIN 81 PO Take 81 mg by mouth daily.     [provider]  Calcium-Magnesium-Zinc [phone] MG TABS Take 1 tablet by mouth at bedtime.    [provider]  Continuous Blood Gluc Sensor (FREESTYLE LIBRE 2 SENSOR) MISC APPLY EVERY 14 DAYS, CHECK GLUCOSE BEFORE EACH MEAL, 2 HRS AFTER, IN THE MORNING AND BEFORE BEDTIME 02/26/21   [name], MD  famotidine (PEPCID) 20 MG tablet Take 20 mg by mouth daily. 05/22/21   [provider]  ferrous sulfate 325 (65 FE) MG tablet Take 1 tablet (325 mg total) by mouth daily. 06/02/21   Regalado, Belkys A, MD  HUMALOG KWIKPEN 100 UNIT/ML KwikPen Inject 3-11 Units into the skin in the morning, at noon, and at bedtime. CBG 70 - 120: 0 units  CBG 121 - 150: 3 units  CBG 151 - 200: 4 units  CBG 201 - 250: 7  units  CBG 251 - 300: 11 units   CBG more than 350 call PCP 06/01/21   Regalado, Belkys A, MD  lidocaine (LIDODERM) 5 % Place 2 patches onto the skin daily. Remove & Discard patch within 12 hours or as directed by MD 03/26/19   Flora Lipps, MD  lidocaine-prilocaine (EMLA) cream Apply 1 application topically as needed. 03/26/21   [name], MD  loperamide (IMODIUM) 1 MG/5ML solution Take 2 mg by mouth daily as needed for diarrhea or loose stools.    [provider]  magnesium 30 MG tablet Take 30 mg by mouth daily.    [provider]  megestrol (MEGACE ES) 625 MG/5ML suspension Take 5 mLs (625 mg total) by mouth daily. 06/27/21   [name], MD  megestrol (MEGACE) 400 MG/10ML suspension Take 10 mLs (400 mg total) by mouth 2 (two) times daily. 06/01/21   Regalado, Belkys A, MD  Menthol-Camphor (TIGER BALM ARTHRITIS RUB EX) Apply 1 application topically 2 (two) times daily as needed (back pain).    [provider]  Nutritional Supplements (,FEEDING SUPPLEMENT, PROSOURCE PLUS) liquid Take 30 mLs by mouth 2 (two) times daily between meals. 06/01/21   Regalado, Belkys A, MD  nystatin (MYCOSTATIN) 100000 UNIT/ML suspension Take 5 mLs (500,000 Units total) by mouth 4 (four) times  daily. 06/01/21   Regalado, Belkys A, MD  ondansetron (ZOFRAN) 4 MG tablet Take 1 tablet (4 mg total) by mouth every 6 (six) hours as needed. for nausea 05/03/21   [name], MD  oxyCODONE (OXY IR/ROXICODONE) 5 MG immediate release tablet Take 2 tablets (10 mg total) by mouth every 4 (four) hours as needed for severe pain. 07/03/21   [name], MD  Oxycodone HCl 10 MG TABS Take 1 tablet (10 mg total) by mouth every 4 (four) hours as needed. 06/27/21   [name], MD  prochlorperazine (COMPAZINE) 10 MG tablet Take 1 tablet (10 mg total) by mouth every 6 (six) hours as needed. 03/26/21 05/27/22  [name], MD  rosuvastatin (CRESTOR) 10 MG tablet Take 1 tablet (10 mg total) by mouth  daily. Patient taking differently: Take 10 mg by mouth every evening. 09/25/16   [name], MD  senna-docusate (SENOKOT-S) 8.6-50 MG tablet Take 1 tablet by mouth daily. 06/02/21   Regalado, Belkys A, MD  TRESIBA FLEXTOUCH 100 UNIT/ML FlexTouch Pen Inject 20 Units into the skin at bedtime. 06/01/21   Regalado, Jerald Kief A, MD      Allergies    Chlorhexidine    Review of Systems   Review of Systems  Physical Exam Updated Vital Signs BP 108/67   Pulse (!) 112   Temp (!) 97.5 F (36.4 C) (Axillary)   Resp (!) 31   SpO2 98%  Physical Exam Vitals and nursing note reviewed.  Constitutional:      General: He is in acute distress.     Appearance: He is ill-appearing.  HENT:     Head: Normocephalic.     Mouth/Throat:     Pharynx: Oropharynx is clear.  Eyes:     Pupils: Pupils are equal, round, and reactive to light.     Comments: Conjunctive are pale  Cardiovascular:     Rate and Rhythm: Normal rate and regular rhythm.  Pulmonary:     Effort: Pulmonary effort is normal.     Breath sounds: Normal breath sounds.  Chest:     Chest wall: No mass or deformity.  Abdominal:     General: Bowel sounds are normal.     Palpations: Abdomen is soft.     Comments: Mild epigastric tenderness palpation no rebound abdomen is soft  Genitourinary:    Rectum: Guaiac result positive.     Comments: Maroon stool is grossly present Musculoskeletal:        General: Normal range of motion.     Cervical back: Normal range of motion.  Skin:    General: Skin is warm and dry.     Capillary Refill: Capillary refill takes less than 2 seconds.  Neurological:     General: No focal deficit present.     Mental Status: He is alert.  Psychiatric:        Mood and Affect: Mood normal.    ED Results / Procedures / Treatments   Labs (all labs ordered are listed, but only abnormal results are displayed) Labs Reviewed  AMMONIA - Abnormal; Notable for the following components:      Result Value   Ammonia  73 (*)    All other components within normal limits  I-STAT CHEM 8, ED - Abnormal; Notable for the following components:   Chloride 112 (*)    BUN 29 (*)    Glucose, Bld 193 (*)    TCO2 13 (*)    Hemoglobin 6.8 (*)  HCT 20.0 (*)    All other components within normal limits  POC OCCULT BLOOD, ED - Abnormal; Notable for the following components:   Fecal Occult Bld POSITIVE (*)    All other components within normal limits  CBC WITH DIFFERENTIAL/PLATELET  PROTIME-INR  TYPE AND SCREEN  PREPARE RBC (CROSSMATCH)  TROPONIN I (HIGH SENSITIVITY)    EKG EKG Interpretation  Date/Time:  Thursday Jul 05 2021 11:09:23 EDT Ventricular Rate:  109 PR Interval:  133 QRS Duration: 77 QT Interval:  376 QTC Calculation: 507 R Axis:   59 Text Interpretation: Sinus tachycardia Borderline abnrm T, anterolateral leads Prolonged QT interval Confirmed by Pattricia Boss [phone]) on 07/05/2021 11:59:47 AM  Radiology DG Chest Port 1 View  Result Date: 07/05/2021 CLINICAL DATA:  Shortness of breath. EXAM: PORTABLE CHEST 1 VIEW COMPARISON:  May 25, 2021. FINDINGS: The heart size and mediastinal contours are within normal limits. Both lungs are clear. Right internal jugular Port-A-Cath is unchanged in position. Stable diffuse sclerosis of visualized skeleton is noted consistent with metastatic disease. IMPRESSION: No acute cardiopulmonary abnormality is noted. Stable diffuse sclerosis of visualized skeleton consistent with metastatic disease. Electronically Signed   By: Marijo Conception M.D.   On: 07/05/2021 12:18    Procedures .Critical Care Performed by: Pattricia Boss, MD Authorized by: Pattricia Boss, MD   Critical care provider statement:    Critical care time (minutes):  20   Critical care was necessary to treat or prevent imminent or life-threatening deterioration of the following conditions:  Respiratory failure and shock   Critical care was time spent personally by me on the following activities:   Development of treatment plan with patient or surrogate, discussions with consultants, evaluation of patient's response to treatment, examination of patient, ordering and review of laboratory studies, ordering and review of radiographic studies, ordering and performing treatments and interventions, pulse oximetry, re-evaluation of patient's condition, review of old charts and obtaining history from patient or surrogate   Care discussed with: admitting provider      Medications Ordered in ED Medications  octreotide (SANDOSTATIN) 2 mcg/mL load via infusion 50 mcg (50 mcg Intravenous Bolus from Bag 07/05/21 1244)    And  octreotide (SANDOSTATIN) 500 mcg in sodium chloride 0.9 % 250 mL (2 mcg/mL) infusion (50 mcg/hr Intravenous New Bag/Given 07/05/21 1244)  0.9 %  sodium chloride infusion (has no administration in time range)  sodium chloride 0.9 % bolus 1,000 mL (1,000 mLs Intravenous New Bag/Given (Non-Interop) 07/05/21 1221)  pantoprazole (PROTONIX) injection 40 mg (40 mg Intravenous Given 07/05/21 1224)    ED Course/ Medical Decision Making/ A&P Clinical Course as of 07/05/21 1309  Thu Jul 05, 2021  1255 I-STAT reviewed and hemoglobin has decreased to 6.8 with prior hemoglobin of 10 9 days ago. [DR]  8182 Chest x-Adalai Perl reviewed interpreted limits of acute abnormality radiologist interpretation concurs [DR]    Clinical Course User Index [DR] Pattricia Boss, MD                           Medical Decision Making Patient presents today with chest pain and rectal bleeding 1-Rectal bleeding-abdomen is soft and nttp. Patient with gross maroon stool on exam First hemoglobin returned at 6.8 Patient has received Protonix and octreotide 2 units of packed red blood cells are ordered GI consult ordered Platelets normal Patient on aspirin 81 mg no other blood thinners or nsaid noted 2-chest pain- no ekg findings, may be secondary to  gi or demand ischemia with hgb significantly decreased 3- palliative  care- patient with palliative care consult and   Amount and/or Complexity of Data Reviewed Labs: ordered. Radiology: ordered. Discussion of management or test interpretation with external provider(s): Care discussed with Dr. Watt Climes, on-call for gastroenterology Discussed with hospitalist and will admit to step down  Risk Prescription drug management. Decision regarding hospitalization.           Final Clinical Impression(s) / ED Diagnoses Final diagnoses:  None    Rx / DC Orders ED Discharge Orders     None         Pattricia Boss, MD 07/06/21 1625

## 2021-07-05 NOTE — H&P (Signed)
History and Physical    Stephen Lynch MVH:[id number] DOB: 11/28/1945 DOA: 07/05/2021  PCP: [facility]  Patient coming from: home  I have personally briefly reviewed patient's old medical records in [place]  Chief Complaint: Bloody stools  HPI: Stephen Lynch is Lynch 76 y.o. male with medical history significant of stage IV prostate cancer with metastases to bone, insulin-dependent diabetes, dyslipidemia, presents to the hospital with several episodes of melena.  Patient lives at home with his fiance.  She reports that he was in his usual state of health when yesterday began having diffuse abdominal pain.  He did have Lynch bowel movement yesterday, but she is unaware as to the appearance of the stool.  He is not able to remember appearance of stool either.  She notes that today, he had several episodes of dark melena.  No bright red blood reported in stool.  He was feeling increasingly weak.  She also notes that he he was having dry heaving/vomiting for the past 2 days.  Vomitus included dark-colored material.  No fresh blood.  She also noted that he complained of shortness of breath today.  He is felt feverish/chills.  He does not take any NSAIDs.  ED Course: In the emergency room, he is noted to be tachycardic with Lynch heart rate in the low 110s.  He is afebrile.  Blood pressures currently stable.  Stool occult blood noted to be positive, ER note describes as rectal exam shows melanotic stool.  Hemoglobin noted to be 5.6, down from 10.7 on 5/16.  WBC count of 48,000.  Creatinine 1.43.  INR of 2.1.  Albumin of 2.0.  Serum bicarb of 12.  Troponin mildly elevated at 47 and 50.  EKG without acute ischemic changes.  He was ordered 2 units of PRBC and started on octreotide infusion.  GI consulted.  Review of Systems: As per HPI otherwise 10 point review of systems negative.    Past Medical History:  Diagnosis Date   Cancer [facility])    prostate   Diabetes mellitus without complication  (Stephen Lynch)    Dyslipidemia    Hypertension     Past Surgical History:  Procedure Laterality Date   EYE SURGERY     IR IMAGING GUIDED PORT INSERTION  06/12/2020    Social History:  reports that he has quit smoking. His smoking use included cigarettes. He has Lynch 7.50 pack-year smoking history. He has never used smokeless tobacco. He reports that he does not currently use alcohol. He reports that he does not currently use drugs.  Allergies  Allergen Reactions   Chlorhexidine     Unknown reaction    Family History  Problem Relation Age of Onset   CAD Mother 82   Other Father 31       age   Other Brother 13       unknown cause     Prior to Admission medications   Medication Sig Start Date End Date Taking? Authorizing Provider  acetaminophen (TYLENOL) 500 MG tablet Take 1,000 mg by mouth daily as needed for moderate pain.    [provider]  ASPIRIN 81 PO Take 81 mg by mouth daily.     [provider]  Calcium-Magnesium-Zinc [phone] MG TABS Take 1 tablet by mouth at bedtime.    [provider]  Continuous Blood Gluc Sensor (FREESTYLE LIBRE 2 SENSOR) MISC APPLY EVERY 14 DAYS, CHECK GLUCOSE BEFORE EACH MEAL, 2 HRS AFTER, IN THE MORNING AND BEFORE BEDTIME 02/26/21  Stephen Snare, MD  famotidine (PEPCID) 20 MG tablet Take 20 mg by mouth daily. 05/22/21   [provider]  ferrous sulfate 325 (65 FE) MG tablet Take 1 tablet (325 mg total) by mouth daily. 06/02/21   Regalado, Belkys A, MD  HUMALOG KWIKPEN 100 UNIT/ML KwikPen Inject 3-11 Units into the skin in the morning, at noon, and at bedtime. CBG 70 - 120: 0 units  CBG 121 - 150: 3 units  CBG 151 - 200: 4 units  CBG 201 - 250: 7 units  CBG 251 - 300: 11 units   CBG more than 350 call PCP 06/01/21   Regalado, Belkys A, MD  lidocaine (LIDODERM) 5 % Place 2 patches onto the skin daily. Remove & Discard patch within 12 hours or as directed by MD 03/26/19   Stephen Lipps, MD  lidocaine-prilocaine (EMLA) cream  Apply 1 application topically as needed. 03/26/21   Stephen Portela, MD  loperamide (IMODIUM) 1 MG/5ML solution Take 2 mg by mouth daily as needed for diarrhea or loose stools.    [provider]  magnesium 30 MG tablet Take 30 mg by mouth daily.    [provider]  megestrol (MEGACE ES) 625 MG/5ML suspension Take 5 mLs (625 mg total) by mouth daily. 06/27/21   Stephen Portela, MD  megestrol (MEGACE) 400 MG/10ML suspension Take 10 mLs (400 mg total) by mouth 2 (two) times daily. 06/01/21   Regalado, Belkys A, MD  Menthol-Camphor (TIGER BALM ARTHRITIS RUB EX) Apply 1 application topically 2 (two) times daily as needed (back pain).    [provider]  Nutritional Supplements (,FEEDING SUPPLEMENT, PROSOURCE PLUS) liquid Take 30 mLs by mouth 2 (two) times daily between meals. 06/01/21   Regalado, Belkys A, MD  nystatin (MYCOSTATIN) 100000 UNIT/ML suspension Take 5 mLs (500,000 Units total) by mouth 4 (four) times daily. 06/01/21   Regalado, Belkys A, MD  ondansetron (ZOFRAN) 4 MG tablet Take 1 tablet (4 mg total) by mouth every 6 (six) hours as needed. for nausea 05/03/21   Stephen Portela, MD  oxyCODONE (OXY IR/ROXICODONE) 5 MG immediate release tablet Take 2 tablets (10 mg total) by mouth every 4 (four) hours as needed for severe pain. 07/03/21   Stephen Portela, MD  Oxycodone HCl 10 MG TABS Take 1 tablet (10 mg total) by mouth every 4 (four) hours as needed. 06/27/21   Stephen Portela, MD  prochlorperazine (COMPAZINE) 10 MG tablet Take 1 tablet (10 mg total) by mouth every 6 (six) hours as needed. 03/26/21 05/27/22  Stephen Portela, MD  rosuvastatin (CRESTOR) 10 MG tablet Take 1 tablet (10 mg total) by mouth daily. Patient taking differently: Take 10 mg by mouth every evening. 09/25/16   Stephen Haber, MD  senna-docusate (SENOKOT-S) 8.6-50 MG tablet Take 1 tablet by mouth daily. 06/02/21   Regalado, Belkys A, MD  TRESIBA FLEXTOUCH 100 UNIT/ML FlexTouch Pen Inject 20 Units into the  skin at bedtime. 06/01/21   Stephen Shiley, MD    Physical Exam: Vitals:   07/05/21 1430 07/05/21 1438 07/05/21 1500 07/05/21 1620  BP: 123/65 115/60 132/64 (!) 153/71  Pulse:  (!) 112 (!) 113 (!) 118  Resp: (!) 27 (!) 28 (!) 29 (!) 23  Temp:      TempSrc:      SpO2:  95% 95% 97%  Weight:    87.5 kg  Height:    '6\' 1"'$  (1.854 m)    Constitutional: laying in bed,  awake, shivering, cachectic  Eyes: PERRL, lids and conjunctivae normal ENMT: Mucous membranes are dry. Posterior pharynx clear of any exudate or lesions.Normal dentition.  Neck: normal, supple, no masses, no thyromegaly Respiratory: clear to auscultation bilaterally, no wheezing, no crackles. Normal respiratory effort. No accessory muscle use.  Cardiovascular: Regular rate and rhythm, no murmurs / rubs / gallops. 1+ edema in LE bilaterally. 2+ pedal pulses. No carotid bruits.  Abdomen: no tenderness, no masses palpated. No hepatosplenomegaly. Bowel sounds positive.  Musculoskeletal: no clubbing / cyanosis. No joint deformity upper and lower extremities. Good ROM, no contractures. Normal muscle tone.  Skin: no rashes, lesions, ulcers. No induration Neurologic: CN 2-12 grossly intact. Sensation intact, DTR normal. Strength 5/5 in all 4.  Psychiatric: Normal judgment and insight. Alert and oriented x 3. Normal mood.    Labs on Admission: I have personally reviewed following labs and imaging studies  CBC: Recent Labs  Lab 07/05/21 1229 07/05/21 1236  WBC 48.2*  --   NEUTROABS 35.7*  --   HGB 5.6* 6.8*  HCT 18.2* 20.0*  MCV 102.2*  --   PLT 189  --    Basic Metabolic Panel: Recent Labs  Lab 07/05/21 1236 07/05/21 1413  NA 142 144  K 4.2 4.3  CL 112* 115*  CO2  --  12*  GLUCOSE 193* 177*  BUN 29* 34*  CREATININE 1.20 1.43*  CALCIUM  --  8.1*   GFR: Estimated Creatinine Clearance: 51.2 mL/min (Lynch) (by C-G formula based on SCr of 1.43 mg/dL (H)). Liver Function Tests: Recent Labs  Lab 07/05/21 1413  AST  55*  ALT 23  ALKPHOS 379*  BILITOT 1.0  PROT 4.5*  ALBUMIN 2.0*   No results for input(s): LIPASE, AMYLASE in the last 168 hours. Recent Labs  Lab 07/05/21 1229  AMMONIA 73*   Coagulation Profile: Recent Labs  Lab 07/05/21 1229  INR 2.1*   Cardiac Enzymes: No results for input(s): CKTOTAL, CKMB, CKMBINDEX, TROPONINI in the last 168 hours. BNP (last 3 results) No results for input(s): PROBNP in the last 8760 hours. HbA1C: No results for input(s): HGBA1C in the last 72 hours. CBG: No results for input(s): GLUCAP in the last 168 hours. Lipid Profile: No results for input(s): CHOL, HDL, LDLCALC, TRIG, CHOLHDL, LDLDIRECT in the last 72 hours. Thyroid Function Tests: No results for input(s): TSH, T4TOTAL, FREET4, T3FREE, THYROIDAB in the last 72 hours. Anemia Panel: No results for input(s): VITAMINB12, FOLATE, FERRITIN, TIBC, IRON, RETICCTPCT in the last 72 hours. Urine analysis:    Component Value Date/Time   COLORURINE YELLOW 05/26/2021 0253   APPEARANCEUR CLOUDY (Lynch) 05/26/2021 0253   LABSPEC 1.017 05/26/2021 0253   PHURINE 5.0 05/26/2021 0253   GLUCOSEU NEGATIVE 05/26/2021 0253   HGBUR LARGE (Lynch) 05/26/2021 0253   BILIRUBINUR NEGATIVE 05/26/2021 0253   KETONESUR 5 (Lynch) 05/26/2021 0253   PROTEINUR 30 (Lynch) 05/26/2021 0253   NITRITE POSITIVE (Lynch) 05/26/2021 0253   LEUKOCYTESUR LARGE (Lynch) 05/26/2021 0253    Radiological Exams on Admission: DG Chest Port 1 View  Result Date: 07/05/2021 CLINICAL DATA:  Shortness of breath. EXAM: PORTABLE CHEST 1 VIEW COMPARISON:  May 25, 2021. FINDINGS: The heart size and mediastinal contours are within normal limits. Both lungs are clear. Right internal jugular Port-Lynch-Cath is unchanged in position. Stable diffuse sclerosis of visualized skeleton is noted consistent with metastatic disease. IMPRESSION: No acute cardiopulmonary abnormality is noted. Stable diffuse sclerosis of visualized skeleton consistent with metastatic disease.  Electronically Signed   By:  Marijo Conception M.D.   On: 07/05/2021 12:18    EKG: Independently reviewed. Sinus tachycardia  Assessment/Plan Principal Problem:   Acute blood loss anemia Active Problems:   Dyslipidemia   Diabetes mellitus without complication (HCC)   Goals of care, counseling/discussion   Uncontrolled type 2 diabetes mellitus with hyperglycemia, with long-term current use of insulin (HCC)   Malignant neoplasm of prostate metastatic to bone (HCC)   Hypoalbuminemia due to protein-calorie malnutrition (HCC)   AKI (acute kidney injury) ([place])   Melena   Leukocytosis   Metabolic acidosis   Coagulopathy (HCC)     Acute blood loss anemia -Patient's hemoglobin was 10.7 on 5/16 -Admission hemoglobin noted to be 5.6 -2 units PRBC have been ordered -We will follow-up posttransfusion hemoglobin  Melena -GI consulted -Continue with volume resuscitation -No history of NSAIDs -Continue on Protonix and octreotide infusions for now -If continued bleeding, can consider CT angio  Leukocytosis -Suspect this is stress response from rapid GI bleed -Since he appears to be having shivering and is immunocompromised from chemotherapy, will cover with broad-spectrum antibiotics for now -Check blood cultures/UA -Chest x-ray without evidence of pneumonia  Coagulopathy -Etiology is unclear with -No reported history of cirrhosis -Check right upper quadrant ultrasound to evaluate for nodularity -We will give vitamin K/FFP  Acute kidney injury -Likely related to volume depletion/anemia -Continue IV fluids  Normal anion gap metabolic acidosis -Likely related to vomiting/GI losses -Started on bicarbonate infusion  Hypoalbuminemia -Suspect related to poor nutritional status -Request nutrition to follow  Stage IV prostate cancer with mets to bone -Followed by Dr. Alen Blew, current treatment is palliative -Will inform Dr. Alen Blew of admission  Diabetes mellitus type 2,  insulin-dependent -Hold basal insulin until p.o. intake has improved -Cover with sliding scale insulin  Goals of care -Followed by palliative care as an outpatient -I had Lynch lengthy discussion with patient and his fiance, [name] -[name] reports that she is patient's power of attorney and will bring in appropriate paperwork -We discussed his current medical comorbidities and expected prognosis in the event of Lynch cardiopulmonary arrest -After discussion, patient agrees with DNR status at this point. -We will request palliative care follow since further discussions around goals of care and potential transition to hospice if his condition does not improve would be reasonable  DVT prophylaxis: scd  Code Status: DNR  Family Communication: discussed with patient fiance who is his HCPOA  Disposition Plan: pending hospital course, return home if improves  Consults called: GI, Palliative care  Admission status: inpatient stepdown   Kathie Dike MD [facility]   If 7PM-7AM, please contact night-coverage [email or web address]   07/05/2021, 4:45 PM

## 2021-07-05 NOTE — Progress Notes (Signed)
Pharmacy Antibiotic Note  Stephen Lynch is a 76 y.o. male admitted on 07/05/2021 with bloody stools. Patient with history of prostate cancer with metastases to bone.  Pharmacy has been consulted for vancomycin and cefepime dosing for sepsis.  Plan: Vancomycin 2 g followed by 1500 mg IV q24h. Will follow renal function closely as it has been trending up. Follow up with morning labs to adjust as necessary. Cefepime 2 g IV q12h  Follow renal function, cultures and clinical progress for antibiotic dosage adjustments and de-escalation as clinically indicated.  Height: '6\' 1"'$  (185.4 cm) Weight: 87.5 kg (192 lb 14.4 oz) IBW/kg (Calculated) : 79.9  Temp (24hrs), Avg:97.5 F (36.4 C), Min:97.5 F (36.4 C), Max:97.6 F (36.4 C)  Recent Labs  Lab 07/05/21 1229 07/05/21 1236 07/05/21 1413  WBC 48.2*  --   --   CREATININE  --  1.20 1.43*    Estimated Creatinine Clearance: 51.2 mL/min (A) (by C-G formula based on SCr of 1.43 mg/dL (H)).    Allergies  Allergen Reactions   Chlorhexidine     Unknown reaction    Antimicrobials this admission: Cefepime 5/25 >> Vancomycin 5/25 >>  Dose adjustments this admission: N/A  Microbiology results: 5/25 BCx: pending 5/25 UCx: ordered  5/25 MRSA PCR: ordered  Thank you for allowing pharmacy to be a part of this patient's care.  Tawnya Crook, PharmD, BCPS Clinical Pharmacist 07/05/2021 6:05 PM

## 2021-07-05 NOTE — ED Notes (Signed)
Blood bank has blood ready for this patient.

## 2021-07-05 NOTE — Progress Notes (Signed)
Call received from Darrol Jump, NP with [facility]. She saw patient in his home urgently this AM and found him extremely weak, hypotensive, having profuse and brisk GIB with BRB in toilet upon arrival. She has discussed goals of care with patient and his family and he is followed by [facility] also in their community based program. Patient desires full scope medical interventions and treatment. Given his frailty and metastatic prostate cancer he would be a poor candidate for attempting resuscitation in the event that he becomes pulseless or stops breathing. Recommend additional discussion with family and inpatient palliative consultation. Will follow for continuity of care.  Lane Hacker, DO Palliative Medicine

## 2021-07-05 NOTE — Consult Note (Signed)
Reason for Consult: Hematochezia Referring Physician: [facility]  [name] HPI: This is a 76 year old male with a PMH of castration-resistant metastatic prostate cancer, colonic polyps 2020, diverticula, DM, hyperlipidemia, and HTN admitted for complaints of hematochezia.  This morning he started to experience diarrhea and then this was followed by hematochezia.  As a result he presented to the ER and the rectal examination showed maroon stool.  The patient's HGB dropped from 10.7 g/dL (06/26/2021) down to 5.6 g/dL (07/05/2021).  He denied any problems with abdominal pain, nausea, vomiting or hematemesis.  The onset of his symptoms were acute.  The patient was last evaluated in 2021 to discuss the utility of a repeat colonoscopy as he was noted to have a TVA with his colonoscopy.  After consultation with Dr. Alen Blew it was determined that a repeat colonoscopy was not going to be beneficial.  The most recent Oncology note recommends palliative care as his quality of life is poor.  Past Medical History:  Diagnosis Date   Cancer ([place])    prostate   Diabetes mellitus without complication ([place])    Dyslipidemia    Hypertension     Past Surgical History:  Procedure Laterality Date   EYE SURGERY     IR IMAGING GUIDED PORT INSERTION  06/12/2020    Family History  Problem Relation Age of Onset   CAD Mother 19   Other Father 10       age   Other Brother 15       unknown cause    Social History:  reports that he has quit smoking. His smoking use included cigarettes. He has a 7.50 pack-year smoking history. He has never used smokeless tobacco. He reports that he does not currently use alcohol. He reports that he does not currently use drugs.  Allergies:  Allergies  Allergen Reactions   Chlorhexidine     Unknown reaction    Medications: Scheduled:  sodium chloride   Intravenous Once   insulin aspart  0-5 Units Subcutaneous QHS   [START ON 07/06/2021] insulin aspart  0-9 Units  Subcutaneous TID WC   mouth rinse  15 mL Mouth Rinse BID   megestrol  400 mg Oral BID   [START ON 07/09/2021] pantoprazole  40 mg Intravenous Q12H   Continuous:  albumin human     ceFEPime (MAXIPIME) IV     octreotide  (SANDOSTATIN)    IV infusion 50 mcg/hr (07/05/21 1244)   pantoprazole     phytonadione (VITAMIN K) IV     sodium bicarbonate 150 mEq in D5W infusion     vancomycin      Results for orders placed or performed during the hospital encounter of 07/05/21 (from the past 24 hour(s))  POC occult blood, ED Provider will collect     Status: Abnormal   Collection Time: 07/05/21 12:07 PM  Result Value Ref Range   Fecal Occult Bld POSITIVE (A) NEGATIVE  CBC with Differential     Status: Abnormal   Collection Time: 07/05/21 12:29 PM  Result Value Ref Range   WBC 48.2 (H) 4.0 - 10.5 K/uL   RBC 1.78 (L) 4.22 - 5.81 MIL/uL   Hemoglobin 5.6 (LL) 13.0 - 17.0 g/dL   HCT 18.2 (L) 39.0 - 52.0 %   MCV 102.2 (H) 80.0 - 100.0 fL   MCH 31.5 26.0 - 34.0 pg   MCHC 30.8 30.0 - 36.0 g/dL   RDW 27.1 (H) 11.5 - 15.5 %   Platelets 189  150 - 400 K/uL   nRBC 5.1 (H) 0.0 - 0.2 %   Neutrophils Relative % 69 %   Neutro Abs 35.7 (H) 1.7 - 7.7 K/uL   Band Neutrophils 5 %   Lymphocytes Relative 6 %   Lymphs Abs 2.9 0.7 - 4.0 K/uL   Monocytes Relative 4 %   Monocytes Absolute 1.9 (H) 0.1 - 1.0 K/uL   Eosinophils Relative 0 %   Eosinophils Absolute 0.0 0.0 - 0.5 K/uL   Basophils Relative 0 %   Basophils Absolute 0.0 0.0 - 0.1 K/uL   WBC Morphology      MODERATE LEFT SHIFT (>5% METAS AND MYELOS,OCC PRO NOTED)   Metamyelocytes Relative 9 %   Myelocytes 7 %   Abs Immature Granulocytes 7.70 (H) 0.00 - 0.07 K/uL   Polychromasia PRESENT    Target Cells PRESENT   Protime-INR     Status: Abnormal   Collection Time: 07/05/21 12:29 PM  Result Value Ref Range   Prothrombin Time 23.7 (H) 11.4 - 15.2 seconds   INR 2.1 (H) 0.8 - 1.2  Type and screen [place]     Status: None  (Preliminary result)   Collection Time: 07/05/21 12:29 PM  Result Value Ref Range   ABO/RH(D) AB POS    Antibody Screen NEG    Sample Expiration 07/08/2021,2359    Unit Number [id number]    Blood Component Type RED CELLS,LR    Unit division 00    Status of Unit ISSUED    Transfusion Status OK TO TRANSFUSE    Crossmatch Result      Compatible Performed at [facility], [place] [address] [place], [place] [address]    Unit Number [id number]    Blood Component Type RED CELLS,LR    Unit division 00    Status of Unit ALLOCATED    Transfusion Status OK TO TRANSFUSE    Crossmatch Result Compatible    Unit Number [id number]    Blood Component Type RED CELLS,LR    Unit division 00    Status of Unit ALLOCATED    Transfusion Status OK TO TRANSFUSE    Crossmatch Result Compatible    Unit Number [id number]    Blood Component Type RED CELLS,LR    Unit division 00    Status of Unit ALLOCATED    Transfusion Status OK TO TRANSFUSE    Crossmatch Result Compatible   Ammonia     Status: Abnormal   Collection Time: 07/05/21 12:29 PM  Result Value Ref Range   Ammonia 73 (H) 9 - 35 umol/L  Troponin I (High Sensitivity)     Status: Abnormal   Collection Time: 07/05/21 12:29 PM  Result Value Ref Range   Troponin I (High Sensitivity) 47 (H) <18 ng/L  I-Stat Chem 8, ED     Status: Abnormal   Collection Time: 07/05/21 12:36 PM  Result Value Ref Range   Sodium 142 135 - 145 mmol/L   Potassium 4.2 3.5 - 5.1 mmol/L   Chloride 112 (H) 98 - 111 mmol/L   BUN 29 (H) 8 - 23 mg/dL   Creatinine, Ser 1.20 0.61 - 1.24 mg/dL   Glucose, Bld 193 (H) 70 - 99 mg/dL   Calcium, Ion 1.15 1.15 - 1.40 mmol/L   TCO2 13 (L) 22 - 32 mmol/L   Hemoglobin 6.8 (LL) 13.0 - 17.0 g/dL   HCT 20.0 (L) 39.0 - 52.0 %   Comment NOTIFIED PHYSICIAN   Prepare RBC (crossmatch)  Status: None   Collection Time: 07/05/21 12:56 PM  Result Value Ref Range   Order Confirmation      ORDER PROCESSED BY  BLOOD BANK Performed at [facility], [place] [address], [place], [place] [address]   Troponin I (High Sensitivity)     Status: Abnormal   Collection Time: 07/05/21  1:58 PM  Result Value Ref Range   Troponin I (High Sensitivity) 50 (H) <18 ng/L  Basic metabolic panel     Status: Abnormal   Collection Time: 07/05/21  2:13 PM  Result Value Ref Range   Sodium 144 135 - 145 mmol/L   Potassium 4.3 3.5 - 5.1 mmol/L   Chloride 115 (H) 98 - 111 mmol/L   CO2 12 (L) 22 - 32 mmol/L   Glucose, Bld 177 (H) 70 - 99 mg/dL   BUN 34 (H) 8 - 23 mg/dL   Creatinine, Ser 1.43 (H) 0.61 - 1.24 mg/dL   Calcium 8.1 (L) 8.9 - 10.3 mg/dL   GFR, Estimated 51 (L) >60 mL/min   Anion gap 17 (H) 5 - 15  Hepatic function panel     Status: Abnormal   Collection Time: 07/05/21  2:13 PM  Result Value Ref Range   Total Protein 4.5 (L) 6.5 - 8.1 g/dL   Albumin 2.0 (L) 3.5 - 5.0 g/dL   AST 55 (H) 15 - 41 U/L   ALT 23 0 - 44 U/L   Alkaline Phosphatase 379 (H) 38 - 126 U/L   Total Bilirubin 1.0 0.3 - 1.2 mg/dL   Bilirubin, Direct 0.3 (H) 0.0 - 0.2 mg/dL   Indirect Bilirubin 0.7 0.3 - 0.9 mg/dL     DG Chest Port 1 View  Result Date: 07/05/2021 CLINICAL DATA:  Shortness of breath. EXAM: PORTABLE CHEST 1 VIEW COMPARISON:  May 25, 2021. FINDINGS: The heart size and mediastinal contours are within normal limits. Both lungs are clear. Right internal jugular Port-A-Cath is unchanged in position. Stable diffuse sclerosis of visualized skeleton is noted consistent with metastatic disease. IMPRESSION: No acute cardiopulmonary abnormality is noted. Stable diffuse sclerosis of visualized skeleton consistent with metastatic disease. Electronically Signed   By: Marijo Conception M.D.   On: 07/05/2021 12:18    ROS:  As stated above in the HPI otherwise negative.  Blood pressure (!) 153/71, pulse (!) 118, temperature (!) 97.5 F (36.4 C), resp. rate (!) 23, height '6\' 1"'$  (1.854 m), weight 87.5 kg, SpO2 97 %.     PE: Gen: NAD, Alert and Oriented HEENT:  [place]/AT, EOMI Neck: Supple, no LAD Lungs: CTA Bilaterally CV: RRR without M/G/R ABD: Soft, NTND, +BS Ext: No C/C/E  Assessment/Plan: 1) Maroon stool. 2) History of diverticula. 3) Anemia.   The patient is currently stable and he is receiving blood transfusions.  He is very frail and weak.  An EGD will be performed to exclude an upper GI source.  If the EGD is negative the presumption will be that he has a diverticular bleed.  The patient is too frail to tolerate a prep and undergo the colonoscopy.  Plan: 1) EGD tomorrow. 2) Agree with blood transfusions. 3) Continue with pantoprazole.  Maryellen Dowdle D 07/05/2021, 4:44 PM

## 2021-07-05 NOTE — Progress Notes (Signed)
Hospital computer chart reviewed and case discussed with Dr. Jeanell Sparrow and he has seen Dr. Carol Ada in the office in the past and I have sent them an epic chat and a text asking them to see the patient but I have not heard back yet and in reviewing his chart his INR is up and if ongoing bleeding will need some FFP or vitamin K and if significant bleeding continues consider CT angiogram or nuclear bleeding scan otherwise agree with octreotide and Protonix for now and please call us or Dr. Benson Norway if significant bleeding recurs

## 2021-07-06 ENCOUNTER — Encounter (HOSPITAL_COMMUNITY): Payer: Self-pay | Admitting: Anesthesiology

## 2021-07-06 ENCOUNTER — Encounter (HOSPITAL_COMMUNITY)
Admission: EM | Disposition: A | Discharge status: Home / Self Care | Payer: Self-pay | Source: Home / Self Care | Attending: Internal Medicine

## 2021-07-06 ENCOUNTER — Encounter (HOSPITAL_COMMUNITY): Payer: Self-pay | Admitting: Internal Medicine

## 2021-07-06 ENCOUNTER — Inpatient Hospital Stay (HOSPITAL_COMMUNITY): Payer: Medicare Other

## 2021-07-06 DIAGNOSIS — D689 Coagulation defect, unspecified: Secondary | ICD-10-CM | POA: Diagnosis not present

## 2021-07-06 DIAGNOSIS — E119 Type 2 diabetes mellitus without complications: Secondary | ICD-10-CM | POA: Diagnosis not present

## 2021-07-06 DIAGNOSIS — D62 Acute posthemorrhagic anemia: Secondary | ICD-10-CM | POA: Diagnosis not present

## 2021-07-06 DIAGNOSIS — C787 Secondary malignant neoplasm of liver and intrahepatic bile duct: Secondary | ICD-10-CM

## 2021-07-06 DIAGNOSIS — N179 Acute kidney failure, unspecified: Secondary | ICD-10-CM

## 2021-07-06 HISTORY — PX: SCLEROTHERAPY: SHX6841

## 2021-07-06 HISTORY — PX: HOT HEMOSTASIS: SHX5433

## 2021-07-06 HISTORY — PX: ESOPHAGOGASTRODUODENOSCOPY (EGD) WITH PROPOFOL: SHX5813

## 2021-07-06 HISTORY — PX: HEMOSTASIS CLIP PLACEMENT: SHX6857

## 2021-07-06 LAB — AMMONIA: Ammonia: 58 umol/L — ABNORMAL HIGH (ref 9–35)

## 2021-07-06 LAB — CBC
HCT: 10.4 % — ABNORMAL LOW (ref 39.0–52.0)
HCT: 18.5 % — ABNORMAL LOW (ref 39.0–52.0)
HCT: 27.3 % — ABNORMAL LOW (ref 39.0–52.0)
Hemoglobin: 3.3 g/dL — CL (ref 13.0–17.0)
Hemoglobin: 6.3 g/dL — CL (ref 13.0–17.0)
Hemoglobin: 9.4 g/dL — ABNORMAL LOW (ref 13.0–17.0)
MCH: 28 pg (ref 26.0–34.0)
MCH: 30 pg (ref 26.0–34.0)
MCH: 30.4 pg (ref 26.0–34.0)
MCHC: 31.7 g/dL (ref 30.0–36.0)
MCHC: 34.1 g/dL (ref 30.0–36.0)
MCHC: 34.4 g/dL (ref 30.0–36.0)
MCV: 88.1 fL (ref 80.0–100.0)
MCV: 88.1 fL (ref 80.0–100.0)
MCV: 88.3 fL (ref 80.0–100.0)
Platelets: 103 10*3/uL — ABNORMAL LOW (ref 150–400)
Platelets: 97 10*3/uL — ABNORMAL LOW (ref 150–400)
Platelets: 90 10*3/uL — ABNORMAL LOW (ref 150–400)
RBC: 1.18 MIL/uL — ABNORMAL LOW (ref 4.22–5.81)
RBC: 2.1 MIL/uL — ABNORMAL LOW (ref 4.22–5.81)
RBC: 3.09 MIL/uL — ABNORMAL LOW (ref 4.22–5.81)
RDW: 23.9 % — ABNORMAL HIGH (ref 11.5–15.5)
RDW: 17.9 % — ABNORMAL HIGH (ref 11.5–15.5)
RDW: 17 % — ABNORMAL HIGH (ref 11.5–15.5)
WBC: 22.7 10*3/uL — ABNORMAL HIGH (ref 4.0–10.5)
WBC: 25.9 10*3/uL — ABNORMAL HIGH (ref 4.0–10.5)
WBC: 29.3 10*3/uL — ABNORMAL HIGH (ref 4.0–10.5)
nRBC: 5.8 % — ABNORMAL HIGH (ref 0.0–0.2)
nRBC: 5.3 % — ABNORMAL HIGH (ref 0.0–0.2)
nRBC: 7.4 % — ABNORMAL HIGH (ref 0.0–0.2)

## 2021-07-06 LAB — GLUCOSE, CAPILLARY
Glucose-Capillary: 170 mg/dL — ABNORMAL HIGH (ref 70–99)
Glucose-Capillary: 155 mg/dL — ABNORMAL HIGH (ref 70–99)
Glucose-Capillary: 156 mg/dL — ABNORMAL HIGH (ref 70–99)
Glucose-Capillary: 150 mg/dL — ABNORMAL HIGH (ref 70–99)

## 2021-07-06 LAB — HEPATIC FUNCTION PANEL
ALT: 34 U/L (ref 0–44)
AST: 132 U/L — ABNORMAL HIGH (ref 15–41)
Albumin: 2.5 g/dL — ABNORMAL LOW (ref 3.5–5.0)
Alkaline Phosphatase: 284 U/L — ABNORMAL HIGH (ref 38–126)
Bilirubin, Direct: 0.3 mg/dL — ABNORMAL HIGH (ref 0.0–0.2)
Indirect Bilirubin: 0.7 mg/dL (ref 0.3–0.9)
Total Bilirubin: 1 mg/dL (ref 0.3–1.2)
Total Protein: 4.5 g/dL — ABNORMAL LOW (ref 6.5–8.1)

## 2021-07-06 LAB — BASIC METABOLIC PANEL
Anion gap: 6 (ref 5–15)
BUN: 37 mg/dL — ABNORMAL HIGH (ref 8–23)
CO2: 22 mmol/L (ref 22–32)
Calcium: 7.4 mg/dL — ABNORMAL LOW (ref 8.9–10.3)
Chloride: 116 mmol/L — ABNORMAL HIGH (ref 98–111)
Creatinine, Ser: 1.17 mg/dL (ref 0.61–1.24)
GFR, Estimated: >60 mL/min (ref >60)
Glucose, Bld: 185 mg/dL — ABNORMAL HIGH (ref 70–99)
Potassium: 3.9 mmol/L (ref 3.5–5.1)
Sodium: 144 mmol/L (ref 135–145)

## 2021-07-06 LAB — URINE CULTURE: Culture: <10000 — AB

## 2021-07-06 LAB — PROTIME-INR
INR: 1.8 — ABNORMAL HIGH (ref 0.8–1.2)
Prothrombin Time: 21 seconds — ABNORMAL HIGH (ref 11.4–15.2)

## 2021-07-06 LAB — PREPARE RBC (CROSSMATCH)

## 2021-07-06 SURGERY — ESOPHAGOGASTRODUODENOSCOPY (EGD) WITH PROPOFOL
Anesthesia: Monitor Anesthesia Care

## 2021-07-06 MED ORDER — SODIUM CHLORIDE (PF) 0.9 % IJ SOLN
PREFILLED_SYRINGE | INTRAMUSCULAR | Status: DC | PRN
  Administered 2021-07-06: 1 mL
  Administered 2021-07-06: 2 mL

## 2021-07-06 MED ORDER — VANCOMYCIN HCL 1750 MG/350ML IV SOLN
1750.0000 mg | INTRAVENOUS | Status: DC
  Administered 2021-07-06 – 2021-07-07 (×2): 1750 mg via INTRAVENOUS
  Filled 2021-07-06 (×2): qty 350

## 2021-07-06 MED ORDER — ADULT MULTIVITAMIN W/MINERALS CH
1.0000 | ORAL_TABLET | Freq: Every day | ORAL | Status: DC
  Administered 2021-07-07 – 2021-07-10 (×4): 1 via ORAL
  Filled 2021-07-06 (×4): qty 1

## 2021-07-06 MED ORDER — SUCRALFATE 1 GM/10ML PO SUSP
1.0000 g | Freq: Three times a day (TID) | ORAL | Status: DC
  Administered 2021-07-06 – 2021-07-10 (×16): 1 g via ORAL
  Filled 2021-07-06 (×16): qty 10

## 2021-07-06 MED ORDER — FENTANYL CITRATE (PF) 100 MCG/2ML IJ SOLN
INTRAMUSCULAR | Status: DC | PRN
Start: 2021-07-06 — End: 2021-07-06
  Administered 2021-07-06: 25 ug via INTRAVENOUS
  Administered 2021-07-06: 50 ug via INTRAVENOUS

## 2021-07-06 MED ORDER — SODIUM CHLORIDE 0.9% FLUSH
10.0000 mL | Freq: Two times a day (BID) | INTRAVENOUS | Status: DC
  Administered 2021-07-06 – 2021-07-10 (×6): 10 mL

## 2021-07-06 MED ORDER — HYDROMORPHONE HCL 1 MG/ML IJ SOLN: 1.0000 mg | INTRAMUSCULAR | Status: DC | PRN

## 2021-07-06 MED ORDER — SIMETHICONE 40 MG/0.6ML PO SUSP
ORAL | Status: AC
  Filled 2021-07-06: qty 0.6

## 2021-07-06 MED ORDER — MEGESTROL ACETATE 625 MG/5ML PO SUSP: 625.0000 mg | Freq: Every day | ORAL | Status: DC

## 2021-07-06 MED ORDER — EPINEPHRINE 1 MG/10ML IJ SOSY
PREFILLED_SYRINGE | INTRAMUSCULAR | Status: AC
  Filled 2021-07-06: qty 10

## 2021-07-06 MED ORDER — FUROSEMIDE 10 MG/ML IJ SOLN
20.0000 mg | Freq: Once | INTRAMUSCULAR | Status: AC
  Administered 2021-07-06: 20 mg via INTRAVENOUS
  Filled 2021-07-06: qty 2

## 2021-07-06 MED ORDER — FENTANYL CITRATE (PF) 100 MCG/2ML IJ SOLN
INTRAMUSCULAR | Status: AC
Start: 2021-07-06 — End: ?
  Filled 2021-07-06: qty 2

## 2021-07-06 MED ORDER — MIDAZOLAM HCL (PF) 5 MG/ML IJ SOLN
INTRAMUSCULAR | Status: AC
  Filled 2021-07-06: qty 1

## 2021-07-06 MED ORDER — BOOST / RESOURCE BREEZE PO LIQD CUSTOM
1.0000 | Freq: Two times a day (BID) | ORAL | Status: DC
  Administered 2021-07-07 – 2021-07-10 (×6): 1 via ORAL

## 2021-07-06 MED ORDER — IOHEXOL 350 MG/ML SOLN
100.0000 mL | Freq: Once | INTRAVENOUS | Status: AC | PRN
  Administered 2021-07-06: 100 mL via INTRAVENOUS

## 2021-07-06 MED ORDER — SODIUM CHLORIDE 0.9% IV SOLUTION: Freq: Once | INTRAVENOUS | Status: AC

## 2021-07-06 MED ORDER — ENSURE ENLIVE PO LIQD: 237.0000 mL | ORAL | Status: DC

## 2021-07-06 MED ORDER — MIDAZOLAM HCL (PF) 5 MG/ML IJ SOLN
INTRAMUSCULAR | Status: DC | PRN
  Administered 2021-07-06 (×3): 1 mg via INTRAVENOUS

## 2021-07-06 MED ORDER — SODIUM CHLORIDE 0.9 % IV SOLN
2.0000 g | Freq: Three times a day (TID) | INTRAVENOUS | Status: DC
  Administered 2021-07-06 – 2021-07-08 (×7): 2 g via INTRAVENOUS
  Filled 2021-07-06 (×7): qty 12.5

## 2021-07-06 MED ORDER — SODIUM CHLORIDE (PF) 0.9 % IJ SOLN
INTRAMUSCULAR | Status: AC
Start: 2021-07-06 — End: 2021-07-06
  Filled 2021-07-06: qty 50

## 2021-07-06 SURGICAL SUPPLY — 15 items

## 2021-07-06 NOTE — Progress Notes (Signed)
PROGRESS NOTE    Stephen Lynch  HCW:[id number] DOB: 1945/06/21 DOA: 07/05/2021 PCP: [facility]    Brief Narrative:  76 y/o male with history of stage 4 prostate CA on chemo, IDDM, admitted to the hospital with melena, acute blood loss anemia, AKI and metabolic acidosis. He was also noted to be coagulopathic with INR of 2.1, no reported history of cirrhosis. Baseline hemoglobin noted to be 10.7 on 5/16, admission hemoglobin 5.6. He received 2 unit prbc and FFP and follow up hemoglobin 3.3. He is being transfused another 4 unit prbc. GI following. Plans are for CT angio of abdomen today.   Assessment & Plan:   Principal Problem:   Acute blood loss anemia Active Problems:   Dyslipidemia   Diabetes mellitus without complication (HCC)   Goals of care, counseling/discussion   Uncontrolled type 2 diabetes mellitus with hyperglycemia, with long-term current use of insulin (HCC)   Malignant neoplasm of prostate metastatic to bone (HCC)   Hypoalbuminemia due to protein-calorie malnutrition (HCC)   AKI (acute kidney injury) ([place])   Melena   Leukocytosis   Metabolic acidosis   Coagulopathy (HCC)   Acute blood loss anemia -Patient's hemoglobin was 10.7 on 5/16 -Admission hemoglobin noted to be 5.6 -2 units PRBC were transfused -follow up hemoglobin 3.3 (confirmed on recheck per staff) -another 4 unit prbc ordered today -Continue to follow hemoglobin   Melena -GI following, discussed with Dr. Benson Norway -Continue with volume resuscitation -No history of NSAIDs -Continue on Protonix infusion -no history of cirrhosis/varices, will discontinue octreotide -plans were for EGD today, but with ongoing bleeding and worsening anemia, not felt to be stable enough for anesthesia -CTA abdomen/pelvis ordered to evaluate for active bleeding. If active bleeding identified, will consult IR for possible embolization   Leukocytosis -Suspect this is stress response from rapid GI  bleed -Since he appears to be having shivering and is immunocompromised from chemotherapy, will cover with broad-spectrum antibiotics for now -urine and blood cultures in process -Chest x-ray without evidence of pneumonia -WBC 48.2 -> 22.7   Coagulopathy -Etiology is unclear  -Admission INR 2.1 -No reported history of cirrhosis -RUQ ultrasound did not show liver changes consistent with cirrhosis, portal vein was patent -Received vitamin K/FFP 5/25 -current INR 1.8   Acute kidney injury -Likely related to volume depletion/anemia -creatinine 1.4 on admission -creatinine improved to 1.1 with IV fluids -Continue volume resuscitation   Normal anion gap metabolic acidosis -Likely related to vomiting/GI losses -improved with bicarbonate infusion   Hypoalbuminemia -Suspect related to poor nutritional status -Request nutrition to follow   Stage IV prostate cancer with mets to bone -Followed by Dr. Alen Blew, current treatment is palliative -Dr. Alen Blew added to treatment team.   Diabetes mellitus type 2, insulin-dependent -Hold basal insulin until p.o. intake has improved -Cover with sliding scale insulin   Goals of care -Followed by palliative care as an outpatient -I had a lengthy discussion with patient and his fiance, [name] -[name] reports that she is patient's power of attorney and will bring in appropriate paperwork -We discussed his current medical comorbidities and expected prognosis in the event of a cardiopulmonary arrest -After discussion, patient agrees with DNR status at this point. -We will request palliative care follow since further discussions around goals of care and potential transition to hospice if his condition does not improve would be reasonable   DVT prophylaxis: SCDs Start: 07/05/21 1631  Code Status: DNR Family Communication: updated patient fiance Ms. Milas Gain Disposition Plan:  Status is: Inpatient Remains inpatient appropriate because:  continued management/work up of GI bleeding and associated anemia     Consultants:  GI Palliative care  Procedures:    Antimicrobials:  Vancomycin 5/25> Cefepime 5/25>    Subjective: Denies any abdominal pain. Staff reports 2 melanotic stools overnight and one this morning.   Objective: Vitals:   07/06/21 0720 07/06/21 0800 07/06/21 0820 07/06/21 0838  BP:  (!) 114/47 (!) 134/52 (!) 148/48  Pulse: (!) 113 (!) 114 (!) 118 (!) 112  Resp: (!) 22 15 (!) 23 (!) 27  Temp:   97.8 F (36.6 C) 98.7 F (37.1 C)  TempSrc:   Oral Oral  SpO2: 100% 99% 100% 96%  Weight:      Height:        Intake/Output Summary (Last 24 hours) at 07/06/2021 9381 Last data filed at 07/06/2021 0700 Gross per 24 hour  Intake 3805.37 ml  Output 600 ml  Net 3205.37 ml   Filed Weights   07/05/21 1620  Weight: 87.5 kg    Examination:  General exam: Appears calm and comfortable  Respiratory system: Clear to auscultation. Respiratory effort normal. Cardiovascular system: S1 & S2 heard, RRR. No JVD, murmurs, rubs, gallops or clicks. No pedal edema. Gastrointestinal system: Abdomen is nondistended, soft and nontender. No organomegaly or masses felt. Normal bowel sounds heard. Central nervous system: Alert and oriented. No focal neurological deficits. Extremities: Symmetric 5 x 5 power. Skin: No rashes, lesions or ulcers Psychiatry: Judgement and insight appear normal. Mood & affect appropriate.     Data Reviewed: I have personally reviewed following labs and imaging studies  CBC: Recent Labs  Lab 07/05/21 1229 07/05/21 1236 07/06/21 0726  WBC 48.2*  --  22.7*  NEUTROABS 35.7*  --   --   HGB 5.6* 6.8* 3.3*  HCT 18.2* 20.0* 10.4*  MCV 102.2*  --  88.1  PLT 189  --  829*   Basic Metabolic Panel: Recent Labs  Lab 07/05/21 1236 07/05/21 1413 07/06/21 0635  NA 142 144 144  K 4.2 4.3 3.9  CL 112* 115* 116*  CO2  --  12* 22  GLUCOSE 193* 177* 185*  BUN 29* 34* 37*  CREATININE 1.20  1.43* 1.17  CALCIUM  --  8.1* 7.4*   GFR: Estimated Creatinine Clearance: 62.6 mL/min (by C-G formula based on SCr of 1.17 mg/dL). Liver Function Tests: Recent Labs  Lab 07/05/21 1413 07/06/21 0815  AST 55* 132*  ALT 23 34  ALKPHOS 379* 284*  BILITOT 1.0 1.0  PROT 4.5* 4.5*  ALBUMIN 2.0* 2.5*   No results for input(s): LIPASE, AMYLASE in the last 168 hours. Recent Labs  Lab 07/05/21 1229 07/06/21 0815  AMMONIA 73* 58*   Coagulation Profile: Recent Labs  Lab 07/05/21 1229 07/06/21 0635  INR 2.1* 1.8*   Cardiac Enzymes: No results for input(s): CKTOTAL, CKMB, CKMBINDEX, TROPONINI in the last 168 hours. BNP (last 3 results) No results for input(s): PROBNP in the last 8760 hours. HbA1C: No results for input(s): HGBA1C in the last 72 hours. CBG: Recent Labs  Lab 07/05/21 1805 07/05/21 2148 07/06/21 0743  GLUCAP 163* 222* 170*   Lipid Profile: No results for input(s): CHOL, HDL, LDLCALC, TRIG, CHOLHDL, LDLDIRECT in the last 72 hours. Thyroid Function Tests: No results for input(s): TSH, T4TOTAL, FREET4, T3FREE, THYROIDAB in the last 72 hours. Anemia Panel: No results for input(s): VITAMINB12, FOLATE, FERRITIN, TIBC, IRON, RETICCTPCT in the last 72 hours. Sepsis Labs: No results for  input(s): PROCALCITON, LATICACIDVEN in the last 168 hours.  Recent Results (from the past 240 hour(s))  Culture, blood (Routine X 2) w Reflex to ID Panel     Status: None (Preliminary result)   Collection Time: 07/05/21  5:05 PM   Specimen: BLOOD  Result Value Ref Range Status   Specimen Description   Final    BLOOD RIGHT ANTECUBITAL Performed at [facility] [address] [place], [place] [address]    Special Requests   Final    IN PEDIATRIC BOTTLE Blood Culture adequate volume Performed at [facility] [address] [place], [place] [address]    Culture   Final    NO GROWTH < 12 HOURS Performed at [facility]  [address], [place],  [address]    Report Status PENDING  Incomplete         Radiology Studies: DG Chest Port 1 View  Result Date: 07/05/2021 CLINICAL DATA:  Shortness of breath. EXAM: PORTABLE CHEST 1 VIEW COMPARISON:  May 25, 2021. FINDINGS: The heart size and mediastinal contours are within normal limits. Both lungs are clear. Right internal jugular Port-A-Cath is unchanged in position. Stable diffuse sclerosis of visualized skeleton is noted consistent with metastatic disease. IMPRESSION: No acute cardiopulmonary abnormality is noted. Stable diffuse sclerosis of visualized skeleton consistent with metastatic disease. Electronically Signed   By: Marijo Conception M.D.   On: 07/05/2021 12:18   US Abdomen Limited RUQ (LIVER/GB)  Result Date: 07/05/2021 CLINICAL DATA:  Elevated LFTs. EXAM: ULTRASOUND ABDOMEN LIMITED RIGHT UPPER QUADRANT COMPARISON:  PET-CT 11/14/2020. CT abdomen and pelvis 03/23/2019. FINDINGS: Gallbladder: Not visualized.  No sonographic Murphy sign noted by sonographer. Common bile duct: Diameter: 8.2 mm. Liver: No focal lesion identified. Parenchymal echogenicity is diffusely increased in diffusely heterogeneous. Portal vein is patent on color Doppler imaging with normal direction of blood flow towards the liver. Other: None. IMPRESSION: 1. The gallbladder is not visualized. The common bile duct is mildly prominent in size measuring up 8 mm. Recommend correlation with lab values. (This has significantly decreased in size compared to 02/09 1,021). 2. Heterogeneous echogenic liver likely related to diffuse hepatocellular disease. Electronically Signed   By: Ronney Asters M.D.   On: 07/05/2021 17:18        Scheduled Meds:  insulin aspart  0-5 Units Subcutaneous QHS   insulin aspart  0-9 Units Subcutaneous TID WC   mouth rinse  15 mL Mouth Rinse BID   [START ON 07/09/2021] pantoprazole  40 mg Intravenous Q12H   Continuous Infusions:  sodium chloride 20 mL/hr at 07/06/21 0700    ceFEPime (MAXIPIME) IV 2 g (07/06/21 0913)   octreotide  (SANDOSTATIN)    IV infusion 50 mcg/hr (07/06/21 0859)   pantoprazole 8 mg/hr (07/06/21 0700)   sodium bicarbonate 150 mEq in D5W infusion 75 mL/hr at 07/06/21 0700   vancomycin       LOS: 1 day    Time spent: 69mns    JKathie Dike MD [facility]   If 7PM-7AM, please contact night-coverage [email or web address]  07/06/2021, 9:22 AM

## 2021-07-06 NOTE — TOC Initial Note (Signed)
Transition of Care [facility]) - Initial/Assessment Note   Patient Details  Name: Stephen Lynch MRN: [id number] Date of Birth: 1945-12-03  Transition of Care [facility]) CM/SW Contact:    Sherie Don, LCSW Phone Number: 07/06/2021, 10:48 AM  Clinical Narrative: Patient is from home with OP palliative. TOC awaiting palliative recommendations.  Expected Discharge Plan: Home/Self Care Barriers to Discharge: Continued Medical Work up  Patient Goals and CMS Choice Choice offered to / list presented to : NA  Expected Discharge Plan and Services Expected Discharge Plan: Home/Self Care In-house Referral: Clinical Social Work Living arrangements for the past 2 months: Single Family Home  Prior Living Arrangements/Services Living arrangements for the past 2 months: Single Family Home Patient language and need for interpreter reviewed:: Yes Need for Family Participation in Patient Care: No (Comment) Care giver support system in place?: Yes (comment) Criminal Activity/Legal Involvement Pertinent to Current Situation/Hospitalization: No - Comment as needed  Activities of Daily Living Home Assistive Devices/Equipment: Walker (specify type), Bedside commode/3-in-1, Eyeglasses (walking stick) ADL Screening (condition at time of admission) Patient's cognitive ability adequate to safely complete daily activities?: No Is the patient deaf or have difficulty hearing?: No Does the patient have difficulty seeing, even when wearing glasses/contacts?: No Does the patient have difficulty concentrating, remembering, or making decisions?: Yes Patient able to express need for assistance with ADLs?: Yes Does the patient have difficulty dressing or bathing?: Yes Independently performs ADLs?: No Communication: Independent Dressing (OT): Needs assistance Is this a change from baseline?: Change from baseline, expected to last >3 days Grooming: Independent Feeding: Independent Bathing: Needs assistance Is this a  change from baseline?: Change from baseline, expected to last >3 days Toileting: Needs assistance Is this a change from baseline?: Change from baseline, expected to last >3days In/Out Bed: Needs assistance Is this a change from baseline?: Change from baseline, expected to last >3 days Walks in Home: Dependent Is this a change from baseline?: Change from baseline, expected to last >3 days Does the patient have difficulty walking or climbing stairs?: Yes Weakness of Legs: Both Weakness of Arms/Hands: Both  Emotional Assessment Orientation: : Oriented to Self, Oriented to Place, Oriented to  Time Alcohol / Substance Use: Not Applicable Psych Involvement: No (comment)  Admission diagnosis:  Acute blood loss anemia [D62] Patient Active Problem List   Diagnosis Date Noted   Acute blood loss anemia 07/05/2021   AKI (acute kidney injury) ([place]) 07/05/2021   Melena 07/05/2021   Leukocytosis 54/00/8676   Metabolic acidosis 19/50/9326   Coagulopathy ([place]) 07/05/2021   Port-A-Cath in place 05/30/2021   Hypoalbuminemia due to protein-calorie malnutrition ([place]) 05/30/2021   Iron deficiency anemia 05/30/2021   Hypokalemia 05/30/2021   Sepsis secondary to UTI ([place]) 05/26/2021   Thrush of mouth and esophagus ([place]) 05/26/2021   Lower extremity edema 05/26/2021   GERD (gastroesophageal reflux disease) 05/26/2021   Malignant neoplasm of prostate metastatic to bone ([place]) 03/23/2021   Uncontrolled type 2 diabetes mellitus with hyperglycemia, with long-term current use of insulin ([place]) 71/24/5809   Complicated UTI (urinary tract infection) 98/33/8250   Acute metabolic encephalopathy 53/97/6734   Goals of care, counseling/discussion 05/26/2020   Prostate cancer ([place]) 04/13/2019   Back pain 03/23/2019   Symptomatic anemia 03/23/2019   COVID-19 virus infection 03/23/2019   Dilation of biliary tract 03/23/2019   COPD (chronic obstructive pulmonary disease) ([place]) 03/23/2019   Hypertension     Dyslipidemia    Diabetes mellitus without complication [facility])    PCP:  [facility] Pharmacy:   [id number] [place] [place]Phone: [phone]: [phone] [place]Phone: [phone]: [phone] [facility] [place] [id number]: [phone]: [phone] Readmission Risk Interventions    07/06/2021   10:45 AM 05/27/2021    2:29 PM  Readmission Risk Prevention Plan  Transportation Screening  Complete  PCP or Specialist Appt within 3-5 Days  Complete  HRI or Home Care Consult  Complete  Medication Review (RN Care Manager) Complete   HRI or [place]. [place]   SW Recovery Care/Counseling Consult Complete   Palliative Care Screening Complete   [place]

## 2021-07-06 NOTE — Consult Note (Signed)
Palliative Care Consult Note  Narrative: Stephen Lynch is a 76 year old gentleman with metastatic prostate cancer admitted to [facility] on 520 06/12/2021 after his home care nurse practitioner from [facility] found him at home weak, hypotensive and having hematochezia.  EMS was called and he was transported to ED, upon arrival had a hemoglobin of 5.6.  Since admission he has continued to have dark bloody bowel movements.  He has been seen by GI and the plan is for transfusion and endoscopy intervention.  He is currently in the ICU, he is able to wake to my voice but is unable to provide detailed information due to weakness and mild confusion.  Denies any pain or other symptoms, he is mostly worried about his condition.  His wife is not at bedside but has been involved in his care and is his primary caregiver at home.  He is followed by Dr. Alen Blew in the cancer center for metastatic prostate cancer.  He has been receiving Taxotere chemotherapy, but his functional status is poor at baseline and he requires quite a bit of assistance for activities of daily living.  He had radiation to L3 and L4 February 2023.  According to documentation by Dr. Alen Blew he has to date had clinical benefit related to chemotherapy, but if he continues to decline in terms of functional status or cancer progression, he offered the alternative option of supportive care and hospice if he is unable to tolerate the chemotherapy. In addition to [facility] he is also seen by [facility].  Exam: Critically ill appearing. Pale. Easily awakens to voice. Able to answer simple questions. Lungs are clear, extremities cool. LE edema. HR regular. Oriented to person and place. No agitation or signs of distress.  Recommendations:  DNR, Full scope Medical Interventions and Treatment outside of being pulseless and not breathing.  Goals: Medical Intervention with goal of restoring best possible QOL, acknowledging that he has prostate  cancer that is not curable and he may not be able to tolerate any additional chemotherapy. Depending on the findings of endoscopy and ability to achieve control of acute blood loss, will re-address goals of care and preferences for discharge resources related to hospice and palliative care services. Patient is on oxycodonee for bone mets pain at home-will add option for IV pain control given NPO status.  Will follow as needed through weekend and meet with patient and his wife when he stabilizes.  Lane Hacker, DO Palliative Medicine  Time: 75 min

## 2021-07-06 NOTE — Progress Notes (Signed)
2 units PRBC administered. Per verbal order from MD, recollect CBC 30 minutes after completion.

## 2021-07-06 NOTE — Progress Notes (Signed)
Initial Nutrition Assessment  DOCUMENTATION CODES:   Not applicable  INTERVENTION:  - diet advancement as medically feasible.  - will order Boost Breeze BID for once diet advanced to at least CLD, each supplement provides 250 kcal and 9 grams of protein.  - will order Ensure Plus High Protein once/day for once diet advanced to at least FLD, each supplement provides 350 kcal and 20 grams of protein.  - will order 1 tablet multivitamin with minerals/day.   - recommend resume home dose of megace if feasible and within [place].   NUTRITION DIAGNOSIS:   Increased nutrient needs related to cancer and cancer related treatments, chronic illness as evidenced by estimated needs.  GOAL:   Patient will meet greater than or equal to 90% of their needs  MONITOR:   Diet advancement, PO intake, Supplement acceptance, Labs, Weight trends  REASON FOR ASSESSMENT:   Malnutrition Screening Tool, Consult Assessment of nutrition requirement/status  ASSESSMENT:   76 y.o. male with medical history of stage IV prostate cancer with metastases to bone, insulin-dependent DM, HTN, and dyslipidemia. He presented to the ED after several episodes of melena, abdominal pain, dry heaving and vomiting x2 days, and increased weakness. He was noted to have shortness of breath x1 day PTA and to feel feverish. He was admitted for acute blood loss anemia.   Patient laying in bed receiving blood transfusion. No visitors present at the time of RD visit. Patient is known to this RD from admission in April.  He was seen by [facility] RD on 06/26/21 during chemo. At that time patient reported poor appetite but being started recently on appetite stimulant. He had indicated that he prefers supplements similar to Boost Breeze (thin, juice-like) but that he does have supplements similar to Ensure (creamy) at home.   Patient's speech a bit slow and quiet throughout visit. He shares that the last time he ate was  yesterday but he is unable to recall what he had. He shares that appetite has greatly improved over the past 1.5-2 weeks and that he feels that appetite stimulant is helping.   Weight yesterday was 193 lb and weight has been stable since 05/16/21. Weight on 05/03/21 was 200 lb which indicates 7 lb weight loss (3.5% body weight) in the past 2 months.   Labs reviewed; CBGs: 170 and 155 mg/dl, Cl: 116 mmol/l, BUN: 37 mg/dl, Ca: 7.4 mg/dl, Alk Phos elevated, AST elevated.   Medications reviewed; sliding scale novolog, 40 mg IV protonix BID, 10 mg IV vitamin K x1 run 5/25.   NUTRITION - FOCUSED PHYSICAL EXAM:  Flowsheet Row Most Recent Value  Orbital Region No depletion  Upper Arm Region Mild depletion  Thoracic and Lumbar Region Unable to assess  Buccal Region No depletion  Temple Region Mild depletion  Clavicle Bone Region No depletion  Clavicle and Acromion Bone Region No depletion  Scapular Bone Region No depletion  Dorsal Hand No depletion  Patellar Region No depletion  Anterior Thigh Region No depletion  Posterior Calf Region No depletion  Edema (RD Assessment) Mild  [BLE]  Hair Reviewed  Eyes Reviewed  Mouth Reviewed  [white patches on tongue]  Skin Reviewed  Nails Reviewed       Diet Order:   Diet Order             Diet NPO time specified  Diet effective now                   EDUCATION  NEEDS:   No education needs have been identified at this time  Skin:  Skin Assessment: Reviewed RN Assessment  Last BM:  5/26 (type 7 x2, large amounts; type 6 x2, large amounts)  Height:   Ht Readings from Last 1 Encounters:  07/05/21 6' 1"  (1.854 m)    Weight:   Wt Readings from Last 1 Encounters:  07/05/21 87.5 kg     BMI:  Body mass index is 25.45 kg/m.  Estimated Nutritional Needs:  Kcal:  2200-2400 kcal Protein:  110-120 grams Fluid:  >/= 2.2 L/day     Jarome Matin, MS, RD, LDN Registered Dietitian II Inpatient Clinical Nutrition RD pager # and  on-call/weekend pager # available in [facility]

## 2021-07-06 NOTE — Progress Notes (Addendum)
Patient had another large tarry/bloody bowel movement, MD notified; per report, patient had 2 occurrences overnight as well. Patient receiving PRBC at this time.

## 2021-07-06 NOTE — Progress Notes (Signed)
CTA results reviewed.  Stat CBC shows that his HGB is 6.3 g/dL.  An emergent EGD will be performed.  This was discussed with the patient and his family.

## 2021-07-06 NOTE — Op Note (Signed)
[facility] Patient Name: Stephen Lynch Procedure Date: 07/06/2021 MRN: [id number] Attending MD: Carol Ada , MD Date of Birth: [birth year] CSN: [id number] Age: 76 Admit Type: Inpatient Procedure:                Upper GI endoscopy Indications:              Hematochezia Providers:                Carol Ada, MD, Doristine Johns, RN, Grace Isaac, RN, Darliss Cheney, Technician, Tyna Jaksch                            Technician Referring MD:              Medicines:                Fentanyl 75 micrograms IV, Midazolam 3 mg IV Complications:            No immediate complications. Estimated Blood Loss:     Estimated blood loss: none. Procedure:                Pre-Anesthesia Assessment:                           - Prior to the procedure, a History and Physical                            was performed, and patient medications and                            allergies were reviewed. The patient's tolerance of                            previous anesthesia was also reviewed. The risks                            and benefits of the procedure and the sedation                            options and risks were discussed with the patient.                            All questions were answered, and informed consent                            was obtained. Prior Anticoagulants: The patient has                            taken no previous anticoagulant or antiplatelet                            agents. ASA Grade Assessment: IV - A patient with  severe systemic disease that is a constant threat                            to life. After reviewing the risks and benefits,                            the patient was deemed in satisfactory condition to                            undergo the procedure.                           - Sedation was administered by an endoscopy nurse.                            The sedation level attained was  moderate.                           After obtaining informed consent, the endoscope was                            passed under direct vision. Throughout the                            procedure, the patient's blood pressure, pulse, and                            oxygen saturations were monitored continuously. The                            [id number]-H190 ([id number] Olympus endoscope was introduced                            through the mouth, and advanced to the second part                            of duodenum. The upper GI endoscopy was                            accomplished without difficulty. The patient                            tolerated the procedure well. Scope In: Scope Out: Findings:      LA Grade D (one or more mucosal breaks involving at least 75% of       esophageal circumference) esophagitis with no bleeding was found in the       lower third of the esophagus.      One oozing cratered gastric ulcer with oozing hemorrhage (Forrest Class       Ib) was found in the prepyloric region of the stomach. The lesion was 25       mm in largest dimension. Area was successfully injected with 3 mL of a       1:10,000 solution of epinephrine for hemostasis. Coagulation for       hemostasis  using bipolar probe was successful. Estimated blood loss:       none. To stop active bleeding, three hemostatic clips were successfully       placed. There was no bleeding at the end of the procedure.      The examined duodenum was normal.      A large clot was noted extending from the antrum to the fundus. The site       of bleeding correlated with the CTA results. An adherent clot was noted       to be hanging off of a visible vessel in the prepyloric region.       Spontaneously most of the clot broke off, which improved visualization       and therapeutics. Three ml of 1:10,000 Epi was injected around the       vessel and the appropriate blanching was noted. This significantly       slowed down the  bleeding. Three 5 second applications of cautery with       BICAP was employed to ablate the vessel. The ulcer bed was still pliable       and three hemoclips were able to be deployed across the vessel. No       bleeding was induced and all the bleeding arrested. Impression:               - LA Grade D reflux esophagitis with no bleeding.                           - Oozing gastric ulcer with oozing hemorrhage                            (Forrest Class Ib). Injected. Treated with bipolar                            cautery. Clips were placed.                           - Normal examined duodenum.                           - No specimens collected. Moderate Sedation:      Not Applicable - Patient had care per Anesthesia. Recommendation:           - Return patient to ICU for ongoing care.                           - NPO.                           - Continue present medications.                           - Continue PPI drip.                           - Add sucralfate.                           - Follow HGB.                           -  Complete the four units of blood transfusions. Procedure Code(s):        --- Professional ---                           [phone], Esophagogastroduodenoscopy, flexible,                            transoral; with control of bleeding, any method Diagnosis Code(s):        --- Professional ---                           K25.4, Chronic or unspecified gastric ulcer with                            hemorrhage                           K21.00, Gastro-esophageal reflux disease with                            esophagitis, without bleeding                           K92.1, Melena (includes Hematochezia) CPT copyright 2019 [employer]. All rights reserved. The codes documented in this report are preliminary and upon coder review may  be revised to meet current compliance requirements. Carol Ada, MD Carol Ada, MD 07/06/2021 3:28:17 PM This report has been  signed electronically. Number of Addenda: 0

## 2021-07-06 NOTE — Progress Notes (Signed)
Subjective: No complaints.  Objective: Vital signs in last 24 hours: Temp:  [97.5 F (36.4 C)-98.8 F (37.1 C)] 98.7 F (37.1 C) (05/26 0838) Pulse Rate:  [71-118] 106 (05/26 0900) Resp:  [14-31] 16 (05/26 0900) BP: (107-168)/(37-96) 127/57 (05/26 0900) SpO2:  [94 %-100 %] 99 % (05/26 0900) Weight:  [87.5 kg] 87.5 kg (05/25 1620) Last BM Date : 07/06/21  Intake/Output from previous day: 05/25 0701 - 05/26 0700 In: 3805.4 [I.V.:1755.5; Blood:1293.8; IV Piggyback:756.1] Out: 600 [Urine:600] Intake/Output this shift: Total I/O In: 455 [I.V.:355; IV Piggyback:100] Out: -   General appearance: alert and no distress GI: soft, non-tender; bowel sounds normal; no masses,  no organomegaly  Lab Results: Recent Labs    07/05/21 1229 07/05/21 1236 07/06/21 0726  WBC 48.2*  --  22.7*  HGB 5.6* 6.8* 3.3*  HCT 18.2* 20.0* 10.4*  PLT 189  --  103*   BMET Recent Labs    07/05/21 1236 07/05/21 1413 07/06/21 0635  NA 142 144 144  K 4.2 4.3 3.9  CL 112* 115* 116*  CO2  --  12* 22  GLUCOSE 193* 177* 185*  BUN 29* 34* 37*  CREATININE 1.20 1.43* 1.17  CALCIUM  --  8.1* 7.4*   LFT Recent Labs    07/06/21 0815  PROT 4.5*  ALBUMIN 2.5*  AST 132*  ALT 34  ALKPHOS 284*  BILITOT 1.0  BILIDIR 0.3*  IBILI 0.7   PT/INR Recent Labs    07/05/21 1229 07/06/21 0635  LABPROT 23.7* 21.0*  INR 2.1* 1.8*   Hepatitis Panel No results for input(s): HEPBSAG, HCVAB, HEPAIGM, HEPBIGM in the last 72 hours. C-Diff No results for input(s): CDIFFTOX in the last 72 hours. Fecal Lactopherrin No results for input(s): FECLLACTOFRN in the last 72 hours.  Studies/Results: DG Chest Port 1 View  Result Date: 07/05/2021 CLINICAL DATA:  Shortness of breath. EXAM: PORTABLE CHEST 1 VIEW COMPARISON:  May 25, 2021. FINDINGS: The heart size and mediastinal contours are within normal limits. Both lungs are clear. Right internal jugular Port-A-Cath is unchanged in position. Stable diffuse  sclerosis of visualized skeleton is noted consistent with metastatic disease. IMPRESSION: No acute cardiopulmonary abnormality is noted. Stable diffuse sclerosis of visualized skeleton consistent with metastatic disease. Electronically Signed   By: Marijo Conception M.D.   On: 07/05/2021 12:18   US Abdomen Limited RUQ (LIVER/GB)  Result Date: 07/05/2021 CLINICAL DATA:  Elevated LFTs. EXAM: ULTRASOUND ABDOMEN LIMITED RIGHT UPPER QUADRANT COMPARISON:  PET-CT 11/14/2020. CT abdomen and pelvis 03/23/2019. FINDINGS: Gallbladder: Not visualized.  No sonographic Murphy sign noted by sonographer. Common bile duct: Diameter: 8.2 mm. Liver: No focal lesion identified. Parenchymal echogenicity is diffusely increased in diffusely heterogeneous. Portal vein is patent on color Doppler imaging with normal direction of blood flow towards the liver. Other: None. IMPRESSION: 1. The gallbladder is not visualized. The common bile duct is mildly prominent in size measuring up 8 mm. Recommend correlation with lab values. (This has significantly decreased in size compared to 02/09 1,021). 2. Heterogeneous echogenic liver likely related to diffuse hepatocellular disease. Electronically Signed   By: Ronney Asters M.D.   On: 07/05/2021 17:18    Medications: Scheduled:  insulin aspart  0-5 Units Subcutaneous QHS   insulin aspart  0-9 Units Subcutaneous TID WC   mouth rinse  15 mL Mouth Rinse BID   [START ON 07/09/2021] pantoprazole  40 mg Intravenous Q12H   Continuous:  sodium chloride 20 mL/hr at 07/06/21 0953   ceFEPime (  MAXIPIME) IV Stopped (07/06/21 5051)   pantoprazole 8 mg/hr (07/06/21 0953)   vancomycin      Assessment/Plan: 1) Anemia. 2) Hematochezia. 3) Metastatic prostate cancer.   The patient continues to have hematochezia.  He had two episodes this past evening and one recently.  His HGB was at 3.3 this AM, but he remains hemodynamically stable.  His EGD was cancelled as a result of the severe  anemia.  Plan: 1) CTA. 2) Agree with transfusions. 3) EGD for tomorrow AM pending the AM CBC and his CTA results.  LOS: 1 day   Stephen Lynch D 07/06/2021, 10:06 AM

## 2021-07-06 NOTE — Progress Notes (Signed)
Pharmacy Antibiotic Note  Stephen Lynch is a 76 y.o. male admitted on 07/05/2021 with bloody stools. Patient with history of prostate cancer with metastases to bone.  Pharmacy has been consulted for vancomycin and cefepime dosing for sepsis.  D2 abx SCr improving 1.43 >> 1.17, CrCl 62 ml/min WBC 48.2 >> 22.7 Afebrile  Plan: Adjust Vancomycin '1750mg'$  IV q24h for estimated AUC 492 with improved SCr; plan to check levels at steady state if continues for goal AUC 400-550. Adjust Cefepime 2 g IV q8h  Follow renal function, cultures and clinical progress for antibiotic dosage adjustments and de-escalation as clinically indicated.  Height: '6\' 1"'$  (185.4 cm) Weight: 87.5 kg (192 lb 14.4 oz) IBW/kg (Calculated) : 79.9  Temp (24hrs), Avg:97.8 F (36.6 C), Min:97.5 F (36.4 C), Max:98.8 F (37.1 C)  Recent Labs  Lab 07/05/21 1229 07/05/21 1236 07/05/21 1413 07/06/21 0635 07/06/21 0726  WBC 48.2*  --   --   --  22.7*  CREATININE  --  1.20 1.43* 1.17  --      Estimated Creatinine Clearance: 62.6 mL/min (by C-G formula based on SCr of 1.17 mg/dL).    Allergies  Allergen Reactions   Chlorhexidine     Unknown reaction   Enalapril     Other reaction(s): Unknown    Antimicrobials this admission: Cefepime 5/25 >> Vancomycin 5/25 >>  Dose adjustments this admission: N/A  Microbiology results: 5/25 BCx: pending 5/25 UCx: ordered  5/25 MRSA PCR: ordered  Thank you for allowing pharmacy to be a part of this patient's care.  Emiliano Dyer, PharmD, BCPS Clinical Pharmacist 07/06/2021 7:46 AM

## 2021-07-06 NOTE — Progress Notes (Signed)
Patient had 268m dark, bloody bowel movement. MD notified. Patient receiving another unit PRBC at this time, remains hemodynamically stable.

## 2021-07-06 NOTE — Anesthesia Preprocedure Evaluation (Deleted)
Anesthesia Evaluation    Airway        Dental   Pulmonary COPD, former smoker,           Cardiovascular hypertension,   EKG 5/25 Sinus tachycardia, prolonged QT   Neuro/Psych negative psych ROS   GI/Hepatic Neg liver ROS, GERD  Medicated,Hematochezia   Endo/Other  diabetes, Well Controlled, Type 2, Insulin DependentHyperlipidemia  Renal/GU Renal diseaseAKI   Metastatic prostate Ca    Musculoskeletal negative musculoskeletal ROS (+)   Abdominal   Peds  Hematology  (+) Blood dyscrasia, anemia , Thrombocytopenia Severe anemia Coagulopathy- INR 2.1   Anesthesia Other Findings   Reproductive/Obstetrics                            Anesthesia Physical Anesthesia Plan  ASA: 3  Anesthesia Plan: MAC   Post-op Pain Management: Minimal or no pain anticipated   Induction: Intravenous  PONV Risk Score and Plan: Propofol infusion and Treatment may vary due to age or medical condition  Airway Management Planned: Natural Airway and Simple Face Mask  Additional Equipment:   Intra-op Plan:   Post-operative Plan:   Informed Consent:   Patient has DNR.     Plan Discussed with:   Anesthesia Plan Comments:         Anesthesia Quick Evaluation

## 2021-07-06 NOTE — Consult Note (Signed)
[facility] Inpatient Consult   07/06/2021  Stephen Lynch 12-Mar-1945 [id number]  [name] is currently active with [name] for chronic disease management services.  Patient has been engaged by a [facility]   Plan: Will continue to follow for progression and disposition plans.  Of note, [facility] Care Management services does not replace or interfere with any services that are arranged by inpatient case management or social work.   Netta Cedars, MSN, RN [facility] Liaison Toll free office [phone]

## 2021-07-07 ENCOUNTER — Inpatient Hospital Stay (HOSPITAL_COMMUNITY): Payer: Medicare Other

## 2021-07-07 DIAGNOSIS — E119 Type 2 diabetes mellitus without complications: Secondary | ICD-10-CM | POA: Diagnosis not present

## 2021-07-07 DIAGNOSIS — D689 Coagulation defect, unspecified: Secondary | ICD-10-CM | POA: Diagnosis not present

## 2021-07-07 DIAGNOSIS — D696 Thrombocytopenia, unspecified: Secondary | ICD-10-CM | POA: Diagnosis not present

## 2021-07-07 DIAGNOSIS — N179 Acute kidney failure, unspecified: Secondary | ICD-10-CM | POA: Diagnosis not present

## 2021-07-07 DIAGNOSIS — D62 Acute posthemorrhagic anemia: Secondary | ICD-10-CM | POA: Diagnosis not present

## 2021-07-07 LAB — COMPREHENSIVE METABOLIC PANEL
ALT: 32 U/L (ref 0–44)
AST: 81 U/L — ABNORMAL HIGH (ref 15–41)
Albumin: 2.3 g/dL — ABNORMAL LOW (ref 3.5–5.0)
Alkaline Phosphatase: 278 U/L — ABNORMAL HIGH (ref 38–126)
Anion gap: 6 (ref 5–15)
BUN: 33 mg/dL — ABNORMAL HIGH (ref 8–23)
CO2: 23 mmol/L (ref 22–32)
Calcium: 7.7 mg/dL — ABNORMAL LOW (ref 8.9–10.3)
Chloride: 116 mmol/L — ABNORMAL HIGH (ref 98–111)
Creatinine, Ser: 1.12 mg/dL (ref 0.61–1.24)
GFR, Estimated: >60 mL/min (ref >60)
Glucose, Bld: 183 mg/dL — ABNORMAL HIGH (ref 70–99)
Potassium: 3.8 mmol/L (ref 3.5–5.1)
Sodium: 145 mmol/L (ref 135–145)
Total Bilirubin: 1.6 mg/dL — ABNORMAL HIGH (ref 0.3–1.2)
Total Protein: 4.4 g/dL — ABNORMAL LOW (ref 6.5–8.1)

## 2021-07-07 LAB — CBC
HCT: 24.2 % — ABNORMAL LOW (ref 39.0–52.0)
HCT: 19.1 % — ABNORMAL LOW (ref 39.0–52.0)
Hemoglobin: 8.4 g/dL — ABNORMAL LOW (ref 13.0–17.0)
Hemoglobin: 6.5 g/dL — CL (ref 13.0–17.0)
MCH: 30.5 pg (ref 26.0–34.0)
MCH: 31 pg (ref 26.0–34.0)
MCHC: 34.7 g/dL (ref 30.0–36.0)
MCHC: 34 g/dL (ref 30.0–36.0)
MCV: 88 fL (ref 80.0–100.0)
MCV: 91 fL (ref 80.0–100.0)
Platelets: 89 10*3/uL — ABNORMAL LOW (ref 150–400)
Platelets: 71 10*3/uL — ABNORMAL LOW (ref 150–400)
RBC: 2.75 MIL/uL — ABNORMAL LOW (ref 4.22–5.81)
RBC: 2.1 MIL/uL — ABNORMAL LOW (ref 4.22–5.81)
RDW: 17.4 % — ABNORMAL HIGH (ref 11.5–15.5)
RDW: 18.3 % — ABNORMAL HIGH (ref 11.5–15.5)
WBC: 23.8 10*3/uL — ABNORMAL HIGH (ref 4.0–10.5)
WBC: 18 10*3/uL — ABNORMAL HIGH (ref 4.0–10.5)
nRBC: 5.9 % — ABNORMAL HIGH (ref 0.0–0.2)
nRBC: 2.3 % — ABNORMAL HIGH (ref 0.0–0.2)

## 2021-07-07 LAB — GLUCOSE, CAPILLARY
Glucose-Capillary: 162 mg/dL — ABNORMAL HIGH (ref 70–99)
Glucose-Capillary: 274 mg/dL — ABNORMAL HIGH (ref 70–99)
Glucose-Capillary: 383 mg/dL — ABNORMAL HIGH (ref 70–99)
Glucose-Capillary: 372 mg/dL — ABNORMAL HIGH (ref 70–99)

## 2021-07-07 LAB — PROTIME-INR
INR: 1.5 — ABNORMAL HIGH (ref 0.8–1.2)
Prothrombin Time: 18.1 seconds — ABNORMAL HIGH (ref 11.4–15.2)

## 2021-07-07 LAB — AMMONIA: Ammonia: 54 umol/L — ABNORMAL HIGH (ref 9–35)

## 2021-07-07 LAB — PREPARE RBC (CROSSMATCH)

## 2021-07-07 MED ORDER — ALBUMIN HUMAN 25 % IV SOLN
50.0000 g | Freq: Once | INTRAVENOUS | Status: AC
  Administered 2021-07-07: 50 g via INTRAVENOUS
  Filled 2021-07-07: qty 200

## 2021-07-07 MED ORDER — MEGESTROL ACETATE 400 MG/10ML PO SUSP
400.0000 mg | Freq: Two times a day (BID) | ORAL | Status: DC
  Administered 2021-07-07 – 2021-07-10 (×7): 400 mg via ORAL
  Filled 2021-07-07 (×8): qty 10

## 2021-07-07 MED ORDER — FUROSEMIDE 10 MG/ML IJ SOLN
20.0000 mg | Freq: Once | INTRAMUSCULAR | Status: AC
  Administered 2021-07-07: 20 mg via INTRAVENOUS
  Filled 2021-07-07: qty 2

## 2021-07-07 MED ORDER — HYDRALAZINE HCL 20 MG/ML IJ SOLN
10.0000 mg | Freq: Four times a day (QID) | INTRAMUSCULAR | Status: DC | PRN
  Administered 2021-07-07: 10 mg via INTRAVENOUS
  Filled 2021-07-07: qty 1

## 2021-07-07 MED ORDER — ROSUVASTATIN CALCIUM 10 MG PO TABS
10.0000 mg | ORAL_TABLET | Freq: Every day | ORAL | Status: DC
  Administered 2021-07-07 – 2021-07-10 (×4): 10 mg via ORAL
  Filled 2021-07-07 (×4): qty 1

## 2021-07-07 MED ORDER — SODIUM CHLORIDE 0.9% IV SOLUTION: Freq: Once | INTRAVENOUS | Status: AC

## 2021-07-07 NOTE — Progress Notes (Signed)
PROGRESS NOTE    Stephen Lynch  MGN:[id number] DOB: 16-Nov-1945 DOA: 07/05/2021 PCP: [facility]    Brief Narrative:  76 y/o male with history of stage 4 prostate CA on chemo, IDDM, admitted to the hospital with melena, acute blood loss anemia, AKI and metabolic acidosis. He was also noted to be coagulopathic with INR of 2.1, no reported history of cirrhosis. Baseline hemoglobin noted to be 10.7 on 5/16, admission hemoglobin 5.6. He received 2 unit prbc and FFP and follow up hemoglobin 3.3. He received another 4 unit prbc. GI following. He underwent EGD that showed gastric ulcer s/p Epi, BICAP, and hemoclipping..   Assessment & Plan:   Principal Problem:   Acute blood loss anemia Active Problems:   Dyslipidemia   Diabetes mellitus without complication (HCC)   Goals of care, counseling/discussion   Uncontrolled type 2 diabetes mellitus with hyperglycemia, with long-term current use of insulin (HCC)   Malignant neoplasm of prostate metastatic to bone (HCC)   Hypoalbuminemia due to protein-calorie malnutrition (HCC)   AKI (acute kidney injury) ([place])   Melena   Leukocytosis   Metabolic acidosis   Coagulopathy (HCC)   Metastases to the liver (HCC)   Acute blood loss anemia -Patient's hemoglobin was 10.7 on 5/16 -Admission hemoglobin noted to be 5.6 -2 units PRBC were transfused on admission -follow up hemoglobin 3.3 (confirmed on recheck per staff) -He received another 4 unit prbc -follow up hemoglobin improved at 8.4 -continue to follow   Melena secondary to bleeding gastric ulcer -GI following -Continue with volume resuscitation -No history of NSAIDs -Continue on Protonix infusion -EGD on 5/26: gastric ulcer s/p Epi, BICAP, and hemoclipping. -continue PPI infusion through the weekend -continue carafate -clear liquids, advance as tolerated  Leukocytosis -Suspect this is stress response from rapid GI bleed -Since he appears to be having shivering and is  immunocompromised from chemotherapy, will cover with broad-spectrum antibiotics for now -urine and blood cultures have not shown any growth to date -Chest x-ray without evidence of pneumonia -WBC 48.2 -> 23.8 -plan to DC antibiotics tomorrow if cultures remain negative and patient clinically stable  Thrombocytopenia -possibly related to consumption from large GI bleed -continue to follow   Coagulopathy -Etiology is unclear but possibly related to liver mets  -Admission INR 2.1 -No reported history of cirrhosis -RUQ ultrasound did not show liver changes consistent with cirrhosis, portal vein was patent -CT abdomen comments on multiple possible liver mets -Received vitamin K/FFP 5/25 -current INR 1.5   Acute kidney injury -Likely related to volume depletion/anemia -creatinine 1.4 on admission -creatinine improved to 1.1 with IV fluids -follow urine output   Normal anion gap metabolic acidosis -Likely related to vomiting/GI losses -improved with bicarbonate infusion   Hypoalbuminemia -Suspect related to poor nutritional status -Request nutrition to follow -continue megace   Stage IV prostate cancer with mets to bone -CT abd shows possible liver mets -Followed by Dr. Alen Blew, current treatment is palliative -Dr. Alen Blew added to treatment team.   Diabetes mellitus type 2, insulin-dependent -Hold basal insulin until p.o. intake has improved -Cover with sliding scale insulin  Hyperlipidemia -continue statin   Goals of care -Followed by palliative care as an outpatient -I had a lengthy discussion with patient and his fiance, [name] -Stephen Lynch that she is patient's power of attorney and will bring in appropriate paperwork -We discussed his current medical comorbidities and expected prognosis in the event of a cardiopulmonary arrest -After discussion, patient agrees with DNR status at  this point. -We will request palliative care follow since further discussions  around goals of care and potential transition to hospice if his condition does not improve would be reasonable   DVT prophylaxis: SCDs Start: 07/05/21 1631  Code Status: DNR Family Communication: updated patient fiance Ms. Stephen Lynch Disposition Plan: Status is: Inpatient Remains inpatient appropriate because: continued management/work up of GI bleeding and associated anemia     Consultants:  GI Palliative care  Procedures:  5/26 EGD: gastric ulcer s/p Epi, BICAP, and hemoclipping.  Antimicrobials:  Vancomycin 5/25> Cefepime 5/25>    Subjective: No abdominal pain, no vomiting. Does not recall last BM. Feels uncomfortable laying in bed.  Objective: Vitals:   07/07/21 0500 07/07/21 0600 07/07/21 0700 07/07/21 0800  BP: (!) 149/64 (!) 152/68 (!) 166/119 (!) 159/76  Pulse: 98 99 93 91  Resp: '16 15 16 12  '$ Temp:   (!) 97.3 F (36.3 C)   TempSrc:   Oral   SpO2: 100% 100% 100% 100%  Weight:      Height:        Intake/Output Summary (Last 24 hours) at 07/07/2021 0930 Last data filed at 07/07/2021 7829 Gross per 24 hour  Intake 2776.56 ml  Output 1750 ml  Net 1026.56 ml   Filed Weights   07/05/21 1620  Weight: 87.5 kg    Examination:  General exam: Appears calm and comfortable  Respiratory system: scattered rhonchi. Respiratory effort normal. Cardiovascular system: S1 & S2 heard, RRR. No JVD, murmurs, rubs, gallops or clicks.  Gastrointestinal system: Abdomen is nondistended, soft and nontender. No organomegaly or masses felt. Normal bowel sounds heard. Central nervous system: Alert and oriented. No focal neurological deficits. Extremities: 1+ edema bilaterally Skin: No rashes, lesions or ulcers Psychiatry: Judgement and insight appear normal. Mood & affect appropriate.     Data Reviewed: I have personally reviewed following labs and imaging studies  CBC: Recent Labs  Lab 07/05/21 1229 07/05/21 1236 07/06/21 0726 07/06/21 1338 07/06/21 2109  07/07/21 0500  WBC 48.2*  --  22.7* 25.9* 29.3* 23.8*  NEUTROABS 35.7*  --   --   --   --   --   HGB 5.6* 6.8* 3.3* 6.3* 9.4* 8.4*  HCT 18.2* 20.0* 10.4* 18.5* 27.3* 24.2*  MCV 102.2*  --  88.1 88.1 88.3 88.0  PLT 189  --  103* 97* 90* 89*   Basic Metabolic Panel: Recent Labs  Lab 07/05/21 1236 07/05/21 1413 07/06/21 0635 07/07/21 0500  NA 142 144 144 145  K 4.2 4.3 3.9 3.8  CL 112* 115* 116* 116*  CO2  --  12* 22 23  GLUCOSE 193* 177* 185* 183*  BUN 29* 34* 37* 33*  CREATININE 1.20 1.43* 1.17 1.12  CALCIUM  --  8.1* 7.4* 7.7*   GFR: Estimated Creatinine Clearance: 65.4 mL/min (by C-G formula based on SCr of 1.12 mg/dL). Liver Function Tests: Recent Labs  Lab 07/05/21 1413 07/06/21 0815 07/07/21 0500  AST 55* 132* 81*  ALT 23 34 32  ALKPHOS 379* 284* 278*  BILITOT 1.0 1.0 1.6*  PROT 4.5* 4.5* 4.4*  ALBUMIN 2.0* 2.5* 2.3*   No results for input(s): LIPASE, AMYLASE in the last 168 hours. Recent Labs  Lab 07/05/21 1229 07/06/21 0815  AMMONIA 73* 58*   Coagulation Profile: Recent Labs  Lab 07/05/21 1229 07/06/21 0635 07/07/21 0500  INR 2.1* 1.8* 1.5*   Cardiac Enzymes: No results for input(s): CKTOTAL, CKMB, CKMBINDEX, TROPONINI in the last 168 hours. BNP (last  3 results) No results for input(s): PROBNP in the last 8760 hours. HbA1C: No results for input(s): HGBA1C in the last 72 hours. CBG: Recent Labs  Lab 07/06/21 0743 07/06/21 1146 07/06/21 1636 07/06/21 2111 07/07/21 0736  GLUCAP 170* 155* 156* 150* 162*   Lipid Profile: No results for input(s): CHOL, HDL, LDLCALC, TRIG, CHOLHDL, LDLDIRECT in the last 72 hours. Thyroid Function Tests: No results for input(s): TSH, T4TOTAL, FREET4, T3FREE, THYROIDAB in the last 72 hours. Anemia Panel: No results for input(s): VITAMINB12, FOLATE, FERRITIN, TIBC, IRON, RETICCTPCT in the last 72 hours. Sepsis Labs: No results for input(s): PROCALCITON, LATICACIDVEN in the last 168 hours.  Recent Results  (from the past 240 hour(s))  Culture, blood (Routine X 2) w Reflex to ID Panel     Status: None (Preliminary result)   Collection Time: 07/05/21  5:05 PM   Specimen: BLOOD  Result Value Ref Range Status   Specimen Description   Final    BLOOD RIGHT ANTECUBITAL Performed at [facility] [address] [place], [place] [address]    Special Requests   Final    IN PEDIATRIC BOTTLE Blood Culture adequate volume Performed at [facility] [address] [place], [place] [address]    Culture   Final    NO GROWTH 2 DAYS Performed at [facility] [address], [place], [place] [address]    Report Status PENDING  Incomplete  Urine Culture     Status: Abnormal   Collection Time: 07/05/21  7:24 PM   Specimen: Urine, Clean Catch  Result Value Ref Range Status   Specimen Description   Final    URINE, CLEAN CATCH Performed at [facility], [place] [address], [place], [place] [address]    Special Requests   Final    NONE Performed at [facility], [place] [address] [place], [place] [address]    Culture (A)  Final    <10,000 COLONIES/mL INSIGNIFICANT GROWTH Performed at [facility] [address] [place],  [address]    Report Status 07/06/2021 FINAL  Final         Radiology Studies: DG Chest Port 1 View  Result Date: 07/05/2021 CLINICAL DATA:  Shortness of breath. EXAM: PORTABLE CHEST 1 VIEW COMPARISON:  May 25, 2021. FINDINGS: The heart size and mediastinal contours are within normal limits. Both lungs are clear. Right internal jugular Port-A-Cath is unchanged in position. Stable diffuse sclerosis of visualized skeleton is noted consistent with metastatic disease. IMPRESSION: No acute cardiopulmonary abnormality is noted. Stable diffuse sclerosis of visualized skeleton consistent with metastatic disease. Electronically Signed   By: Marijo Conception M.D.   On: 07/05/2021 12:18   CT Angio  Abd/Pel w/ and/or w/o  Result Date: 07/06/2021 CLINICAL DATA:  GI bleed and anemia with both melena and hematochezia. History metastatic prostate carcinoma. EXAM: CT ANGIOGRAPHY ABDOMEN AND PELVIS WITH CONTRAST AND WITHOUT CONTRAST TECHNIQUE: Multidetector CT imaging of the abdomen and pelvis was performed using the standard protocol during bolus administration of intravenous contrast. Multiplanar reconstructed images and MIPs were obtained and reviewed to evaluate the vascular anatomy. RADIATION DOSE REDUCTION: This exam was performed according to the departmental dose-optimization program which includes automated exposure control, adjustment of the mA and/or kV according to patient size and/or use of iterative reconstruction technique. CONTRAST:  196m OMNIPAQUE IOHEXOL 350 MG/ML SOLN COMPARISON:  PET scan on 11/14/2020 FINDINGS: All phases of the study are limited by patient  motion artifact. VASCULAR Aorta: Normally patent. No significant atherosclerosis with minimal calcified plaque in the distal abdominal aorta. No aneurysm or dissection. Celiac: Normally patent.  Normally patent branch vessels. SMA: Normally patent. Replaced right hepatic artery off of the proximal SMA demonstrates normal patency. Renals: Normally patent bilateral single renal arteries. IMA: Normally patent. Inflow: Normally patent bilateral iliac arteries. Proximal Outflow: Calcified plaque in bilateral common femoral arteries without significant stenosis. Normally patent bilateral femoral bifurcations. Veins: Venous phase imaging demonstrates normal patency of major venous structures including the IVC, renal veins, hepatic veins, portal vein, splenic vein, mesenteric veins, iliac veins and common femoral veins. Review of the MIP images confirms the above findings. NON-VASCULAR Lower chest: No acute abnormality. Hepatobiliary: New multifocal areas of low density in both right and left lobes of the liver since 2022 with the largest measuring  approximately 3 cm in the inferior right lobe. Findings are consistent with metastatic disease. The gallbladder is unremarkable and there is no biliary ductal dilatation. Pancreas: Atrophic pancreas with stable chronic diffuse ductal dilatation and prominence. Spleen: Normal in size without focal abnormality. Adrenals/Urinary Tract: Adrenal glands are unremarkable. Kidneys are normal, without renal calculi, focal lesion, or hydronephrosis. Bladder is unremarkable. Stomach/Bowel: There is a suggestion of a focus increased density in a rounded configuration within the distal stomach on the arterial phase of imaging which is not present on the pre contrast images. This does not increased significantly in size on the venous phase but is suspicious for a focus of acute hemorrhage in the region of the gastric antrum. No overt ulceration or associated mass identified by CT. No other regions hemorrhage identified within the gastrointestinal tract by CTA. Lymphatic: No enlarged abdominal or pelvic lymph nodes. Reproductive: No prostate enlargement. Other: No abdominal wall hernia or abnormality. No abdominopelvic ascites. Musculoskeletal: Diffuse skeletal metastatic disease again known from known widespread metastatic prostate carcinoma. Bony disease is largely sclerotic with focal areas lytic disease also present. No vertebral body collapse or visible pathologic fractures. IMPRESSION: 1. Small focus of probable contrast extravasation on the arterial phase of imaging in the antrum of the stomach is suspicious for focal bleeding. This is not evident on the unenhanced images and does not increased significantly on the venous phase. No obvious associated ulceration or mass by CT. 2. New multiple lesions in both right and left lobes of the liver are consistent with hepatic metastatic disease. 3. Stable diffuse skeletal metastatic disease from known metastatic prostate carcinoma. 4. These results will be called to the ordering  clinician or representative by the Radiologist Assistant, and communication documented in the PACS or [employer]. Electronically Signed   By: Aletta Edouard M.D.   On: 07/06/2021 11:52   US Abdomen Limited RUQ (LIVER/GB)  Result Date: 07/05/2021 CLINICAL DATA:  Elevated LFTs. EXAM: ULTRASOUND ABDOMEN LIMITED RIGHT UPPER QUADRANT COMPARISON:  PET-CT 11/14/2020. CT abdomen and pelvis 03/23/2019. FINDINGS: Gallbladder: Not visualized.  No sonographic Murphy sign noted by sonographer. Common bile duct: Diameter: 8.2 mm. Liver: No focal lesion identified. Parenchymal echogenicity is diffusely increased in diffusely heterogeneous. Portal vein is patent on color Doppler imaging with normal direction of blood flow towards the liver. Other: None. IMPRESSION: 1. The gallbladder is not visualized. The common bile duct is mildly prominent in size measuring up 8 mm. Recommend correlation with lab values. (This has significantly decreased in size compared to 02/09 1,021). 2. Heterogeneous echogenic liver likely related to diffuse hepatocellular disease. Electronically Signed   By: Tina Griffiths.D.  On: 07/05/2021 17:18        Scheduled Meds:  feeding supplement  1 Container Oral BID BM   feeding supplement  237 mL Oral Q24H   insulin aspart  0-5 Units Subcutaneous QHS   insulin aspart  0-9 Units Subcutaneous TID WC   mouth rinse  15 mL Mouth Rinse BID   megestrol  400 mg Oral BID   multivitamin with minerals  1 tablet Oral Daily   [START ON 07/09/2021] pantoprazole  40 mg Intravenous Q12H   rosuvastatin  10 mg Oral Daily   sodium chloride flush  10-40 mL Intracatheter Q12H   sucralfate  1 g Oral TID WC & HS   Continuous Infusions:  albumin human     ceFEPime (MAXIPIME) IV 2 g (07/07/21 0904)   pantoprazole 8 mg/hr (07/07/21 0904)   vancomycin Stopped (07/06/21 2103)     LOS: 2 days    Time spent: 49mns    JKathie Dike MD [facility]   If 7PM-7AM, please contact  night-coverage [email or web address]  07/07/2021, 9:30 AM

## 2021-07-07 NOTE — Progress Notes (Signed)
Subjective: Thirsty.  No reports of any bleeding.  Objective: Vital signs in last 24 hours: Temp:  [97.3 F (36.3 C)-98.7 F (37.1 C)] 97.3 F (36.3 C) (05/27 0700) Pulse Rate:  [91-114] 91 (05/27 0800) Resp:  [10-30] 12 (05/27 0800) BP: (104-192)/(48-164) 159/76 (05/27 0800) SpO2:  [91 %-100 %] 100 % (05/27 0800) Last BM Date : 07/06/21  Intake/Output from previous day: 05/26 0701 - 05/27 0700 In: 2776.6 [I.V.:683; Blood:1504.2; IV Piggyback:589.4] Out: 1750 [Urine:1350; Stool:400] Intake/Output this shift: No intake/output data recorded.  General appearance: alert GI: soft, non-tender; bowel sounds normal; no masses,  no organomegaly  Lab Results: Recent Labs    07/06/21 1338 07/06/21 2109 07/07/21 0500  WBC 25.9* 29.3* 23.8*  HGB 6.3* 9.4* 8.4*  HCT 18.5* 27.3* 24.2*  PLT 97* 90* 89*   BMET Recent Labs    07/05/21 1413 07/06/21 0635 07/07/21 0500  NA 144 144 145  K 4.3 3.9 3.8  CL 115* 116* 116*  CO2 12* 22 23  GLUCOSE 177* 185* 183*  BUN 34* 37* 33*  CREATININE 1.43* 1.17 1.12  CALCIUM 8.1* 7.4* 7.7*   LFT Recent Labs    07/06/21 0815 07/07/21 0500  PROT 4.5* 4.4*  ALBUMIN 2.5* 2.3*  AST 132* 81*  ALT 34 32  ALKPHOS 284* 278*  BILITOT 1.0 1.6*  BILIDIR 0.3*  --   IBILI 0.7  --    PT/INR Recent Labs    07/06/21 0635 07/07/21 0500  LABPROT 21.0* 18.1*  INR 1.8* 1.5*   Hepatitis Panel No results for input(s): HEPBSAG, HCVAB, HEPAIGM, HEPBIGM in the last 72 hours. C-Diff No results for input(s): CDIFFTOX in the last 72 hours. Fecal Lactopherrin No results for input(s): FECLLACTOFRN in the last 72 hours.  Studies/Results: DG Chest Port 1 View  Result Date: 07/05/2021 CLINICAL DATA:  Shortness of breath. EXAM: PORTABLE CHEST 1 VIEW COMPARISON:  May 25, 2021. FINDINGS: The heart size and mediastinal contours are within normal limits. Both lungs are clear. Right internal jugular Port-A-Cath is unchanged in position. Stable diffuse  sclerosis of visualized skeleton is noted consistent with metastatic disease. IMPRESSION: No acute cardiopulmonary abnormality is noted. Stable diffuse sclerosis of visualized skeleton consistent with metastatic disease. Electronically Signed   By: Marijo Conception M.D.   On: 07/05/2021 12:18   CT Angio Abd/Pel w/ and/or w/o  Result Date: 07/06/2021 CLINICAL DATA:  GI bleed and anemia with both melena and hematochezia. History metastatic prostate carcinoma. EXAM: CT ANGIOGRAPHY ABDOMEN AND PELVIS WITH CONTRAST AND WITHOUT CONTRAST TECHNIQUE: Multidetector CT imaging of the abdomen and pelvis was performed using the standard protocol during bolus administration of intravenous contrast. Multiplanar reconstructed images and MIPs were obtained and reviewed to evaluate the vascular anatomy. RADIATION DOSE REDUCTION: This exam was performed according to the departmental dose-optimization program which includes automated exposure control, adjustment of the mA and/or kV according to patient size and/or use of iterative reconstruction technique. CONTRAST:  178m OMNIPAQUE IOHEXOL 350 MG/ML SOLN COMPARISON:  PET scan on 11/14/2020 FINDINGS: All phases of the study are limited by patient motion artifact. VASCULAR Aorta: Normally patent. No significant atherosclerosis with minimal calcified plaque in the distal abdominal aorta. No aneurysm or dissection. Celiac: Normally patent.  Normally patent branch vessels. SMA: Normally patent. Replaced right hepatic artery off of the proximal SMA demonstrates normal patency. Renals: Normally patent bilateral single renal arteries. IMA: Normally patent. Inflow: Normally patent bilateral iliac arteries. Proximal Outflow: Calcified plaque in bilateral common femoral arteries without significant  stenosis. Normally patent bilateral femoral bifurcations. Veins: Venous phase imaging demonstrates normal patency of major venous structures including the IVC, renal veins, hepatic veins, portal  vein, splenic vein, mesenteric veins, iliac veins and common femoral veins. Review of the MIP images confirms the above findings. NON-VASCULAR Lower chest: No acute abnormality. Hepatobiliary: New multifocal areas of low density in both right and left lobes of the liver since 2022 with the largest measuring approximately 3 cm in the inferior right lobe. Findings are consistent with metastatic disease. The gallbladder is unremarkable and there is no biliary ductal dilatation. Pancreas: Atrophic pancreas with stable chronic diffuse ductal dilatation and prominence. Spleen: Normal in size without focal abnormality. Adrenals/Urinary Tract: Adrenal glands are unremarkable. Kidneys are normal, without renal calculi, focal lesion, or hydronephrosis. Bladder is unremarkable. Stomach/Bowel: There is a suggestion of a focus increased density in a rounded configuration within the distal stomach on the arterial phase of imaging which is not present on the pre contrast images. This does not increased significantly in size on the venous phase but is suspicious for a focus of acute hemorrhage in the region of the gastric antrum. No overt ulceration or associated mass identified by CT. No other regions hemorrhage identified within the gastrointestinal tract by CTA. Lymphatic: No enlarged abdominal or pelvic lymph nodes. Reproductive: No prostate enlargement. Other: No abdominal wall hernia or abnormality. No abdominopelvic ascites. Musculoskeletal: Diffuse skeletal metastatic disease again known from known widespread metastatic prostate carcinoma. Bony disease is largely sclerotic with focal areas lytic disease also present. No vertebral body collapse or visible pathologic fractures. IMPRESSION: 1. Small focus of probable contrast extravasation on the arterial phase of imaging in the antrum of the stomach is suspicious for focal bleeding. This is not evident on the unenhanced images and does not increased significantly on the  venous phase. No obvious associated ulceration or mass by CT. 2. New multiple lesions in both right and left lobes of the liver are consistent with hepatic metastatic disease. 3. Stable diffuse skeletal metastatic disease from known metastatic prostate carcinoma. 4. These results will be called to the ordering clinician or representative by the Radiologist Assistant, and communication documented in the PACS or [employer]. Electronically Signed   By: Aletta Edouard M.D.   On: 07/06/2021 11:52   US Abdomen Limited RUQ (LIVER/GB)  Result Date: 07/05/2021 CLINICAL DATA:  Elevated LFTs. EXAM: ULTRASOUND ABDOMEN LIMITED RIGHT UPPER QUADRANT COMPARISON:  PET-CT 11/14/2020. CT abdomen and pelvis 03/23/2019. FINDINGS: Gallbladder: Not visualized.  No sonographic Murphy sign noted by sonographer. Common bile duct: Diameter: 8.2 mm. Liver: No focal lesion identified. Parenchymal echogenicity is diffusely increased in diffusely heterogeneous. Portal vein is patent on color Doppler imaging with normal direction of blood flow towards the liver. Other: None. IMPRESSION: 1. The gallbladder is not visualized. The common bile duct is mildly prominent in size measuring up 8 mm. Recommend correlation with lab values. (This has significantly decreased in size compared to 02/09 1,021). 2. Heterogeneous echogenic liver likely related to diffuse hepatocellular disease. Electronically Signed   By: Ronney Asters M.D.   On: 07/05/2021 17:18    Medications: Scheduled:  feeding supplement  1 Container Oral BID BM   feeding supplement  237 mL Oral Q24H   insulin aspart  0-5 Units Subcutaneous QHS   insulin aspart  0-9 Units Subcutaneous TID WC   mouth rinse  15 mL Mouth Rinse BID   multivitamin with minerals  1 tablet Oral Daily   [START ON 07/09/2021] pantoprazole  40 mg Intravenous Q12H   sodium chloride flush  10-40 mL Intracatheter Q12H   sucralfate  1 g Oral TID WC & HS   Continuous:  ceFEPime (MAXIPIME) IV 2 g  (07/07/21 0904)   pantoprazole 8 mg/hr (07/07/21 0904)   vancomycin Stopped (07/06/21 2103)    Assessment/Plan: 1) Gastric ulcer s/p Epi, BICAP, and hemoclipping. 2) Anemia - stable. 3) Metastatic prostate cancer.   The patient did not report bleeding and this was confirmed with nursing.  His HGB is stable.  He will pass old blood as he has a very large clot in this gastric lumen.  Plan: 1) Continue with PPI drip through the weekend.  Then he can be converted to PPI BID. 2) Continue with sucralfate. 3) Advance to a clear liquid diet.  LOS: 2 days   Stephen Lynch D 07/07/2021, 9:26 AM

## 2021-07-08 DIAGNOSIS — N179 Acute kidney failure, unspecified: Secondary | ICD-10-CM | POA: Diagnosis not present

## 2021-07-08 DIAGNOSIS — D62 Acute posthemorrhagic anemia: Secondary | ICD-10-CM | POA: Diagnosis not present

## 2021-07-08 DIAGNOSIS — D689 Coagulation defect, unspecified: Secondary | ICD-10-CM | POA: Diagnosis not present

## 2021-07-08 DIAGNOSIS — E119 Type 2 diabetes mellitus without complications: Secondary | ICD-10-CM | POA: Diagnosis not present

## 2021-07-08 LAB — COMPREHENSIVE METABOLIC PANEL
ALT: 23 U/L (ref 0–44)
AST: 42 U/L — ABNORMAL HIGH (ref 15–41)
Albumin: 2.6 g/dL — ABNORMAL LOW (ref 3.5–5.0)
Alkaline Phosphatase: 221 U/L — ABNORMAL HIGH (ref 38–126)
Anion gap: 7 (ref 5–15)
BUN: 25 mg/dL — ABNORMAL HIGH (ref 8–23)
CO2: 21 mmol/L — ABNORMAL LOW (ref 22–32)
Calcium: 7.7 mg/dL — ABNORMAL LOW (ref 8.9–10.3)
Chloride: 109 mmol/L (ref 98–111)
Creatinine, Ser: 0.92 mg/dL (ref 0.61–1.24)
GFR, Estimated: >60 mL/min (ref >60)
Glucose, Bld: 354 mg/dL — ABNORMAL HIGH (ref 70–99)
Potassium: 3.6 mmol/L (ref 3.5–5.1)
Sodium: 137 mmol/L (ref 135–145)
Total Bilirubin: 1 mg/dL (ref 0.3–1.2)
Total Protein: 4.6 g/dL — ABNORMAL LOW (ref 6.5–8.1)

## 2021-07-08 LAB — CBC
HCT: 27.4 % — ABNORMAL LOW (ref 39.0–52.0)
HCT: 24.4 % — ABNORMAL LOW (ref 39.0–52.0)
HCT: 29.1 % — ABNORMAL LOW (ref 39.0–52.0)
Hemoglobin: 9.3 g/dL — ABNORMAL LOW (ref 13.0–17.0)
Hemoglobin: 8.3 g/dL — ABNORMAL LOW (ref 13.0–17.0)
Hemoglobin: 9.5 g/dL — ABNORMAL LOW (ref 13.0–17.0)
MCH: 30.6 pg (ref 26.0–34.0)
MCH: 30.5 pg (ref 26.0–34.0)
MCH: 30.2 pg (ref 26.0–34.0)
MCHC: 33.9 g/dL (ref 30.0–36.0)
MCHC: 34 g/dL (ref 30.0–36.0)
MCHC: 32.6 g/dL (ref 30.0–36.0)
MCV: 90.1 fL (ref 80.0–100.0)
MCV: 89.7 fL (ref 80.0–100.0)
MCV: 92.4 fL (ref 80.0–100.0)
Platelets: 83 10*3/uL — ABNORMAL LOW (ref 150–400)
Platelets: 70 10*3/uL — ABNORMAL LOW (ref 150–400)
Platelets: 77 10*3/uL — ABNORMAL LOW (ref 150–400)
RBC: 3.04 MIL/uL — ABNORMAL LOW (ref 4.22–5.81)
RBC: 2.72 MIL/uL — ABNORMAL LOW (ref 4.22–5.81)
RBC: 3.15 MIL/uL — ABNORMAL LOW (ref 4.22–5.81)
RDW: 18.4 % — ABNORMAL HIGH (ref 11.5–15.5)
RDW: 19.3 % — ABNORMAL HIGH (ref 11.5–15.5)
RDW: 20.7 % — ABNORMAL HIGH (ref 11.5–15.5)
WBC: 19.3 10*3/uL — ABNORMAL HIGH (ref 4.0–10.5)
WBC: 15.2 10*3/uL — ABNORMAL HIGH (ref 4.0–10.5)
WBC: 16.4 10*3/uL — ABNORMAL HIGH (ref 4.0–10.5)
nRBC: 2.5 % — ABNORMAL HIGH (ref 0.0–0.2)
nRBC: 2.1 % — ABNORMAL HIGH (ref 0.0–0.2)
nRBC: 1.5 % — ABNORMAL HIGH (ref 0.0–0.2)

## 2021-07-08 LAB — PREPARE FRESH FROZEN PLASMA
Unit division: 0
Unit division: 0

## 2021-07-08 LAB — BPAM FFP
Blood Product Expiration Date: 202305302359
Blood Product Expiration Date: 202305302359
ISSUE DATE / TIME: 202305260000
ISSUE DATE / TIME: 202305260220
Unit Type and Rh: 8400
Unit Type and Rh: 8400

## 2021-07-08 LAB — AMMONIA: Ammonia: 52 umol/L — ABNORMAL HIGH (ref 9–35)

## 2021-07-08 LAB — GLUCOSE, CAPILLARY
Glucose-Capillary: 345 mg/dL — ABNORMAL HIGH (ref 70–99)
Glucose-Capillary: 451 mg/dL — ABNORMAL HIGH (ref 70–99)
Glucose-Capillary: 475 mg/dL — ABNORMAL HIGH (ref 70–99)
Glucose-Capillary: 413 mg/dL — ABNORMAL HIGH (ref 70–99)
Glucose-Capillary: 167 mg/dL — ABNORMAL HIGH (ref 70–99)
Glucose-Capillary: 93 mg/dL (ref 70–99)

## 2021-07-08 MED ORDER — INSULIN GLARGINE-YFGN 100 UNIT/ML ~~LOC~~ SOLN
20.0000 [IU] | Freq: Every day | SUBCUTANEOUS | Status: DC
Start: 2021-07-08 — End: 2021-07-10
  Administered 2021-07-08 – 2021-07-10 (×3): 20 [IU] via SUBCUTANEOUS
  Filled 2021-07-08 (×3): qty 0.2

## 2021-07-08 MED ORDER — INSULIN ASPART 100 UNIT/ML IJ SOLN
0.0000 [IU] | Freq: Three times a day (TID) | INTRAMUSCULAR | Status: DC
  Administered 2021-07-08: 4 [IU] via SUBCUTANEOUS
  Administered 2021-07-09: 7 [IU] via SUBCUTANEOUS
  Administered 2021-07-09: 15 [IU] via SUBCUTANEOUS
  Administered 2021-07-10: 7 [IU] via SUBCUTANEOUS
  Administered 2021-07-10: 11 [IU] via SUBCUTANEOUS
  Administered 2021-07-10: 15 [IU] via SUBCUTANEOUS

## 2021-07-08 MED ORDER — METOPROLOL TARTRATE 25 MG PO TABS
25.0000 mg | ORAL_TABLET | Freq: Two times a day (BID) | ORAL | Status: DC
  Administered 2021-07-08 – 2021-07-10 (×5): 25 mg via ORAL
  Filled 2021-07-08 (×5): qty 1

## 2021-07-08 MED ORDER — INSULIN ASPART 100 UNIT/ML IJ SOLN
25.0000 [IU] | Freq: Once | INTRAMUSCULAR | Status: AC
  Administered 2021-07-08: 25 [IU] via SUBCUTANEOUS

## 2021-07-08 MED ORDER — INSULIN ASPART 100 UNIT/ML IJ SOLN
20.0000 [IU] | Freq: Once | INTRAMUSCULAR | Status: AC
  Administered 2021-07-08: 20 [IU] via SUBCUTANEOUS

## 2021-07-08 MED ORDER — INSULIN ASPART 100 UNIT/ML IJ SOLN
0.0000 [IU] | Freq: Every day | INTRAMUSCULAR | Status: DC
  Administered 2021-07-09: 3 [IU] via SUBCUTANEOUS

## 2021-07-08 NOTE — Evaluation (Signed)
Physical Therapy Evaluation Patient Details Name: Stephen Lynch MRN: [id number] DOB: December 03, 1945 Today's Date: 07/08/2021  History of Present Illness  76 y/o male admitted to the hospital with melena, acute blood loss anemia, AKI and metabolic acidosis. admission hemoglobin 5.6. He received 2 unit prbc and FFP and follow up hemoglobin 3.3. He received another 4 unit prbc. GI following. He underwent EGD that showed gastric ulcer s/p Epi, BICAP, and hemoclipping. PMH:  stage 4 prostate CA on chemo, IDDM.  Clinical Impression  Pt admitted with above diagnosis.  Pt well known to this PT from previous admission. Pt  is mildly confused but follows most one step commands given time and is cooperative. Now much weaker than last admission in April.  Pt will likely need SNF post acute, Palliative Care following. Will continue to update PT POC as needed.   VSS during PT session with SpO2=99-100% on 3L, BP 157/71, HR 80s to 90s, RR 18-25   Pt currently with functional limitations due to the deficits listed below (see PT Problem List). Pt will benefit from skilled PT to increase their independence and safety with mobility to allow discharge to the venue listed below.          Recommendations for follow up therapy are one component of a multi-disciplinary discharge planning process, led by the attending physician.  Recommendations may be updated based on patient status, additional functional criteria and insurance authorization.  Follow Up Recommendations Skilled nursing-short term rehab (<3 hours/day)    Assistance Recommended at Discharge Frequent or constant Supervision/Assistance  Patient can return home with the following  A little help with walking and/or transfers;Assist for transportation;Help with stairs or ramp for entrance;A little help with bathing/dressing/bathroom;Assistance with cooking/housework;Direct supervision/assist for financial management;Direct supervision/assist for medications  management    Equipment Recommendations None recommended by PT  Recommendations for Other Services       Functional Status Assessment Patient has had a recent decline in their functional status and demonstrates the ability to make significant improvements in function in a reasonable and predictable amount of time.     Precautions / Restrictions Precautions Precautions: Fall Restrictions Weight Bearing Restrictions: No      Mobility  Bed Mobility Overal bed mobility: Needs Assistance Bed Mobility: Supine to Sit     Supine to sit: Max assist, +2 for physical assistance, +2 for safety/equipment     General bed mobility comments: pt assists with trunk elevation, bed pad used for lateral scooting, assist to progress bil LEs off bed and complete trunk to upright. bed pad used to scoot to EOB    Transfers Overall transfer level: Needs assistance Equipment used: Rolling walker (2 wheels) Transfers: Sit to/from Stand, Bed to chair/wheelchair/BSC Sit to Stand: Min assist, +2 physical assistance, +2 safety/equipment   Step pivot transfers: Min assist, +2 safety/equipment       General transfer comment: cues for safety and hand placement, assist to rise and transition to RW. able to take a few steps fwd and back to chair with ~ min assist, +2 for lines/safety on pivot    Ambulation/Gait             Pre-gait activities: lateral wt shifting, marching in place General Gait Details: ~5' forward and back  Stairs            Wheelchair Mobility    Modified Rankin (Stroke Patients Only)       Balance Overall balance assessment: Needs assistance Sitting-balance support: Single extremity supported, No upper extremity  supported, Feet supported Sitting balance-Leahy Scale: Fair     Standing balance support: Reliant on assistive device for balance, During functional activity, Bilateral upper extremity supported Standing balance-Leahy Scale: Poor                                Pertinent Vitals/Pain Pain Assessment Pain Assessment: No/denies pain    Home Living Family/patient expects to be discharged to:: Private residence Living Arrangements: Spouse/significant other (Fiancee'-Ruth jackson)   Type of Home: House Home Access: Stairs to enter   Technical brewer of Steps: 1 Alternate Level Stairs-Number of Steps: 14 Home Layout: Two level;Bed/bath upstairs;1/2 bath on main level;Able to live on main level with bedroom/bathroom Home Equipment: Kasandra Knudsen - single point;Shower seat;Grab bars - Statistician (2 wheels) Additional Comments: info above taken from [employer] encounter~ 1 month ago    Prior Function Prior Level of Function : Independent/Modified Independent             Mobility Comments: amb with RW since last admission, prior to April pt was Independent community  ambulator       Journalist, newspaper        Extremity/Trunk Assessment   Upper Extremity Assessment Upper Extremity Assessment: Defer to OT evaluation    Lower Extremity Assessment Lower Extremity Assessment: Generalized weakness       Communication   Communication: No difficulties  Cognition Arousal/Alertness: Awake/alert Behavior During Therapy: Flat affect Overall Cognitive Status: Impaired/Different from baseline Area of Impairment: Orientation, Following commands, Problem solving, Safety/judgement                 Orientation Level: Disoriented to, Situation, Place (states he is at "[facility]")     Following Commands: Follows one step commands with increased time, Follows multi-step commands inconsistently Safety/Judgement: Decreased awareness of deficits   Problem Solving: Slow processing, Decreased initiation, Difficulty sequencing, Requires verbal cues, Requires tactile cues          General Comments      Exercises     Assessment/Plan    PT Assessment Patient needs continued PT services  PT Problem List  Decreased strength;Decreased mobility;Decreased safety awareness;Decreased activity tolerance;Decreased balance;Decreased knowledge of use of DME;Decreased cognition       PT Treatment Interventions DME instruction;Therapeutic exercise;Gait training;Therapeutic activities;Functional mobility training;Patient/family education;Balance training    PT Goals (Current goals can be found in the Care Plan section)  Acute Rehab PT Goals Patient Stated Goal: wants to go home PT Goal Formulation: With patient Time For Goal Achievement: 07/22/21 Potential to Achieve Goals: Fair    Frequency Min 2X/week     Co-evaluation               AM-PAC PT "6 Clicks" Mobility  Outcome Measure Help needed turning from your back to your side while in a flat bed without using bedrails?: A Lot Help needed moving from lying on your back to sitting on the side of a flat bed without using bedrails?: Total Help needed moving to and from a bed to a chair (including a wheelchair)?: A Lot Help needed standing up from a chair using your arms (e.g., wheelchair or bedside chair)?: A Lot Help needed to walk in hospital room?: Total Help needed climbing 3-5 steps with a railing? : Total 6 Click Score: 9    End of Session   Activity Tolerance: Patient tolerated treatment well Patient left: in chair;with call bell/phone within reach;with chair alarm set  Nurse Communication: Mobility status PT Visit Diagnosis: Other abnormalities of gait and mobility (R26.89);Difficulty in walking, not elsewhere classified (R26.2);Muscle weakness (generalized) (M62.81)    Time: 1031-1050 PT Time Calculation (min) (ACUTE ONLY): 19 min   Charges:   PT Evaluation $PT Eval Low Complexity: [place], PT  Acute Rehab Dept ([place]) [phone] Pager [phone]  07/08/2021   [facility] 07/08/2021, 12:04 PM

## 2021-07-08 NOTE — Progress Notes (Signed)
Subjective: No acute events.  He passed a tarry bowel movement 45 minutes earlier.  Objective: Vital signs in last 24 hours: Temp:  [97.4 F (36.3 C)-99 F (37.2 C)] 98 F (36.7 C) (05/28 1110) Pulse Rate:  [75-93] 92 (05/28 1113) Resp:  [10-20] 14 (05/28 0900) BP: (134-176)/(48-103) 173/78 (05/28 1113) SpO2:  [96 %-100 %] 100 % (05/28 0900) Last BM Date : 07/07/21  Intake/Output from previous day: 05/27 0701 - 05/28 0700 In: 1458.2 [P.O.:717; I.V.:128.4; Blood:315; IV Piggyback:297.8] Out: 2350 [Urine:2350] Intake/Output this shift: Total I/O In: 575.2 [I.V.:145.2; IV Piggyback:430] Out: -   General appearance: no distress and somnolent, weak appearing GI: soft, non-tender; bowel sounds normal; no masses,  no organomegaly  Lab Results: Recent Labs    07/07/21 1507 07/07/21 2227 07/08/21 0448  WBC 18.0* 19.3* 15.2*  HGB 6.5* 9.3* 8.3*  HCT 19.1* 27.4* 24.4*  PLT 71* 83* 70*   BMET Recent Labs    07/06/21 0635 07/07/21 0500 07/08/21 0448  NA 144 145 137  K 3.9 3.8 3.6  CL 116* 116* 109  CO2 22 23 21*  GLUCOSE 185* 183* 354*  BUN 37* 33* 25*  CREATININE 1.17 1.12 0.92  CALCIUM 7.4* 7.7* 7.7*   LFT Recent Labs    07/06/21 0815 07/07/21 0500 07/08/21 0448  PROT 4.5*   < > 4.6*  ALBUMIN 2.5*   < > 2.6*  AST 132*   < > 42*  ALT 34   < > 23  ALKPHOS 284*   < > 221*  BILITOT 1.0   < > 1.0  BILIDIR 0.3*  --   --   IBILI 0.7  --   --    < > = values in this interval not displayed.   PT/INR Recent Labs    07/06/21 0635 07/07/21 0500  LABPROT 21.0* 18.1*  INR 1.8* 1.5*   Hepatitis Panel No results for input(s): HEPBSAG, HCVAB, HEPAIGM, HEPBIGM in the last 72 hours. C-Diff No results for input(s): CDIFFTOX in the last 72 hours. Fecal Lactopherrin No results for input(s): FECLLACTOFRN in the last 72 hours.  Studies/Results: DG CHEST PORT 1 VIEW  Result Date: 07/07/2021 CLINICAL DATA:  Shortness of breath. EXAM: PORTABLE CHEST 1 VIEW COMPARISON:   May 25, 23. FINDINGS: No consolidation. Low lung volumes. No visible pleural effusions or pneumothorax. Cardiomediastinal silhouette is similar to prior and within normal limits. Similar position of right IJ approach Port-A-Cath with the tip projecting at the SVC. Similar diffuse osseous sclerosis. IMPRESSION: No evidence of acute cardiopulmonary disease. Electronically Signed   By: Margaretha Sheffield M.D.   On: 07/07/2021 10:42    Medications: Scheduled:  feeding supplement  1 Container Oral BID BM   feeding supplement  237 mL Oral Q24H   insulin aspart  0-20 Units Subcutaneous TID WC   insulin aspart  0-5 Units Subcutaneous QHS   insulin aspart  20 Units Subcutaneous Once   insulin glargine-yfgn  20 Units Subcutaneous Daily   mouth rinse  15 mL Mouth Rinse BID   megestrol  400 mg Oral BID   metoprolol tartrate  25 mg Oral BID   multivitamin with minerals  1 tablet Oral Daily   [START ON 07/09/2021] pantoprazole  40 mg Intravenous Q12H   rosuvastatin  10 mg Oral Daily   sodium chloride flush  10-40 mL Intracatheter Q12H   sucralfate  1 g Oral TID WC & HS   Continuous:  pantoprazole 8 mg/hr (07/08/21 0919)    Assessment/Plan:  1) Gastric ulcer. 2) Anemia.   The patient is stable.  His HGB is relatively stable.  The tarry stool is felt to be from old blood passing.  Plan: 1) Continue to follow HGB. 2) Continue with pantoprazole and sucralfate.  LOS: 3 days   Ravan Schlemmer D 07/08/2021, 1:01 PM

## 2021-07-08 NOTE — Progress Notes (Signed)
PROGRESS NOTE    Stephen Lynch  AYT:[id number] DOB: 26-Feb-1945 DOA: 07/05/2021 PCP: [facility]    Brief Narrative:  76 y/o male with history of stage 4 prostate CA on chemo, IDDM, admitted to the hospital with melena, acute blood loss anemia, AKI and metabolic acidosis. He was also noted to be coagulopathic with INR of 2.1, no reported history of cirrhosis. Baseline hemoglobin noted to be 10.7 on 5/16, admission hemoglobin 5.6. He received 2 unit prbc and FFP and follow up hemoglobin 3.3. He received another 4 unit prbc. GI following. He underwent EGD that showed gastric ulcer s/p Epi, BICAP, and hemoclipping..   Assessment & Plan:   Principal Problem:   Acute blood loss anemia Active Problems:   Dyslipidemia   Diabetes mellitus without complication (HCC)   Goals of care, counseling/discussion   Uncontrolled type 2 diabetes mellitus with hyperglycemia, with long-term current use of insulin (HCC)   Malignant neoplasm of prostate metastatic to bone (HCC)   Hypoalbuminemia due to protein-calorie malnutrition (HCC)   AKI (acute kidney injury) ([place])   Melena   Leukocytosis   Metabolic acidosis   Coagulopathy (HCC)   Metastases to the liver (HCC)   Thrombocytopenia (HCC)   Acute blood loss anemia -Patient's hemoglobin was 10.7 on 5/16 -Admission hemoglobin noted to be 5.6 -2 units PRBC were transfused on admission -follow up hemoglobin 3.3 (confirmed on recheck per staff) -He has since received another 5 unit prbc for a total of 7 units thus far -continue to follow   Melena secondary to bleeding gastric ulcer -GI following -Continue with volume resuscitation -No history of NSAIDs -Continue on Protonix infusion -EGD on 5/26: gastric ulcer s/p Epi, BICAP, and hemoclipping. -continue PPI infusion through the weekend -continue carafate -clear liquids, advance as tolerated -he has had continued dark stools, although this may be old blood -BUN has been trending  down  Leukocytosis -Suspect this is stress response from rapid GI bleed -Since he appears to be having shivering and is immunocompromised from chemotherapy, he was covered with broad-spectrum antibiotics -Chest x-ray without evidence of pneumonia -WBC 48.2 -> 15.2 -he has been afebrile and does not appear toxic -blood culture/urine culture without any growth to date -will DC antibiotics  Thrombocytopenia -possibly related to consumption from large GI bleed -continue to follow   Coagulopathy -Etiology is unclear but possibly related to liver mets  -Admission INR 2.1 -No reported history of cirrhosis -RUQ ultrasound did not show liver changes consistent with cirrhosis, portal vein was patent -CT abdomen comments on multiple possible liver mets -Received vitamin K/FFP 5/25 -current INR 1.5   Acute kidney injury -Likely related to volume depletion/anemia -creatinine 1.4 on admission -creatinine improved with IV fluids, currently 0.9 -follow urine output   Normal anion gap metabolic acidosis -Likely related to vomiting/GI losses -resolved with bicarbonate infusion   Hypoalbuminemia -Suspect related to poor nutritional status -Request nutrition to follow -continue megace   Stage IV prostate cancer with mets to bone -CT abd shows possible liver mets -Followed by Dr. Alen Blew, current treatment is palliative -Dr. Alen Blew added to treatment team.   Diabetes mellitus type 2, insulin-dependent, uncontrolled with hyperglycemia -restart basal insulin -Cover with sliding scale insulin  Hyperlipidemia -continue statin  Generalized weakness PT/OT   Goals of care -Followed by palliative care as an outpatient -I had a lengthy discussion with patient and his fiance, [name] -[name] reports that she is patient's power of attorney and will bring in appropriate paperwork -We discussed  his current medical comorbidities and expected prognosis in the event of a cardiopulmonary  arrest -After discussion, patient agrees with DNR status at this point. -palliative care consulted and following in the hospital   DVT prophylaxis: SCDs Start: 07/05/21 1631  Code Status: DNR Family Communication: updated patient fiance Ms. Stephen Lynch Disposition Plan: Status is: Inpatient Remains inpatient appropriate because: continued management/work up of GI bleeding and associated anemia     Consultants:  GI Palliative care  Procedures:  5/26 EGD: gastric ulcer s/p Epi, BICAP, and hemoclipping.  Antimicrobials:  Vancomycin 5/25>5/28 Cefepime 5/25> 5/28   Subjective: No BM overnight. He had a dark tarry stool yesterday after which hemoglobin was noted to be lower at 6.5. He received one unit prbc. Had another dark tarry stool today. No vomiting.  Objective: Vitals:   07/08/21 0700 07/08/21 0754 07/08/21 0800 07/08/21 0900  BP: (!) 146/78  (!) 157/63 (!) 176/103  Pulse: 75  79 82  Resp: '15  14 14  '$ Temp:  99 F (37.2 C)    TempSrc:  Oral    SpO2: 96%  100% 100%  Weight:      Height:        Intake/Output Summary (Last 24 hours) at 07/08/2021 1035 Last data filed at 07/08/2021 0919 Gross per 24 hour  Intake 1899.2 ml  Output 2150 ml  Net -250.8 ml   Filed Weights   07/05/21 1620  Weight: 87.5 kg    Examination:  General exam: Appears calm and comfortable  Respiratory system: clear bilaterally. Respiratory effort normal. Cardiovascular system: S1 & S2 heard, RRR. No JVD, murmurs, rubs, gallops or clicks.  Gastrointestinal system: Abdomen is nondistended, soft and nontender. No organomegaly or masses felt. Normal bowel sounds heard. Central nervous system: Alert and oriented. No focal neurological deficits. Extremities: 1+ edema bilaterally Skin: No rashes, lesions or ulcers Psychiatry: Judgement and insight appear normal. Mood & affect appropriate.     Data Reviewed: I have personally reviewed following labs and imaging studies  CBC: Recent Labs   Lab 07/05/21 1229 07/05/21 1236 07/06/21 2109 07/07/21 0500 07/07/21 1507 07/07/21 2227 07/08/21 0448  WBC 48.2*   < > 29.3* 23.8* 18.0* 19.3* 15.2*  NEUTROABS 35.7*  --   --   --   --   --   --   HGB 5.6*   < > 9.4* 8.4* 6.5* 9.3* 8.3*  HCT 18.2*   < > 27.3* 24.2* 19.1* 27.4* 24.4*  MCV 102.2*   < > 88.3 88.0 91.0 90.1 89.7  PLT 189   < > 90* 89* 71* 83* 70*   < > = values in this interval not displayed.   Basic Metabolic Panel: Recent Labs  Lab 07/05/21 1236 07/05/21 1413 07/06/21 0635 07/07/21 0500 07/08/21 0448  NA 142 144 144 145 137  K 4.2 4.3 3.9 3.8 3.6  CL 112* 115* 116* 116* 109  CO2  --  12* 22 23 21*  GLUCOSE 193* 177* 185* 183* 354*  BUN 29* 34* 37* 33* 25*  CREATININE 1.20 1.43* 1.17 1.12 0.92  CALCIUM  --  8.1* 7.4* 7.7* 7.7*   GFR: Estimated Creatinine Clearance: 79.6 mL/min (by C-G formula based on SCr of 0.92 mg/dL). Liver Function Tests: Recent Labs  Lab 07/05/21 1413 07/06/21 0815 07/07/21 0500 07/08/21 0448  AST 55* 132* 81* 42*  ALT 23 34 32 23  ALKPHOS 379* 284* 278* 221*  BILITOT 1.0 1.0 1.6* 1.0  PROT 4.5* 4.5* 4.4* 4.6*  ALBUMIN  2.0* 2.5* 2.3* 2.6*   No results for input(s): LIPASE, AMYLASE in the last 168 hours. Recent Labs  Lab 07/05/21 1229 07/06/21 0815 07/07/21 0514 07/08/21 0540  AMMONIA 73* 58* 54* 52*   Coagulation Profile: Recent Labs  Lab 07/05/21 1229 07/06/21 0635 07/07/21 0500  INR 2.1* 1.8* 1.5*   Cardiac Enzymes: No results for input(s): CKTOTAL, CKMB, CKMBINDEX, TROPONINI in the last 168 hours. BNP (last 3 results) No results for input(s): PROBNP in the last 8760 hours. HbA1C: No results for input(s): HGBA1C in the last 72 hours. CBG: Recent Labs  Lab 07/07/21 0736 07/07/21 1129 07/07/21 1710 07/07/21 2122 07/08/21 0757  GLUCAP 162* 274* 383* 372* 345*   Lipid Profile: No results for input(s): CHOL, HDL, LDLCALC, TRIG, CHOLHDL, LDLDIRECT in the last 72 hours. Thyroid Function Tests: No  results for input(s): TSH, T4TOTAL, FREET4, T3FREE, THYROIDAB in the last 72 hours. Anemia Panel: No results for input(s): VITAMINB12, FOLATE, FERRITIN, TIBC, IRON, RETICCTPCT in the last 72 hours. Sepsis Labs: No results for input(s): PROCALCITON, LATICACIDVEN in the last 168 hours.  Recent Results (from the past 240 hour(s))  Culture, blood (Routine X 2) w Reflex to ID Panel     Status: None (Preliminary result)   Collection Time: 07/05/21  5:05 PM   Specimen: BLOOD  Result Value Ref Range Status   Specimen Description   Final    BLOOD RIGHT ANTECUBITAL Performed at [facility] [address] [place], [place] [address]    Special Requests   Final    IN PEDIATRIC BOTTLE Blood Culture adequate volume Performed at [facility] [address], [place], [place] [address]    Culture   Final    NO GROWTH 2 DAYS Performed at [facility] [address], [place], [place] [address]    Report Status PENDING  Incomplete  Urine Culture     Status: Abnormal   Collection Time: 07/05/21  7:24 PM   Specimen: Urine, Clean Catch  Result Value Ref Range Status   Specimen Description   Final    URINE, CLEAN CATCH Performed at [facility], [place] [address], [place], [place] [address]    Special Requests   Final    NONE Performed at [facility], [place] [address] [place], [place] [address]    Culture (A)  Final    <10,000 COLONIES/mL INSIGNIFICANT GROWTH Performed at [facility] [address], [place], [place] [address]    Report Status 07/06/2021 FINAL  Final         Radiology Studies: DG CHEST PORT 1 VIEW  Result Date: 07/07/2021 CLINICAL DATA:  Shortness of breath. EXAM: PORTABLE CHEST 1 VIEW COMPARISON:  May 25, 23. FINDINGS: No consolidation. Low lung volumes. No visible pleural effusions or pneumothorax. Cardiomediastinal silhouette is similar to prior and within normal limits. Similar  position of right IJ approach Port-A-Cath with the tip projecting at the SVC. Similar diffuse osseous sclerosis. IMPRESSION: No evidence of acute cardiopulmonary disease. Electronically Signed   By: Margaretha Sheffield M.D.   On: 07/07/2021 10:42   CT Angio Abd/Pel w/ and/or w/o  Result Date: 07/06/2021 CLINICAL DATA:  GI bleed and anemia with both melena and hematochezia. History metastatic prostate carcinoma. EXAM: CT ANGIOGRAPHY ABDOMEN AND PELVIS WITH CONTRAST AND WITHOUT CONTRAST TECHNIQUE: Multidetector CT imaging of the abdomen and pelvis was performed using the standard protocol during bolus administration of intravenous contrast. Multiplanar reconstructed images and MIPs  were obtained and reviewed to evaluate the vascular anatomy. RADIATION DOSE REDUCTION: This exam was performed according to the departmental dose-optimization program which includes automated exposure control, adjustment of the mA and/or kV according to patient size and/or use of iterative reconstruction technique. CONTRAST:  163m OMNIPAQUE IOHEXOL 350 MG/ML SOLN COMPARISON:  PET scan on 11/14/2020 FINDINGS: All phases of the study are limited by patient motion artifact. VASCULAR Aorta: Normally patent. No significant atherosclerosis with minimal calcified plaque in the distal abdominal aorta. No aneurysm or dissection. Celiac: Normally patent.  Normally patent branch vessels. SMA: Normally patent. Replaced right hepatic artery off of the proximal SMA demonstrates normal patency. Renals: Normally patent bilateral single renal arteries. IMA: Normally patent. Inflow: Normally patent bilateral iliac arteries. Proximal Outflow: Calcified plaque in bilateral common femoral arteries without significant stenosis. Normally patent bilateral femoral bifurcations. Veins: Venous phase imaging demonstrates normal patency of major venous structures including the IVC, renal veins, hepatic veins, portal vein, splenic vein, mesenteric veins, iliac veins  and common femoral veins. Review of the MIP images confirms the above findings. NON-VASCULAR Lower chest: No acute abnormality. Hepatobiliary: New multifocal areas of low density in both right and left lobes of the liver since 2022 with the largest measuring approximately 3 cm in the inferior right lobe. Findings are consistent with metastatic disease. The gallbladder is unremarkable and there is no biliary ductal dilatation. Pancreas: Atrophic pancreas with stable chronic diffuse ductal dilatation and prominence. Spleen: Normal in size without focal abnormality. Adrenals/Urinary Tract: Adrenal glands are unremarkable. Kidneys are normal, without renal calculi, focal lesion, or hydronephrosis. Bladder is unremarkable. Stomach/Bowel: There is a suggestion of a focus increased density in a rounded configuration within the distal stomach on the arterial phase of imaging which is not present on the pre contrast images. This does not increased significantly in size on the venous phase but is suspicious for a focus of acute hemorrhage in the region of the gastric antrum. No overt ulceration or associated mass identified by CT. No other regions hemorrhage identified within the gastrointestinal tract by CTA. Lymphatic: No enlarged abdominal or pelvic lymph nodes. Reproductive: No prostate enlargement. Other: No abdominal wall hernia or abnormality. No abdominopelvic ascites. Musculoskeletal: Diffuse skeletal metastatic disease again known from known widespread metastatic prostate carcinoma. Bony disease is largely sclerotic with focal areas lytic disease also present. No vertebral body collapse or visible pathologic fractures. IMPRESSION: 1. Small focus of probable contrast extravasation on the arterial phase of imaging in the antrum of the stomach is suspicious for focal bleeding. This is not evident on the unenhanced images and does not increased significantly on the venous phase. No obvious associated ulceration or mass  by CT. 2. New multiple lesions in both right and left lobes of the liver are consistent with hepatic metastatic disease. 3. Stable diffuse skeletal metastatic disease from known metastatic prostate carcinoma. 4. These results will be called to the ordering clinician or representative by the Radiologist Assistant, and communication documented in the PACS or [employer] Electronically Signed   By: GAletta EdouardM.D.   On: 07/06/2021 11:52        Scheduled Meds:  feeding supplement  1 Container Oral BID BM   feeding supplement  237 mL Oral Q24H   insulin aspart  0-5 Units Subcutaneous QHS   insulin aspart  0-9 Units Subcutaneous TID WC   insulin glargine-yfgn  20 Units Subcutaneous Daily   mouth rinse  15 mL Mouth Rinse BID   megestrol  400  mg Oral BID   multivitamin with minerals  1 tablet Oral Daily   [START ON 07/09/2021] pantoprazole  40 mg Intravenous Q12H   rosuvastatin  10 mg Oral Daily   sodium chloride flush  10-40 mL Intracatheter Q12H   sucralfate  1 g Oral TID WC & HS   Continuous Infusions:  ceFEPime (MAXIPIME) IV Stopped (07/08/21 0906)   pantoprazole 8 mg/hr (07/08/21 0919)   vancomycin Stopped (07/07/21 1956)     LOS: 3 days    Time spent: 51mns    JKathie Dike MD [facility]   If 7PM-7AM, please contact night-coverage [email or web address]  07/08/2021, 10:35 AM

## 2021-07-08 NOTE — Evaluation (Signed)
Occupational Therapy Evaluation Patient Details Name: Stephen Lynch MRN: [id number] DOB: 1945/09/24 Today's Date: 07/08/2021   History of Present Illness 76 y/o male admitted to the hospital with melena, acute blood loss anemia, AKI and metabolic acidosis. admission hemoglobin 5.6. He received 2 unit prbc and FFP and follow up hemoglobin 3.3. He received another 4 unit prbc. GI following. He underwent EGD that showed gastric ulcer s/p Epi, BICAP, and hemoclipping. PMH:  stage 4 prostate CA on chemo, IDDM.   Clinical Impression   Patient is a 76 year old male who was admitted for above. Patient was living at home with supervision from family prior level. Patient was TD for bed mobility and min A for washing face and hands sitting on edge of bed with increased time.this is a significant decline from patients ADL status at time of d/c from hospital 06/01/21. Patient was noted to have decreased functional activity tolernace, decreased ROM, decreased BUE strength, decreased endurance, decreased sitting balance, decreased standing balanced, decreased safety awareness, and decreased knowledge of AE/AD impacting participation in ADLs. Patient would continue to benefit from skilled OT services at this time while admitted and after d/c to address noted deficits in order to improve overall safety and independence in ADLs.       Recommendations for follow up therapy are one component of a multi-disciplinary discharge planning process, led by the attending physician.  Recommendations may be updated based on patient status, additional functional criteria and insurance authorization.   Follow Up Recommendations  Skilled nursing-short term rehab (<3 hours/day)    Assistance Recommended at Discharge Frequent or constant Supervision/Assistance  Patient can return home with the following Two people to help with walking and/or transfers;Two people to help with bathing/dressing/bathroom;Help with stairs or ramp for  entrance;Direct supervision/assist for medications management;Assist for transportation;Direct supervision/assist for financial management;Assistance with cooking/housework    Functional Status Assessment  Patient has had a recent decline in their functional status and demonstrates the ability to make significant improvements in function in a reasonable and predictable amount of time.  Equipment Recommendations  None recommended by OT    Recommendations for Other Services       Precautions / Restrictions Precautions Precautions: Fall Restrictions Weight Bearing Restrictions: No      Mobility Bed Mobility Overal bed mobility: Needs Assistance Bed Mobility: Supine to Sit, Sit to Supine     Supine to sit: Total assist, HOB elevated Sit to supine: Max assist, +2 for safety/equipment, +2 for physical assistance   General bed mobility comments: pt assists with trunk elevation, bed pad used for lateral scooting, assist to progress bil LEs off bed and complete trunk to upright. bed pad used to scoot to EOB    Transfers                          Balance Overall balance assessment: Needs assistance Sitting-balance support: Single extremity supported, No upper extremity supported, Feet supported Sitting balance-Leahy Scale: Fair         Standing balance comment: did not test for safety.                           ADL either performed or assessed with clinical judgement   ADL Overall ADL's : Needs assistance/impaired   Eating/Feeding Details (indicate cue type and reason): clear liquids only at this time. patient requesting food. nurse made aware. Grooming: Wash/dry face;Minimal assistance;Sitting Grooming Details (indicate cue  type and reason): EOB with increased time Upper Body Bathing: Moderate assistance;Bed level   Lower Body Bathing: Maximal assistance;Bed level   Upper Body Dressing : Moderate assistance;Bed level   Lower Body Dressing: Maximal  assistance;Bed level   Toilet Transfer: +2 for physical assistance;+2 for safety/equipment Toilet Transfer Details (indicate cue type and reason): patient was max A for supine to sit on edge of bed with increased difficulty with moving BLE to edge of bed not present during last admission. Toileting- Clothing Manipulation and Hygiene: Maximal assistance;Bed level       Functional mobility during ADLs: +2 for physical assistance;+2 for safety/equipment       Vision Patient Visual Report: No change from baseline       Perception     Praxis      Pertinent Vitals/Pain Pain Assessment Pain Assessment: Faces Faces Pain Scale: Hurts a little bit Pain Location: L forearm red, edema and hot, infiltrated IV site per nurse Pain Descriptors / Indicators: Discomfort, Grimacing Pain Intervention(s): Monitored during session, Repositioned     Hand Dominance Right   Extremity/Trunk Assessment Upper Extremity Assessment Upper Extremity Assessment: Overall WFL for tasks assessed;Generalized weakness;LUE deficits/detail LUE Deficits / Details: anterior forearm proximal to elbow noted to have increased edema, redness and heat. nurse aware IV had infiltrated earlier this AM   Lower Extremity Assessment Lower Extremity Assessment: Defer to PT evaluation   Cervical / Trunk Assessment Cervical / Trunk Assessment: Normal   Communication Communication Communication: No difficulties   Cognition Arousal/Alertness: Awake/alert Behavior During Therapy: Flat affect Overall Cognitive Status: Impaired/Different from baseline Area of Impairment: Orientation, Following commands, Problem solving, Safety/judgement                 Orientation Level: Disoriented to, Situation, Place     Following Commands: Follows one step commands with increased time, Follows multi-step commands inconsistently Safety/Judgement: Decreased awareness of deficits   Problem Solving: Slow processing, Decreased  initiation, Difficulty sequencing, Requires verbal cues, Requires tactile cues       General Comments       Exercises     Shoulder Instructions      Home Living Family/patient expects to be discharged to:: Private residence Living Arrangements: Spouse/significant other Available Help at Discharge: Available 24 hours/day Type of Home: House Home Access: Stairs to enter [employer] of Steps: 1 Entrance Stairs-Rails: None Home Layout: Two level;Bed/bath upstairs;1/2 bath on main level;Able to live on main level with bedroom/bathroom Alternate Level Stairs-Number of Steps: 14 Alternate Level Stairs-Rails: Right Bathroom Shower/Tub: Teacher, early years/pre: Standard Bathroom Accessibility: Yes   Home Equipment: Cane - single point;Shower seat;Grab bars - Statistician (2 wheels)   Additional Comments: info above taken from recnet encounter~ 1 month ago      Prior Functioning/Environment Prior Level of Function : Independent/Modified Independent             Mobility Comments: amb with RW since last admission, prior to April pt was Independent community  ambulator ADLs Comments: Reports independent with ADLs (uses shower chair); Reports could do IADLs. PLOF was taken from chart        OT Problem List: Decreased activity tolerance;Impaired balance (sitting and/or standing);Decreased safety awareness;Pain;Decreased knowledge of precautions;Decreased knowledge of use of DME or AE;Cardiopulmonary status limiting activity      OT Treatment/Interventions: Self-care/ADL training;Therapeutic exercise;Neuromuscular education;Energy conservation;DME and/or AE instruction;Therapeutic activities;Balance training;Patient/family education    OT Goals(Current goals can be found in the care plan section) Acute Rehab  OT Goals Patient Stated Goal: to get better OT Goal Formulation: With patient Time For Goal Achievement: 07/22/21 Potential to Achieve  Goals: Good  OT Frequency: Min 2X/week    Co-evaluation              AM-PAC OT "6 Clicks" Daily Activity     Outcome Measure Help from another person eating meals?: A Lot Help from another person taking care of personal grooming?: A Lot Help from another person toileting, which includes using toliet, bedpan, or urinal?: Total Help from another person bathing (including washing, rinsing, drying)?: Total Help from another person to put on and taking off regular upper body clothing?: A Lot Help from another person to put on and taking off regular lower body clothing?: Total 6 Click Score: 9   End of Session Nurse Communication: Other (comment) (redness in LUE and requests for food)  Activity Tolerance: Patient tolerated treatment well Patient left: in bed;with call bell/phone within reach  OT Visit Diagnosis: Unsteadiness on feet (R26.81);Other abnormalities of gait and mobility (R26.89);Muscle weakness (generalized) (M62.81);Pain                Time: 1430-1458 OT Time Calculation (min): 28 min Charges:  OT General Charges $OT Visit: 1 Visit OT Evaluation $OT Eval Moderate Complexity: 1 Mod OT Treatments $Self Care/Home Management : 8-22 mins  Jackelyn Poling OTR/L, MS Acute Rehabilitation Department Office# [phone] Pager# [phone]   Marcellina Millin 07/08/2021, 3:59 PM

## 2021-07-08 NOTE — Plan of Care (Signed)
  Problem: Coping: Goal: Level of anxiety will decrease Outcome: Progressing   Problem: Pain Managment: Goal: General experience of comfort will improve Outcome: Progressing   

## 2021-07-08 NOTE — Progress Notes (Signed)
Pt had a large tarry BM with almost no solid pieces. Pt was asymptomatic and got up with PT to the chair. MD was notified of BM and CBC was collected.

## 2021-07-09 DIAGNOSIS — D62 Acute posthemorrhagic anemia: Secondary | ICD-10-CM | POA: Diagnosis not present

## 2021-07-09 DIAGNOSIS — D689 Coagulation defect, unspecified: Secondary | ICD-10-CM | POA: Diagnosis not present

## 2021-07-09 DIAGNOSIS — N179 Acute kidney failure, unspecified: Secondary | ICD-10-CM | POA: Diagnosis not present

## 2021-07-09 DIAGNOSIS — E119 Type 2 diabetes mellitus without complications: Secondary | ICD-10-CM | POA: Diagnosis not present

## 2021-07-09 LAB — COMPREHENSIVE METABOLIC PANEL
ALT: 26 U/L (ref 0–44)
AST: 47 U/L — ABNORMAL HIGH (ref 15–41)
Albumin: 2.4 g/dL — ABNORMAL LOW (ref 3.5–5.0)
Alkaline Phosphatase: 243 U/L — ABNORMAL HIGH (ref 38–126)
Anion gap: 4 — ABNORMAL LOW (ref 5–15)
BUN: 18 mg/dL (ref 8–23)
CO2: 23 mmol/L (ref 22–32)
Calcium: 8 mg/dL — ABNORMAL LOW (ref 8.9–10.3)
Chloride: 115 mmol/L — ABNORMAL HIGH (ref 98–111)
Creatinine, Ser: 0.79 mg/dL (ref 0.61–1.24)
GFR, Estimated: >60 mL/min (ref >60)
Glucose, Bld: 103 mg/dL — ABNORMAL HIGH (ref 70–99)
Potassium: 3.4 mmol/L — ABNORMAL LOW (ref 3.5–5.1)
Sodium: 142 mmol/L (ref 135–145)
Total Bilirubin: 1.2 mg/dL (ref 0.3–1.2)
Total Protein: 4.5 g/dL — ABNORMAL LOW (ref 6.5–8.1)

## 2021-07-09 LAB — BPAM RBC
Blood Product Expiration Date: 202306012359
Blood Product Expiration Date: 202306072359
Blood Product Expiration Date: 202306082359
Blood Product Expiration Date: 202306182359
Blood Product Expiration Date: 202306182359
Blood Product Expiration Date: 202306182359
Blood Product Expiration Date: 202306222359
Blood Product Expiration Date: 202306242359
Blood Product Expiration Date: 202306242359
ISSUE DATE / TIME: 202305251413
ISSUE DATE / TIME: 202305251742
ISSUE DATE / TIME: 202305260815
ISSUE DATE / TIME: 202305261115
ISSUE DATE / TIME: 202305261358
ISSUE DATE / TIME: 202305261618
ISSUE DATE / TIME: 202305271719
Unit Type and Rh: 600
Unit Type and Rh: 600
Unit Type and Rh: 600
Unit Type and Rh: 6200
Unit Type and Rh: 6200
Unit Type and Rh: 6200
Unit Type and Rh: 6200
Unit Type and Rh: 6200
Unit Type and Rh: 6200

## 2021-07-09 LAB — CBC
HCT: 27.4 % — ABNORMAL LOW (ref 39.0–52.0)
Hemoglobin: 9.2 g/dL — ABNORMAL LOW (ref 13.0–17.0)
MCH: 30.5 pg (ref 26.0–34.0)
MCHC: 33.6 g/dL (ref 30.0–36.0)
MCV: 90.7 fL (ref 80.0–100.0)
Platelets: 74 10*3/uL — ABNORMAL LOW (ref 150–400)
RBC: 3.02 MIL/uL — ABNORMAL LOW (ref 4.22–5.81)
RDW: 21.2 % — ABNORMAL HIGH (ref 11.5–15.5)
WBC: 14.6 10*3/uL — ABNORMAL HIGH (ref 4.0–10.5)
nRBC: 1 % — ABNORMAL HIGH (ref 0.0–0.2)

## 2021-07-09 LAB — TYPE AND SCREEN
ABO/RH(D): AB POS
Antibody Screen: NEGATIVE
Unit division: 0
Unit division: 0
Unit division: 0
Unit division: 0
Unit division: 0
Unit division: 0
Unit division: 0
Unit division: 0
Unit division: 0

## 2021-07-09 LAB — GLUCOSE, CAPILLARY
Glucose-Capillary: 120 mg/dL — ABNORMAL HIGH (ref 70–99)
Glucose-Capillary: 205 mg/dL — ABNORMAL HIGH (ref 70–99)
Glucose-Capillary: 310 mg/dL — ABNORMAL HIGH (ref 70–99)
Glucose-Capillary: 265 mg/dL — ABNORMAL HIGH (ref 70–99)

## 2021-07-09 MED ORDER — PANTOPRAZOLE SODIUM 40 MG PO TBEC
40.0000 mg | DELAYED_RELEASE_TABLET | Freq: Two times a day (BID) | ORAL | Status: DC
  Administered 2021-07-09 – 2021-07-10 (×3): 40 mg via ORAL
  Filled 2021-07-09 (×3): qty 1

## 2021-07-09 MED ORDER — POTASSIUM CHLORIDE CRYS ER 20 MEQ PO TBCR
40.0000 meq | EXTENDED_RELEASE_TABLET | Freq: Once | ORAL | Status: AC
  Administered 2021-07-09: 40 meq via ORAL
  Filled 2021-07-09: qty 2

## 2021-07-09 NOTE — Progress Notes (Signed)
Physical Therapy Treatment Patient Details Name: Stephen Lynch MRN: [id number] DOB: 06-10-1945 Today's Date: 07/09/2021   History of Present Illness 76 y/o male admitted to the hospital with melena, acute blood loss anemia, AKI and metabolic acidosis. admission hemoglobin 5.6. He received 2 unit prbc and FFP and follow up hemoglobin 3.3. He received another 4 unit prbc. GI following. He underwent EGD that showed gastric ulcer s/p Epi, BICAP, and hemoclipping. PMH: stage 4 prostate CA on chemo, IDDM.    PT Comments    Pt assisted OOB and able to march in place and perform step pivot transfer to recliner however fatigued quickly.  Pt also performed a couple exercises in recliner.  Continue to recommend SNF upon d/c.  Pt very pleasant and motivated to participate however does require multimodal cues and increased time to process.     Recommendations for follow up therapy are one component of a multi-disciplinary discharge planning process, led by the attending physician.  Recommendations may be updated based on patient status, additional functional criteria and insurance authorization.  Follow Up Recommendations  Skilled nursing-short term rehab (<3 hours/day)     Assistance Recommended at Discharge Frequent or constant Supervision/Assistance  Patient can return home with the following A little help with walking and/or transfers;Assist for transportation;Help with stairs or ramp for entrance;A little help with bathing/dressing/bathroom;Assistance with cooking/housework;Direct supervision/assist for financial management;Direct supervision/assist for medications management   Equipment Recommendations  None recommended by PT    Recommendations for Other Services       Precautions / Restrictions Precautions Precautions: Fall     Mobility  Bed Mobility Overal bed mobility: Needs Assistance Bed Mobility: Supine to Sit     Supine to sit: HOB elevated, Max assist     General bed  mobility comments: cues for self assist with bed rail, required assist for Left LE over EOB, scooting hips forward, and trunk upright    Transfers Overall transfer level: Needs assistance Equipment used: Rolling walker (2 wheels) Transfers: Sit to/from Stand, Bed to chair/wheelchair/BSC Sit to Stand: Min assist   Step pivot transfers: Min assist       General transfer comment: cues for safety and hand placement, assist to rise and transition to RW. able to take a few steps fwd and back to chair with ~ min assist, also performed marching in place briefly in front of recliner    Ambulation/Gait               General Gait Details: deferred for safety and pt with limited endurance   Stairs             Wheelchair Mobility    Modified Rankin (Stroke Patients Only)       Balance                                            Cognition Arousal/Alertness: Awake/alert Behavior During Therapy: Flat affect Overall Cognitive Status: Impaired/Different from baseline Area of Impairment: Following commands, Problem solving, Safety/judgement                       Following Commands: Follows one step commands with increased time, Follows multi-step commands inconsistently Safety/Judgement: Decreased awareness of deficits   Problem Solving: Slow processing, Decreased initiation, Difficulty sequencing, Requires verbal cues, Requires tactile cues General Comments: pt eating italian ice with a plastic knife on  arrival; required multimodal cues and increased time to process; very pleasant and willing to participate        Exercises General Exercises - Lower Extremity Ankle Circles/Pumps: AROM, Both, 10 reps Short Arc Quad: AROM, Seated, Both, 10 reps Hip ABduction/ADduction: AROM, Seated, Both, 10 reps    General Comments        Pertinent Vitals/Pain Pain Assessment Pain Assessment: No/denies pain Pain Intervention(s): Monitored during  session, Repositioned    Home Living                          Prior Function            PT Goals (current goals can now be found in the care plan section) Progress towards PT goals: Progressing toward goals    Frequency    Min 2X/week      PT Plan Current plan remains appropriate    Co-evaluation              AM-PAC PT "6 Clicks" Mobility   Outcome Measure  Help needed turning from your back to your side while in a flat bed without using bedrails?: A Lot Help needed moving from lying on your back to sitting on the side of a flat bed without using bedrails?: Total Help needed moving to and from a bed to a chair (including a wheelchair)?: A Lot Help needed standing up from a chair using your arms (e.g., wheelchair or bedside chair)?: A Lot Help needed to walk in hospital room?: Total Help needed climbing 3-5 steps with a railing? : Total 6 Click Score: 9    End of Session Equipment Utilized During Treatment: Gait belt Activity Tolerance: Patient limited by fatigue Patient left: in chair;with call bell/phone within reach;with chair alarm set Nurse Communication: Mobility status PT Visit Diagnosis: Other abnormalities of gait and mobility (R26.89);Difficulty in walking, not elsewhere classified (R26.2);Muscle weakness (generalized) (M62.81)     Time: 2671-2458 PT Time Calculation (min) (ACUTE ONLY): 23 min  Charges:  $Therapeutic Exercise: 8-22 mins $Therapeutic Activity: 8-22 mins                    Jannette Spanner PT, DPT Acute Rehabilitation Services Pager: [phone] Office: [place] 07/09/2021, 1:48 PM

## 2021-07-09 NOTE — Progress Notes (Signed)
PROGRESS NOTE    Stephen Lynch  PVX:[id number] DOB: 06-Jul-1945 DOA: 07/05/2021 PCP: [facility]    Brief Narrative:  76 y/o male with history of stage 4 prostate CA on chemo, IDDM, admitted to the hospital with melena, acute blood loss anemia, AKI and metabolic acidosis. He was also noted to be coagulopathic with INR of 2.1, no reported history of cirrhosis. Baseline hemoglobin noted to be 10.7 on 5/16, admission hemoglobin 5.6. He received 2 unit prbc and FFP and follow up hemoglobin 3.3. He received another 4 unit prbc. GI following. He underwent EGD that showed gastric ulcer s/p Epi, BICAP, and hemoclipping.  He has since stabilized.  Currently awaiting placement at skilled nursing facility..   Assessment & Plan:   Principal Problem:   Acute blood loss anemia Active Problems:   Dyslipidemia   Diabetes mellitus without complication (HCC)   Goals of care, counseling/discussion   Uncontrolled type 2 diabetes mellitus with hyperglycemia, with long-term current use of insulin (HCC)   Malignant neoplasm of prostate metastatic to bone (HCC)   Hypoalbuminemia due to protein-calorie malnutrition (HCC)   AKI (acute kidney injury) ([place])   Melena   Leukocytosis   Metabolic acidosis   Coagulopathy (HCC)   Metastases to the liver (HCC)   Thrombocytopenia (HCC)   Acute blood loss anemia -Patient's hemoglobin was 10.7 on 5/16 -Admission hemoglobin noted to be 5.6 -2 units PRBC were transfused on admission -follow up hemoglobin 3.3 (confirmed on recheck per staff) -He has since received another 5 unit prbc for a total of 7 units thus far -Hemoglobin currently stable at 9.2 -continue to follow   Melena secondary to bleeding gastric ulcer -GI following -Continue with volume resuscitation -No history of NSAIDs -EGD on 5/26: gastric ulcer s/p Epi, BICAP, and hemoclipping. -Completed course of PPI infusion, now on Protonix twice daily -continue carafate -Diet advanced to  solid foods -he has had continued dark stools, although this may be old blood -BUN has been trending down -We will need GI follow-up in 2 weeks  Leukocytosis -Suspect this is stress response from rapid GI bleed -Since he appears to be having shivering and is immunocompromised from chemotherapy, he was covered with broad-spectrum antibiotics -Chest x-ray without evidence of pneumonia -WBC 48.2 -> 15.2 -he has been afebrile and does not appear toxic -blood culture/urine culture without any growth to date -Antibiotics discontinued  Thrombocytopenia -possibly related to consumption from large GI bleed -continue to follow   Coagulopathy -Etiology is unclear but possibly related to liver mets  -Admission INR 2.1 -No reported history of cirrhosis -RUQ ultrasound did not show liver changes consistent with cirrhosis, portal vein was patent -CT abdomen comments on multiple possible liver mets -Received vitamin K/FFP 5/25 -current INR 1.5   Acute kidney injury -Likely related to volume depletion/anemia -creatinine 1.4 on admission -creatinine improved with IV fluids, currently 0.79 -follow urine output   Normal anion gap metabolic acidosis -Likely related to vomiting/GI losses -resolved with bicarbonate infusion   Hypoalbuminemia -Suspect related to poor nutritional status -Request nutrition to follow -continue megace   Stage IV prostate cancer with mets to bone -CT abd shows possible liver mets -Followed by Dr. Alen Blew, current treatment is palliative -Dr. Alen Blew added to treatment team.   Diabetes mellitus type 2, insulin-dependent, uncontrolled with hyperglycemia -restart basal insulin -Cover with sliding scale insulin  Hyperlipidemia -continue statin  Generalized weakness -PT/OT with recommendations for skilled nursing facility   Goals of care -Followed by palliative care as an  outpatient -I had a lengthy discussion with patient and his fiance, [name] -[name] reports that she is patient's power of attorney and will bring in appropriate paperwork -We discussed his current medical comorbidities and expected prognosis in the event of a cardiopulmonary arrest -After discussion, patient agrees with DNR status at this point. -palliative care consulted and following in the hospital   DVT prophylaxis: SCDs Start: 07/05/21 1631  Code Status: DNR Family Communication: updated patient fiance Ms. Milas Gain Disposition Plan: Status is: Inpatient Remains inpatient appropriate because: Patient is stable for discharge pending placement to skilled nursing facility     Consultants:  GI Palliative care  Procedures:  5/26 EGD: gastric ulcer s/p Epi, BICAP, and hemoclipping.  Antimicrobials:  Vancomycin 5/25>5/28 Cefepime 5/25> 5/28   Subjective: Last BM was yesterday.  No vomiting.  Tolerating solid diet.  Seen sitting up in a chair and seems to be doing better.  Objective: Vitals:   07/08/21 2200 07/09/21 0200 07/09/21 0702 07/09/21 1347  BP: 138/74 130/76 134/76 (!) 120/53  Pulse: 80 77 74 72  Resp: '13 14  16  '$ Temp: 98.1 F (36.7 C) 97.7 F (36.5 C) 98.1 F (36.7 C)   TempSrc: Oral Oral Oral   SpO2: 100% 100% 99%   Weight:      Height:        Intake/Output Summary (Last 24 hours) at 07/09/2021 1648 Last data filed at 07/09/2021 1620 Gross per 24 hour  Intake 664 ml  Output 500 ml  Net 164 ml   Filed Weights   07/05/21 1620  Weight: 87.5 kg    Examination:  General exam: Appears calm and comfortable  Respiratory system: clear bilaterally. Respiratory effort normal. Cardiovascular system: S1 & S2 heard, RRR. No JVD, murmurs, rubs, gallops or clicks.  Gastrointestinal system: Abdomen is nondistended, soft and nontender. No organomegaly or masses felt. Normal bowel sounds heard. Central nervous system: Alert and oriented. No focal neurological deficits. Extremities: 1+ edema bilaterally Skin: No rashes, lesions  or ulcers Psychiatry: Judgement and insight appear normal. Mood & affect appropriate.     Data Reviewed: I have personally reviewed following labs and imaging studies  CBC: Recent Labs  Lab 07/05/21 1229 07/05/21 1236 07/07/21 1507 07/07/21 2227 07/08/21 0448 07/08/21 1200 07/09/21 0441  WBC 48.2*   < > 18.0* 19.3* 15.2* 16.4* 14.6*  NEUTROABS 35.7*  --   --   --   --   --   --   HGB 5.6*   < > 6.5* 9.3* 8.3* 9.5* 9.2*  HCT 18.2*   < > 19.1* 27.4* 24.4* 29.1* 27.4*  MCV 102.2*   < > 91.0 90.1 89.7 92.4 90.7  PLT 189   < > 71* 83* 70* 77* 74*   < > = values in this interval not displayed.   Basic Metabolic Panel: Recent Labs  Lab 07/05/21 1413 07/06/21 0635 07/07/21 0500 07/08/21 0448 07/09/21 0441  NA 144 144 145 137 142  K 4.3 3.9 3.8 3.6 3.4*  CL 115* 116* 116* 109 115*  CO2 12* 22 23 21* 23  GLUCOSE 177* 185* 183* 354* 103*  BUN 34* 37* 33* 25* 18  CREATININE 1.43* 1.17 1.12 0.92 0.79  CALCIUM 8.1* 7.4* 7.7* 7.7* 8.0*   GFR: Estimated Creatinine Clearance: 91.6 mL/min (by C-G formula based on SCr of 0.79 mg/dL). Liver Function Tests: Recent Labs  Lab 07/05/21 1413 07/06/21 0815 07/07/21 0500 07/08/21 0448 07/09/21 0441  AST 55* 132* 81* 42*  47*  ALT 23 34 32 23 26  ALKPHOS 379* 284* 278* 221* 243*  BILITOT 1.0 1.0 1.6* 1.0 1.2  PROT 4.5* 4.5* 4.4* 4.6* 4.5*  ALBUMIN 2.0* 2.5* 2.3* 2.6* 2.4*   No results for input(s): LIPASE, AMYLASE in the last 168 hours. Recent Labs  Lab 07/05/21 1229 07/06/21 0815 07/07/21 0514 07/08/21 0540  AMMONIA 73* 58* 54* 52*   Coagulation Profile: Recent Labs  Lab 07/05/21 1229 07/06/21 0635 07/07/21 0500  INR 2.1* 1.8* 1.5*   Cardiac Enzymes: No results for input(s): CKTOTAL, CKMB, CKMBINDEX, TROPONINI in the last 168 hours. BNP (last 3 results) No results for input(s): PROBNP in the last 8760 hours. HbA1C: No results for input(s): HGBA1C in the last 72 hours. CBG: Recent Labs  Lab 07/08/21 1217  07/08/21 1602 07/08/21 2217 07/09/21 0739 07/09/21 1102  GLUCAP 413* 167* 93 120* 205*   Lipid Profile: No results for input(s): CHOL, HDL, LDLCALC, TRIG, CHOLHDL, LDLDIRECT in the last 72 hours. Thyroid Function Tests: No results for input(s): TSH, T4TOTAL, FREET4, T3FREE, THYROIDAB in the last 72 hours. Anemia Panel: No results for input(s): VITAMINB12, FOLATE, FERRITIN, TIBC, IRON, RETICCTPCT in the last 72 hours. Sepsis Labs: No results for input(s): PROCALCITON, LATICACIDVEN in the last 168 hours.  Recent Results (from the past 240 hour(s))  Culture, blood (Routine X 2) w Reflex to ID Panel     Status: None (Preliminary result)   Collection Time: 07/05/21  5:05 PM   Specimen: BLOOD  Result Value Ref Range Status   Specimen Description   Final    BLOOD RIGHT ANTECUBITAL Performed at [facility] [address] [place], [place] [address]    Special Requests   Final    IN PEDIATRIC BOTTLE Blood Culture adequate volume Performed at [facility] [address], [place], [place] [address]    Culture   Final    NO GROWTH 4 DAYS Performed at [facility], [place] [address] [place], [place] [address]    Report Status PENDING  Incomplete  Urine Culture     Status: Abnormal   Collection Time: 07/05/21  7:24 PM   Specimen: Urine, Clean Catch  Result Value Ref Range Status   Specimen Description   Final    URINE, CLEAN CATCH Performed at [facility], [place] [address] [place], [place] [address]    Special Requests   Final    NONE Performed at [facility], [place], [place] [address], [place], [place] [address]    Culture (A)  Final    <10,000 COLONIES/mL INSIGNIFICANT GROWTH Performed at [facility] [address] [place], [place] [address]    Report Status 07/06/2021 FINAL  Final         Radiology Studies: No results found.      Scheduled Meds:  feeding supplement  1 Container Oral  BID BM   feeding supplement  237 mL Oral Q24H   insulin aspart  0-20 Units Subcutaneous TID WC   insulin aspart  0-5 Units Subcutaneous QHS   insulin glargine-yfgn  20 Units Subcutaneous Daily   mouth rinse  15 mL Mouth Rinse BID   megestrol  400 mg Oral BID   metoprolol tartrate  25 mg Oral BID   multivitamin with minerals  1 tablet Oral Daily   pantoprazole  40 mg Oral BID AC   rosuvastatin  10 mg Oral Daily   sodium chloride flush  10-40 mL Intracatheter  Q12H   sucralfate  1 g Oral TID WC & HS   Continuous Infusions:     LOS: 4 days    Time spent: 33mns    JKathie Dike MD [facility]   If 7PM-7AM, please contact night-coverage [email or web address]  07/09/2021, 4:48 PM

## 2021-07-09 NOTE — Progress Notes (Signed)
Subjective: No complaints.  Feeling well.  Objective: Vital signs in last 24 hours: Temp:  [97.7 F (36.5 C)-98.3 F (36.8 C)] 98.1 F (36.7 C) (05/29 0702) Pulse Rate:  [74-80] 74 (05/29 0702) Resp:  [12-14] 14 (05/29 0200) BP: (123-138)/(65-76) 134/76 (05/29 0702) SpO2:  [99 %-100 %] 99 % (05/29 0702) Last BM Date : 07/08/21  Intake/Output from previous day: 05/28 0701 - 05/29 0700 In: 1059.2 [P.O.:360; I.V.:269.2; IV Piggyback:430] Out: -  Intake/Output this shift: Total I/O In: 60 [P.O.:60] Out: 500 [Urine:500]  General appearance: alert and no distress GI: soft, non-tender; bowel sounds normal; no masses,  no organomegaly  Lab Results: Recent Labs    07/08/21 0448 07/08/21 1200 07/09/21 0441  WBC 15.2* 16.4* 14.6*  HGB 8.3* 9.5* 9.2*  HCT 24.4* 29.1* 27.4*  PLT 70* 77* 74*   BMET Recent Labs    07/07/21 0500 07/08/21 0448 07/09/21 0441  NA 145 137 142  K 3.8 3.6 3.4*  CL 116* 109 115*  CO2 23 21* 23  GLUCOSE 183* 354* 103*  BUN 33* 25* 18  CREATININE 1.12 0.92 0.79  CALCIUM 7.7* 7.7* 8.0*   LFT Recent Labs    07/09/21 0441  PROT 4.5*  ALBUMIN 2.4*  AST 47*  ALT 26  ALKPHOS 243*  BILITOT 1.2   PT/INR Recent Labs    07/07/21 0500  LABPROT 18.1*  INR 1.5*   Hepatitis Panel No results for input(s): HEPBSAG, HCVAB, HEPAIGM, HEPBIGM in the last 72 hours. C-Diff No results for input(s): CDIFFTOX in the last 72 hours. Fecal Lactopherrin No results for input(s): FECLLACTOFRN in the last 72 hours.  Studies/Results: No results found.  Medications: Scheduled:  feeding supplement  1 Container Oral BID BM   feeding supplement  237 mL Oral Q24H   insulin aspart  0-20 Units Subcutaneous TID WC   insulin aspart  0-5 Units Subcutaneous QHS   insulin glargine-yfgn  20 Units Subcutaneous Daily   mouth rinse  15 mL Mouth Rinse BID   megestrol  400 mg Oral BID   metoprolol tartrate  25 mg Oral BID   multivitamin with minerals  1 tablet Oral  Daily   pantoprazole  40 mg Intravenous Q12H   rosuvastatin  10 mg Oral Daily   sodium chloride flush  10-40 mL Intracatheter Q12H   sucralfate  1 g Oral TID WC & HS   Continuous:  Assessment/Plan: 1) Gastric ulcer. 2) Anemia.   The patient clinically appears much stronger.  His HGB is stable.  Plan: 1) Continue to monitor HGB and transfuse if necessary. 2) Change to oral pantoprazole 40 mg BID x 1 month and then QD indefinitely. 3) Upon discharge follow up in the office in 2 weeks. 4) Continue with sucralfate for a total of 1 month. 5) Signing off.  LOS: 4 days   Deryl Giroux D 07/09/2021, 12:19 PM

## 2021-07-09 NOTE — Progress Notes (Signed)
Occupational Therapy Treatment Patient Details Name: Stephen Lynch MRN: [id number] DOB: 08/26/1945 Today's Date: 07/09/2021   History of present illness 76 y/o male admitted to the hospital with melena, acute blood loss anemia, AKI and metabolic acidosis. admission hemoglobin 5.6. He received 2 unit prbc and FFP and follow up hemoglobin 3.3. He received another 4 unit prbc. GI following. He underwent EGD that showed gastric ulcer s/p Epi, BICAP, and hemoclipping. PMH: stage 4 prostate CA on chemo, IDDM.   OT comments  patient participated in sit to stand with increased difficulty processig how to scoot forwards to edge of recliner with visual and verbal cues provided. patient required max A for sit to stand with min A for standing balance with cues for sequencing. once standing, patient reported dizziness. patient sat back down in chair with min A to avoid ploping with BP aken. bp was 117/ 66mgh. nurse made aware.patient was noted to require increased cognitive and physical support compared to last admission. Patient's discharge plan remains appropriate at this time. OT will continue to follow acutely.     Recommendations for follow up therapy are one component of a multi-disciplinary discharge planning process, led by the attending physician.  Recommendations may be updated based on patient status, additional functional criteria and insurance authorization.    Follow Up Recommendations  Skilled nursing-short term rehab (<3 hours/day)    Assistance Recommended at Discharge Frequent or constant Supervision/Assistance  Patient can return home with the following  Two people to help with walking and/or transfers;Two people to help with bathing/dressing/bathroom;Help with stairs or ramp for entrance;Direct supervision/assist for medications management;Assist for transportation;Direct supervision/assist for financial management;Assistance with cooking/housework   Equipment Recommendations  None  recommended by OT    Recommendations for Other Services      Precautions / Restrictions Precautions Precautions: Fall Restrictions Weight Bearing Restrictions: No       Mobility Bed Mobility                    Transfers                         Balance                                           ADL either performed or assessed with clinical judgement   ADL                                              Extremity/Trunk Assessment              Vision       Perception     Praxis      Cognition Arousal/Alertness: Awake/alert Behavior During Therapy: Flat affect Overall Cognitive Status: Impaired/Different from baseline                                 General Comments: increased time to process all tasks.        Exercises Other Exercises Other Exercises: patient participated in sit to stand with increased difficulty processig how to scoot forwards to edge of recliner with visual and verbal cues provided. patient required max A for sit to stand  with min A for standing balance with cues for sequencing. once standing, patient reported dizziness. patient sat back down in chair with min A to avoid ploping with BP aken. bp was 117/ 7mgh. nurse made aware. Other Exercises: patient completed 5 reps of chair push ups with mod A to complete tasks with cues for sequencing.    Shoulder Instructions       General Comments      Pertinent Vitals/ Pain       Pain Assessment Pain Assessment: No/denies pain  Home Living                                          Prior Functioning/Environment              Frequency  Min 2X/week        Progress Toward Goals  OT Goals(current goals can now be found in the care plan section)  Progress towards OT goals: Progressing toward goals     Plan Discharge plan remains appropriate    Co-evaluation                 AM-PAC OT  "6 Clicks" Daily Activity     Outcome Measure   Help from another person eating meals?: A Lot Help from another person taking care of personal grooming?: A Lot Help from another person toileting, which includes using toliet, bedpan, or urinal?: Total Help from another person bathing (including washing, rinsing, drying)?: Total Help from another person to put on and taking off regular upper body clothing?: A Lot Help from another person to put on and taking off regular lower body clothing?: Total 6 Click Score: 9    End of Session Equipment Utilized During Treatment: Rolling walker (2 wheels)  OT Visit Diagnosis: Unsteadiness on feet (R26.81);Other abnormalities of gait and mobility (R26.89);Muscle weakness (generalized) (M62.81);Pain   Activity Tolerance Patient tolerated treatment well   Patient Left in chair;with call bell/phone within reach;with chair alarm set   Nurse Communication Other (comment) (dizziness)        Time: 19562-1308OT Time Calculation (min): 18 min  Charges: OT General Charges $OT Visit: 1 Visit OT Treatments $Therapeutic Activity: 8-22 mins  MJackelyn PolingOTR/L, MS Acute Rehabilitation Department Office# [phone]# [phone]  MMarcellina Millin5/29/2023, 2:34 PM

## 2021-07-10 ENCOUNTER — Telehealth: Payer: Self-pay

## 2021-07-10 ENCOUNTER — Encounter (HOSPITAL_COMMUNITY): Payer: Self-pay | Admitting: Gastroenterology

## 2021-07-10 DIAGNOSIS — E119 Type 2 diabetes mellitus without complications: Secondary | ICD-10-CM | POA: Diagnosis not present

## 2021-07-10 DIAGNOSIS — N179 Acute kidney failure, unspecified: Secondary | ICD-10-CM | POA: Diagnosis not present

## 2021-07-10 DIAGNOSIS — D62 Acute posthemorrhagic anemia: Secondary | ICD-10-CM | POA: Diagnosis not present

## 2021-07-10 DIAGNOSIS — D689 Coagulation defect, unspecified: Secondary | ICD-10-CM | POA: Diagnosis not present

## 2021-07-10 LAB — GLUCOSE, CAPILLARY
Glucose-Capillary: 208 mg/dL — ABNORMAL HIGH (ref 70–99)
Glucose-Capillary: 253 mg/dL — ABNORMAL HIGH (ref 70–99)
Glucose-Capillary: 318 mg/dL — ABNORMAL HIGH (ref 70–99)

## 2021-07-10 LAB — CBC
HCT: 28.2 % — ABNORMAL LOW (ref 39.0–52.0)
Hemoglobin: 9.3 g/dL — ABNORMAL LOW (ref 13.0–17.0)
MCH: 30.7 pg (ref 26.0–34.0)
MCHC: 33 g/dL (ref 30.0–36.0)
MCV: 93.1 fL (ref 80.0–100.0)
Platelets: 88 10*3/uL — ABNORMAL LOW (ref 150–400)
RBC: 3.03 MIL/uL — ABNORMAL LOW (ref 4.22–5.81)
RDW: 21.7 % — ABNORMAL HIGH (ref 11.5–15.5)
WBC: 12.8 10*3/uL — ABNORMAL HIGH (ref 4.0–10.5)
nRBC: 0.5 % — ABNORMAL HIGH (ref 0.0–0.2)

## 2021-07-10 LAB — CULTURE, BLOOD (ROUTINE X 2)
Culture: NO GROWTH
Special Requests: ADEQUATE

## 2021-07-10 MED ORDER — METOPROLOL TARTRATE 25 MG PO TABS
25.0000 mg | ORAL_TABLET | Freq: Two times a day (BID) | ORAL | 0 refills | Status: AC
Start: 2021-07-10 — End: ?

## 2021-07-10 MED ORDER — PANTOPRAZOLE SODIUM 40 MG PO TBEC
40.0000 mg | DELAYED_RELEASE_TABLET | Freq: Two times a day (BID) | ORAL | 1 refills | Status: AC
Start: 2021-07-10 — End: ?

## 2021-07-10 MED ORDER — SUCRALFATE 1 G PO TABS: 1.0000 g | ORAL_TABLET | Freq: Four times a day (QID) | ORAL | 0 refills | Status: AC

## 2021-07-10 MED ORDER — HEPARIN SOD (PORK) LOCK FLUSH 100 UNIT/ML IV SOLN
500.0000 [IU] | Freq: Once | INTRAVENOUS | Status: AC
  Administered 2021-07-10: 500 [IU] via INTRAVENOUS
  Filled 2021-07-10: qty 5

## 2021-07-10 NOTE — TOC Progression Note (Addendum)
Transition of Care [facility]) - Progression Note    Patient Details  Name: Stephen Lynch MRN: [id number] Date of Birth: [birth year]  Transition of Care [facility]) CM/SW Contact  Lennart Pall, LCSW Phone Number: 07/11/2021, 2:00 PM  Clinical Narrative:    Met with pt and spoke with s/o, Stephen Lynch, this afternoon to discuss discharge plans.  Both aware that therapy has made recommendations for SNF rehab, however, pt feels he is improving and would prefer to dc home.  Ms. Stephen Lynch is aware of recommendation but does agree that pt is improving and notes that there are 3 adults in the home and believes they can manage pt's support needs.  She does ask about getting a wheelchair for pt and this has been ordered via [employer] for delivery to room.  Pt reports he was actively receiving home therapies PTA and I have confirmed that pt is open to [facility] who can resume if/ when pt is to dc home (will need new orders for HHPT/OT).  Will continue to follow at this point for medical clearance to dc.  ADDENDUM:  Pt medically cleared for dc today home with family.  Wheelchair ordered via [employer] and delivered to room.  [facility] has orders to restart HHPT/OT services.  Expected Discharge Plan: Home/Self Care Barriers to Discharge: Barriers Resolved  Expected Discharge Plan and Services Expected Discharge Plan: Home/Self Care In-house Referral: Clinical Social Work     Living arrangements for the past 2 months: Single Family Home Expected Discharge Date: 07/10/21               DME Arranged: Youth worker wheelchair with seat cushion DME Agency: [employer] Date DME Agency Contacted: 07/10/21 Time DME Agency Contacted: 1500 Representative spoke with at DME Agency: [place]: PT, OT [place] Agency: [place]     Representative spoke with at [place]: [place] ([place]) Interventions    Readmission Risk Interventions    07/06/2021   10:45 AM  05/27/2021    2:29 PM  Readmission Risk Prevention Plan  Transportation Screening  Complete  PCP or Specialist Appt within 3-5 Days  Complete  HRI or Home Care Consult  Complete  Medication Review (RN Care Manager) Complete   HRI or [place] Complete   SW Recovery Care/Counseling Consult Complete   Palliative Care Screening Complete   [place] Not Applicable

## 2021-07-10 NOTE — Plan of Care (Signed)
Plan of care reviewed and discussed. °

## 2021-07-10 NOTE — Progress Notes (Signed)
Inpatient Diabetes Program Recommendations  AACE/ADA: New Consensus Statement on Inpatient Glycemic Control (2015)  Target Ranges:  Prepandial:   less than 140 mg/dL      Peak postprandial:   less than 180 mg/dL (1-2 hours)      Critically ill patients:  140 - 180 mg/dL   Lab Results  Component Value Date   GLUCAP 253 (H) 07/10/2021   HGBA1C 8.4 (H) 05/14/2021    Review of Glycemic Control  Latest Reference Range & Units 07/09/21 07:39 07/09/21 11:02 07/09/21 17:00 07/09/21 21:38 07/10/21 07:24 07/10/21 11:39  Glucose-Capillary 70 - 99 mg/dL 120 (H) 205 (H) 310 (H) 265 (H) 208 (H) 253 (H)  (H): Data is abnormally high  Diabetes history: DM2 Outpatient Diabetes medications: Tresiba 20 units QD, Humalog 3-11 units TID Current orders for Inpatient glycemic control: Semglee 20 units QD, Novolog 0-20 units TID and 0-5 units QHS  Inpatient Diabetes Program Recommendations:    Novolog 3 units TID with meals if consumes at least 50%  Will continue to follow while inpatient.  Thank you, Reche Dixon, MSN, [place] Diabetes Coordinator Inpatient Diabetes Program [phone] (team pager from 8a-5p)

## 2021-07-10 NOTE — Telephone Encounter (Signed)
Pt was seen by myself and Alda Lea, NP in his hospital room. Pt was in the chair at the time, we introduced ourselves and made him aware of his upcoming appointment with palliative care at the cancer center next week on 07/20/21. Pt verbalized understanding and had no questions for [place].

## 2021-07-10 NOTE — Progress Notes (Signed)
Physical Therapy Treatment Patient Details Name: Stephen Lynch MRN: [id number] DOB: Nov 13, 1945 Today's Date: 07/10/2021   History of Present Illness 76 y/o male admitted to the hospital with melena, acute blood loss anemia, AKI and metabolic acidosis. admission hemoglobin 5.6. He received 2 unit prbc and FFP and follow up hemoglobin 3.3. He received another 4 unit prbc. GI following. He underwent EGD that showed gastric ulcer s/p Epi, BICAP, and hemoclipping. PMH: stage 4 prostate CA on chemo, IDDM.    PT Comments    The patient  demonstrates improved balance, able to ambulate x 5' x 5 from recliner and backwards( Has Primofit catheter). Continue PT for increased ambulation.   Recommendations for follow up therapy are one component of a multi-disciplinary discharge planning process, led by the attending physician.  Recommendations may be updated based on patient status, additional functional criteria and insurance authorization.  Follow Up Recommendations  Skilled nursing-short term rehab (<3 hours/day)     Assistance Recommended at Discharge Frequent or constant Supervision/Assistance  Patient can return home with the following A little help with walking and/or transfers;Assist for transportation;Help with stairs or ramp for entrance;A little help with bathing/dressing/bathroom;Assistance with cooking/housework;Direct supervision/assist for financial management;Direct supervision/assist for medications management   Equipment Recommendations  None recommended by PT    Recommendations for Other Services       Precautions / Restrictions Precautions Precautions: Fall     Mobility  Bed Mobility               General bed mobility comments: in recliner    Transfers Overall transfer level: Needs assistance   Transfers: Sit to/from Stand Sit to Stand: Min assist           General transfer comment: cues for safety and hand placement, assist to rise and transition to RW.  able to take a few steps fwd and back to chair with ~ min assist x 5' x 5 ( on Primofit) also performed marching in place briefly in front of recliner    Ambulation/Gait                   Stairs             Wheelchair Mobility    Modified Rankin (Stroke Patients Only)       Balance   Sitting-balance support: Single extremity supported, No upper extremity supported, Feet supported Sitting balance-Leahy Scale: Fair     Standing balance support: Reliant on assistive device for balance, During functional activity, Bilateral upper extremity supported Standing balance-Leahy Scale: Fair                              Cognition Arousal/Alertness: Awake/alert   Overall Cognitive Status: Impaired/Different from baseline Area of Impairment: Following commands, Problem solving, Safety/judgement                 Orientation Level: Disoriented to, Situation, Place     Following Commands: Follows one step commands with increased time, Follows multi-step commands inconsistently Safety/Judgement: Decreased awareness of deficits   Problem Solving: Slow processing, Decreased initiation, Difficulty sequencing, Requires verbal cues, Requires tactile cues General Comments: increased time to process all tasks.perseverating that door and curtain will not stay closed(actually door is open)        Exercises      General Comments        Pertinent Vitals/Pain Pain Assessment Pain Assessment: No/denies pain    Home Living  Prior Function            PT Goals (current goals can now be found in the care plan section) Progress towards PT goals: Progressing toward goals    Frequency    Min 2X/week      PT Plan Current plan remains appropriate    Co-evaluation              AM-PAC PT "6 Clicks" Mobility   Outcome Measure  Help needed turning from your back to your side while in a flat bed without using  bedrails?: A Lot Help needed moving from lying on your back to sitting on the side of a flat bed without using bedrails?: A Lot Help needed moving to and from a bed to a chair (including a wheelchair)?: A Lot Help needed standing up from a chair using your arms (e.g., wheelchair or bedside chair)?: A Lot Help needed to walk in hospital room?: Total Help needed climbing 3-5 steps with a railing? : Total 6 Click Score: 10    End of Session Equipment Utilized During Treatment: Gait belt Activity Tolerance: Patient tolerated treatment well Patient left: in chair;with call bell/phone within reach;with nursing/sitter in room Nurse Communication: Mobility status PT Visit Diagnosis: Other abnormalities of gait and mobility (R26.89);Difficulty in walking, not elsewhere classified (R26.2);Muscle weakness (generalized) (M62.81)     Time: 1638-4536 PT Time Calculation (min) (ACUTE ONLY): 12 min  Charges:  $Gait Training: 8-22 mins                     [place] Pager [phone] Office [phone]    Claretha Cooper 07/10/2021, 12:49 PM

## 2021-07-10 NOTE — Progress Notes (Signed)
Patient suffers from  Lower extremity weakness which impairs their ability to perform daily activities like safe ambulation  in the home.  A walker alone will not resolve the issues with performing activities of daily living. A wheelchair will allow patient to safely perform daily activities.  The patient can self propel in the home or has a caregiver who can provide assistance.    [place] Pager [phone] Office [phone]

## 2021-07-10 NOTE — Progress Notes (Signed)
Patient's significant other at bedside, she observed patient ambulate during her visit and stated she feels comfortable with patient returning home. MD made aware.

## 2021-07-11 ENCOUNTER — Other Ambulatory Visit: Payer: Self-pay | Admitting: *Deleted

## 2021-07-11 NOTE — Patient Outreach (Signed)
[facility]) Care Management Telephonic RN Care Manager Note   07/11/2021 Name:  Stephen Lynch MRN:  [id number] DOB:  February 24, 1945  Summary: Stephen Lynch call placed to member's significant other, successful.  Denies any urgent concerns, encouraged to contact this care manager with questions.   Subjective: Stephen Lynch is an 76 y.o. year old male who is a primary patient of [facility]. The care management team was consulted for assistance with care management and/or care coordination needs.    Telephonic RN Care Manager completed Telephone Visit today.  Objective:   Medications Reviewed Today     Reviewed by Campbell Lerner, RN (Registered Nurse) on 07/06/21 at 1433  Med List Status: Complete   Medication Order Taking? Sig Documenting Provider Last Dose Status Informant  acetaminophen (TYLENOL) 500 MG tablet [id number] Yes Take 1,000 mg by mouth daily as needed for moderate pain. [provider] 07/04/2021 Active Spouse/Significant Other  ASPIRIN 81 PO [id number] Yes Take 81 mg by mouth daily.  [provider] 07/04/2021 Active Spouse/Significant Other  Calcium-Magnesium-Zinc 333-133-5 MG TABS [id number] Yes Take 1 tablet by mouth at bedtime. [provider] 07/04/2021 Active Spouse/Significant Other  Continuous Blood Gluc Sensor (FREESTYLE LIBRE 2 SENSOR) MISC [id number]  APPLY EVERY 14 DAYS, CHECK GLUCOSE BEFORE EACH MEAL, 2 HRS AFTER, IN THE MORNING AND BEFORE BEDTIME [name], MD  Active Spouse/Significant Other  famotidine (PEPCID) 20 MG tablet [id number] Yes Take 20 mg by mouth daily. [provider] 07/04/2021 Active Spouse/Significant Other  ferrous sulfate 325 (65 FE) MG tablet [id number] Yes Take 1 tablet (325 mg total) by mouth daily. Niel Hummer A, MD 07/04/2021 Active Spouse/Significant Other  HUMALOG KWIKPEN 100 UNIT/ML KwikPen [id number] Yes Inject 3-11 Units into the skin in the morning, at noon, and at  bedtime. CBG 70 - 120: 0 units  CBG 121 - 150: 3 units  CBG 151 - 200: 4 units  CBG 201 - 250: 7 units  CBG 251 - 300: 11 units   CBG more than 350 call PCP Regalado, Belkys A, MD 07/05/2021 Active Spouse/Significant Other  lidocaine (LIDODERM) 5 % [id number] Yes Place 2 patches onto the skin daily. Remove & Discard patch within 12 hours or as directed by MD Flora Lipps, MD 07/04/2021 Active Spouse/Significant Other  lidocaine-prilocaine (EMLA) cream [id number] No Apply 1 application topically as needed.  Patient not taking: Reported on 07/05/2021   [name], MD Not Taking Active Spouse/Significant Other  loperamide (IMODIUM) 1 MG/5ML solution [id number] Yes Take 2 mg by mouth daily as needed for diarrhea or loose stools. [provider] 07/05/2021 Active Spouse/Significant Other  magnesium 30 MG tablet [id number] Yes Take 30 mg by mouth daily. [provider] Past Week Active Spouse/Significant Other  megestrol (MEGACE ES) 625 MG/5ML suspension [id number] Yes Take 5 mLs (625 mg total) by mouth daily. [name], MD 07/04/2021 Active Spouse/Significant Other  megestrol (MEGACE) 400 MG/10ML suspension [id number]  Take 10 mLs (400 mg total) by mouth 2 (two) times daily. Regalado, [name], MD  Active Spouse/Significant Other  Menthol-Camphor (TIGER BALM ARTHRITIS RUB EX) [id number] Yes Apply 1 application topically 2 (two) times daily as needed (back pain). [provider] Past Month Active Spouse/Significant Other  Nutritional Supplements (,FEEDING SUPPLEMENT, PROSOURCE PLUS) liquid [id number] No Take 30 mLs by mouth 2 (two) times daily between meals.  Patient not taking: Reported on 07/05/2021   Elmarie Shiley, MD Not Taking Active Spouse/Significant Other  nystatin (MYCOSTATIN) 100000 UNIT/ML suspension  [id number] Yes Take 5 mLs (500,000 Units total) by mouth 4 (four) times daily. Niel Hummer A, MD 07/04/2021 Active Spouse/Significant Other  ondansetron  (ZOFRAN) 4 MG tablet [id number] Yes Take 1 tablet (4 mg total) by mouth every 6 (six) hours as needed. for nausea [name], MD 07/05/2021 Active Spouse/Significant Other  oxyCODONE (OXY IR/ROXICODONE) 5 MG immediate release tablet [id number] No Take 2 tablets (10 mg total) by mouth every 4 (four) hours as needed for severe pain.  Patient not taking: Reported on 07/05/2021   [name], MD Not Taking Active Spouse/Significant Other  Oxycodone HCl 10 MG TABS [id number] No Take 1 tablet (10 mg total) by mouth every 4 (four) hours as needed.  Patient taking differently: Take 10 mg by mouth every 4 (four) hours as needed (for pain).   [name], MD 07/03/2021 Active Spouse/Significant Other  prochlorperazine (COMPAZINE) 10 MG tablet [id number] No Take 1 tablet (10 mg total) by mouth every 6 (six) hours as needed.  Patient not taking: Reported on 07/05/2021   [name], MD Not Taking Active Spouse/Significant Other  rosuvastatin (CRESTOR) 10 MG tablet [id number] No Take 1 tablet (10 mg total) by mouth daily.  Patient not taking: Reported on 07/05/2021   [name], MD Not Taking Active Spouse/Significant Other  senna-docusate (SENOKOT-S) 8.6-50 MG tablet [id number] No Take 1 tablet by mouth daily.  Patient not taking: Reported on 07/05/2021   Elmarie Shiley, MD Not Taking Active Spouse/Significant Other  TRESIBA FLEXTOUCH 100 UNIT/ML FlexTouch Pen [id number] Yes Inject 20 Units into the skin at bedtime. Elmarie Shiley, MD 07/04/2021 Active Spouse/Significant Other             SDOH:  (Social Determinants of Health) assessments and interventions performed:     Care Plan  Review of patient past medical history, allergies, medications, health status, including review of consultants reports, laboratory and other test data, was performed as part of comprehensive evaluation for care management services.   Care Plan : RNCM Plan of Care  Updates made by [name], RN  since 07/11/2021 12:00 AM     Problem: Knoweldge deficit regarding management of health conditions, prostate cancer and DM   Priority: High     Long-Range Goal: Member will show adequate management of chronic conditions, Prostate Cancer and DM (goal extended due to not being on track)   Start Date: 10/27/2020  Expected End Date: 10/27/2021  This Visit's Progress: Not on track  Recent Progress: On track  Priority: High  Note:   Current Barriers:  Knowledge Deficits related to plan of care for management of DMII and Prostate cancer  Chronic Disease Management support and education needs related to DMII and Prostate cancer Transportation barriers Non-adherence to prescribed medication regimen  RNCM Clinical Goal(s):  Patient will verbalize understanding of plan for management of DMII and Prostate cancer as evidenced by no hospital admissions take all medications exactly as prescribed and will call provider for medication related questions as evidenced by reported adherence by member and wife    demonstrate understanding of rationale for each prescribed medication as evidenced by Insulin [name])    attend all scheduled medical appointments: PCP, oncology, and endocrinology as evidenced by record of attendance at provider offices        continue to work with RN Care Manager and/or Social Worker to address care management and care coordination needs related to DMII and prostate cancer as evidenced by adherence to [facility] Team Scheduled appointments  demonstrate ongoing self health care management ability to manage DM, including diet, medication management, and activity as evidenced by     through collaboration with RN Care manager, provider, and care team.   Interventions: Inter-disciplinary care team collaboration (see longitudinal plan of care) Evaluation of current treatment plan related to  self management and patient's adherence to plan as established by provider   Diabetes:  (Status:  Goal on Track (progressing): YES.) Lab Results  Component Value Date   HGBA1C 8.4 (H) 05/14/2021  Assessed patient's understanding of A1c goal: <7% Provided education to patient about basic DM disease process; Reviewed medications with patient and discussed importance of medication adherence;        Reviewed prescribed diet with patient low sugars, low carbs; Reviewed scheduled/upcoming provider appointments including: Endocrinology in 2021-08-19;         Advised patient, providing education and rationale, to check cbg 2-3 times a day and record        Assessed social determinant of health barriers;         Oncology:  (Status: Goal on track:  NO.) Assessment of understanding of oncology diagnosis:  Assessed patient understanding of cancer diagnosis and recommended treatment plan, Reviewed upcoming provider appointments and treatment appointments, and Assessed support system. Has consistent/reliable family or other support: Yes PSA on 4/24 was 499 PSA on 5/16 was 559   Update 11/11 - Significant other report member has been "out of it" as his cancer is progressing and requiring more pain medication for relief.  He has been seen by [facility], now active with the home based palliative care team.  Report he has not been able to stay mobile due to the pain, decreased appetite, drinking boost for nutritional support.  Blood sugars have been elevated as he has not been taking medication appropriately due to not being alert enough.  She has been calls to endocrinology for assistance, changes in insulin noted.  She is not happy with their experience at current PCP, looking to change providers however state it is now difficult to get member out of the house on some days.  Discussed having provider visit the home, advised of the [facility] program, she agrees to referral.    Update 11/21 - Spoke to significant other, confirms that [facility] made home visit on Friday 11/18.  Call placed to [facility] Cecille Rubin), confirmed that they will now be acting as member's primary provider.  They will make another home visit tomorrow to monitor member's status as member has recently had round of chemo.  She continues to have a hard time with managing member's blood sugars, follow labs and appointment with endocrinology scheduled for the next couple weeks.    Patient Goals/Self-Care Activities: Patient will self administer medications as prescribed Patient will attend all scheduled provider appointments Patient will call pharmacy for medication refills Patient will call provider office for new concerns or questions    Update 12/13 - Significant other state member is doing better than he has been the last couple weeks.  He has restarted chemo, PSA has decreased from 382 to 167.  Follow up with oncology on 12/29.  His appetite has increased however they were still having intermittent trouble with diabetes management.  Significant other called endocrinology office to report hypoglycemic episodes, lunch time insulin was decreased.  Since, readings have ranged from 160's-240.  Follow up with endocrinology on 1/5.   Update 1/10 - Member report he is feeling "much better."  Acknowledges that  he was not doing well a few months ago, has progressed with the help of chemo and PT.  Expresses gratitude for this care manager assisting significant other in securing new primary provider.  PSA levels have increased slightly but remain lower than previously (currently 218).  Next oncology appointment scheduled for 1/19.  Was seen by endocrinology on 1/5, most recent A1C was 6, will continue current treatment.  Blood sugar today was 120, will follow up with endocrinology on 4/5.   Update 2/7 [facility] he is not feeling well today, having pain over the last couple weeks.  Rate today at 3/10, generalized.  He is taking Oxycodone with relief, also has Lidocaine patch every 12 hours.  Does report he was able to get up to cook  breakfast this morning, which is better then the last few days.  Report blood glucose was about 200 today, but this was after breakfast. Discussed monitoring prior to eating and reviewed insuline doses.  Has follow up with endocrinology on 4/5.  Continue to have chemotherapy sessions however PSA increased to 463 on 1/19.  He will have his next treatment tomorrow and will see radiology oncology on Friday.  Remains active with [facility] for PCP services, state last visit was last Friday.  Per chart, he has also had home health ordered however he declined to have PT due to pain.  Encouraged to participate with agency to promote optimal health and strength.   Update 3/2 - Member report he has been doing better.  State his pain is not as bad, using Oxycodone and lidocaine patches.  He has started radiation treatments in addition to his chemo treatments.  Had daily radiation for the last 2 weeks, will follow up with radiology oncology 3/29 to determine if he will need additional sessions.  Scheduled for injection at cancer center tomorrow, at which time he will have get an updated treatment schedule.  Does not know what his blood sugar is today, state sensor came off during the night but he has called in for a refill.  Blood sugar was 140 yesterday.  Remains slightly confused in regards to insulin treatment, reviewed orders and importance of adhering to diet.  Will follow up with endocrinology on 4/5, repeat A1C on 4/3.  He is unsure when next home visit with [facility] is, significant other unavailable to confirm appointment but member state it is next month.   Update 4/11 - Spoke with member and fiance, state member is "ok."  He has continued to have complications of his metastatic cancer, such as weakness, pain, and some forgetfulness.  They are still hopeful of a recovery, next chemo session scheduled for 4/20.  Member active palliative care, last visit was on 3/9, fiance report they will revisit the end  of the month.  He has continued to lose weight due to decreased appetite.  Member report he is mostly only able to keep down mashed potatoes.  Encouraged to consider nutritional supplements such as Glucerna.  Was seen by endocrinology on 4/5, A1C increased greater then 8%.  [name] and Humalog doses increased however fiance state member has been experiencing some low readings since the increase.  Readings have ranged from a low of 65 to a high of greater than 300.  Today's reading was 152 this morning.  Fiance will discuss concern with endocrinology (follow up scheduled for June) as well as with [facility] as they are scheduled to visit the home this afternoon.     Update  4/28 - Member's fiance state he is still not doing that well since discharge from hospital.  He is still having bouts of confusion and weakness.  Unclear if this is from lingering affects of the UTI or side effects of chemo which he received on 4/24.  Primary Provider/[facility] was in the home yesterday for hospital follow up, home health evaluation has been done this week and therapy sessions will resume next week.  He was supposed to be seen by palliative care last week but was hospitalized, they have rescheduled for the within the next couple weeks.  Blood sugars remain elevated at time, range 150-400 since discharge, insulin doses adjusted by collaboration of endocrinology and [facility].  Member and fiance are still hopeful for improvement with cancer treatment, next infusion scheduled for next week.   Update 5/9 - Fiance report member is doing better.  He was given an additional course of antibiotic to treat the UTI, feel this has helped a lot although member is still slightly confused.  This may be caused by complications of cancer treatment and/or brain mets.  PSA remains elevated at 499, plan is to continue treatment.  [facility] in the home with weekly primary care assessments and collaboration with oncology and  endocrinology for plan of care.  Palliative care remains involved as well.   Update 5/31 - Admitted to hospital 5/25-5/30 for GI bleed.  PRBCs were given and Endoscopy was done, per notes bleeding ulcer was found.  Per significant other, Stephen Lynch, it was "lasered and clamped."  She report no further bleeding since procedure and discharge.  He will follow up with GI.  She will also notify home health as well as PCP to restart services and make home visit.  Per chart, member's quality of life was discussed, they wanted to continue treatment but member was made a DNR.  [facility] palliative care will see member in the home on 6/19.  Endocrinology follow up on 6/2 for labs and on 6/6 for office visit.  Stephen Lynch report member is "better" since discharge.  Continues to work to improve strength.  Discussed overall decline in member's status, she state they are "hopeful, taking it one day at a time."        Plan:  Telephone follow up appointment with care management team member scheduled for:  1 month The patient has been provided with contact information for the care management team and has been advised to call with any health related questions or concerns.   [name], RN, MSN, [place] Manager [phone]

## 2021-07-12 NOTE — Discharge Summary (Signed)
Physician Discharge Summary  Stephen Lynch WNI:[id number] DOB: [birth year] DOA: 07/05/2021  PCP: [facility]  Admit date: 07/05/2021 Discharge date: 07/10/2021  Admitted From: home Disposition:  home  Recommendations for Outpatient Follow-up:  Follow up with PCP in 1-2 weeks Please obtain BMP/CBC in one week Follow up with Dr. Benson Norway in 2 weeks Follow up with Dr. Alen Blew as scheduled Continue outpatient palliative care follow up  Home Health:home health PT/OT Equipment/Devices:wheelchair  Discharge Condition:stable CODE STATUS:DNR Diet recommendation: heart healthy, carb modified  Brief/Interim Summary: 76 y/o male with history of stage 4 prostate CA on chemo, IDDM, admitted to the hospital with melena, acute blood loss anemia, AKI and metabolic acidosis. He was also noted to be coagulopathic with INR of 2.1, no reported history of cirrhosis. Baseline hemoglobin noted to be 10.7 on 5/16, admission hemoglobin 5.6. He received 2 unit prbc and FFP and follow up hemoglobin 3.3. He received another 4 unit prbc. GI following. He underwent EGD that showed gastric ulcer s/p Epi, BICAP, and hemoclipping.  He has since stabilized  Discharge Diagnoses:  Principal Problem:   Acute blood loss anemia Active Problems:   Dyslipidemia   Diabetes mellitus without complication (HCC)   Goals of care, counseling/discussion   Uncontrolled type 2 diabetes mellitus with hyperglycemia, with long-term current use of insulin (HCC)   Malignant neoplasm of prostate metastatic to bone (HCC)   Hypoalbuminemia due to protein-calorie malnutrition (HCC)   AKI (acute kidney injury) ([place])   Melena   Leukocytosis   Metabolic acidosis   Coagulopathy (HCC)   Metastases to the liver (HCC)   Thrombocytopenia (HCC)  Acute blood loss anemia -Patient's hemoglobin was 10.7 on 5/16 -Admission hemoglobin noted to be 5.6 -2 units PRBC were transfused on admission -follow up hemoglobin 3.3 (confirmed on  recheck per staff) -He received a total of 7 unit prbc during his hospitalization -Hemoglobin currently stable at 9.2    Melena secondary to bleeding gastric ulcer -GI following -Continue with volume resuscitation -No history of NSAIDs -EGD on 5/26: gastric ulcer s/p Epi, BICAP, and hemoclipping. -Completed course of PPI infusion, now on Protonix twice daily -he will need to continue bid ppi for the next weeks then once daily thereafter -continue carafate for the next 4 weeks -Diet advanced to solid foods -BUN has been trending down -He will need GI follow-up in 2 weeks   Leukocytosis -Suspect this is stress response from rapid GI bleed -Since he appears to be having shivering and is immunocompromised from chemotherapy, he was covered with broad-spectrum antibiotics -Chest x-ray without evidence of pneumonia -WBC 48.2 -> 12.8 -he has been afebrile and does not appear toxic -blood culture/urine culture without any growth to date -Antibiotics discontinued   Thrombocytopenia -possibly related to consumption from large GI bleed -continue to follow   Coagulopathy -Etiology is unclear but possibly related to liver mets  -Admission INR 2.1 -No reported history of cirrhosis -RUQ ultrasound did not show liver changes consistent with cirrhosis, portal vein was patent -CT abdomen comments on multiple possible liver mets -Received vitamin K/FFP 5/25 -current INR 1.5   Acute kidney injury -Likely related to volume depletion/anemia -creatinine 1.4 on admission -creatinine improved with IV fluids, currently 0.79   Normal anion gap metabolic acidosis -Likely related to vomiting/GI losses -resolved with bicarbonate infusion   Hypoalbuminemia -Suspect related to poor nutritional status -seen by dietician -continue megace   Stage IV prostate cancer with mets to bone -CT abd shows possible liver mets -Followed by Dr.  Shadad, current treatment is palliative -follow up with Dr.  Alen Blew after discharge   Diabetes mellitus type 2, insulin-dependent, uncontrolled with hyperglycemia -recent a1c 8.4 in 05/2021 -would resume home regimen on discharge   Hyperlipidemia -continue statin   Generalized weakness -PT/OT with recommendations for skilled nursing facility. -Family and patient feels that they have enough support to care for him at home -he was discharged with [facility] PT/OT   Goals of care -Followed by palliative care as an outpatient -I had a lengthy discussion with patient and his fiance, [name] -[name] reports that she is patient's power of attorney and will bring in appropriate paperwork -We discussed his current medical comorbidities and expected prognosis in the event of a cardiopulmonary arrest -After discussion, patient agrees with DNR status at this point. -palliative care consulted and following in the hospital    Discharge Instructions  Discharge Instructions     Diet - low sodium heart healthy   Complete by: As directed    Increase activity slowly   Complete by: As directed       Allergies as of 07/10/2021       Reactions   Chlorhexidine    Unknown reaction   Enalapril    Other reaction(s): Unknown        Medication List     STOP taking these medications    ASPIRIN 81 PO   lidocaine-prilocaine cream Commonly known as: EMLA   megestrol 625 MG/5ML suspension Commonly known as: MEGACE ES   senna-docusate 8.6-50 MG tablet Commonly known as: Senokot-S       TAKE these medications    (feeding supplement) PROSource Plus liquid Take 30 mLs by mouth 2 (two) times daily between meals.   acetaminophen 500 MG tablet Commonly known as: TYLENOL Take 1,000 mg by mouth daily as needed for moderate pain.   Calcium-Magnesium-Zinc 333-133-5 MG Tabs Take 1 tablet by mouth at bedtime.   famotidine 20 MG tablet Commonly known as: PEPCID Take 20 mg by mouth daily.   ferrous sulfate 325 (65 FE) MG tablet Take 1 tablet  (325 mg total) by mouth daily.   FreeStyle Libre 2 Sensor Misc APPLY EVERY 14 DAYS, CHECK GLUCOSE BEFORE EACH MEAL, 2 HRS AFTER, IN THE MORNING AND BEFORE BEDTIME   HumaLOG KwikPen 100 UNIT/ML KwikPen Generic drug: insulin lispro Inject 3-11 Units into the skin in the morning, at noon, and at bedtime. CBG 70 - 120: 0 units  CBG 121 - 150: 3 units  CBG 151 - 200: 4 units  CBG 201 - 250: 7 units  CBG 251 - 300: 11 units   CBG more than 350 call PCP   lidocaine 5 % Commonly known as: LIDODERM Place 2 patches onto the skin daily. Remove & Discard patch within 12 hours or as directed by MD   loperamide 1 MG/5ML solution Commonly known as: IMODIUM Take 2 mg by mouth daily as needed for diarrhea or loose stools.   magnesium 30 MG tablet Take 30 mg by mouth daily.   megestrol 400 MG/10ML suspension Commonly known as: MEGACE Take 10 mLs (400 mg total) by mouth 2 (two) times daily.   metoprolol tartrate 25 MG tablet Commonly known as: LOPRESSOR Take 1 tablet (25 mg total) by mouth 2 (two) times daily.   nystatin 100000 UNIT/ML suspension Commonly known as: MYCOSTATIN Take 5 mLs (500,000 Units total) by mouth 4 (four) times daily.   ondansetron 4 MG tablet Commonly known as: ZOFRAN Take 1 tablet (4  mg total) by mouth every 6 (six) hours as needed. for nausea   Oxycodone HCl 10 MG Tabs Take 1 tablet (10 mg total) by mouth every 4 (four) hours as needed. What changed:  reasons to take this Another medication with the same name was removed. Continue taking this medication, and follow the directions you see here.   pantoprazole 40 MG tablet Commonly known as: PROTONIX Take 1 tablet (40 mg total) by mouth 2 (two) times daily before a meal.   prochlorperazine 10 MG tablet Commonly known as: COMPAZINE Take 1 tablet (10 mg total) by mouth every 6 (six) hours as needed.   rosuvastatin 10 MG tablet Commonly known as: CRESTOR Take 1 tablet (10 mg total) by mouth daily.    sucralfate 1 g tablet Commonly known as: Carafate Take 1 tablet (1 g total) by mouth 4 (four) times daily.   TIGER BALM ARTHRITIS RUB EX Apply 1 application topically 2 (two) times daily as needed (back pain).   [name] FlexTouch 100 UNIT/ML FlexTouch Pen Generic drug: insulin degludec Inject 20 Units into the skin at bedtime.        Follow-up Information     Carol Ada, MD. Schedule an appointment as soon as possible for a visit in 2 week(s).   Specialty: Gastroenterology Contact information: [place], [place] [address] [phone]         [name], MD Follow up.   Specialty: Oncology Why: follow up as previously scheduled Contact information: [place] [address] [place]. Schedule an appointment as soon as possible for a visit in 2 week(s).   Contact information: [place] [address] [phone]                Allergies  Allergen Reactions   Chlorhexidine     Unknown reaction   Enalapril     Other reaction(s): Unknown    Consultations: GI, Dr. Benson Norway   Procedures/Studies: DG CHEST PORT 1 VIEW  Result Date: 07/07/2021 CLINICAL DATA:  Shortness of breath. EXAM: PORTABLE CHEST 1 VIEW COMPARISON:  May 25, 23. FINDINGS: No consolidation. Low lung volumes. No visible pleural effusions or pneumothorax. Cardiomediastinal silhouette is similar to prior and within normal limits. Similar position of right IJ approach Port-A-Cath with the tip projecting at the SVC. Similar diffuse osseous sclerosis. IMPRESSION: No evidence of acute cardiopulmonary disease. Electronically Signed   By: Margaretha Sheffield M.D.   On: 07/07/2021 10:42   DG Chest Port 1 View  Result Date: 07/05/2021 CLINICAL DATA:  Shortness of breath. EXAM: PORTABLE CHEST 1 VIEW COMPARISON:  May 25, 2021. FINDINGS: The heart size and mediastinal contours are within normal  limits. Both lungs are clear. Right internal jugular Port-A-Cath is unchanged in position. Stable diffuse sclerosis of visualized skeleton is noted consistent with metastatic disease. IMPRESSION: No acute cardiopulmonary abnormality is noted. Stable diffuse sclerosis of visualized skeleton consistent with metastatic disease. Electronically Signed   By: Marijo Conception M.D.   On: 07/05/2021 12:18   CT Angio Abd/Pel w/ and/or w/o  Result Date: 07/06/2021 CLINICAL DATA:  GI bleed and anemia with both melena and hematochezia. History metastatic prostate carcinoma. EXAM: CT ANGIOGRAPHY ABDOMEN AND PELVIS WITH CONTRAST AND WITHOUT CONTRAST TECHNIQUE: Multidetector CT imaging of the abdomen and pelvis was performed using the standard protocol during bolus administration of intravenous contrast. Multiplanar reconstructed images and MIPs were obtained and  reviewed to evaluate the vascular anatomy. RADIATION DOSE REDUCTION: This exam was performed according to the departmental dose-optimization program which includes automated exposure control, adjustment of the mA and/or kV according to patient size and/or use of iterative reconstruction technique. CONTRAST:  16m OMNIPAQUE IOHEXOL 350 MG/ML SOLN COMPARISON:  PET scan on 11/14/2020 FINDINGS: All phases of the study are limited by patient motion artifact. VASCULAR Aorta: Normally patent. No significant atherosclerosis with minimal calcified plaque in the distal abdominal aorta. No aneurysm or dissection. Celiac: Normally patent.  Normally patent branch vessels. SMA: Normally patent. Replaced right hepatic artery off of the proximal SMA demonstrates normal patency. Renals: Normally patent bilateral single renal arteries. IMA: Normally patent. Inflow: Normally patent bilateral iliac arteries. Proximal Outflow: Calcified plaque in bilateral common femoral arteries without significant stenosis. Normally patent bilateral femoral bifurcations. Veins: Venous phase imaging  demonstrates normal patency of major venous structures including the IVC, renal veins, hepatic veins, portal vein, splenic vein, mesenteric veins, iliac veins and common femoral veins. Review of the MIP images confirms the above findings. NON-VASCULAR Lower chest: No acute abnormality. Hepatobiliary: New multifocal areas of low density in both right and left lobes of the liver since 2022 with the largest measuring approximately 3 cm in the inferior right lobe. Findings are consistent with metastatic disease. The gallbladder is unremarkable and there is no biliary ductal dilatation. Pancreas: Atrophic pancreas with stable chronic diffuse ductal dilatation and prominence. Spleen: Normal in size without focal abnormality. Adrenals/Urinary Tract: Adrenal glands are unremarkable. Kidneys are normal, without renal calculi, focal lesion, or hydronephrosis. Bladder is unremarkable. Stomach/Bowel: There is a suggestion of a focus increased density in a rounded configuration within the distal stomach on the arterial phase of imaging which is not present on the pre contrast images. This does not increased significantly in size on the venous phase but is suspicious for a focus of acute hemorrhage in the region of the gastric antrum. No overt ulceration or associated mass identified by CT. No other regions hemorrhage identified within the gastrointestinal tract by CTA. Lymphatic: No enlarged abdominal or pelvic lymph nodes. Reproductive: No prostate enlargement. Other: No abdominal wall hernia or abnormality. No abdominopelvic ascites. Musculoskeletal: Diffuse skeletal metastatic disease again known from known widespread metastatic prostate carcinoma. Bony disease is largely sclerotic with focal areas lytic disease also present. No vertebral body collapse or visible pathologic fractures. IMPRESSION: 1. Small focus of probable contrast extravasation on the arterial phase of imaging in the antrum of the stomach is suspicious for  focal bleeding. This is not evident on the unenhanced images and does not increased significantly on the venous phase. No obvious associated ulceration or mass by CT. 2. New multiple lesions in both right and left lobes of the liver are consistent with hepatic metastatic disease. 3. Stable diffuse skeletal metastatic disease from known metastatic prostate carcinoma. 4. These results will be called to the ordering clinician or representative by the Radiologist Assistant, and communication documented in the PACS or [employer] Electronically Signed   By: GAletta EdouardM.D.   On: 07/06/2021 11:52   UKoreaAbdomen Limited RUQ (LIVER/GB)  Result Date: 07/05/2021 CLINICAL DATA:  Elevated LFTs. EXAM: ULTRASOUND ABDOMEN LIMITED RIGHT UPPER QUADRANT COMPARISON:  PET-CT 11/14/2020. CT abdomen and pelvis 03/23/2019. FINDINGS: Gallbladder: Not visualized.  No sonographic Murphy sign noted by sonographer. Common bile duct: Diameter: 8.2 mm. Liver: No focal lesion identified. Parenchymal echogenicity is diffusely increased in diffusely heterogeneous. Portal vein is patent on color Doppler imaging with normal  direction of blood flow towards the liver. Other: None. IMPRESSION: 1. The gallbladder is not visualized. The common bile duct is mildly prominent in size measuring up 8 mm. Recommend correlation with lab values. (This has significantly decreased in size compared to 02/09 1,021). 2. Heterogeneous echogenic liver likely related to diffuse hepatocellular disease. Electronically Signed   By: Ronney Asters M.D.   On: 07/05/2021 17:18      Subjective: He is feeling better. No vomiting. No further bloody stools  Discharge Exam: Vitals:   07/09/21 1347 07/09/21 2136 07/10/21 0428 07/10/21 1353  BP: (!) 120/53 135/74 112/73 131/72  Pulse: 72 83 71 72  Resp: '16 14 14 17  '$ Temp:  98 F (36.7 C) 98 F (36.7 C) 97.8 F (36.6 C)  TempSrc:  Oral Oral Oral  SpO2:  100% 100% 100%  Weight:      Height:         General: Pt is alert, awake, not in acute distress Cardiovascular: RRR, S1/S2 +, no rubs, no gallops Respiratory: CTA bilaterally, no wheezing, no rhonchi Abdominal: Soft, NT, ND, bowel sounds + Extremities: no edema, no cyanosis    The results of significant diagnostics from this hospitalization (including imaging, microbiology, ancillary and laboratory) are listed below for reference.     Microbiology: Recent Results (from the past 240 hour(s))  Culture, blood (Routine X 2) w Reflex to ID Panel     Status: None   Collection Time: 07/05/21  5:05 PM   Specimen: BLOOD  Result Value Ref Range Status   Specimen Description   Final    BLOOD RIGHT ANTECUBITAL Performed at [facility] [address] [place], [place] [address]    Special Requests   Final    IN PEDIATRIC BOTTLE Blood Culture adequate volume Performed at [facility] [address], [place], [place] [address]    Culture   Final    NO GROWTH 5 DAYS Performed at [facility], [place] [address], [place], [place] [address]    Report Status 07/10/2021 FINAL  Final  Urine Culture     Status: Abnormal   Collection Time: 07/05/21  7:24 PM   Specimen: Urine, Clean Catch  Result Value Ref Range Status   Specimen Description   Final    URINE, CLEAN CATCH Performed at [facility], [place] [address] [place], [place] [address]    Special Requests   Final    NONE Performed at [facility], [place] [address], [place], [place] [address]    Culture (A)  Final    <10,000 COLONIES/mL INSIGNIFICANT GROWTH Performed at [place] [address], [place], [place] [address]    Report Status 07/06/2021 FINAL  Final     Labs: BNP (last 3 results) Recent Labs    05/26/21 1215  BNP 58.8   Basic Metabolic Panel: Recent Labs  Lab 07/05/21 1413 07/06/21 0635 07/07/21 0500 07/08/21 0448 07/09/21 0441  NA 144 144 145 137 142  K 4.3  3.9 3.8 3.6 3.4*  CL 115* 116* 116* 109 115*  CO2 12* 22 23 21* 23  GLUCOSE 177* 185* 183* 354* 103*  BUN 34* 37* 33* 25* 18  CREATININE 1.43* 1.17 1.12 0.92 0.79  CALCIUM 8.1* 7.4* 7.7* 7.7* 8.0*   Liver Function Tests: Recent Labs  Lab 07/05/21 1413 07/06/21 0815 07/07/21 0500 07/08/21 0448 07/09/21 0441  AST 55* 132* 81* 42* 47*  ALT 23 34 32  23 26  ALKPHOS 379* 284* 278* 221* 243*  BILITOT 1.0 1.0 1.6* 1.0 1.2  PROT 4.5* 4.5* 4.4* 4.6* 4.5*  ALBUMIN 2.0* 2.5* 2.3* 2.6* 2.4*   No results for input(s): LIPASE, AMYLASE in the last 168 hours. Recent Labs  Lab 07/05/21 1229 07/06/21 0815 07/07/21 0514 07/08/21 0540  AMMONIA 73* 58* 54* 52*   CBC: Recent Labs  Lab 07/05/21 1229 07/05/21 1236 07/07/21 2227 07/08/21 0448 07/08/21 1200 07/09/21 0441 07/10/21 0438  WBC 48.2*   < > 19.3* 15.2* 16.4* 14.6* 12.8*  NEUTROABS 35.7*  --   --   --   --   --   --   HGB 5.6*   < > 9.3* 8.3* 9.5* 9.2* 9.3*  HCT 18.2*   < > 27.4* 24.4* 29.1* 27.4* 28.2*  MCV 102.2*   < > 90.1 89.7 92.4 90.7 93.1  PLT 189   < > 83* 70* 77* 74* 88*   < > = values in this interval not displayed.   Cardiac Enzymes: No results for input(s): CKTOTAL, CKMB, CKMBINDEX, TROPONINI in the last 168 hours. BNP: Invalid input(s): POCBNP CBG: Recent Labs  Lab 07/09/21 1700 07/09/21 2138 07/10/21 0724 07/10/21 1139 07/10/21 1621  GLUCAP 310* 265* 208* 253* 318*   D-Dimer No results for input(s): DDIMER in the last 72 hours. Hgb A1c No results for input(s): HGBA1C in the last 72 hours. Lipid Profile No results for input(s): CHOL, HDL, LDLCALC, TRIG, CHOLHDL, LDLDIRECT in the last 72 hours. Thyroid function studies No results for input(s): TSH, T4TOTAL, T3FREE, THYROIDAB in the last 72 hours.  Invalid input(s): FREET3 Anemia work up No results for input(s): VITAMINB12, FOLATE, FERRITIN, TIBC, IRON, RETICCTPCT in the last 72 hours. Urinalysis    Component Value Date/Time   COLORURINE YELLOW  07/05/2021 1924   APPEARANCEUR HAZY (A) 07/05/2021 1924   LABSPEC 1.017 07/05/2021 1924   PHURINE 5.0 07/05/2021 1924   GLUCOSEU NEGATIVE 07/05/2021 1924   HGBUR MODERATE (A) 07/05/2021 1924   BILIRUBINUR NEGATIVE 07/05/2021 [place] NEGATIVE 07/05/2021 1924   PROTEINUR 30 (A) 07/05/2021 1924   NITRITE NEGATIVE 07/05/2021 1924   LEUKOCYTESUR SMALL (A) 07/05/2021 1924   Sepsis Labs Invalid input(s): PROCALCITONIN,  WBC,  LACTICIDVEN Microbiology Recent Results (from the past 240 hour(s))  Culture, blood (Routine X 2) w Reflex to ID Panel     Status: None   Collection Time: 07/05/21  5:05 PM   Specimen: BLOOD  Result Value Ref Range Status   Specimen Description   Final    BLOOD RIGHT ANTECUBITAL Performed at [facility], [place] [address] [place], [place] [address]    Special Requests   Final    IN PEDIATRIC BOTTLE Blood Culture adequate volume Performed at [facility] [address] [place], [place] [address]    Culture   Final    NO GROWTH 5 DAYS Performed at [facility], [place] [address], [place], [place] [address]    Report Status 07/10/2021 FINAL  Final  Urine Culture     Status: Abnormal   Collection Time: 07/05/21  7:24 PM   Specimen: Urine, Clean Catch  Result Value Ref Range Status   Specimen Description   Final    URINE, CLEAN CATCH Performed at [facility], [place] [address], [place], [place] [address]    Special Requests   Final    NONE Performed at [facility], [address] [place],  [place] [address]    Culture (A)  Final    <10,000 COLONIES/mL INSIGNIFICANT GROWTH Performed at [facility], [place] [address], [place], [place] [address]    Report Status 07/06/2021 FINAL  Final     Time coordinating discharge: 26mns  SIGNED:   JKathie Dike MD  [facility] 07/12/2021, 9:16 AM   If 7PM-7AM, please contact night-coverage [email or web address]

## 2021-07-13 ENCOUNTER — Other Ambulatory Visit (INDEPENDENT_AMBULATORY_CARE_PROVIDER_SITE_OTHER): Payer: Medicare Other

## 2021-07-13 DIAGNOSIS — C7931 Secondary malignant neoplasm of brain: Secondary | ICD-10-CM | POA: Diagnosis not present

## 2021-07-13 DIAGNOSIS — E1165 Type 2 diabetes mellitus with hyperglycemia: Secondary | ICD-10-CM

## 2021-07-13 DIAGNOSIS — B3781 Candidal esophagitis: Secondary | ICD-10-CM | POA: Diagnosis not present

## 2021-07-13 DIAGNOSIS — K250 Acute gastric ulcer with hemorrhage: Secondary | ICD-10-CM | POA: Diagnosis not present

## 2021-07-13 DIAGNOSIS — Z794 Long term (current) use of insulin: Secondary | ICD-10-CM

## 2021-07-13 DIAGNOSIS — R634 Abnormal weight loss: Secondary | ICD-10-CM | POA: Diagnosis not present

## 2021-07-13 DIAGNOSIS — K219 Gastro-esophageal reflux disease without esophagitis: Secondary | ICD-10-CM | POA: Diagnosis not present

## 2021-07-13 DIAGNOSIS — C61 Malignant neoplasm of prostate: Secondary | ICD-10-CM | POA: Diagnosis not present

## 2021-07-13 LAB — BASIC METABOLIC PANEL
BUN: 13 mg/dL (ref 6–23)
CO2: 26 mEq/L (ref 19–32)
Calcium: 8.2 mg/dL — ABNORMAL LOW (ref 8.4–10.5)
Chloride: 110 mEq/L (ref 96–112)
Creatinine, Ser: 0.7 mg/dL (ref 0.40–1.50)
GFR: 90.81 mL/min (ref >60.00)
Glucose, Bld: 140 mg/dL — ABNORMAL HIGH (ref 70–99)
Potassium: 3.6 mEq/L (ref 3.5–5.1)
Sodium: 142 mEq/L (ref 135–145)

## 2021-07-14 LAB — FRUCTOSAMINE: Fructosamine: 234 umol/L (ref 0–285)

## 2021-07-16 ENCOUNTER — Other Ambulatory Visit (HOSPITAL_COMMUNITY): Payer: Medicare Other

## 2021-07-16 ENCOUNTER — Ambulatory Visit: Payer: Medicare Other | Admitting: *Deleted

## 2021-07-17 ENCOUNTER — Ambulatory Visit: Payer: Medicare Other | Admitting: Endocrinology

## 2021-07-17 ENCOUNTER — Ambulatory Visit: Payer: Medicare Other | Admitting: *Deleted

## 2021-07-17 MED FILL — Dexamethasone Sodium Phosphate Inj 100 MG/10ML: INTRAMUSCULAR | Qty: 1 | Status: AC

## 2021-07-18 ENCOUNTER — Inpatient Hospital Stay: Payer: Medicare Other | Admitting: Dietician

## 2021-07-18 ENCOUNTER — Inpatient Hospital Stay: Payer: Medicare Other

## 2021-07-18 ENCOUNTER — Other Ambulatory Visit: Payer: Self-pay

## 2021-07-18 ENCOUNTER — Inpatient Hospital Stay: Payer: Medicare Other | Attending: Oncology

## 2021-07-18 ENCOUNTER — Inpatient Hospital Stay (HOSPITAL_BASED_OUTPATIENT_CLINIC_OR_DEPARTMENT_OTHER): Payer: Medicare Other | Admitting: Oncology

## 2021-07-18 VITALS — BP 138/63 | HR 82 | Temp 97.7°F | Resp 18 | Ht 73.0 in | Wt 208.6 lb

## 2021-07-18 DIAGNOSIS — C61 Malignant neoplasm of prostate: Secondary | ICD-10-CM

## 2021-07-18 DIAGNOSIS — C7951 Secondary malignant neoplasm of bone: Secondary | ICD-10-CM | POA: Diagnosis not present

## 2021-07-18 DIAGNOSIS — Z923 Personal history of irradiation: Secondary | ICD-10-CM | POA: Diagnosis not present

## 2021-07-18 DIAGNOSIS — Z79899 Other long term (current) drug therapy: Secondary | ICD-10-CM | POA: Diagnosis not present

## 2021-07-18 DIAGNOSIS — D649 Anemia, unspecified: Secondary | ICD-10-CM | POA: Diagnosis not present

## 2021-07-18 DIAGNOSIS — Z95828 Presence of other vascular implants and grafts: Secondary | ICD-10-CM

## 2021-07-18 LAB — CBC WITH DIFFERENTIAL (CANCER CENTER ONLY)
Abs Immature Granulocytes: 0.1 10*3/uL — ABNORMAL HIGH (ref 0.00–0.07)
Basophils Absolute: 0 10*3/uL (ref 0.0–0.1)
Basophils Relative: 0 %
Eosinophils Absolute: 0 10*3/uL (ref 0.0–0.5)
Eosinophils Relative: 0 %
HCT: 25.8 % — ABNORMAL LOW (ref 39.0–52.0)
Hemoglobin: 8.4 g/dL — ABNORMAL LOW (ref 13.0–17.0)
Immature Granulocytes: 1 %
Lymphocytes Relative: 5 %
Lymphs Abs: 0.4 10*3/uL — ABNORMAL LOW (ref 0.7–4.0)
MCH: 30.4 pg (ref 26.0–34.0)
MCHC: 32.6 g/dL (ref 30.0–36.0)
MCV: 93.5 fL (ref 80.0–100.0)
Monocytes Absolute: 0.6 10*3/uL (ref 0.1–1.0)
Monocytes Relative: 8 %
Neutro Abs: 6.7 10*3/uL (ref 1.7–7.7)
Neutrophils Relative %: 86 %
Platelet Count: 80 10*3/uL — ABNORMAL LOW (ref 150–400)
RBC: 2.76 MIL/uL — ABNORMAL LOW (ref 4.22–5.81)
RDW: 21.2 % — ABNORMAL HIGH (ref 11.5–15.5)
WBC Count: 7.9 10*3/uL (ref 4.0–10.5)
nRBC: 0.3 % — ABNORMAL HIGH (ref 0.0–0.2)

## 2021-07-18 LAB — CMP (CANCER CENTER ONLY)
ALT: 25 U/L (ref 0–44)
AST: 49 U/L — ABNORMAL HIGH (ref 15–41)
Albumin: 2.8 g/dL — ABNORMAL LOW (ref 3.5–5.0)
Alkaline Phosphatase: 429 U/L — ABNORMAL HIGH (ref 38–126)
Anion gap: 7 (ref 5–15)
BUN: 19 mg/dL (ref 8–23)
CO2: 23 mmol/L (ref 22–32)
Calcium: 8.7 mg/dL — ABNORMAL LOW (ref 8.9–10.3)
Chloride: 110 mmol/L (ref 98–111)
Creatinine: 0.93 mg/dL (ref 0.61–1.24)
GFR, Estimated: >60 mL/min (ref >60)
Glucose, Bld: 205 mg/dL — ABNORMAL HIGH (ref 70–99)
Potassium: 3.4 mmol/L — ABNORMAL LOW (ref 3.5–5.1)
Sodium: 140 mmol/L (ref 135–145)
Total Bilirubin: 1 mg/dL (ref 0.3–1.2)
Total Protein: 5.4 g/dL — ABNORMAL LOW (ref 6.5–8.1)

## 2021-07-18 LAB — SAMPLE TO BLOOD BANK

## 2021-07-18 MED ORDER — SODIUM CHLORIDE 0.9% FLUSH
10.0000 mL | Freq: Once | INTRAVENOUS | Status: AC
  Administered 2021-07-18: 10 mL

## 2021-07-18 NOTE — Progress Notes (Signed)
Hematology and Oncology Follow Up Visit  Stephen Lynch [id number] 07-Jan-1946 76 y.o. 07/18/2021 11:50 AM [facility]   Principle Diagnosis: 76 year old man with advanced prostate cancer with disease to the bone diagnosed in February 2021.  He has castration-resistant at this time.  Prior Therapy:  He status post CT-guided biopsy of the right iliac bone.  [name] started on April 20, 2019.  Xtandi 160 mg daily started in April 2021.  Therapy discontinued in March 2022 due to progression of disease.  Zytiga 1000 mg daily with prednisone 5 mg daily started in March 2022.  Taxotere chemotherapy 75 mg per metered square started on June 08, 2020.  He is here for cycle 9 of therapy.  Radiation therapy to L3 and L4 bilateral shoulders completed in February 2023.  He received 30 Gray in 10 fractions.  Current therapy:    Eligard 30 mg started in April 2021.  Next Eligard will be given in July 2023.   Jevtana chemotherapy 20 mg per metered square every 3 weeks started on December 28, 2020.  He completed 9 cycles of therapy.     Interim History: Stephen Lynch returns today for a follow-up visit.  Since the last visit, he was hospitalized between May 25 and May 30 for symptomatic anemia, coagulopathy and GI bleeding.  Since his discharge, he reports no major complaints but he has continued to have functional decline.  His mobility is limited with increased lower extremity edema and overall deterioration in his performance status.  He had also episodic confusion but his pain is overall manageable.  His appetite is marginal.  Medications: Reviewed without changes. Current Outpatient Medications  Medication Sig Dispense Refill   acetaminophen (TYLENOL) 500 MG tablet Take 1,000 mg by mouth daily as needed for moderate pain.     Calcium-Magnesium-Zinc 333-133-5 MG TABS Take 1 tablet by mouth at bedtime.     Continuous Blood Gluc Sensor (FREESTYLE LIBRE 2  SENSOR) MISC APPLY EVERY 14 DAYS, CHECK GLUCOSE BEFORE EACH MEAL, 2 HRS AFTER, IN THE MORNING AND BEFORE BEDTIME 2 each 2   famotidine (PEPCID) 20 MG tablet Take 20 mg by mouth daily.     ferrous sulfate 325 (65 FE) MG tablet Take 1 tablet (325 mg total) by mouth daily. 30 tablet 3   HUMALOG KWIKPEN 100 UNIT/ML KwikPen Inject 3-11 Units into the skin in the morning, at noon, and at bedtime. CBG 70 - 120: 0 units  CBG 121 - 150: 3 units  CBG 151 - 200: 4 units  CBG 201 - 250: 7 units  CBG 251 - 300: 11 units   CBG more than 350 call PCP 15 mL 11   lidocaine (LIDODERM) 5 % Place 2 patches onto the skin daily. Remove & Discard patch within 12 hours or as directed by MD 30 patch 0   loperamide (IMODIUM) 1 MG/5ML solution Take 2 mg by mouth daily as needed for diarrhea or loose stools.     magnesium 30 MG tablet Take 30 mg by mouth daily.     megestrol (MEGACE) 400 MG/10ML suspension Take 10 mLs (400 mg total) by mouth 2 (two) times daily. 240 mL 0   Menthol-Camphor (TIGER BALM ARTHRITIS RUB EX) Apply 1 application topically 2 (two) times daily as needed (back pain).     metoprolol tartrate (LOPRESSOR) 25 MG tablet Take 1 tablet (25 mg total) by mouth 2 (two) times daily. 60 tablet 0   Nutritional Supplements (,FEEDING SUPPLEMENT, PROSOURCE PLUS)  liquid Take 30 mLs by mouth 2 (two) times daily between meals. (Patient not taking: Reported on 07/05/2021) 100 mL 0   nystatin (MYCOSTATIN) 100000 UNIT/ML suspension Take 5 mLs (500,000 Units total) by mouth 4 (four) times daily. 60 mL 0   ondansetron (ZOFRAN) 4 MG tablet Take 1 tablet (4 mg total) by mouth every 6 (six) hours as needed. for nausea 20 tablet 3   Oxycodone HCl 10 MG TABS Take 1 tablet (10 mg total) by mouth every 4 (four) hours as needed. (Patient taking differently: Take 10 mg by mouth every 4 (four) hours as needed (for pain).) 90 tablet 0   pantoprazole (PROTONIX) 40 MG tablet Take 1 tablet (40 mg total) by mouth 2 (two) times daily before  a meal. 60 tablet 1   prochlorperazine (COMPAZINE) 10 MG tablet Take 1 tablet (10 mg total) by mouth every 6 (six) hours as needed. (Patient not taking: Reported on 07/05/2021) 30 tablet 3   rosuvastatin (CRESTOR) 10 MG tablet Take 1 tablet (10 mg total) by mouth daily. (Patient not taking: Reported on 07/05/2021) 90 tablet 3   sucralfate (CARAFATE) 1 g tablet Take 1 tablet (1 g total) by mouth 4 (four) times daily. 120 tablet 0   TRESIBA FLEXTOUCH 100 UNIT/ML FlexTouch Pen Inject 20 Units into the skin at bedtime. 15 mL 0   No current facility-administered medications for this visit.     Allergies:  Allergies  Allergen Reactions   Chlorhexidine     Unknown reaction   Enalapril     Other reaction(s): Unknown      Physical Exam:      ECOG: 2     General appearance: Comfortable appearing without any discomfort Head: Normocephalic without any trauma Oropharynx: Mucous membranes are moist and pink without any thrush or ulcers. Eyes: Pupils are equal and round reactive to light. Lymph nodes: No cervical, supraclavicular, inguinal or axillary lymphadenopathy.   Heart:regular rate and rhythm.  S1 and S2 with bilateral lower extremity edema. Lung: Clear without any rhonchi or wheezes.  No dullness to percussion. Abdomin: Soft, nontender, nondistended with good bowel sounds.  No hepatosplenomegaly. Musculoskeletal: No joint deformity or effusion.  Full range of motion noted. Neurological: No deficits noted on motor, sensory and deep tendon reflex exam. Skin: No petechial rash or dryness.  Appeared moist.                     Lab Results: Lab Results  Component Value Date   WBC 12.8 (H) 07/10/2021   HGB 9.3 (L) 07/10/2021   HCT 28.2 (L) 07/10/2021   MCV 93.1 07/10/2021   PLT 88 (L) 07/10/2021     Chemistry      Component Value Date/Time   NA 142 07/13/2021 1129   K 3.6 07/13/2021 1129   CL 110 07/13/2021 1129   CO2 26 07/13/2021 1129   BUN 13 07/13/2021  1129   CREATININE 0.70 07/13/2021 1129   CREATININE 0.74 06/26/2021 0918      Component Value Date/Time   CALCIUM 8.2 (L) 07/13/2021 1129   ALKPHOS 243 (H) 07/09/2021 0441   AST 47 (H) 07/09/2021 0441   AST 52 (H) 06/26/2021 0918   ALT 26 07/09/2021 0441   ALT 30 06/26/2021 0918   BILITOT 1.2 07/09/2021 0441   BILITOT 1.2 06/26/2021 0918       Latest Reference Range & Units 04/11/21 11:11 05/03/21 11:21 06/04/21 10:03 06/26/21 09:18  Prostate Specific Ag, Serum 0.0 - 4.0  ng/mL 241.0 (H) 365.0 (H) 499.0 (H) 559.0 (H)  h    Impression and Plan:   76 year old man with:  1.  Advanced prostate cancer with disease to the bone and lymphadenopathy diagnosed in 2021.   He is currently on Jevtana chemotherapy although he has progressed by PSA criteria and clinical status continues to deteriorate.  The natural course of his disease was discussed today with the patient and his family.  He appears to be approaching end-stage status with overall functional decline and disease progression.  I have recommended after discussion today to stop all chemotherapy and transition to hospice.  He has exhausted all therapies although his tolerance to chemotherapy has been reasonable but the benefit continues to be very marginal.  Laboratory data from today reviewed and showed pancytopenia which likely will worsen with additional chemotherapy.  The patient and his family understood these recommendations and will consider hospice in the future.  We will continue to monitor clinically.   2.  Androgen deprivation: This will be stopped if he transitions to hospice.   3.  Growth factor support: He has not received growth factor support after each chemotherapy and will be discontinued with chemotherapy.  4.  Prognosis and goals of care: He has limited life expectancy at this time given his disease progression and overall decline.   5.  Anemia: Related to recent GI blood losses.  Hemoglobin is adequate  without any need for transfusion.  6.  Bone pain: Overall manageable at this time with oxycodone.  7.  IV access: Port-A-Cath remains in place and we will be flushed periodically in the future.  8. Follow-up: In the next 3 to 4 weeks to follow his progress.    30  minutes were spent on this encounter.  The time was dedicated to reviewing laboratory data, disease status update and outlining future plan of care discussion.  Zola Button, MD 6/7/202311:50 AM

## 2021-07-19 ENCOUNTER — Encounter: Payer: Self-pay | Admitting: Endocrinology

## 2021-07-19 ENCOUNTER — Ambulatory Visit (INDEPENDENT_AMBULATORY_CARE_PROVIDER_SITE_OTHER): Payer: Medicare Other | Admitting: Endocrinology

## 2021-07-19 VITALS — BP 158/88 | HR 57 | Ht 73.0 in | Wt 208.0 lb

## 2021-07-19 DIAGNOSIS — D500 Iron deficiency anemia secondary to blood loss (chronic): Secondary | ICD-10-CM | POA: Diagnosis not present

## 2021-07-19 DIAGNOSIS — I10 Essential (primary) hypertension: Secondary | ICD-10-CM | POA: Diagnosis not present

## 2021-07-19 DIAGNOSIS — R54 Age-related physical debility: Secondary | ICD-10-CM | POA: Diagnosis not present

## 2021-07-19 DIAGNOSIS — C61 Malignant neoplasm of prostate: Secondary | ICD-10-CM | POA: Diagnosis not present

## 2021-07-19 DIAGNOSIS — E1165 Type 2 diabetes mellitus with hyperglycemia: Secondary | ICD-10-CM

## 2021-07-19 DIAGNOSIS — J449 Chronic obstructive pulmonary disease, unspecified: Secondary | ICD-10-CM | POA: Diagnosis not present

## 2021-07-19 DIAGNOSIS — Z794 Long term (current) use of insulin: Secondary | ICD-10-CM

## 2021-07-19 DIAGNOSIS — E782 Mixed hyperlipidemia: Secondary | ICD-10-CM | POA: Diagnosis not present

## 2021-07-19 LAB — PROSTATE-SPECIFIC AG, SERUM (LABCORP): Prostate Specific Ag, Serum: 572 ng/mL — ABNORMAL HIGH (ref 0.0–4.0)

## 2021-07-19 NOTE — Patient Instructions (Addendum)
Tresiba 16  Humalog 5 Units after any meal

## 2021-07-19 NOTE — Progress Notes (Signed)
Patient ID: Stephen Lynch, male   DOB: Jul 13, 1945, 76 y.o.   MRN: [id number]           Reason for Appointment: Endocrinology follow-up   PCP: Dr. Criss Rosales   History of Present Illness:          Date of diagnosis of type 2 diabetes mellitus: 2007?       Background history:   He has been on insulin since about the time of diagnosis.  He thinks initially oral agents that were prescribed did not work No detailed history of his previous treatment is available and A1c from last year was over 12%  Recent history:   Most recent A1c is last 8.4 compared to 6%   INSULIN regimen is: [place] 20 U at bedtime, Humalog 10-12 at mealtimes  Non-insulin hypoglycemic drugs the patient is taking are: None  Current management, blood sugar patterns and problems identified: Although he was having significant difficulty controlling his sugars and needing more insulin in March he is not on any chemotherapy or infusions now and because of his deteriorating malignancy he has not been eating much  Although he has not lost any weight his blood sugars are much lower and only averaging 140 lately  Periodically his insulin has been adjusted based on his readings over the phone and now taking only 20 units of Tresiba and mostly 10 units Humalog at mealtimes or active blood sugars are expectedly high  However he still does tend to have low normal or low readings before breakfast and usually no rise in blood sugars after meals  Only occasionally will have late night hyperglycemia of unclear etiology although his wife thinks he may be eating something on his own He has used the freestyle libre only for the last week or so Previously had not taken [place] because of cost   Typical meal intake: Breakfast is usually cereal and almond milk or grits and eggs.  Lunch fruits, tuna or other seafood dinner is rice, vegetables and protein.                Snacks are nuts, fruits, ice cream, chocolate  Exercise: minimal, limited  by weakness  FREESTYLE libre review f 5/31 through 07/18/2021  HYPOGLYCEMIA is occurring periodically in the early morning hours between 6-9 AM HYPERGLYCEMIA is occurring only occasionally overnight and mid afternoon Generally his blood sugars are HIGHEST around midnight and lowest around 8 AM However data she was only available for 7 days POSTPRANDIAL hyperglycemia is generally minimal except occasionally after breakfast or lunch Unclear why he has significantly high readings last night after midnight until midday and also on 2 nights after midnight Low blood sugars are usually occurring only about 3 times in the last 7 days early morning and only once before dinnertime  CGM use % of time over 14 days 52   2-week average/GV 140  Time in range      73%  % Time Above 180 20+3  % Time above 250   % Time Below 70 4      PREVIOUS data:  Overall blood sugars are on an average in the upper end of the target range with moderate fluctuation  No consistent pattern is seen since blood sugar levels show sporadic episodes of HYPERGLYCEMIA at all different times including overnight  His best blood sugars are on 1/1 when they were fairly consistently within the target range at all times  Overnight blood sugars are mostly high averaging at least 150 with  some variability and occasional significant high readings usually starting before bedtime  Hypoglycemia has been minimal with only 1 episode at 2 AM preceded by a high blood sugar POSTPRANDIAL readings are in general Consistently however he has sporadic spikes after lunch or dinner as well as occasionally in  Highest blood sugars overall were on 12/30  CGM use % of time 100  2-week average/GV 168  Time in range 63  % Time Above 180 27  % Time above 250 9  % Time Below 70 1     PRE-MEAL Fasting Lunch Dinner Bedtime Overall  Glucose range:       Averages: 159 155 166     POST-MEAL PC Breakfast PC Lunch PC Dinner  Glucose range:      Averages: 182 159 197     Dietician visit, most recent: never  Weight history:  Wt Readings from Last 3 Encounters:  07/19/21 208 lb (94.3 kg)  07/18/21 208 lb 9.6 oz (94.6 kg)  07/05/21 192 lb 14.4 oz (87.5 kg)    Glycemic control:   Lab Results  Component Value Date   HGBA1C 8.4 (H) 05/14/2021   HGBA1C 6.0 02/13/2021   HGBA1C 8.5 (H) 08/18/2020   Lab Results  Component Value Date   LDLCALC 72 07/18/2020   CREATININE 0.93 07/18/2021   No results found for: "MICRALBCREAT"  Lab Results  Component Value Date   FRUCTOSAMINE 234 07/13/2021   FRUCTOSAMINE 290 (H) 02/13/2021   FRUCTOSAMINE 282 09/29/2020    Infusion on 07/18/2021  Component Date Value Ref Range Status   Blood Bank Specimen 07/18/2021 SAMPLE AVAILABLE FOR TESTING   Final   Sample Expiration 07/18/2021    Final                   Value:07/21/2021,2359 Performed at [facility], [place] [address] [place], [place] [address]    Prostate Specific Ag, Serum 07/18/2021 572.0 (H)  0.0 - 4.0 ng/mL Final   Comment: (NOTE) Results confirmed on dilution. Roche ECLIA methodology. According to the American Urological Association, Serum PSA should decrease and remain at undetectable levels after radical prostatectomy. The AUA defines biochemical recurrence as an initial PSA value 0.2 ng/mL or greater followed by a subsequent confirmatory PSA value 0.2 ng/mL or greater. Values obtained with different assay methods or kits cannot be used interchangeably. Results cannot be interpreted as absolute evidence of the presence or absence of malignant disease. Performed At: [facility] [place], [place] [id number] Rush Farmer MD DG:[id number]    Sodium 07/18/2021 140  135 - 145 mmol/L Final   Potassium 07/18/2021 3.4 (L)  3.5 - 5.1 mmol/L Final   Chloride 07/18/2021 110  98 - 111 mmol/L Final   CO2 07/18/2021 23  22 - 32 mmol/L Final   Glucose, Bld 07/18/2021 205 (H)  70 -  99 mg/dL Final   Glucose reference range applies only to samples taken after fasting for at least 8 hours.   BUN 07/18/2021 19  8 - 23 mg/dL Final   Creatinine 07/18/2021 0.93  0.61 - 1.24 mg/dL Final   Calcium 07/18/2021 8.7 (L)  8.9 - 10.3 mg/dL Final   Total Protein 07/18/2021 5.4 (L)  6.5 - 8.1 g/dL Final   Albumin 07/18/2021 2.8 (L)  3.5 - 5.0 g/dL Final   AST 07/18/2021 49 (H)  15 - 41 U/L Final   ALT 07/18/2021 25  0 - 44 U/L Final   Alkaline Phosphatase 07/18/2021 429 (  H)  38 - 126 U/L Final   Total Bilirubin 07/18/2021 1.0  0.3 - 1.2 mg/dL Final   GFR, Estimated 07/18/2021 >60  >60 mL/min Final   Comment: (NOTE) Calculated using the CKD-EPI Creatinine Equation (2021)    Anion gap 07/18/2021 7  5 - 15 Final   Performed at [facility], [place] [address], [place], [place] [address]   WBC Count 07/18/2021 7.9  4.0 - 10.5 K/uL Final   RBC 07/18/2021 2.76 (L)  4.22 - 5.81 MIL/uL Final   Hemoglobin 07/18/2021 8.4 (L)  13.0 - 17.0 g/dL Final   HCT 07/18/2021 25.8 (L)  39.0 - 52.0 % Final   MCV 07/18/2021 93.5  80.0 - 100.0 fL Final   MCH 07/18/2021 30.4  26.0 - 34.0 pg Final   MCHC 07/18/2021 32.6  30.0 - 36.0 g/dL Final   RDW 07/18/2021 21.2 (H)  11.5 - 15.5 % Final   Platelet Count 07/18/2021 80 (L)  150 - 400 K/uL Final   nRBC 07/18/2021 0.3 (H)  0.0 - 0.2 % Final   Neutrophils Relative % 07/18/2021 86  % Final   Neutro Abs 07/18/2021 6.7  1.7 - 7.7 K/uL Final   Lymphocytes Relative 07/18/2021 5  % Final   Lymphs Abs 07/18/2021 0.4 (L)  0.7 - 4.0 K/uL Final   Monocytes Relative 07/18/2021 8  % Final   Monocytes Absolute 07/18/2021 0.6  0.1 - 1.0 K/uL Final   Eosinophils Relative 07/18/2021 0  % Final   Eosinophils Absolute 07/18/2021 0.0  0.0 - 0.5 K/uL Final   Basophils Relative 07/18/2021 0  % Final   Basophils Absolute 07/18/2021 0.0  0.0 - 0.1 K/uL Final   Immature Granulocytes 07/18/2021 1  % Final   Abs Immature Granulocytes 07/18/2021 0.10 (H)   0.00 - 0.07 K/uL Final   Performed at [facility], [place] [address] [place], [place] [address]  Lab on 07/13/2021  Component Date Value Ref Range Status   Fructosamine 07/13/2021 234  0 - 285 umol/L Final   Comment: Published reference interval for apparently healthy subjects between age 74 and 34 is 63 - 285 umol/L and in a poorly controlled diabetic population is 228 - 563 umol/L with a mean of 396 umol/L.    Sodium 07/13/2021 142  135 - 145 mEq/L Final   Potassium 07/13/2021 3.6  3.5 - 5.1 mEq/L Final   Chloride 07/13/2021 110  96 - 112 mEq/L Final   CO2 07/13/2021 26  19 - 32 mEq/L Final   Glucose, Bld 07/13/2021 140 (H)  70 - 99 mg/dL Final   BUN 07/13/2021 13  6 - 23 mg/dL Final   Creatinine, Ser 07/13/2021 0.70  0.40 - 1.50 mg/dL Final   GFR 07/13/2021 90.81  >60.00 mL/min Final   Calculated using the CKD-EPI Creatinine Equation (2021)   Calcium 07/13/2021 8.2 (L)  8.4 - 10.5 mg/dL Final    Allergies as of 07/19/2021       Reactions   Chlorhexidine    Unknown reaction   Enalapril    Other reaction(s): Unknown        Medication List        Accurate as of July 19, 2021  9:58 PM. If you have any questions, ask your nurse or doctor.          STOP taking these medications    Oxycodone HCl 10 MG Tabs Stopped by: [name], MD  TAKE these medications    (feeding supplement) PROSource Plus liquid Take 30 mLs by mouth 2 (two) times daily between meals.   acetaminophen 500 MG tablet Commonly known as: TYLENOL Take 1,000 mg by mouth daily as needed for moderate pain.   Calcium-Magnesium-Zinc 333-133-5 MG Tabs Take 1 tablet by mouth at bedtime.   famotidine 20 MG tablet Commonly known as: PEPCID Take 20 mg by mouth daily.   ferrous sulfate 325 (65 FE) MG tablet Take 1 tablet (325 mg total) by mouth daily.   FreeStyle Libre 2 Sensor Misc APPLY EVERY 14 DAYS, CHECK GLUCOSE BEFORE EACH MEAL, 2 HRS AFTER, IN THE MORNING AND  BEFORE BEDTIME   HumaLOG KwikPen 100 UNIT/ML KwikPen Generic drug: insulin lispro Inject 3-11 Units into the skin in the morning, at noon, and at bedtime. CBG 70 - 120: 0 units  CBG 121 - 150: 3 units  CBG 151 - 200: 4 units  CBG 201 - 250: 7 units  CBG 251 - 300: 11 units   CBG more than 350 call PCP   lidocaine 5 % Commonly known as: LIDODERM Place 2 patches onto the skin daily. Remove & Discard patch within 12 hours or as directed by MD   loperamide 1 MG/5ML solution Commonly known as: IMODIUM Take 2 mg by mouth daily as needed for diarrhea or loose stools.   magnesium 30 MG tablet Take 30 mg by mouth daily.   megestrol 400 MG/10ML suspension Commonly known as: MEGACE Take 10 mLs (400 mg total) by mouth 2 (two) times daily.   metoprolol tartrate 25 MG tablet Commonly known as: LOPRESSOR Take 1 tablet (25 mg total) by mouth 2 (two) times daily.   nystatin 100000 UNIT/ML suspension Commonly known as: MYCOSTATIN Take 5 mLs (500,000 Units total) by mouth 4 (four) times daily.   ondansetron 4 MG tablet Commonly known as: ZOFRAN Take 1 tablet (4 mg total) by mouth every 6 (six) hours as needed. for nausea   pantoprazole 40 MG tablet Commonly known as: PROTONIX Take 1 tablet (40 mg total) by mouth 2 (two) times daily before a meal.   prochlorperazine 10 MG tablet Commonly known as: COMPAZINE Take 1 tablet (10 mg total) by mouth every 6 (six) hours as needed.   rosuvastatin 10 MG tablet Commonly known as: CRESTOR Take 1 tablet (10 mg total) by mouth daily.   sucralfate 1 g tablet Commonly known as: Carafate Take 1 tablet (1 g total) by mouth 4 (four) times daily.   TIGER BALM ARTHRITIS RUB EX Apply 1 application topically 2 (two) times daily as needed (back pain).   Stephen Lynch FlexTouch 100 UNIT/ML FlexTouch Pen Generic drug: insulin degludec Inject 20 Units into the skin at bedtime.        Allergies:  Allergies  Allergen Reactions   Chlorhexidine      Unknown reaction   Enalapril     Other reaction(s): Unknown    Past Medical History:  Diagnosis Date   Cancer ([place])    prostate   Diabetes mellitus without complication ([place])    Dyslipidemia    Hypertension     Past Surgical History:  Procedure Laterality Date   ESOPHAGOGASTRODUODENOSCOPY (EGD) WITH PROPOFOL N/A 07/06/2021   Procedure: ESOPHAGOGASTRODUODENOSCOPY (EGD) WITH PROPOFOL;  Surgeon: Carol Ada, MD;  Location: [facility];  Service: Gastroenterology;  Laterality: N/A;   EYE SURGERY     HEMOSTASIS CLIP PLACEMENT  07/06/2021   Procedure: HEMOSTASIS CLIP PLACEMENT;  Surgeon: Carol Ada, MD;  Location:  [facility]OS[facility];  Service: Gastroenterology;;   HOT HEMOSTASIS N/A 07/06/2021   Procedure: HOT HEMOSTASIS (ARGON PLASMA COAGULATION/BICAP);  Surgeon: Carol Ada, MD;  Location: [facility];  Service: Gastroenterology;  Laterality: N/A;   IR IMAGING GUIDED PORT INSERTION  06/12/2020   SCLEROTHERAPY  07/06/2021   Procedure: Clide Deutscher;  Surgeon: Carol Ada, MD;  Location: [facility];  Service: Gastroenterology;;    Family History  Problem Relation Age of Onset   CAD Mother 20   Other Father 50       age   Other Brother 37       unknown cause    Social History:  reports that he has quit smoking. His smoking use included cigarettes. He has a 7.50 pack-year smoking history. He has never used smokeless tobacco. He reports that he does not currently use alcohol. He reports that he does not currently use drugs.   Review of Systems   Lipid history: Has been treated with Crestor by PCP No recent labs available   Lab Results  Component Value Date   CHOL 142 07/18/2020   HDL 53.80 07/18/2020   LDLCALC 72 07/18/2020   TRIG 80.0 07/18/2020   CHOLHDL 3 07/18/2020           Hypertension: Has been present variably, previously on enalapril, managed by PCP  BP Readings from Last 3 Encounters:  07/19/21 (!) 158/88  07/18/21 138/63  07/10/21 131/72    Most  recent eye exam was in 2022  Most recent foot exam: 5/22  Currently known complications of diabetes: Erectile dysfunction  MULTINODULAR goiter:  He has 3 abnormal lesions and radiologist has recommended biopsy of 2 of the solid nodules with highest TI-RADS 4 Location: Right; Inferior   Maximum size: 2.3 cm; Other 2 dimensions: 2.1 x 2.1 cm Composition: solid/almost completely solid (2)   Echogenicity: hypoechoic (2)    Biopsy has been recommended but he wants to wait till his prostate cancer treatment is complete  Lab Results  Component Value Date   TSH 0.76 07/18/2020     LABS:   Lab Results  Component Value Date   WBC 7.9 07/18/2021   HGB 8.4 (L) 07/18/2021   HCT 25.8 (L) 07/18/2021   MCV 93.5 07/18/2021   PLT 80 (L) 07/18/2021     Physical Examination:  BP (!) 158/88   Pulse (!) 57   Ht '6\' 1"'$  (1.854 m)   Wt 208 lb (94.3 kg)   SpO2 97%   BMI 27.44 kg/m  O2 saturation 97%      ASSESSMENT:  Diabetes type 2 on insulin  See history of present illness for detailed discussion of current diabetes management, blood sugar patterns and problems identified  He is on basal bolus insulin insulin  Monitoring is done with the freestyle libre but information is only available for the last week  His A1c last was 8.4 but now with since he stopped his infusions and steroids as well as continued decrease in appetite his insulin requirement has gone down significantly Also he is having only minimal hypoglycemia with only 23% readings above target  Since his long-term prognosis is poor we will not aggressively treat him  He needs to likely reduce his basal insulin to avoid early morning hypoglycemia which he has at times Most of his management is done by his wife  PLAN:    Stephen Lynch 16 units daily and let us know if his morning sugars are again getting low  Humalog 5 Units after any  meal but skip when he is not eating May take 5 to 10 units when blood sugars are over  250 without meals Continue using the freestyle libre sensor He will try to maintain his nutrition with protein/nutritional supplements  Follow-up in 2 months unless having difficulty with his control  Total visit time including counseling = 30 Minutes  Patient Instructions  Tresiba 16  Humalog 5 Units after any meal        [name] 07/19/2021, 9:58 PM   Note: This office note was prepared with Dragon voice recognition system technology. Any transcriptional errors that result from this process are unintentional.

## 2021-07-20 ENCOUNTER — Inpatient Hospital Stay: Payer: Medicare Other

## 2021-07-20 ENCOUNTER — Telehealth: Payer: Self-pay

## 2021-07-20 ENCOUNTER — Inpatient Hospital Stay: Payer: Medicare Other | Admitting: Nurse Practitioner

## 2021-07-20 ENCOUNTER — Encounter: Payer: Self-pay | Admitting: Endocrinology

## 2021-07-20 DIAGNOSIS — C7931 Secondary malignant neoplasm of brain: Secondary | ICD-10-CM | POA: Diagnosis not present

## 2021-07-20 DIAGNOSIS — E119 Type 2 diabetes mellitus without complications: Secondary | ICD-10-CM | POA: Diagnosis not present

## 2021-07-20 DIAGNOSIS — C61 Malignant neoplasm of prostate: Secondary | ICD-10-CM | POA: Diagnosis not present

## 2021-07-20 DIAGNOSIS — R634 Abnormal weight loss: Secondary | ICD-10-CM | POA: Diagnosis not present

## 2021-07-20 DIAGNOSIS — B3781 Candidal esophagitis: Secondary | ICD-10-CM | POA: Diagnosis not present

## 2021-07-20 DIAGNOSIS — R531 Weakness: Secondary | ICD-10-CM | POA: Diagnosis not present

## 2021-07-20 NOTE — Telephone Encounter (Signed)
Patient's significant other called to discuss what next steps for patient should be. She requested that I review information from Dr. Hazeline Junker office visit from 07/18/21. Milas Gain shared that she feels like they are giving up but understands that the chemo is not working but was questioning if they should take a break from chemo and try again. Explained role of Palliative Nurse Practitioner at [facility]. Patient had appointment today with NP but did not come for appointment. Will route message to Alda Lea to request that pt be contacted by phone for goals of care.

## 2021-07-21 DIAGNOSIS — C7931 Secondary malignant neoplasm of brain: Secondary | ICD-10-CM | POA: Diagnosis not present

## 2021-07-21 DIAGNOSIS — D630 Anemia in neoplastic disease: Secondary | ICD-10-CM | POA: Diagnosis not present

## 2021-07-21 DIAGNOSIS — B3781 Candidal esophagitis: Secondary | ICD-10-CM | POA: Diagnosis not present

## 2021-07-21 DIAGNOSIS — C787 Secondary malignant neoplasm of liver and intrahepatic bile duct: Secondary | ICD-10-CM | POA: Diagnosis not present

## 2021-07-21 DIAGNOSIS — K250 Acute gastric ulcer with hemorrhage: Secondary | ICD-10-CM | POA: Diagnosis not present

## 2021-07-21 DIAGNOSIS — R634 Abnormal weight loss: Secondary | ICD-10-CM | POA: Diagnosis not present

## 2021-07-21 DIAGNOSIS — J449 Chronic obstructive pulmonary disease, unspecified: Secondary | ICD-10-CM | POA: Diagnosis not present

## 2021-07-21 DIAGNOSIS — K254 Chronic or unspecified gastric ulcer with hemorrhage: Secondary | ICD-10-CM | POA: Diagnosis not present

## 2021-07-21 DIAGNOSIS — C61 Malignant neoplasm of prostate: Secondary | ICD-10-CM | POA: Diagnosis not present

## 2021-07-21 DIAGNOSIS — I10 Essential (primary) hypertension: Secondary | ICD-10-CM | POA: Diagnosis not present

## 2021-07-21 DIAGNOSIS — C7951 Secondary malignant neoplasm of bone: Secondary | ICD-10-CM | POA: Diagnosis not present

## 2021-07-21 DIAGNOSIS — C7989 Secondary malignant neoplasm of other specified sites: Secondary | ICD-10-CM | POA: Diagnosis not present

## 2021-07-21 DIAGNOSIS — E785 Hyperlipidemia, unspecified: Secondary | ICD-10-CM | POA: Diagnosis not present

## 2021-07-21 DIAGNOSIS — G9340 Encephalopathy, unspecified: Secondary | ICD-10-CM | POA: Diagnosis not present

## 2021-07-23 ENCOUNTER — Telehealth: Payer: Self-pay

## 2021-07-23 ENCOUNTER — Other Ambulatory Visit: Payer: Self-pay | Admitting: *Deleted

## 2021-07-23 DIAGNOSIS — C7951 Secondary malignant neoplasm of bone: Secondary | ICD-10-CM | POA: Diagnosis not present

## 2021-07-23 DIAGNOSIS — C787 Secondary malignant neoplasm of liver and intrahepatic bile duct: Secondary | ICD-10-CM | POA: Diagnosis not present

## 2021-07-23 DIAGNOSIS — C7931 Secondary malignant neoplasm of brain: Secondary | ICD-10-CM | POA: Diagnosis not present

## 2021-07-23 DIAGNOSIS — B3781 Candidal esophagitis: Secondary | ICD-10-CM | POA: Diagnosis not present

## 2021-07-23 DIAGNOSIS — C7989 Secondary malignant neoplasm of other specified sites: Secondary | ICD-10-CM | POA: Diagnosis not present

## 2021-07-23 DIAGNOSIS — C61 Malignant neoplasm of prostate: Secondary | ICD-10-CM | POA: Diagnosis not present

## 2021-07-23 NOTE — Telephone Encounter (Signed)
Attempted to reach out to pt wife regarding GOC and to check in with pt and family. No answer, LVM and call back number

## 2021-07-23 NOTE — Patient Outreach (Signed)
[place] [facility]) Care Management Telephonic RN Care Manager Note   07/23/2021 Name:  Stephen Lynch MRN:  [id number] DOB:  [birth year]  Summary: Stephen Lynch call placed to member's significant other.  Denies any urgent concerns, encouraged to contact this care manager with questions.     Subjective: Stephen Lynch is an 76 y.o. year old male who is a primary patient of [place]. The care management team was consulted for assistance with care management and/or care coordination needs.    Telephonic RN Care Manager completed Telephone Visit today.  Objective:   Medications Reviewed Today     Reviewed by Cinda Quest, [place] (Certified Medical Assistant) on 07/19/21 at 1516  Med List Status: <None>   Medication Order Taking? Sig Documenting Provider Last Dose Status Informant  acetaminophen (TYLENOL) 500 MG tablet [id number] Yes Take 1,000 mg by mouth daily as needed for moderate pain. [provider] Taking Active Spouse/Significant Other  Calcium-Magnesium-Zinc [phone] MG TABS [id number] Yes Take 1 tablet by mouth at bedtime. [provider] Taking Active Spouse/Significant Other  Continuous Blood Gluc Sensor (FREESTYLE LIBRE 2 SENSOR) MISC [id number] Yes APPLY EVERY 14 DAYS, CHECK GLUCOSE BEFORE EACH MEAL, 2 HRS AFTER, IN THE MORNING AND BEFORE BEDTIME [name], MD Taking Active Spouse/Significant Other  famotidine (PEPCID) 20 MG tablet [id number] Yes Take 20 mg by mouth daily. [provider] Taking Active Spouse/Significant Other  ferrous sulfate 325 (65 FE) MG tablet [id number] Yes Take 1 tablet (325 mg total) by mouth daily. Regalado, [name], MD Taking Active Spouse/Significant Other  HUMALOG KWIKPEN 100 UNIT/ML KwikPen [id number] Yes Inject 3-11 Units into the skin in the morning, at noon, and at bedtime. CBG 70 - 120: 0 units  CBG 121 - 150: 3 units  CBG 151 - 200: 4 units  CBG 201 - 250: 7 units  CBG 251 - 300: 11  units   CBG more than 350 call PCP Regalado, Belkys A, MD Taking Active Spouse/Significant Other  lidocaine (LIDODERM) 5 % [id number] Yes Place 2 patches onto the skin daily. Remove & Discard patch within 12 hours or as directed by MD Flora Lipps, MD Taking Active Spouse/Significant Other  loperamide (IMODIUM) 1 MG/5ML solution [id number] Yes Take 2 mg by mouth daily as needed for diarrhea or loose stools. [provider] Taking Active Spouse/Significant Other  magnesium 30 MG tablet [id number] Yes Take 30 mg by mouth daily. [provider] Taking Active Spouse/Significant Other  megestrol (MEGACE) 400 MG/10ML suspension [id number] Yes Take 10 mLs (400 mg total) by mouth 2 (two) times daily. Elmarie Shiley, MD Taking Active Spouse/Significant Other  Menthol-Camphor (TIGER BALM ARTHRITIS RUB EX) [id number] Yes Apply 1 application topically 2 (two) times daily as needed (back pain). [provider] Taking Active Spouse/Significant Other  metoprolol tartrate (LOPRESSOR) 25 MG tablet [id number] Yes Take 1 tablet (25 mg total) by mouth 2 (two) times daily. Kathie Dike, MD Taking Active   Nutritional Supplements (,FEEDING SUPPLEMENT, PROSOURCE PLUS) liquid [id number] Yes Take 30 mLs by mouth 2 (two) times daily between meals. Regalado, [name], MD Taking Active Spouse/Significant Other  nystatin (MYCOSTATIN) 100000 UNIT/ML suspension [id number] Yes Take 5 mLs (500,000 Units total) by mouth 4 (four) times daily. Regalado, [name], MD Taking Active Spouse/Significant Other  ondansetron (ZOFRAN) 4 MG tablet [id number] Yes Take 1 tablet (4 mg total) by mouth every 6 (six) hours as needed. for nausea [name], MD Taking Active Spouse/Significant Other  Oxycodone HCl 10 MG TABS  [id number] No Take 1 tablet (10 mg total) by mouth every 4 (four) hours as needed.  Patient not taking: Reported on 07/19/2021   [name], MD Not Taking Active Spouse/Significant Other   pantoprazole (PROTONIX) 40 MG tablet [id number] Yes Take 1 tablet (40 mg total) by mouth 2 (two) times daily before a meal. Kathie Dike, MD Taking Active   prochlorperazine (COMPAZINE) 10 MG tablet [id number] Yes Take 1 tablet (10 mg total) by mouth every 6 (six) hours as needed. [name], MD Taking Active Spouse/Significant Other  rosuvastatin (CRESTOR) 10 MG tablet [id number] No Take 1 tablet (10 mg total) by mouth daily.  Patient not taking: Reported on 07/19/2021   [name], MD Not Taking Active Spouse/Significant Other  sucralfate (CARAFATE) 1 g tablet [id number] Yes Take 1 tablet (1 g total) by mouth 4 (four) times daily. Kathie Dike, MD Taking Active   TRESIBA FLEXTOUCH 100 UNIT/ML FlexTouch Pen [id number] Yes Inject 20 Units into the skin at bedtime. Regalado, [name], MD Taking Active Spouse/Significant Other             SDOH:  (Social Determinants of Health) assessments and interventions performed:     Care Plan  Review of patient past medical history, allergies, medications, health status, including review of consultants reports, laboratory and other test data, was performed as part of comprehensive evaluation for care management services.   Care Plan : RNCM Plan of Care  Updates made by [name], RN since 07/23/2021 12:00 AM     Problem: Knoweldge deficit regarding management of health conditions, prostate cancer and DM   Priority: High     Long-Range Goal: Member will show adequate management of chronic conditions, Prostate Cancer and DM (goal extended due to not being on track) Completed 07/23/2021  Start Date: 10/27/2020  Expected End Date: 10/27/2021  This Visit's Progress: Not on track  Recent Progress: Not on track  Priority: High  Note:   Current Barriers:  Knowledge Deficits related to plan of care for management of DMII and Prostate cancer  Chronic Disease Management support and education needs related to DMII and Prostate  cancer Transportation barriers Non-adherence to prescribed medication regimen  RNCM Clinical Goal(s):  Patient will verbalize understanding of plan for management of DMII and Prostate cancer as evidenced by no hospital admissions take all medications exactly as prescribed and will call provider for medication related questions as evidenced by reported adherence by member and wife    demonstrate understanding of rationale for each prescribed medication as evidenced by Insulin [name])    attend all scheduled medical appointments: PCP, oncology, and endocrinology as evidenced by record of attendance at provider offices        continue to work with RN Care Manager and/or Social Worker to address care management and care coordination needs related to DMII and prostate cancer as evidenced by adherence to CM Team Scheduled appointments     demonstrate ongoing self health care management ability to manage DM, including diet, medication management, and activity as evidenced by     through collaboration with RN Care manager, provider, and care team.   Interventions: Inter-disciplinary care team collaboration (see longitudinal plan of care) Evaluation of current treatment plan related to  self management and patient's adherence to plan as established by provider   Diabetes:  (Status: Goal on track: NO.) Lab Results  Component Value Date   HGBA1C 8.4 (H) 05/14/2021  Assessed patient's understanding of A1c goal: <7% Provided education to patient  about basic DM disease process; Reviewed medications with patient and discussed importance of medication adherence;        Reviewed prescribed diet with patient low sugars, low carbs; Reviewed scheduled/upcoming provider appointments including: Endocrinology in 08-19-21;         Advised patient, providing education and rationale, to check cbg 2-3 times a day and record        Assessed social determinant of health barriers;         Oncology:  (Status: Goal  on track:  NO.) Assessment of understanding of oncology diagnosis:  Assessed patient understanding of cancer diagnosis and recommended treatment plan, Reviewed upcoming provider appointments and treatment appointments, and Assessed support system. Has consistent/reliable family or other support: Yes PSA on 4/24 was 499 PSA on 5/16 was 559   Update 11/11 - Significant other report member has been "out of it" as his cancer is progressing and requiring more pain medication for relief.  He has been seen by [facility], now active with the home based palliative care team.  Report he has not been able to stay mobile due to the pain, decreased appetite, drinking boost for nutritional support.  Blood sugars have been elevated as he has not been taking medication appropriately due to not being alert enough.  She has been calls to endocrinology for assistance, changes in insulin noted.  She is not happy with their experience at current PCP, looking to change providers however state it is now difficult to get member out of the house on some days.  Discussed having provider visit the home, advised of the [facility] program, she agrees to referral.    Update 11/21 - Spoke to significant other, confirms that [facility] made home visit on Friday 11/18.  Call placed to [facility] Cecille Rubin), confirmed that they will now be acting as member's primary provider.  They will make another home visit tomorrow to monitor member's status as member has recently had round of chemo.  She continues to have a hard time with managing member's blood sugars, follow labs and appointment with endocrinology scheduled for the next couple weeks.    Patient Goals/Self-Care Activities: Patient will self administer medications as prescribed Patient will attend all scheduled provider appointments Patient will call pharmacy for medication refills Patient will call provider office for new concerns or questions    Update 12/13 -  Significant other state member is doing better than he has been the last couple weeks.  He has restarted chemo, PSA has decreased from 382 to 167.  Follow up with oncology on 12/29.  His appetite has increased however they were still having intermittent trouble with diabetes management.  Significant other called endocrinology office to report hypoglycemic episodes, lunch time insulin was decreased.  Since, readings have ranged from 160's-240.  Follow up with endocrinology on 1/5.   Update 1/10 - Member report he is feeling "much better."  Acknowledges that he was not doing well a few months ago, has progressed with the help of chemo and PT.  Expresses gratitude for this care manager assisting significant other in securing new primary provider.  PSA levels have increased slightly but remain lower than previously (currently 218).  Next oncology appointment scheduled for 1/19.  Was seen by endocrinology on 1/5, most recent A1C was 6, will continue current treatment.  Blood sugar today was 120, will follow up with endocrinology on 4/5.   Update 2/7 [facility] he is not feeling well today, having pain over the  last couple weeks.  Rate today at 3/10, generalized.  He is taking Oxycodone with relief, also has Lidocaine patch every 12 hours.  Does report he was able to get up to cook breakfast this morning, which is better then the last few days.  Report blood glucose was about 200 today, but this was after breakfast. Discussed monitoring prior to eating and reviewed insuline doses.  Has follow up with endocrinology on 4/5.  Continue to have chemotherapy sessions however PSA increased to 463 on 1/19.  He will have his next treatment tomorrow and will see radiology oncology on Friday.  Remains active with [facility] for PCP services, state last visit was last Friday.  Per chart, he has also had home health ordered however he declined to have PT due to pain.  Encouraged to participate with agency to promote optimal  health and strength.   Update 3/2 - Member report he has been doing better.  State his pain is not as bad, using Oxycodone and lidocaine patches.  He has started radiation treatments in addition to his chemo treatments.  Had daily radiation for the last 2 weeks, will follow up with radiology oncology 3/29 to determine if he will need additional sessions.  Scheduled for injection at cancer center tomorrow, at which time he will have get an updated treatment schedule.  Does not know what his blood sugar is today, state sensor came off during the night but he has called in for a refill.  Blood sugar was 140 yesterday.  Remains slightly confused in regards to insulin treatment, reviewed orders and importance of adhering to diet.  Will follow up with endocrinology on 4/5, repeat A1C on 4/3.  He is unsure when next home visit with [facility] is, significant other unavailable to confirm appointment but member state it is next month.   Update 4/11 - Spoke with member and fiance, state member is "ok."  He has continued to have complications of his metastatic cancer, such as weakness, pain, and some forgetfulness.  They are still hopeful of a recovery, next chemo session scheduled for 4/20.  Member active palliative care, last visit was on 3/9, fiance report they will revisit the end of the month.  He has continued to lose weight due to decreased appetite.  Member report he is mostly only able to keep down mashed potatoes.  Encouraged to consider nutritional supplements such as Glucerna.  Was seen by endocrinology on 4/5, A1C increased greater then 8%.  [name] and Humalog doses increased however fiance state member has been experiencing some low readings since the increase.  Readings have ranged from a low of 65 to a high of greater than 300.  Today's reading was 152 this morning.  Fiance will discuss concern with endocrinology (follow up scheduled for June) as well as with [facility] as they are scheduled to  visit the home this afternoon.     Update 4/28 - Member's fiance state he is still not doing that well since discharge from hospital.  He is still having bouts of confusion and weakness.  Unclear if this is from lingering affects of the UTI or side effects of chemo which he received on 4/24.  Primary Provider/[facility] was in the home yesterday for hospital follow up, home health evaluation has been done this week and therapy sessions will resume next week.  He was supposed to be seen by palliative care last week but was hospitalized, they have rescheduled for the within the next  couple weeks.  Blood sugars remain elevated at time, range 150-400 since discharge, insulin doses adjusted by collaboration of endocrinology and [facility].  Member and fiance are still hopeful for improvement with cancer treatment, next infusion scheduled for next week.   Update 5/9 - Fiance report member is doing better.  He was given an additional course of antibiotic to treat the UTI, feel this has helped a lot although member is still slightly confused.  This may be caused by complications of cancer treatment and/or brain mets.  PSA remains elevated at 499, plan is to continue treatment.  [facility] in the home with weekly primary care assessments and collaboration with oncology and endocrinology for plan of care.  Palliative care remains involved as well.   Update 5/31 - Admitted to hospital 5/25-5/30 for GI bleed.  PRBCs were given and Endoscopy was done, per notes bleeding ulcer was found.  Per significant other, [name], it was "lasered and clamped."  She report no further bleeding since procedure and discharge.  He will follow up with GI.  She will also notify home health as well as PCP to restart services and make home visit.  Per chart, member's quality of life was discussed, they wanted to continue treatment but member was made a DNR.  [facility] palliative care will see member in the home on 6/19.   Endocrinology follow up on 6/2 for labs and on 6/6 for office visit.  [name] report member is "better" since discharge.  Continues to work to improve strength.  Discussed overall decline in member's status, she state they are "hopeful, taking it one day at a time."   Update 6/12 - Member now active with hospice, initial visit completed today.  Will remain with [facility] for primary provider services.  Case will be closed to [facility].       Plan:  No further follow up required: Case closed.  [name], RN, MSN, [place] Manager [phone]

## 2021-07-24 DIAGNOSIS — C7931 Secondary malignant neoplasm of brain: Secondary | ICD-10-CM | POA: Diagnosis not present

## 2021-07-24 DIAGNOSIS — C61 Malignant neoplasm of prostate: Secondary | ICD-10-CM | POA: Diagnosis not present

## 2021-07-24 DIAGNOSIS — C787 Secondary malignant neoplasm of liver and intrahepatic bile duct: Secondary | ICD-10-CM | POA: Diagnosis not present

## 2021-07-24 DIAGNOSIS — C7989 Secondary malignant neoplasm of other specified sites: Secondary | ICD-10-CM | POA: Diagnosis not present

## 2021-07-24 DIAGNOSIS — C7951 Secondary malignant neoplasm of bone: Secondary | ICD-10-CM | POA: Diagnosis not present

## 2021-07-24 DIAGNOSIS — B3781 Candidal esophagitis: Secondary | ICD-10-CM | POA: Diagnosis not present

## 2021-07-25 DIAGNOSIS — C7951 Secondary malignant neoplasm of bone: Secondary | ICD-10-CM | POA: Diagnosis not present

## 2021-07-25 DIAGNOSIS — B3781 Candidal esophagitis: Secondary | ICD-10-CM | POA: Diagnosis not present

## 2021-07-25 DIAGNOSIS — C7931 Secondary malignant neoplasm of brain: Secondary | ICD-10-CM | POA: Diagnosis not present

## 2021-07-25 DIAGNOSIS — C61 Malignant neoplasm of prostate: Secondary | ICD-10-CM | POA: Diagnosis not present

## 2021-07-25 DIAGNOSIS — C787 Secondary malignant neoplasm of liver and intrahepatic bile duct: Secondary | ICD-10-CM | POA: Diagnosis not present

## 2021-07-25 DIAGNOSIS — C7989 Secondary malignant neoplasm of other specified sites: Secondary | ICD-10-CM | POA: Diagnosis not present

## 2021-07-26 DIAGNOSIS — C7931 Secondary malignant neoplasm of brain: Secondary | ICD-10-CM | POA: Diagnosis not present

## 2021-07-26 DIAGNOSIS — B3781 Candidal esophagitis: Secondary | ICD-10-CM | POA: Diagnosis not present

## 2021-07-26 DIAGNOSIS — C7989 Secondary malignant neoplasm of other specified sites: Secondary | ICD-10-CM | POA: Diagnosis not present

## 2021-07-26 DIAGNOSIS — C787 Secondary malignant neoplasm of liver and intrahepatic bile duct: Secondary | ICD-10-CM | POA: Diagnosis not present

## 2021-07-26 DIAGNOSIS — C61 Malignant neoplasm of prostate: Secondary | ICD-10-CM | POA: Diagnosis not present

## 2021-07-26 DIAGNOSIS — C7951 Secondary malignant neoplasm of bone: Secondary | ICD-10-CM | POA: Diagnosis not present

## 2021-07-27 ENCOUNTER — Telehealth: Payer: Self-pay | Admitting: Oncology

## 2021-07-27 NOTE — Telephone Encounter (Signed)
Called patient regarding upcoming appointment, spoke with patient's spouse. Patient is deceased, informed providers staff and future appointments have been cancelled.

## 2021-07-30 ENCOUNTER — Other Ambulatory Visit: Payer: Medicare Other | Admitting: Hospice

## 2021-08-08 ENCOUNTER — Inpatient Hospital Stay: Payer: Medicare Other

## 2021-08-08 ENCOUNTER — Inpatient Hospital Stay: Payer: Medicare Other | Admitting: Oncology

## 2021-08-11 DEATH — deceased

## 2021-10-17 ENCOUNTER — Ambulatory Visit: Payer: Medicare Other | Admitting: Endocrinology

## 2023-04-13 IMAGING — CT CT HEAD W/O CM
3 series · 14 of 47 positions shown, 16 images · non-contrast
Comparison: None.

CLINICAL DATA: Altered mental status.

EXAM:
CT HEAD WITHOUT CONTRAST
TECHNIQUE: Contiguous axial images were obtained from the base of the skull
through the vertex without intravenous contrast.

[Series 2: head wo · axial · 0.52mm/px · z∈[+1248,+1378]mm · 8 of 32 slices shown, 10 images]
[im 3/32  brain]
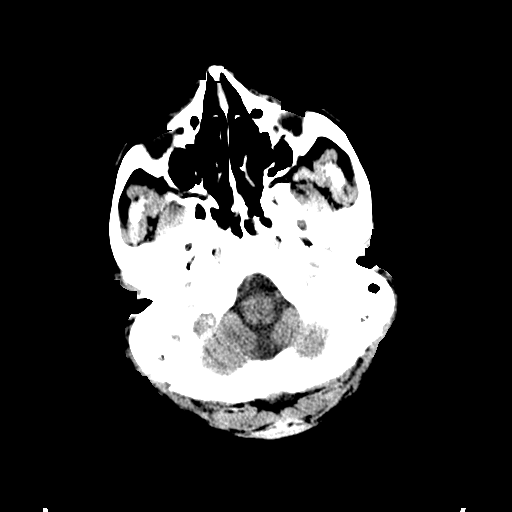
[im 3/32  bone]
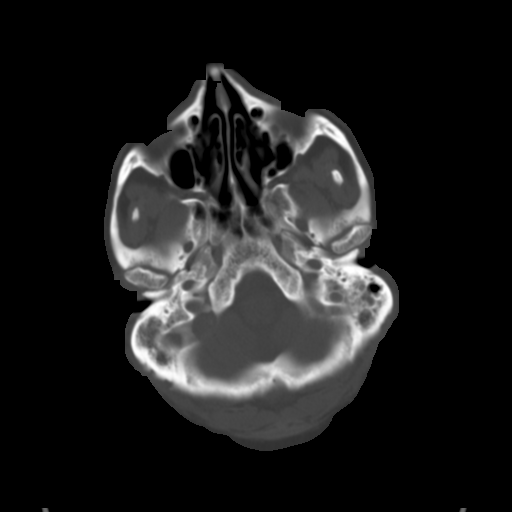
[im 7/32  brain]
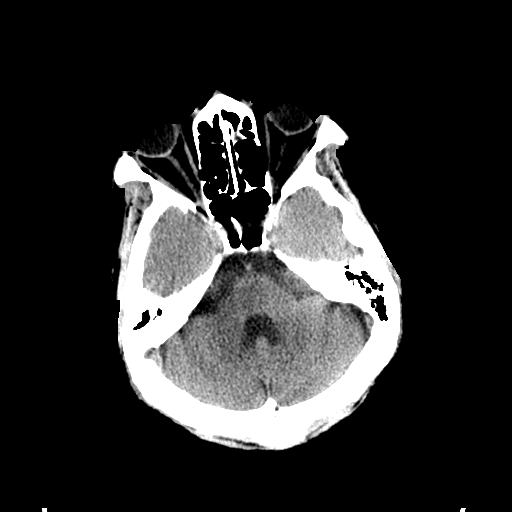
[im 10/32  brain]
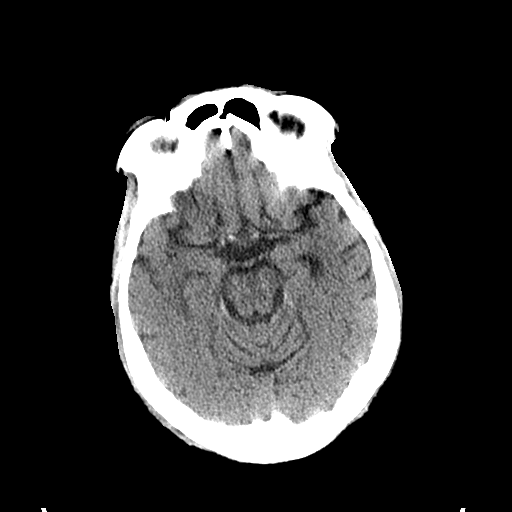
[im 14/32  brain]
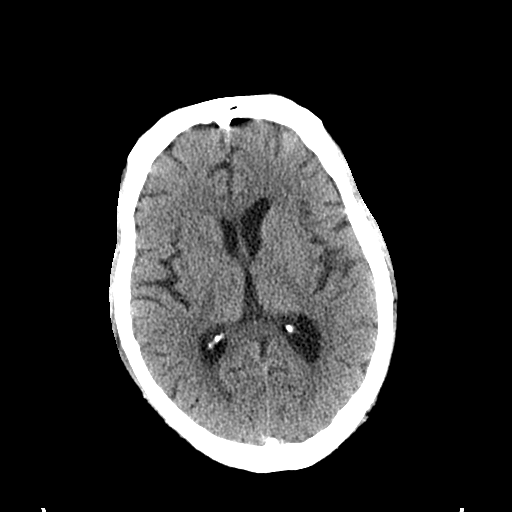
[im 18/32  brain]
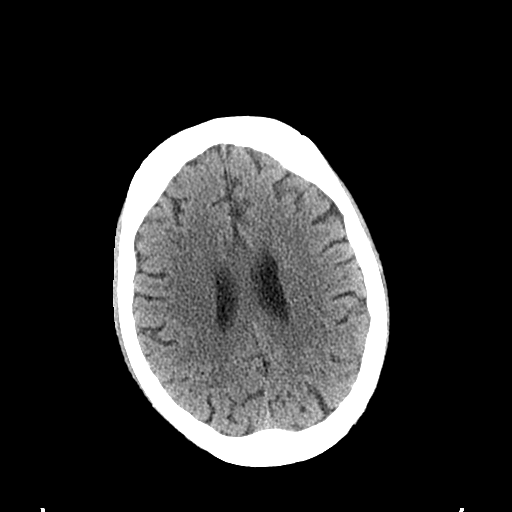
[im 18/32  bone]
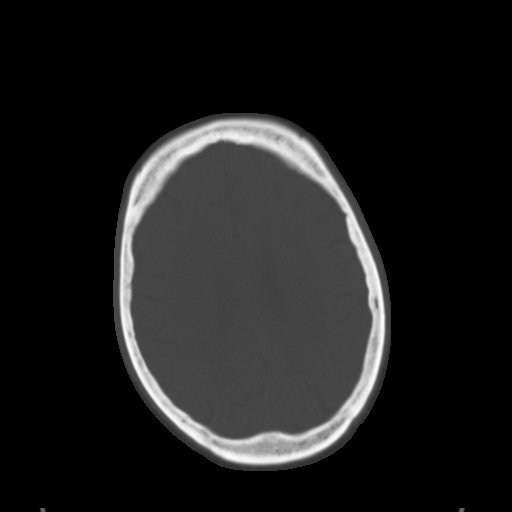
[im 22/32  brain]
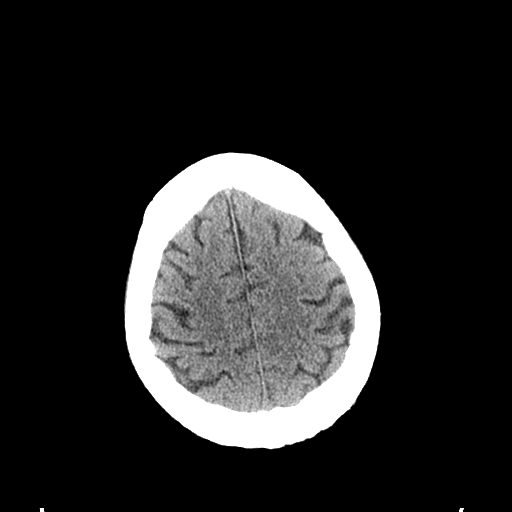
[im 25/32  brain]
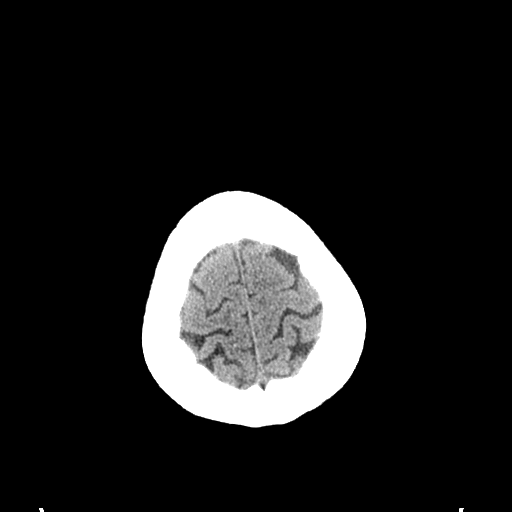
[im 29/32  brain]
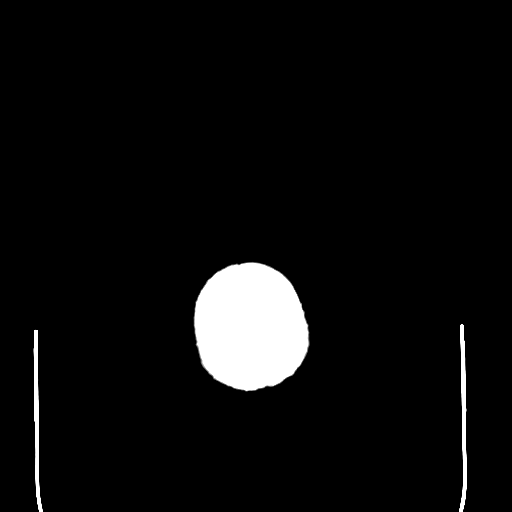

[Series 4: coronal soft tissue · coronal · 0.33mm/px · 3 of 70 slices shown]
[im 24/70  brain]
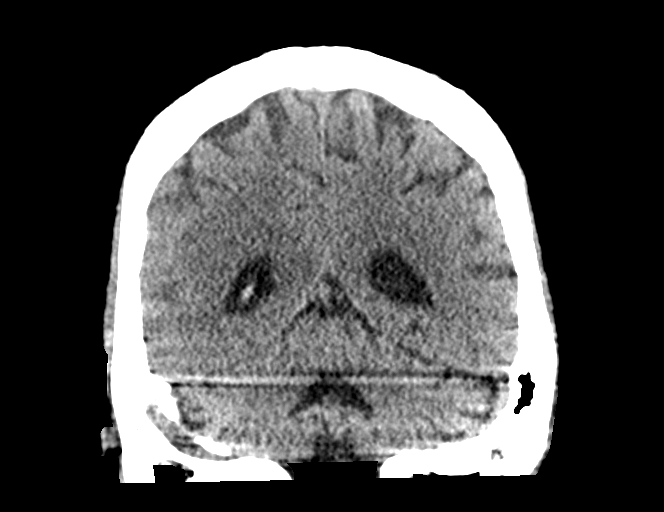
[im 31/70  brain]
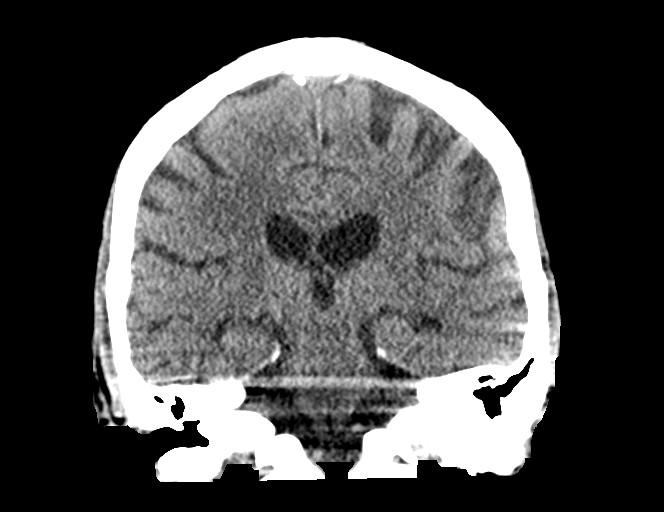
[im 39/70  brain]
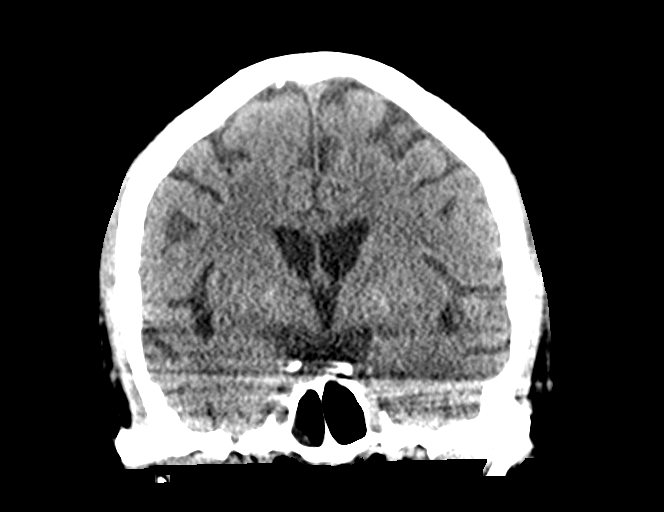

[Series 5: sagittal soft tissue · sagittal · 0.33mm/px · 3 of 53 slices shown]
[im 18/53  brain]
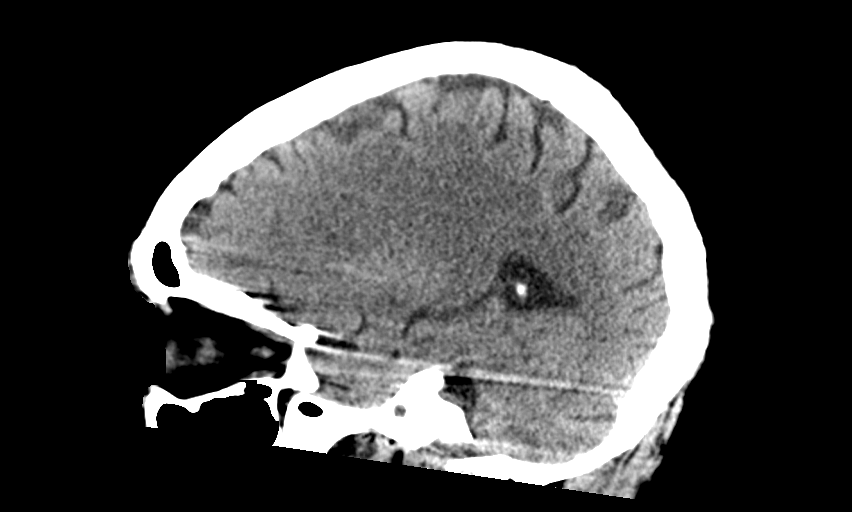
[im 27/53  brain]
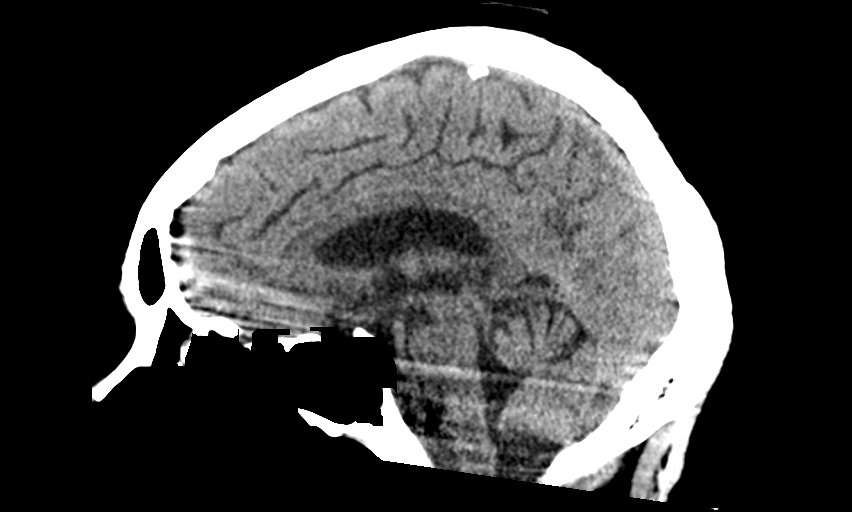
[im 35/53  brain]
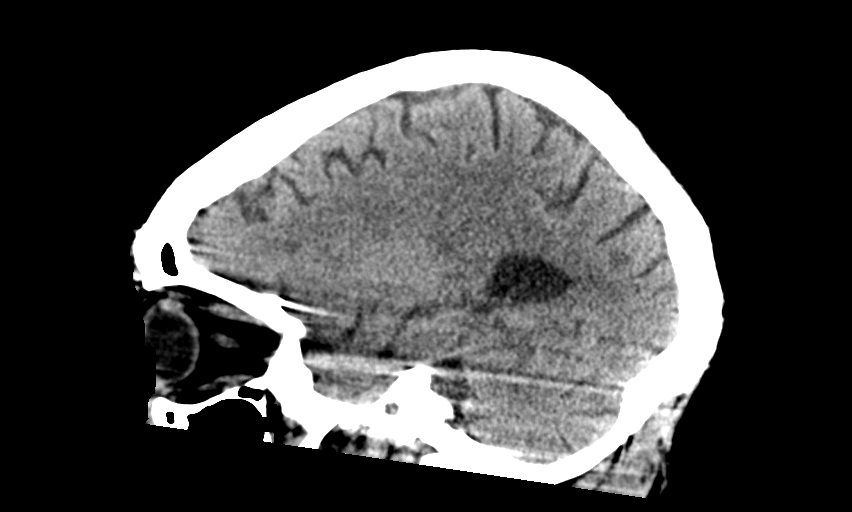

[14 of 47 positions shown; findings below may reference images not displayed]

FINDINGS: Brain: The ventricles and sulci appropriate size for patient's age.
The gray-white matter discrimination is preserved. There is no acute
intracranial hemorrhage. No mass effect midline shift. No
extra-axial fluid collection.

Vascular: No hyperdense vessel or unexpected calcification.

Skull: Normal. Negative for fracture or focal lesion.

Sinuses/Orbits: No acute finding.

Other: None
IMPRESSION: Unremarkable noncontrast CT of the brain.
